# Patient Record
Sex: Female | Born: 1935 | Race: White | Hispanic: No | Marital: Married | Smoking: Former smoker
Health system: Southern US, Community
[De-identification: ages and names within clinical notes are randomized; demographics above are authoritative.]

## PROBLEM LIST (undated history)

## (undated) DIAGNOSIS — I422 Other hypertrophic cardiomyopathy: Secondary | ICD-10-CM

## (undated) DIAGNOSIS — Z8489 Family history of other specified conditions: Secondary | ICD-10-CM

## (undated) DIAGNOSIS — Z8739 Personal history of other diseases of the musculoskeletal system and connective tissue: Secondary | ICD-10-CM

## (undated) DIAGNOSIS — J42 Unspecified chronic bronchitis: Secondary | ICD-10-CM

## (undated) DIAGNOSIS — K602 Anal fissure, unspecified: Secondary | ICD-10-CM

## (undated) DIAGNOSIS — I509 Heart failure, unspecified: Secondary | ICD-10-CM

## (undated) DIAGNOSIS — I452 Bifascicular block: Secondary | ICD-10-CM

## (undated) DIAGNOSIS — N183 Chronic kidney disease, stage 3 unspecified: Secondary | ICD-10-CM

## (undated) DIAGNOSIS — F418 Other specified anxiety disorders: Secondary | ICD-10-CM

## (undated) DIAGNOSIS — I4891 Unspecified atrial fibrillation: Secondary | ICD-10-CM

## (undated) DIAGNOSIS — E785 Hyperlipidemia, unspecified: Secondary | ICD-10-CM

## (undated) DIAGNOSIS — E039 Hypothyroidism, unspecified: Secondary | ICD-10-CM

## (undated) DIAGNOSIS — I8390 Asymptomatic varicose veins of unspecified lower extremity: Secondary | ICD-10-CM

## (undated) DIAGNOSIS — Z95810 Presence of automatic (implantable) cardiac defibrillator: Secondary | ICD-10-CM

## (undated) DIAGNOSIS — C4491 Basal cell carcinoma of skin, unspecified: Secondary | ICD-10-CM

## (undated) DIAGNOSIS — I10 Essential (primary) hypertension: Secondary | ICD-10-CM

## (undated) DIAGNOSIS — D126 Benign neoplasm of colon, unspecified: Secondary | ICD-10-CM

## (undated) DIAGNOSIS — R001 Bradycardia, unspecified: Secondary | ICD-10-CM

## (undated) DIAGNOSIS — J690 Pneumonitis due to inhalation of food and vomit: Secondary | ICD-10-CM

## (undated) DIAGNOSIS — K5790 Diverticulosis of intestine, part unspecified, without perforation or abscess without bleeding: Secondary | ICD-10-CM

## (undated) DIAGNOSIS — G43909 Migraine, unspecified, not intractable, without status migrainosus: Secondary | ICD-10-CM

## (undated) HISTORY — DX: Benign neoplasm of colon, unspecified: D12.6

## (undated) HISTORY — DX: Unspecified atrial fibrillation: I48.91

## (undated) HISTORY — DX: Hyperlipidemia, unspecified: E78.5

## (undated) HISTORY — PX: COLONOSCOPY: SHX174

## (undated) HISTORY — DX: Presence of automatic (implantable) cardiac defibrillator: Z95.810

## (undated) HISTORY — PX: CATARACT EXTRACTION W/ INTRAOCULAR LENS  IMPLANT, BILATERAL: SHX1307

## (undated) HISTORY — DX: Hypothyroidism, unspecified: E03.9

## (undated) HISTORY — DX: Bradycardia, unspecified: R00.1

## (undated) HISTORY — DX: Essential (primary) hypertension: I10

## (undated) HISTORY — DX: Migraine, unspecified, not intractable, without status migrainosus: G43.909

## (undated) HISTORY — DX: Diverticulosis of intestine, part unspecified, without perforation or abscess without bleeding: K57.90

## (undated) HISTORY — PX: VAGINAL HYSTERECTOMY: SUR661

## (undated) HISTORY — DX: Anal fissure, unspecified: K60.2

## (undated) HISTORY — PX: CARDIOVERSION: SHX1299

## (undated) HISTORY — DX: Other hypertrophic cardiomyopathy: I42.2

---

## 1987-01-13 HISTORY — PX: OVARY SURGERY: SHX727

## 1997-11-21 ENCOUNTER — Encounter: Payer: Self-pay | Admitting: Internal Medicine

## 1997-11-22 ENCOUNTER — Other Ambulatory Visit: Admission: RE | Admit: 1997-11-22 | Discharge: 1997-11-22 | Payer: Self-pay | Admitting: Obstetrics and Gynecology

## 1998-04-05 ENCOUNTER — Ambulatory Visit (HOSPITAL_COMMUNITY): Admission: RE | Admit: 1998-04-05 | Discharge: 1998-04-05 | Payer: Self-pay | Admitting: Cardiology

## 1998-05-30 ENCOUNTER — Ambulatory Visit (HOSPITAL_COMMUNITY): Admission: RE | Admit: 1998-05-30 | Discharge: 1998-05-30 | Payer: Self-pay | Admitting: Cardiology

## 1998-07-23 ENCOUNTER — Ambulatory Visit (HOSPITAL_COMMUNITY): Admission: RE | Admit: 1998-07-23 | Discharge: 1998-07-23 | Payer: Self-pay | Admitting: Cardiology

## 1998-09-02 ENCOUNTER — Ambulatory Visit (HOSPITAL_COMMUNITY): Admission: RE | Admit: 1998-09-02 | Discharge: 1998-09-02 | Payer: Self-pay | Admitting: Cardiology

## 2000-10-26 ENCOUNTER — Encounter: Payer: Self-pay | Admitting: Cardiology

## 2000-10-26 ENCOUNTER — Ambulatory Visit (HOSPITAL_COMMUNITY): Admission: RE | Admit: 2000-10-26 | Discharge: 2000-10-26 | Payer: Self-pay | Admitting: Cardiology

## 2000-11-22 ENCOUNTER — Encounter: Payer: Self-pay | Admitting: Internal Medicine

## 2000-12-07 ENCOUNTER — Encounter (INDEPENDENT_AMBULATORY_CARE_PROVIDER_SITE_OTHER): Payer: Self-pay

## 2000-12-07 ENCOUNTER — Encounter: Payer: Self-pay | Admitting: Internal Medicine

## 2000-12-07 ENCOUNTER — Ambulatory Visit (HOSPITAL_COMMUNITY): Admission: RE | Admit: 2000-12-07 | Discharge: 2000-12-07 | Payer: Self-pay | Admitting: Gastroenterology

## 2001-03-15 ENCOUNTER — Ambulatory Visit (HOSPITAL_COMMUNITY): Admission: RE | Admit: 2001-03-15 | Discharge: 2001-03-15 | Payer: Self-pay | Admitting: Internal Medicine

## 2001-05-29 ENCOUNTER — Encounter: Payer: Self-pay | Admitting: Emergency Medicine

## 2001-05-29 ENCOUNTER — Inpatient Hospital Stay (HOSPITAL_COMMUNITY): Admission: EM | Admit: 2001-05-29 | Discharge: 2001-05-30 | Payer: Self-pay | Admitting: Emergency Medicine

## 2002-02-22 ENCOUNTER — Inpatient Hospital Stay (HOSPITAL_COMMUNITY): Admission: EM | Admit: 2002-02-22 | Discharge: 2002-02-26 | Payer: Self-pay | Admitting: Emergency Medicine

## 2002-02-22 ENCOUNTER — Encounter: Payer: Self-pay | Admitting: Emergency Medicine

## 2002-02-23 ENCOUNTER — Encounter (INDEPENDENT_AMBULATORY_CARE_PROVIDER_SITE_OTHER): Payer: Self-pay | Admitting: *Deleted

## 2002-02-26 ENCOUNTER — Encounter: Payer: Self-pay | Admitting: Cardiology

## 2002-02-28 ENCOUNTER — Inpatient Hospital Stay (HOSPITAL_COMMUNITY): Admission: EM | Admit: 2002-02-28 | Discharge: 2002-03-02 | Payer: Self-pay | Admitting: Emergency Medicine

## 2002-02-28 ENCOUNTER — Encounter: Payer: Self-pay | Admitting: Emergency Medicine

## 2003-01-03 ENCOUNTER — Encounter: Admission: RE | Admit: 2003-01-03 | Discharge: 2003-01-03 | Payer: Self-pay | Admitting: Internal Medicine

## 2003-09-14 ENCOUNTER — Emergency Department (HOSPITAL_COMMUNITY): Admission: EM | Admit: 2003-09-14 | Discharge: 2003-09-14 | Payer: Self-pay | Admitting: Emergency Medicine

## 2003-11-19 ENCOUNTER — Ambulatory Visit: Payer: Self-pay | Admitting: Internal Medicine

## 2003-12-10 ENCOUNTER — Encounter: Payer: Self-pay | Admitting: Internal Medicine

## 2004-01-21 ENCOUNTER — Ambulatory Visit: Payer: Self-pay | Admitting: Internal Medicine

## 2004-04-22 ENCOUNTER — Ambulatory Visit: Payer: Self-pay | Admitting: Internal Medicine

## 2004-05-23 ENCOUNTER — Ambulatory Visit: Payer: Self-pay | Admitting: Internal Medicine

## 2004-08-25 ENCOUNTER — Ambulatory Visit: Payer: Self-pay | Admitting: Internal Medicine

## 2004-10-27 ENCOUNTER — Ambulatory Visit: Payer: Self-pay | Admitting: Internal Medicine

## 2005-01-12 HISTORY — PX: CARDIAC DEFIBRILLATOR PLACEMENT: SHX171

## 2005-01-27 ENCOUNTER — Ambulatory Visit: Payer: Self-pay | Admitting: Internal Medicine

## 2005-02-03 ENCOUNTER — Ambulatory Visit (HOSPITAL_COMMUNITY): Admission: RE | Admit: 2005-02-03 | Discharge: 2005-02-03 | Payer: Self-pay | Admitting: Internal Medicine

## 2005-04-28 ENCOUNTER — Ambulatory Visit: Payer: Self-pay | Admitting: Internal Medicine

## 2005-06-23 ENCOUNTER — Ambulatory Visit: Payer: Self-pay | Admitting: Internal Medicine

## 2005-09-22 ENCOUNTER — Ambulatory Visit: Payer: Self-pay | Admitting: Internal Medicine

## 2005-10-14 ENCOUNTER — Ambulatory Visit: Payer: Self-pay | Admitting: Internal Medicine

## 2005-10-26 ENCOUNTER — Ambulatory Visit: Payer: Self-pay | Admitting: Internal Medicine

## 2005-11-04 ENCOUNTER — Ambulatory Visit: Payer: Self-pay | Admitting: Internal Medicine

## 2005-11-04 LAB — CONVERTED CEMR LAB
BUN: 20 mg/dL (ref 6–23)
Calcium: 9.6 mg/dL (ref 8.4–10.5)
Chloride: 104 meq/L (ref 96–112)
Creatinine, Ser: 1.3 mg/dL — ABNORMAL HIGH (ref 0.4–1.2)
GFR calc non Af Amer: 43 mL/min
Glomerular Filtration Rate, Af Am: 52 mL/min/{1.73_m2}
Glucose, Bld: 72 mg/dL (ref 70–99)
HCT: 41 % (ref 36.0–46.0)
Hemoglobin: 13.5 g/dL (ref 12.0–15.0)
INR: 2.3 — ABNORMAL HIGH (ref 0.9–2.0)
Prothrombin Time: 19.2 s — ABNORMAL HIGH (ref 10.0–14.0)
Sodium: 139 meq/L (ref 135–145)
WBC: 6 10*3/uL (ref 4.5–10.5)

## 2005-11-12 ENCOUNTER — Inpatient Hospital Stay (HOSPITAL_COMMUNITY): Admission: AD | Admit: 2005-11-12 | Discharge: 2005-11-14 | Payer: Self-pay | Admitting: Internal Medicine

## 2005-11-12 ENCOUNTER — Ambulatory Visit: Payer: Self-pay | Admitting: Internal Medicine

## 2005-11-16 ENCOUNTER — Ambulatory Visit: Payer: Self-pay

## 2005-11-23 ENCOUNTER — Ambulatory Visit: Payer: Self-pay | Admitting: Internal Medicine

## 2005-11-23 ENCOUNTER — Ambulatory Visit: Payer: Self-pay

## 2005-11-23 ENCOUNTER — Encounter: Payer: Self-pay | Admitting: Cardiology

## 2005-11-26 ENCOUNTER — Ambulatory Visit: Payer: Self-pay | Admitting: Internal Medicine

## 2005-12-17 ENCOUNTER — Ambulatory Visit: Payer: Self-pay | Admitting: Internal Medicine

## 2005-12-22 ENCOUNTER — Ambulatory Visit: Payer: Self-pay | Admitting: Internal Medicine

## 2005-12-31 ENCOUNTER — Ambulatory Visit: Payer: Self-pay

## 2006-01-04 ENCOUNTER — Ambulatory Visit: Payer: Self-pay | Admitting: Internal Medicine

## 2006-01-04 ENCOUNTER — Ambulatory Visit (HOSPITAL_COMMUNITY): Admission: RE | Admit: 2006-01-04 | Discharge: 2006-01-04 | Payer: Self-pay | Admitting: Internal Medicine

## 2006-01-15 ENCOUNTER — Ambulatory Visit: Payer: Self-pay | Admitting: Internal Medicine

## 2006-01-25 ENCOUNTER — Ambulatory Visit: Payer: Self-pay | Admitting: Internal Medicine

## 2006-02-11 ENCOUNTER — Ambulatory Visit: Payer: Self-pay | Admitting: Internal Medicine

## 2006-02-11 LAB — CONVERTED CEMR LAB
BUN: 21 mg/dL (ref 6–23)
Basophils Relative: 0.5 % (ref 0.0–1.0)
Calcium: 9.5 mg/dL (ref 8.4–10.5)
Chloride: 104 meq/L (ref 96–112)
Eosinophils Absolute: 0.1 10*3/uL (ref 0.0–0.6)
Eosinophils Relative: 2 % (ref 0.0–5.0)
Hemoglobin: 13 g/dL (ref 12.0–15.0)
Lymphocytes Relative: 29.1 % (ref 12.0–46.0)
MCV: 86.4 fL (ref 78.0–100.0)
Monocytes Absolute: 0.6 10*3/uL (ref 0.2–0.7)
Monocytes Relative: 8.7 % (ref 3.0–11.0)
Neutro Abs: 4.2 10*3/uL (ref 1.4–7.7)
Platelets: 191 10*3/uL (ref 150–400)
Potassium: 4.3 meq/L (ref 3.5–5.1)
Sodium: 138 meq/L (ref 135–145)
aPTT: 30.1 s (ref 26.5–36.5)

## 2006-02-16 ENCOUNTER — Ambulatory Visit: Payer: Self-pay | Admitting: Internal Medicine

## 2006-02-22 ENCOUNTER — Ambulatory Visit (HOSPITAL_COMMUNITY): Admission: RE | Admit: 2006-02-22 | Discharge: 2006-02-22 | Payer: Self-pay | Admitting: Internal Medicine

## 2006-02-22 ENCOUNTER — Ambulatory Visit: Payer: Self-pay | Admitting: Internal Medicine

## 2006-03-02 ENCOUNTER — Ambulatory Visit: Payer: Self-pay | Admitting: Internal Medicine

## 2006-03-02 LAB — CONVERTED CEMR LAB: TSH: 0.32 microintl units/mL — ABNORMAL LOW (ref 0.35–5.50)

## 2006-03-08 ENCOUNTER — Ambulatory Visit: Payer: Self-pay

## 2006-03-16 ENCOUNTER — Ambulatory Visit: Payer: Self-pay | Admitting: Internal Medicine

## 2006-03-17 ENCOUNTER — Ambulatory Visit: Payer: Self-pay

## 2006-03-31 ENCOUNTER — Ambulatory Visit: Payer: Self-pay | Admitting: Internal Medicine

## 2006-04-12 ENCOUNTER — Ambulatory Visit: Payer: Self-pay | Admitting: Internal Medicine

## 2006-04-14 ENCOUNTER — Ambulatory Visit: Payer: Self-pay | Admitting: Internal Medicine

## 2006-04-28 ENCOUNTER — Ambulatory Visit: Payer: Self-pay | Admitting: Internal Medicine

## 2006-05-10 ENCOUNTER — Ambulatory Visit: Payer: Self-pay

## 2006-05-10 ENCOUNTER — Ambulatory Visit: Payer: Self-pay | Admitting: Internal Medicine

## 2006-05-13 ENCOUNTER — Ambulatory Visit: Payer: Self-pay | Admitting: Internal Medicine

## 2006-05-26 ENCOUNTER — Ambulatory Visit: Payer: Self-pay | Admitting: Internal Medicine

## 2006-06-02 ENCOUNTER — Ambulatory Visit: Payer: Self-pay | Admitting: Internal Medicine

## 2006-06-16 ENCOUNTER — Ambulatory Visit: Payer: Self-pay | Admitting: Internal Medicine

## 2006-06-25 ENCOUNTER — Ambulatory Visit: Payer: Self-pay | Admitting: Internal Medicine

## 2006-07-08 DIAGNOSIS — E785 Hyperlipidemia, unspecified: Secondary | ICD-10-CM | POA: Insufficient documentation

## 2006-07-08 DIAGNOSIS — I4891 Unspecified atrial fibrillation: Secondary | ICD-10-CM

## 2006-07-08 DIAGNOSIS — E039 Hypothyroidism, unspecified: Secondary | ICD-10-CM | POA: Insufficient documentation

## 2006-07-12 ENCOUNTER — Ambulatory Visit: Payer: Self-pay | Admitting: Internal Medicine

## 2006-07-22 ENCOUNTER — Ambulatory Visit: Payer: Self-pay | Admitting: Internal Medicine

## 2006-08-18 ENCOUNTER — Telehealth: Payer: Self-pay | Admitting: *Deleted

## 2006-08-24 ENCOUNTER — Ambulatory Visit: Payer: Self-pay | Admitting: Internal Medicine

## 2006-08-24 LAB — CONVERTED CEMR LAB
INR: 2
Prothrombin Time: 17.5 s

## 2006-09-23 ENCOUNTER — Ambulatory Visit: Payer: Self-pay | Admitting: Internal Medicine

## 2006-09-23 LAB — CONVERTED CEMR LAB: Prothrombin Time: 14.8 s

## 2006-10-20 ENCOUNTER — Ambulatory Visit: Payer: Self-pay | Admitting: Internal Medicine

## 2006-11-09 ENCOUNTER — Ambulatory Visit: Payer: Self-pay | Admitting: Internal Medicine

## 2006-11-19 ENCOUNTER — Ambulatory Visit: Payer: Self-pay | Admitting: Internal Medicine

## 2006-11-19 LAB — CONVERTED CEMR LAB: INR: 2.8

## 2006-11-22 ENCOUNTER — Ambulatory Visit: Payer: Self-pay

## 2006-12-07 ENCOUNTER — Encounter: Payer: Self-pay | Admitting: Internal Medicine

## 2006-12-15 ENCOUNTER — Ambulatory Visit: Payer: Self-pay

## 2006-12-17 ENCOUNTER — Ambulatory Visit: Payer: Self-pay | Admitting: Internal Medicine

## 2006-12-17 LAB — CONVERTED CEMR LAB
INR: 4
Prothrombin Time: 24.2 s

## 2006-12-28 ENCOUNTER — Ambulatory Visit: Payer: Self-pay | Admitting: Internal Medicine

## 2006-12-28 LAB — CONVERTED CEMR LAB
BUN: 17 mg/dL (ref 6–23)
CO2: 27 meq/L (ref 19–32)
Calcium: 9.4 mg/dL (ref 8.4–10.5)
Creatinine, Ser: 1.3 mg/dL — ABNORMAL HIGH (ref 0.4–1.2)
Glucose, Bld: 95 mg/dL (ref 70–99)
Sodium: 140 meq/L (ref 135–145)

## 2006-12-30 ENCOUNTER — Ambulatory Visit: Payer: Self-pay | Admitting: Internal Medicine

## 2006-12-30 LAB — CONVERTED CEMR LAB: Prothrombin Time: 19.7 s

## 2007-01-20 ENCOUNTER — Ambulatory Visit: Payer: Self-pay | Admitting: Internal Medicine

## 2007-01-20 LAB — CONVERTED CEMR LAB
INR: 1.9
Prothrombin Time: 17.1 s

## 2007-02-08 ENCOUNTER — Ambulatory Visit: Payer: Self-pay | Admitting: Internal Medicine

## 2007-02-08 DIAGNOSIS — I421 Obstructive hypertrophic cardiomyopathy: Secondary | ICD-10-CM | POA: Insufficient documentation

## 2007-02-14 ENCOUNTER — Ambulatory Visit: Payer: Self-pay

## 2007-02-21 ENCOUNTER — Ambulatory Visit: Payer: Self-pay | Admitting: Internal Medicine

## 2007-03-07 ENCOUNTER — Ambulatory Visit: Payer: Self-pay | Admitting: Internal Medicine

## 2007-03-21 ENCOUNTER — Ambulatory Visit: Payer: Self-pay | Admitting: Internal Medicine

## 2007-04-18 ENCOUNTER — Ambulatory Visit: Payer: Self-pay | Admitting: Internal Medicine

## 2007-04-18 LAB — CONVERTED CEMR LAB
INR: 2.9
Prothrombin Time: 20.6 s

## 2007-04-26 ENCOUNTER — Ambulatory Visit: Payer: Self-pay | Admitting: Internal Medicine

## 2007-04-26 DIAGNOSIS — T887XXA Unspecified adverse effect of drug or medicament, initial encounter: Secondary | ICD-10-CM

## 2007-04-26 LAB — CONVERTED CEMR LAB
ALT: 21 units/L (ref 0–35)
AST: 28 units/L (ref 0–37)
Albumin: 3.7 g/dL (ref 3.5–5.2)
Alkaline Phosphatase: 45 units/L (ref 39–117)
Cholesterol: 257 mg/dL (ref 0–200)
Direct LDL: 181.2 mg/dL
TSH: 0.19 microintl units/mL — ABNORMAL LOW (ref 0.35–5.50)
Total Protein: 6 g/dL (ref 6.0–8.3)
VLDL: 24 mg/dL (ref 0–40)

## 2007-05-10 ENCOUNTER — Ambulatory Visit: Payer: Self-pay | Admitting: Internal Medicine

## 2007-05-10 LAB — CONVERTED CEMR LAB: HDL goal, serum: 40 mg/dL

## 2007-05-16 ENCOUNTER — Ambulatory Visit: Payer: Self-pay | Admitting: Internal Medicine

## 2007-05-18 ENCOUNTER — Ambulatory Visit: Payer: Self-pay

## 2007-06-07 ENCOUNTER — Telehealth: Payer: Self-pay | Admitting: Internal Medicine

## 2007-06-09 ENCOUNTER — Telehealth: Payer: Self-pay | Admitting: Internal Medicine

## 2007-06-10 ENCOUNTER — Ambulatory Visit: Payer: Self-pay | Admitting: Internal Medicine

## 2007-06-10 LAB — CONVERTED CEMR LAB: Prothrombin Time: 22.2 s

## 2007-06-14 ENCOUNTER — Telehealth: Payer: Self-pay | Admitting: Internal Medicine

## 2007-06-23 ENCOUNTER — Telehealth: Payer: Self-pay | Admitting: Internal Medicine

## 2007-06-24 ENCOUNTER — Ambulatory Visit: Payer: Self-pay | Admitting: Internal Medicine

## 2007-06-24 DIAGNOSIS — S7000XA Contusion of unspecified hip, initial encounter: Secondary | ICD-10-CM

## 2007-06-24 DIAGNOSIS — M1710 Unilateral primary osteoarthritis, unspecified knee: Secondary | ICD-10-CM | POA: Insufficient documentation

## 2007-07-04 ENCOUNTER — Ambulatory Visit: Payer: Self-pay | Admitting: Internal Medicine

## 2007-07-04 LAB — CONVERTED CEMR LAB
Albumin: 3.9 g/dL (ref 3.5–5.2)
Bilirubin, Direct: 0.1 mg/dL (ref 0.0–0.3)
Cholesterol: 253 mg/dL (ref 0–200)
HDL: 46.8 mg/dL (ref >39.0)
Total Bilirubin: 0.8 mg/dL (ref 0.3–1.2)
Triglycerides: 130 mg/dL (ref 0–149)
VLDL: 26 mg/dL (ref 0–40)

## 2007-07-08 ENCOUNTER — Ambulatory Visit: Payer: Self-pay | Admitting: Internal Medicine

## 2007-07-14 ENCOUNTER — Ambulatory Visit: Payer: Self-pay

## 2007-07-18 ENCOUNTER — Ambulatory Visit (HOSPITAL_COMMUNITY): Admission: RE | Admit: 2007-07-18 | Discharge: 2007-07-18 | Payer: Self-pay | Admitting: Internal Medicine

## 2007-07-18 ENCOUNTER — Ambulatory Visit: Payer: Self-pay | Admitting: Internal Medicine

## 2007-07-21 ENCOUNTER — Ambulatory Visit: Payer: Self-pay | Admitting: Internal Medicine

## 2007-08-04 ENCOUNTER — Ambulatory Visit: Payer: Self-pay

## 2007-08-12 ENCOUNTER — Ambulatory Visit: Payer: Self-pay | Admitting: Internal Medicine

## 2007-08-12 LAB — CONVERTED CEMR LAB: Prothrombin Time: 18.4 s

## 2007-08-20 ENCOUNTER — Ambulatory Visit: Payer: Self-pay | Admitting: Internal Medicine

## 2007-08-20 DIAGNOSIS — S40029A Contusion of unspecified upper arm, initial encounter: Secondary | ICD-10-CM

## 2007-09-01 ENCOUNTER — Ambulatory Visit: Payer: Self-pay | Admitting: Internal Medicine

## 2007-09-01 LAB — CONVERTED CEMR LAB
INR: 2.6
Prothrombin Time: 19.5 s

## 2007-09-22 ENCOUNTER — Telehealth: Payer: Self-pay | Admitting: Internal Medicine

## 2007-09-22 ENCOUNTER — Ambulatory Visit: Payer: Self-pay | Admitting: Family Medicine

## 2007-09-22 DIAGNOSIS — K5732 Diverticulitis of large intestine without perforation or abscess without bleeding: Secondary | ICD-10-CM | POA: Insufficient documentation

## 2007-09-22 DIAGNOSIS — S335XXA Sprain of ligaments of lumbar spine, initial encounter: Secondary | ICD-10-CM

## 2007-09-22 DIAGNOSIS — S339XXA Sprain of unspecified parts of lumbar spine and pelvis, initial encounter: Secondary | ICD-10-CM | POA: Insufficient documentation

## 2007-09-29 ENCOUNTER — Ambulatory Visit: Payer: Self-pay | Admitting: Internal Medicine

## 2007-09-29 LAB — CONVERTED CEMR LAB: INR: 2

## 2007-09-30 ENCOUNTER — Ambulatory Visit: Payer: Self-pay | Admitting: Internal Medicine

## 2007-10-03 ENCOUNTER — Ambulatory Visit: Payer: Self-pay | Admitting: Internal Medicine

## 2007-10-27 ENCOUNTER — Ambulatory Visit: Payer: Self-pay | Admitting: Internal Medicine

## 2007-11-03 ENCOUNTER — Ambulatory Visit: Payer: Self-pay | Admitting: Internal Medicine

## 2007-11-28 ENCOUNTER — Ambulatory Visit: Payer: Self-pay | Admitting: Internal Medicine

## 2007-12-27 ENCOUNTER — Ambulatory Visit: Payer: Self-pay | Admitting: Internal Medicine

## 2007-12-27 LAB — CONVERTED CEMR LAB
INR: 3.1
Prothrombin Time: 21.1 s

## 2008-01-27 ENCOUNTER — Ambulatory Visit: Payer: Self-pay | Admitting: Internal Medicine

## 2008-01-27 LAB — CONVERTED CEMR LAB: INR: 2.6

## 2008-01-30 ENCOUNTER — Encounter: Payer: Self-pay | Admitting: Internal Medicine

## 2008-01-30 ENCOUNTER — Ambulatory Visit: Payer: Self-pay

## 2008-02-24 ENCOUNTER — Ambulatory Visit: Payer: Self-pay | Admitting: Internal Medicine

## 2008-02-24 LAB — CONVERTED CEMR LAB
INR: 3.1
Prothrombin Time: 21.1 s

## 2008-03-01 ENCOUNTER — Ambulatory Visit: Payer: Self-pay | Admitting: Internal Medicine

## 2008-03-01 LAB — CONVERTED CEMR LAB: TSH: 0.19 microintl units/mL — ABNORMAL LOW (ref 0.35–5.50)

## 2008-03-23 ENCOUNTER — Ambulatory Visit: Payer: Self-pay | Admitting: Internal Medicine

## 2008-03-26 ENCOUNTER — Emergency Department (HOSPITAL_COMMUNITY): Admission: EM | Admit: 2008-03-26 | Discharge: 2008-03-26 | Payer: Self-pay | Admitting: Emergency Medicine

## 2008-03-26 ENCOUNTER — Ambulatory Visit: Payer: Self-pay | Admitting: Cardiology

## 2008-04-12 ENCOUNTER — Ambulatory Visit: Payer: Self-pay

## 2008-04-23 ENCOUNTER — Ambulatory Visit: Payer: Self-pay | Admitting: Internal Medicine

## 2008-04-23 LAB — CONVERTED CEMR LAB: INR: 3.8

## 2008-04-26 ENCOUNTER — Telehealth: Payer: Self-pay | Admitting: Internal Medicine

## 2008-04-27 ENCOUNTER — Ambulatory Visit: Payer: Self-pay | Admitting: Internal Medicine

## 2008-05-21 ENCOUNTER — Ambulatory Visit: Payer: Self-pay | Admitting: Internal Medicine

## 2008-05-21 LAB — CONVERTED CEMR LAB: Prothrombin Time: 23.5 s

## 2008-05-31 ENCOUNTER — Ambulatory Visit: Payer: Self-pay | Admitting: Internal Medicine

## 2008-05-31 LAB — CONVERTED CEMR LAB
Direct LDL: 192.5 mg/dL
HDL: 42.3 mg/dL (ref >39.00)

## 2008-06-06 ENCOUNTER — Encounter: Payer: Self-pay | Admitting: Internal Medicine

## 2008-06-12 ENCOUNTER — Ambulatory Visit: Payer: Self-pay | Admitting: Internal Medicine

## 2008-07-06 ENCOUNTER — Encounter: Payer: Self-pay | Admitting: Internal Medicine

## 2008-07-10 ENCOUNTER — Ambulatory Visit: Payer: Self-pay | Admitting: Internal Medicine

## 2008-07-12 ENCOUNTER — Encounter: Payer: Self-pay | Admitting: Internal Medicine

## 2008-07-12 ENCOUNTER — Ambulatory Visit: Payer: Self-pay | Admitting: Internal Medicine

## 2008-08-10 ENCOUNTER — Ambulatory Visit: Payer: Self-pay | Admitting: Internal Medicine

## 2008-08-10 LAB — CONVERTED CEMR LAB: INR: 2.1

## 2008-09-04 ENCOUNTER — Ambulatory Visit: Payer: Self-pay | Admitting: Internal Medicine

## 2008-09-04 LAB — CONVERTED CEMR LAB
Basophils Relative: 0.9 % (ref 0.0–3.0)
Eosinophils Absolute: 0.1 10*3/uL (ref 0.0–0.7)
Eosinophils Relative: 1.7 % (ref 0.0–5.0)
Free T4: 1.3 ng/dL (ref 0.6–1.6)
Lymphs Abs: 1.6 10*3/uL (ref 0.7–4.0)
Monocytes Absolute: 0.7 10*3/uL (ref 0.1–1.0)
Monocytes Relative: 8.9 % (ref 3.0–12.0)
Neutro Abs: 5.9 10*3/uL (ref 1.4–7.7)
Neutrophils Relative %: 69 % (ref 43.0–77.0)
Prothrombin Time: 20.7 s
RBC: 4.36 M/uL (ref 3.87–5.11)
WBC: 8.4 10*3/uL (ref 4.5–10.5)

## 2008-09-10 ENCOUNTER — Ambulatory Visit: Payer: Self-pay | Admitting: Internal Medicine

## 2008-09-10 DIAGNOSIS — Z86010 Personal history of colonic polyps: Secondary | ICD-10-CM

## 2008-10-03 ENCOUNTER — Ambulatory Visit: Payer: Self-pay | Admitting: Internal Medicine

## 2008-10-03 LAB — CONVERTED CEMR LAB
INR: 2.6
Prothrombin Time: 19.5 s

## 2008-10-08 ENCOUNTER — Telehealth: Payer: Self-pay | Admitting: Internal Medicine

## 2008-10-15 ENCOUNTER — Telehealth: Payer: Self-pay | Admitting: Internal Medicine

## 2008-10-15 ENCOUNTER — Ambulatory Visit: Payer: Self-pay

## 2008-10-15 ENCOUNTER — Telehealth (INDEPENDENT_AMBULATORY_CARE_PROVIDER_SITE_OTHER): Payer: Self-pay | Admitting: Nurse Practitioner

## 2008-10-15 ENCOUNTER — Encounter: Payer: Self-pay | Admitting: Internal Medicine

## 2008-10-17 ENCOUNTER — Encounter: Payer: Self-pay | Admitting: Internal Medicine

## 2008-10-17 ENCOUNTER — Telehealth: Payer: Self-pay | Admitting: Internal Medicine

## 2008-10-18 ENCOUNTER — Ambulatory Visit (HOSPITAL_COMMUNITY): Admission: RE | Admit: 2008-10-18 | Discharge: 2008-10-18 | Payer: Self-pay | Admitting: Internal Medicine

## 2008-10-18 ENCOUNTER — Ambulatory Visit: Payer: Self-pay | Admitting: Internal Medicine

## 2008-10-29 ENCOUNTER — Ambulatory Visit: Payer: Self-pay | Admitting: Internal Medicine

## 2008-10-29 ENCOUNTER — Telehealth: Payer: Self-pay | Admitting: Internal Medicine

## 2008-10-30 ENCOUNTER — Ambulatory Visit: Payer: Self-pay | Admitting: Internal Medicine

## 2008-10-30 DIAGNOSIS — J45991 Cough variant asthma: Secondary | ICD-10-CM

## 2008-11-20 ENCOUNTER — Ambulatory Visit: Payer: Self-pay | Admitting: Internal Medicine

## 2008-11-20 DIAGNOSIS — I5022 Chronic systolic (congestive) heart failure: Secondary | ICD-10-CM

## 2008-12-10 ENCOUNTER — Ambulatory Visit: Payer: Self-pay | Admitting: Internal Medicine

## 2008-12-10 LAB — CONVERTED CEMR LAB
INR: 2.9
Prothrombin Time: 20.7 s

## 2008-12-11 ENCOUNTER — Telehealth: Payer: Self-pay | Admitting: Internal Medicine

## 2008-12-12 ENCOUNTER — Encounter (INDEPENDENT_AMBULATORY_CARE_PROVIDER_SITE_OTHER): Payer: Self-pay

## 2008-12-14 ENCOUNTER — Encounter (INDEPENDENT_AMBULATORY_CARE_PROVIDER_SITE_OTHER): Payer: Self-pay | Admitting: *Deleted

## 2008-12-20 ENCOUNTER — Telehealth: Payer: Self-pay | Admitting: Internal Medicine

## 2008-12-24 ENCOUNTER — Ambulatory Visit: Payer: Self-pay | Admitting: Cardiovascular Disease

## 2008-12-24 ENCOUNTER — Encounter (INDEPENDENT_AMBULATORY_CARE_PROVIDER_SITE_OTHER): Payer: Self-pay | Admitting: Cardiology

## 2008-12-24 ENCOUNTER — Ambulatory Visit: Payer: Self-pay

## 2008-12-24 ENCOUNTER — Telehealth: Payer: Self-pay | Admitting: Internal Medicine

## 2008-12-24 ENCOUNTER — Encounter: Payer: Self-pay | Admitting: Internal Medicine

## 2008-12-24 LAB — CONVERTED CEMR LAB
Basophils Absolute: 0 10*3/uL (ref 0.0–0.1)
Basophils Relative: 0.6 % (ref 0.0–3.0)
Creatinine, Ser: 1.6 mg/dL — ABNORMAL HIGH (ref 0.4–1.2)
Eosinophils Absolute: 0.2 10*3/uL (ref 0.0–0.7)
Hemoglobin: 12.6 g/dL (ref 12.0–15.0)
Lymphs Abs: 1.5 10*3/uL (ref 0.7–4.0)
MCV: 88.9 fL (ref 78.0–100.0)
POC INR: 2.8
Prothrombin Time: 28.2 s — ABNORMAL HIGH (ref 9.1–11.7)
RDW: 13.6 % (ref 11.5–14.6)
Sodium: 143 meq/L (ref 135–145)
WBC: 6.2 10*3/uL (ref 4.5–10.5)

## 2008-12-27 ENCOUNTER — Ambulatory Visit (HOSPITAL_COMMUNITY): Admission: RE | Admit: 2008-12-27 | Discharge: 2008-12-27 | Payer: Self-pay | Admitting: Internal Medicine

## 2008-12-27 ENCOUNTER — Ambulatory Visit: Payer: Self-pay | Admitting: Internal Medicine

## 2009-01-09 ENCOUNTER — Ambulatory Visit: Payer: Self-pay | Admitting: Internal Medicine

## 2009-01-17 ENCOUNTER — Encounter (INDEPENDENT_AMBULATORY_CARE_PROVIDER_SITE_OTHER): Payer: Self-pay | Admitting: *Deleted

## 2009-01-21 ENCOUNTER — Encounter (INDEPENDENT_AMBULATORY_CARE_PROVIDER_SITE_OTHER): Payer: Self-pay

## 2009-01-21 ENCOUNTER — Ambulatory Visit: Payer: Self-pay | Admitting: Internal Medicine

## 2009-01-28 ENCOUNTER — Telehealth: Payer: Self-pay | Admitting: Internal Medicine

## 2009-02-02 ENCOUNTER — Encounter: Payer: Self-pay | Admitting: Internal Medicine

## 2009-02-02 ENCOUNTER — Ambulatory Visit: Payer: Self-pay | Admitting: Family Medicine

## 2009-02-12 ENCOUNTER — Ambulatory Visit: Payer: Self-pay | Admitting: Internal Medicine

## 2009-02-18 ENCOUNTER — Encounter: Payer: Self-pay | Admitting: Internal Medicine

## 2009-02-18 ENCOUNTER — Ambulatory Visit: Payer: Self-pay

## 2009-03-04 ENCOUNTER — Telehealth: Payer: Self-pay | Admitting: Internal Medicine

## 2009-03-04 ENCOUNTER — Ambulatory Visit: Payer: Self-pay | Admitting: Internal Medicine

## 2009-03-05 ENCOUNTER — Ambulatory Visit: Payer: Self-pay | Admitting: Internal Medicine

## 2009-03-05 ENCOUNTER — Inpatient Hospital Stay (HOSPITAL_COMMUNITY): Admission: AD | Admit: 2009-03-05 | Discharge: 2009-03-08 | Payer: Self-pay | Admitting: Internal Medicine

## 2009-03-08 ENCOUNTER — Telehealth: Payer: Self-pay | Admitting: Internal Medicine

## 2009-03-12 ENCOUNTER — Ambulatory Visit: Payer: Self-pay | Admitting: Internal Medicine

## 2009-03-12 ENCOUNTER — Telehealth (INDEPENDENT_AMBULATORY_CARE_PROVIDER_SITE_OTHER): Payer: Self-pay | Admitting: *Deleted

## 2009-03-12 LAB — CONVERTED CEMR LAB
INR: 2.1
Prothrombin Time: 17.8 s

## 2009-04-09 ENCOUNTER — Ambulatory Visit: Payer: Self-pay | Admitting: Internal Medicine

## 2009-04-30 ENCOUNTER — Ambulatory Visit: Payer: Self-pay | Admitting: Internal Medicine

## 2009-04-30 DIAGNOSIS — I4949 Other premature depolarization: Secondary | ICD-10-CM | POA: Insufficient documentation

## 2009-05-01 LAB — CONVERTED CEMR LAB
BUN: 21 mg/dL (ref 6–23)
CO2: 27 meq/L (ref 19–32)
Creatinine, Ser: 1.1 mg/dL (ref 0.4–1.2)
GFR calc non Af Amer: 51.43 mL/min (ref >60)

## 2009-05-07 ENCOUNTER — Ambulatory Visit: Payer: Self-pay | Admitting: Internal Medicine

## 2009-05-21 ENCOUNTER — Ambulatory Visit: Payer: Self-pay | Admitting: Internal Medicine

## 2009-05-21 LAB — CONVERTED CEMR LAB: Prothrombin Time: 20.7 s

## 2009-06-12 ENCOUNTER — Encounter: Payer: Self-pay | Admitting: Internal Medicine

## 2009-06-18 ENCOUNTER — Telehealth: Payer: Self-pay | Admitting: Internal Medicine

## 2009-06-24 ENCOUNTER — Ambulatory Visit: Payer: Self-pay | Admitting: Internal Medicine

## 2009-06-24 ENCOUNTER — Telehealth: Payer: Self-pay | Admitting: Internal Medicine

## 2009-06-24 ENCOUNTER — Ambulatory Visit: Payer: Self-pay | Admitting: Cardiology

## 2009-06-24 DIAGNOSIS — I959 Hypotension, unspecified: Secondary | ICD-10-CM

## 2009-06-24 LAB — CONVERTED CEMR LAB
Alkaline Phosphatase: 50 units/L (ref 39–117)
Basophils Absolute: 0 10*3/uL (ref 0.0–0.1)
Bilirubin, Direct: 0.1 mg/dL (ref 0.0–0.3)
CO2: 26 meq/L (ref 19–32)
Eosinophils Relative: 1.7 % (ref 0.0–5.0)
GFR calc non Af Amer: 39.25 mL/min (ref >60)
Glucose, Bld: 96 mg/dL (ref 70–99)
HCT: 31.7 % — ABNORMAL LOW (ref 36.0–46.0)
Hemoglobin: 10.7 g/dL — ABNORMAL LOW (ref 12.0–15.0)
LDL Cholesterol: 124 mg/dL — ABNORMAL HIGH (ref 0–99)
MCHC: 33.6 g/dL (ref 30.0–36.0)
MCV: 86.5 fL (ref 78.0–100.0)
Monocytes Relative: 8.1 % (ref 3.0–12.0)
Neutrophils Relative %: 74.3 % (ref 43.0–77.0)
Potassium: 4.2 meq/L (ref 3.5–5.1)
RBC: 3.67 M/uL — ABNORMAL LOW (ref 3.87–5.11)
Sodium: 143 meq/L (ref 135–145)
Total Bilirubin: 0.6 mg/dL (ref 0.3–1.2)
Total CHOL/HDL Ratio: 5
VLDL: 26 mg/dL (ref 0.0–40.0)

## 2009-06-25 ENCOUNTER — Ambulatory Visit: Payer: Self-pay | Admitting: Cardiology

## 2009-06-25 ENCOUNTER — Encounter: Payer: Self-pay | Admitting: Internal Medicine

## 2009-06-25 ENCOUNTER — Ambulatory Visit (HOSPITAL_COMMUNITY): Admission: RE | Admit: 2009-06-25 | Discharge: 2009-06-25 | Payer: Self-pay | Admitting: Cardiology

## 2009-06-26 ENCOUNTER — Ambulatory Visit: Payer: Self-pay | Admitting: Internal Medicine

## 2009-06-28 ENCOUNTER — Telehealth: Payer: Self-pay | Admitting: Internal Medicine

## 2009-07-01 ENCOUNTER — Ambulatory Visit: Payer: Self-pay | Admitting: Internal Medicine

## 2009-07-11 ENCOUNTER — Ambulatory Visit: Payer: Self-pay | Admitting: Internal Medicine

## 2009-07-22 ENCOUNTER — Ambulatory Visit: Payer: Self-pay | Admitting: Internal Medicine

## 2009-07-22 LAB — CONVERTED CEMR LAB: INR: 1.9

## 2009-07-29 ENCOUNTER — Telehealth: Payer: Self-pay | Admitting: Internal Medicine

## 2009-08-01 ENCOUNTER — Ambulatory Visit: Payer: Self-pay

## 2009-08-01 ENCOUNTER — Encounter: Payer: Self-pay | Admitting: Internal Medicine

## 2009-08-20 ENCOUNTER — Ambulatory Visit: Payer: Self-pay | Admitting: Internal Medicine

## 2009-08-20 LAB — CONVERTED CEMR LAB: INR: 1.8

## 2009-09-06 ENCOUNTER — Ambulatory Visit: Payer: Self-pay | Admitting: Family Medicine

## 2009-09-18 ENCOUNTER — Ambulatory Visit: Payer: Self-pay | Admitting: Internal Medicine

## 2009-09-18 LAB — CONVERTED CEMR LAB: INR: 2.4

## 2009-09-20 ENCOUNTER — Encounter: Payer: Self-pay | Admitting: Family Medicine

## 2009-09-24 ENCOUNTER — Encounter: Payer: Self-pay | Admitting: Internal Medicine

## 2009-09-30 ENCOUNTER — Ambulatory Visit: Payer: Self-pay | Admitting: Internal Medicine

## 2009-10-17 ENCOUNTER — Ambulatory Visit: Payer: Self-pay | Admitting: Internal Medicine

## 2009-10-17 ENCOUNTER — Ambulatory Visit: Payer: Self-pay

## 2009-10-17 ENCOUNTER — Encounter: Payer: Self-pay | Admitting: Internal Medicine

## 2009-11-12 ENCOUNTER — Ambulatory Visit: Payer: Self-pay | Admitting: Internal Medicine

## 2009-11-12 LAB — CONVERTED CEMR LAB: INR: 2.8

## 2009-11-19 ENCOUNTER — Telehealth (INDEPENDENT_AMBULATORY_CARE_PROVIDER_SITE_OTHER): Payer: Self-pay | Admitting: *Deleted

## 2009-11-22 ENCOUNTER — Encounter: Payer: Self-pay | Admitting: Internal Medicine

## 2009-11-22 ENCOUNTER — Ambulatory Visit (HOSPITAL_COMMUNITY): Admission: RE | Admit: 2009-11-22 | Discharge: 2009-11-22 | Payer: Self-pay | Admitting: Internal Medicine

## 2009-11-22 ENCOUNTER — Ambulatory Visit: Payer: Self-pay | Admitting: Cardiology

## 2009-11-22 ENCOUNTER — Ambulatory Visit: Payer: Self-pay | Admitting: Internal Medicine

## 2009-11-22 ENCOUNTER — Ambulatory Visit: Payer: Self-pay

## 2009-12-02 ENCOUNTER — Telehealth: Payer: Self-pay | Admitting: Internal Medicine

## 2009-12-02 LAB — CONVERTED CEMR LAB
CO2: 29 meq/L (ref 19–32)
Calcium: 9.3 mg/dL (ref 8.4–10.5)
Creatinine, Ser: 1.4 mg/dL — ABNORMAL HIGH (ref 0.4–1.2)
GFR calc non Af Amer: 39.86 mL/min (ref >60)
Glucose, Bld: 74 mg/dL (ref 70–99)
Sodium: 142 meq/L (ref 135–145)

## 2009-12-09 ENCOUNTER — Ambulatory Visit: Payer: Self-pay | Admitting: Internal Medicine

## 2009-12-09 LAB — CONVERTED CEMR LAB
BUN: 33 mg/dL — ABNORMAL HIGH (ref 6–23)
Calcium: 9.7 mg/dL (ref 8.4–10.5)
Creatinine, Ser: 1.6 mg/dL — ABNORMAL HIGH (ref 0.4–1.2)
GFR calc non Af Amer: 34.06 mL/min (ref >60)
Glucose, Bld: 76 mg/dL (ref 70–99)
Potassium: 4.3 meq/L (ref 3.5–5.1)
Pro B Natriuretic peptide (BNP): 621.3 pg/mL — ABNORMAL HIGH (ref 0.0–100.0)

## 2009-12-17 ENCOUNTER — Telehealth: Payer: Self-pay | Admitting: Internal Medicine

## 2009-12-17 ENCOUNTER — Ambulatory Visit: Payer: Self-pay | Admitting: Internal Medicine

## 2009-12-18 ENCOUNTER — Ambulatory Visit: Payer: Self-pay | Admitting: Internal Medicine

## 2009-12-30 ENCOUNTER — Ambulatory Visit: Payer: Self-pay | Admitting: Internal Medicine

## 2009-12-30 DIAGNOSIS — N184 Chronic kidney disease, stage 4 (severe): Secondary | ICD-10-CM

## 2009-12-30 LAB — CONVERTED CEMR LAB
BUN: 26 mg/dL — ABNORMAL HIGH (ref 6–23)
CO2: 27 meq/L (ref 19–32)
Calcium: 9.4 mg/dL (ref 8.4–10.5)
Creatinine, Ser: 1.4 mg/dL — ABNORMAL HIGH (ref 0.4–1.2)
Glucose, Bld: 78 mg/dL (ref 70–99)

## 2010-01-14 ENCOUNTER — Telehealth: Payer: Self-pay | Admitting: Internal Medicine

## 2010-01-15 ENCOUNTER — Ambulatory Visit
Admission: RE | Admit: 2010-01-15 | Discharge: 2010-01-15 | Payer: Self-pay | Source: Home / Self Care | Attending: Internal Medicine | Admitting: Internal Medicine

## 2010-01-16 ENCOUNTER — Telehealth: Payer: Self-pay | Admitting: Internal Medicine

## 2010-01-16 ENCOUNTER — Encounter: Payer: Self-pay | Admitting: Internal Medicine

## 2010-01-16 ENCOUNTER — Ambulatory Visit
Admission: RE | Admit: 2010-01-16 | Discharge: 2010-01-16 | Payer: Self-pay | Source: Home / Self Care | Attending: Internal Medicine | Admitting: Internal Medicine

## 2010-01-16 ENCOUNTER — Ambulatory Visit: Admission: RE | Admit: 2010-01-16 | Discharge: 2010-01-16 | Payer: Self-pay | Source: Home / Self Care

## 2010-02-09 LAB — CONVERTED CEMR LAB
BUN: 21 mg/dL (ref 6–23)
BUN: 28 mg/dL — ABNORMAL HIGH (ref 6–23)
CO2: 30 meq/L (ref 19–32)
Calcium: 9.4 mg/dL (ref 8.4–10.5)
Chloride: 108 meq/L (ref 96–112)
Creatinine, Ser: 1.2 mg/dL (ref 0.4–1.2)
GFR calc non Af Amer: 42.43 mL/min (ref >60)
Glucose, Bld: 73 mg/dL (ref 70–99)
Magnesium: 2.4 mg/dL (ref 1.5–2.5)
Potassium: 4.7 meq/L (ref 3.5–5.1)
Sodium: 144 meq/L (ref 135–145)

## 2010-02-11 ENCOUNTER — Telehealth: Payer: Self-pay | Admitting: Internal Medicine

## 2010-02-11 NOTE — Procedures (Signed)
Summary: Colonoscopy  Patient: Sandra [name] Note: All result statuses are Final unless otherwise noted.  Tests: (1) Colonoscopy (COL)   COL Colonoscopy           DONE     [facility]     [address]     [place], [place]  [address]           COLONOSCOPY PROCEDURE REPORT           PATIENT:  Sandra, [name]  MR#:  [id number]     BIRTHDATE:  [birth year], 77 yrs. old  GENDER:  female           ENDOSCOPIST:  Iva Boop, MD, [facility]           PROCEDURE DATE:  02/12/2009     PROCEDURE:  Higher-risk screening colonoscopy G0105 - surveillance     of adenomatous polyps           ASA CLASS:  Class III     INDICATIONS:  history of pre-cancerous (adenomatous) colon polyps     prior adenomas 2002, none in 2005           MEDICATIONS:   Fentanyl 50 mcg IV, Versed 7 mg IV           DESCRIPTION OF PROCEDURE:   After the risks benefits and     alternatives of the procedure were thoroughly explained, informed     consent was obtained.  Digital rectal exam was performed and     revealed no abnormalities.   The LB [id number] endoscope     was introduced through the anus and advanced to the cecum, which     was identified by both the appendix and ileocecal valve, without     limitations.  The quality of the prep was good, using MiraLax.     The instrument was then slowly withdrawn as the colon was fully     examined.     Insertion: 2:26 minutes Withdrawal: 10:08 minutes     <<PROCEDUREIMAGES>>           FINDINGS:  Severe diverticulosis was found in the sigmoid colon.     Also seen throughout the colon with lesser involvement.  This was     otherwise a normal examination of the colon.   Retroflexed views     in the rectum revealed no abnormalities.    The scope was then     withdrawn from the patient and the procedure completed.           COMPLICATIONS:  None           ENDOSCOPIC IMPRESSION:     1) Severe diverticulosis in the sigmoid colon     2) Otherwise normal  examination, good prep     3) Prior adenomas 2002 (8 and 5 mm)           REPEAT EXAM:  In 5 year(s) for routine colonoscopy for polyp     surveillance.           Iva Boop, MD, [facility]           CC:  [name], MD     The Patient           n.     Rosalie Doctor:   Iva Boop at 02/12/2009 11:03 AM           Lona Millard, [phone]  Note: An exclamation mark (!) indicates a result that was  not dispersed into the flowsheet. Document Creation Date: 02/12/2009 11:03 AM _______________________________________________________________________  (1) Order result status: Final Collection or observation date-time: 02/12/2009 10:53 Requested date-time:  Receipt date-time:  Reported date-time:  Referring Physician:   Ordering Physician: Stan Head [phone]) Specimen Source:  Source: Launa Grill Order Number: [id number] Lab site:   Appended Document: Colonoscopy    Clinical Lists Changes  Observations: Added new observation of COLONNXTDUE: 01/2014 (02/12/2009 11:08)      Appended Document: Colonoscopy    Clinical Lists Changes  Observations: Added new observation of COLONNXTDUE: 02/2014 (02/12/2009 12:53) Removed observation of COLONNXTDUE: 01/2014 (02/12/2009 11:08)

## 2010-02-11 NOTE — Cardiovascular Report (Signed)
Summary: Office Visit   Office Visit   Imported By: Roderic Ovens 07/17/2009 15:22:11  _____________________________________________________________________  External Attachment:    Type:   Image     Comment:   External Document

## 2010-02-11 NOTE — Progress Notes (Signed)
Summary: Pt calliong with questions about Tikosyn   Phone Note Call from Patient Call back at Home Phone [phone]   [name]: Patient Summary of Call: Pt calling regarding Tikosyn Initial call taken by: Judie Grieve,  July 29, 2009 2:21 PM  Follow-up for Phone Call        SPOKE WITH PT TIKOSYN IS GOING TO BE TO EXPENSIVE WILL FALL IN DOUGHNUT WHOLE AND FOR 1 MONTH  TIKOSYN IS [phone]  AND IN SEPT IS STILL GOING TO BE 306. IS THERE ANYTHING LESS EXPENSIVE FOR PT TO TAKE? Follow-up by: Scherrie Bateman, LPN,  July 29, 2009 2:45 PM  Additional Follow-up for Phone Call Additional follow up Details #1::        Pt in donut hole for Medicare.  Pharmacy is going to fill and capsules to make cheaper for patient.  Samples left at front desk for capsules.  Pt does not qualify for patient assistance program.  She will let us know if she is unable to afford medication. Gypsy Balsam RN BSN  July 30, 2009 8:58 AM

## 2010-02-11 NOTE — Miscellaneous (Signed)
Summary: med update  Clinical Lists Changes  Medications: Added new medication of TIKOSYN 125 MCG CAPS (DOFETILIDE) Take 1 capsule by mouth two times a day Observations: Added new observation of PI CARDIO: Increase Tikosyn per Dr Antoine Poche to twice daily.  Take Tikosyn and Tikosyn 1 capsule twice daily to equal this dose.  Samples given of Tikosyn #60, Lot #[id number], Exp 10/12.  Pt has follow up appt on June 30th with Dr Graciela Husbands.  (06/25/2009 13:58)      Patient Instructions: 1)  Increase Tikosyn per Dr Antoine Poche to twice daily.  Take Tikosyn and Tikosyn 1 capsule twice daily to equal this dose.  Samples given of Tikosyn #60, Lot #[id number], Exp 10/12.  Pt has follow up appt on June 30th with Dr Graciela Husbands.

## 2010-02-11 NOTE — Assessment & Plan Note (Signed)
Summary: PT/RCD - PT MAY BE DELAYED DUE TO CARDIOLOGY APPT RUNNING LAT...   Nurse Visit   Allergies: 1)  ! Novocain 2)  ! Niacin 3)  ! * Statins 4)  ! Altace 5)  ! Codeine Laboratory Results   Blood Tests   Date/Time Received: October 17, 2009 12:34 PM  Date/Time Reported: October 17, 2009 12:34 PM    INR: 2.8   (Normal Range: 0.88-1.12   Therap INR: 2.0-3.5) Comments: Wynona Canes, CMA  October 17, 2009 12:34 PM     Orders Added: 1)  Est. Patient Level I [99211] 2)  Protime [04540JW]  Laboratory Results   Blood Tests      INR: 2.8   (Normal Range: 0.88-1.12   Therap INR: 2.0-3.5) Comments: Wynona Canes, CMA  October 17, 2009 12:34 PM       ANTICOAGULATION RECORD PREVIOUS REGIMEN & LAB RESULTS Anticoagulation Diagnosis:  Atrial Fibrillation on  12/24/2008 Previous INR Goal Range:  2.0-3.0 on  08/24/2006 Previous INR:  2.4 on  09/18/2009 Previous Coumadin Dose(mg):  2.5mg ,5mg  alt. on  03/12/2009 Previous Regimen:  same on  08/20/2009 Previous Coagulation Comments:  OV on  09/01/2007  NEW REGIMEN & LAB RESULTS Current INR: 2.8 Regimen: same  (no change)       Repeat testing in: 4 weeks MEDICATIONS LOTENSIN 10 MG  TABS (BENAZEPRIL HCL) Take 1 tablet by mouth two times a day LASIX 20 MG  TABS (FUROSEMIDE) Take 1 tablet by mouth once a day ZETIA 10 MG  TABS (EZETIMIBE) Take 1 tablet by mouth once a day DOVONEX   SOLN (CALCIPOTRIENE SOLN) as directed COUMADIN 5 MG  TABS (WARFARIN SODIUM) alternate 5 mg and 2.5 every other day COREG 12.5 MG  TABS (CARVEDILOL) take one tablet by mouth two times a day PROCTOSOL HC 2.5 %  CREA (HYDROCORTISONE) as needed TRANXENE-T 7.5 MG  TABS (CLORAZEPATE DIPOTASSIUM) once daily as needed SYNTHROID 100 MCG  TABS (LEVOTHYROXINE SODIUM) once daily [BMN] ASPIRIN 81 MG TBEC (ASPIRIN) Take one tablet by mouth every other day VITAMIN E 400 UNIT CAPS (VITAMIN E) take one capsule by mouth daily FISH OIL 1000 MG CAPS  (OMEGA-3 FATTY ACIDS) take one capsule by mouth daily TIKOSYN 250 MCG CAPS (DOFETILIDE) 1 two times a day TIKOSYN 125 MCG CAPS (DOFETILIDE) Take 1 capsule by mouth two times a day   Anticoagulation Visit Questionnaire      Coumadin dose missed/changed:  No      Abnormal Bleeding Symptoms:  No   Any diet changes including alcohol intake, vegetables or greens since the last visit:  No Any illnesses or hospitalizations since the last visit:  No Any signs of clotting since the last visit (including chest discomfort, dizziness, shortness of breath, arm tingling, slurred speech, swelling or redness in leg):  No

## 2010-02-11 NOTE — Procedures (Signed)
Summary: pacer check/sl    Allergies: 1)  ! Novocain 2)  ! Niacin 3)  ! * Statins 4)  ! Altace   ICD Specifications Following MD:  Sherryl Manges, MD     ICD Vendor:  [facility]     ICD Model Number:  [phone]     ICD Serial Number:  [id number] ICD DOI:  11/12/2005     ICD Implanting MD:  Sherryl Manges, MD  Lead 1:    Location: RA     DOI: 11/12/2005     Model #: [id number]     Serial #: [id number]     Status: active Lead 2:    Location: RV     DOI: 11/12/2005     Model #: [id number]     Serial #: [id number]     Status: active Lead 3:    Location: LV     DOI: 11/12/2005     Model #: [id number]     Serial #: [id number]     Status: active  Indications::  HCM,CHF   ICD Follow Up Remote Check?  No Battery Voltage:  2.61 V     Charge Time:  11.6 seconds     Underlying rhythm:  SB ICD Dependent:  No       ICD Device Measurements Atrium:  Amplitude: 2.0 mV, Impedance: 455 ohms, Threshold: 0.5 V at 0.5 msec Right Ventricle:  Amplitude: 11.2 mV, Impedance: 375 ohms, Threshold: 1.0 V at 0.5 msec Left Ventricle:  Impedance: 690 ohms, Threshold: 0.75 V at 0.5 msec Shock Impedance: 34 ohms   Episodes MS Episodes:  20     Percent Mode Switch:  <1%     Coumadin:  Yes Shock:  0     ATP:  0     Nonsustained:  0     Atrial Pacing:  93%     Ventricular Pacing:  >99%  Brady Parameters Mode DDDR     Lower Rate Limit:  70     Upper Rate Limit 115 PAV 200     Sensed AV Delay:  150  Tachy Zones VF:  231     VT:  200     VT1:  171     Next Cardiology Appt Due:  05/13/2009 Tech Comments:  Normal device function. All mode switch episodes less than 1 minute.  No changes made today.  ROV 3 months SK. Gypsy Balsam RN BSN  February 18, 2009 10:03 AM

## 2010-02-11 NOTE — Progress Notes (Signed)
   Phone Note Outgoing Call   Call placed by: Michaeljames Milnes Call placed to: Patient Summary of Call: f/u w/pt to see how doing since increasing lasix. pt states feels back to normal but she states that she is not urinating much after taking the increased dosage now. Tried to have her take 80mg  but she did not want to.  She will continue 40mg  through this week and have labs retested on 11/11.

## 2010-02-11 NOTE — Miscellaneous (Signed)
Summary: Lasix order  Clinical Lists Changes  Medications: Rx of LASIX 20 MG  TABS (FUROSEMIDE) Take 2 tablet by mouth once a day;  #60 x 11;  Signed;  Entered by: Claris Gladden RN;  Authorized by: Nathen May, MD, [facility];  Method used: Electronically to [facility]  [phone]*, [address] [place], [place], [place]  [address], Ph: [phone] or [phone], Fax: [phone]    Prescriptions: LASIX 20 MG  TABS (FUROSEMIDE) Take 2 tablet by mouth once a day  #60 x 11   Entered by:   Claris Gladden RN   Authorized by:   Nathen May, MD, [facility]   Signed by:   Claris Gladden RN on 12/09/2009   Method used:   Electronically to        [facility]  [phone]* (retail)       [address]       [place], [place]  [address]       Ph: [phone] or [phone]       Fax: [phone]   RxID:   [id number]

## 2010-02-11 NOTE — Assessment & Plan Note (Signed)
Summary: swelling of throat/dm   Vital Signs:  Patient profile:   77 year old female Height:      65 inches (165.10 cm) Weight:      150.31 pounds (68.32 kg) O2 Sat:      94 % on Room air Temp:     98.2 degrees F (36.78 degrees C) oral Pulse rate:   66 / minute BP sitting:   118 / 80  (left arm) Cuff size:   regular  Vitals Entered By: Josph Macho RMA (September 06, 2009 11:09 AM)  O2 Flow:  Room air CC: swelling in throat/ coughing in morning w/mucus (clear)/CF Is Patient Diabetic? No   History of Present Illness: Patient in today c/o some swelling in her throat and cough productive of clear sputum in the morning especially. No recent illness, fevers, chills, malaise but she did have some recent dental work done and have some irritation after that has largely resolved. She noted some irritation in her throat on the left side about a month ago but did not have any associated symptoms and this resolved spontaneously then in mid August she had the dental work done and develop some gingival irritation and ulcers. The ulcer has cleared but yesterday she looked in her throat and she felt the left tonsillar pillar had a small swollen spot on it and the throat looked erythematous. No fevers/chills/CP/palp/SOB/GI or GU c/o. She denies any difficulty with reflux and she feels she can eat anything she wants without any difficulty. She does believe she may have some low grade allergies with occasional nasal congestion and itching at times. No significant symptoms in that regard recently She also notes a distant history of cigarette smoking which she quit 30 years ago. She has a history of cough variant asthma and can start to cough so badly that it strangles and chokes her at times. Only Albuterol will break it and she has even called rescue on occasion. This frequency is unchanged. She denies any change in bowels, dyspepsia, malaise.  Current Medications (verified): 1)  Lotensin 10 Mg  Tabs  (Benazepril Hcl) .... Take 1 Tablet By Mouth Two Times A Day 2)  Lasix 20 Mg  Tabs (Furosemide) .... Take 1 Tablet By Mouth Once A Day 3)  Zetia 10 Mg  Tabs (Ezetimibe) .... Take 1 Tablet By Mouth Once A Day 4)  Dovonex   Soln (Calcipotriene Soln) .... As Directed 5)  Coumadin 5 Mg  Tabs (Warfarin Sodium) .... Alternate 5 Mg and 2.5 Every Other Day 6)  Coreg 12.5 Mg  Tabs (Carvedilol) .... Take One Tablet By Mouth Two Times A Day 7)  Proctosol Hc 2.5 %  Crea (Hydrocortisone) .... As Needed 8)  Tranxene-T 7.5 Mg  Tabs (Clorazepate Dipotassium) .... Once Daily As Needed 9)  Synthroid 100 Mcg  Tabs (Levothyroxine Sodium) .... Once Daily 10)  Aspirin 81 Mg Tbec (Aspirin) .... Take One Tablet By Mouth Every Other Day 11)  Vitamin E 400 Unit Caps (Vitamin E) .... Take One Capsule By Mouth Daily 12)  Fish Oil 1000 Mg Caps (Omega-3 Fatty Acids) .... Take One Capsule By Mouth Daily 13)  Tikosyn 250 Mcg Caps (Dofetilide) .Marland Kitchen.. 1 Two Times A Day 14)  Tikosyn 125 Mcg Caps (Dofetilide) .... Take 1 Capsule By Mouth Two Times A Day  Allergies (verified): 1)  ! Novocain 2)  ! Niacin 3)  ! * Statins 4)  ! Altace 5)  ! Codeine  Past History:  Past medical history  reviewed for relevance to current acute and chronic problems. Social history (including risk factors) reviewed for relevance to current acute and chronic problems.  Past Medical History: Reviewed history from 07/10/2009 and no changes required. Hyperlipidemia Atrial fibrillation Hypothyroidism Bradycardia Hypertrophic nonobstructive cardiomyopathy Anal Fissure Adenomatous Colon Polyps Diverticulosis Thyroid disease AICD- [employer] v343 Systolic heart failure (EF 40-45%) Direct current cardioversion-2011  Social History: Reviewed history from 09/10/2008 and no changes required. Retired Married Alcohol use-no Drug use-no Patient is a former smoker.  Patient does not get regular exercise.   Review of Systems      See  HPI  Physical Exam  General:  Well-developed,well-nourished,in no acute distress; alert,appropriate and cooperative throughout examination Head:  Normocephalic and atraumatic without obvious abnormalities. No apparent alopecia or balding. Eyes:  No corneal or conjunctival inflammation noted. EOMI.  Ears:  External ear exam shows no significant lesions or deformities.  Otoscopic examination reveals clear canals, tympanic membranes are intact bilaterally without bulging, retraction, inflammation or discharge. Hearing is grossly normal bilaterally. Nose:  External nasal examination shows no deformity or inflammation. Nasal mucosa are pink and moist without lesions or exudates. Mouth:  Oral mucosa and oropharynx without lesions or exudates.  Teeth in good repair. Mild erythema noted in posterior oropharynx but no notable lesion is identified Neck:  No deformities, masses, or tenderness noted. Lungs:  Normal respiratory effort, chest expands symmetrically. Lungs are clear to auscultation, no crackles or wheezes. Heart:  Normal rate and regular rhythm. S1 and S2 normal without gallop, murmur, click, rub or other extra sounds. Abdomen:  Bowel sounds positive,abdomen soft and non-tender without masses, organomegaly or hernias noted. Extremities:    Cervical Nodes:  No lymphadenopathy noted Psych:  Cognition and judgment appear intact. Alert and cooperative with normal attention span and concentration. No apparent delusions, illusions, hallucinations   Impression & Recommendations:  Problem # 1:  COUGH (ICD-786.2)  Orders: ENT Referral (ENT) Unclear etiology consider combination of asthma, allergies and reflux sent for visualization to ENT. Asked to avoid spicy, fatty and acidic foods and eat small meals not too close to bedtime.  Problem # 2:  HYPERTROPHIC OBSTRUCTIVE CARDIOMYOPATHY (ICD-425.1) Is asymptomatic and following with cardiology  Complete Medication List: 1)  Lotensin 10 Mg Tabs  (Benazepril hcl) .... Take 1 tablet by mouth two times a day 2)  Lasix 20 Mg Tabs (Furosemide) .... Take 1 tablet by mouth once a day 3)  Zetia 10 Mg Tabs (Ezetimibe) .... Take 1 tablet by mouth once a day 4)  Dovonex Soln (Calcipotriene soln) .... As directed 5)  Coumadin 5 Mg Tabs (Warfarin sodium) .... Alternate 5 mg and 2.5 every other day 6)  Coreg 12.5 Mg Tabs (Carvedilol) .... Take one tablet by mouth two times a day 7)  Proctosol Hc 2.5 % Crea (Hydrocortisone) .... As needed 8)  Tranxene-t 7.5 Mg Tabs (Clorazepate dipotassium) .... Once daily as needed 9)  Synthroid 100 Mcg Tabs (Levothyroxine sodium) .... Once daily 10)  Aspirin 81 Mg Tbec (Aspirin) .... Take one tablet by mouth every other day 11)  Vitamin E 400 Unit Caps (Vitamin e) .... Take one capsule by mouth daily 12)  Fish Oil 1000 Mg Caps (Omega-3 fatty acids) .... Take one capsule by mouth daily 13)  Tikosyn 250 Mcg Caps (Dofetilide) .Marland Kitchen.. 1 two times a day 14)  Tikosyn 125 Mcg Caps (Dofetilide) .... Take 1 capsule by mouth two times a day  Patient Instructions: 1)  Please schedule an appointment with your  primary doctor in : as scheduled  .  2)  Avoid foods high in acid(tomatoes, citrus juices,spicy foods).Avoid eating within two hours of lying down or before exercising. Do not over eat: try smaller more frequent meals. Elevate head of bed twelve inches when sleeping.  3)  Avoid any large meals close to bedtime

## 2010-02-11 NOTE — Assessment & Plan Note (Signed)
Summary: INFECTION FROM DOG BITE/DLO   Vital Signs:  Patient profile:   77 year old female Weight:      151 pounds Temp:     98.1 degrees F oral Pulse rate:   80 / minute Pulse rhythm:   regular BP sitting:   140 / 70  (left arm) Cuff size:   regular  Vitals Entered By: Mervin Hack CMA Duncan Dull) (February 02, 2009 11:22 AM) CC: dog bite x1week   History of Present Illness: Pt here c/o swollen R hand after dog bite last week.  It is her dog and he has had all his shots.  It was an accident the pt states  Current Medications (verified): 1)  Lotensin 10 Mg  Tabs (Benazepril Hcl) .... Take 1 Tablet By Mouth Two Times A Day 2)  Lasix 20 Mg  Tabs (Furosemide) .... Take 1 Tablet By Mouth Once A Day 3)  Zetia 10 Mg  Tabs (Ezetimibe) .... Take 1 Tablet By Mouth Once A Day 4)  Dovonex   Soln (Calcipotriene Soln) .... As Directed 5)  Coumadin 5 Mg  Tabs (Warfarin Sodium) .... As Instructed 6)  Coreg 12.5 Mg  Tabs (Carvedilol) .... Two Times A Day 7)  Proctosol Hc 2.5 %  Crea (Hydrocortisone) .... As Needed 8)  Tranxene-T 7.5 Mg  Tabs (Clorazepate Dipotassium) .... Once Daily As Needed 9)  Synthroid 100 Mcg  Tabs (Levothyroxine Sodium) .... Once Daily 10)  Miralax   Powd (Polyethylene Glycol 3350) .... As Per Prep  Instructions. 11)  Dulcolax 5 Mg  Tbec (Bisacodyl) .... Day Before Procedure Take 2 At 3pm and 2 At 8pm. 12)  Reglan 10 Mg  Tabs (Metoclopramide Hcl) .... As Per Prep Instructions. 13)  Keflex 500 Mg Caps (Cephalexin) .Marland Kitchen.. 1 By Mouth Qid  Allergies: 1)  ! Novocain 2)  ! Niacin 3)  ! * Statins 4)  ! Altace  Past History:  Past medical, surgical, family and social histories (including risk factors) reviewed for relevance to current acute and chronic problems.  Past Medical History: Reviewed history from 11/19/2008 and no changes required. Hyperlipidemia Atrial fibrillation Hypothyroidism Bradycardia HYpertrophic nonobstructive cardiomyopathy Anal Fissure Adenomatous  Colon Polyps Diverticulosis Thyroid disease AICD- [employer] [phone]  Past Surgical History: Reviewed history from 11/19/2008 and no changes required. pacer/defibrilator 11/07 Permanent pacemaker-[employer] Jude  Family History: Reviewed history from 09/10/2008 and no changes required. No FH of Colon Cancer: Family History of Colon Polyps:Father  Family History of Heart Disease: Mother and multiple family members on mother side.   Social History: Reviewed history from 09/10/2008 and no changes required. Retired Married Alcohol use-no Drug use-no Patient is a former smoker.  Patient does not get regular exercise.   Review of Systems      See HPI  Physical Exam  General:  Well-developed,well-nourished,in no acute distress; alert,appropriate and cooperative throughout examination Skin:  R hand --+ bite mark on index finger and reddness and swelling in fingers and back of hand no streaking warm to touch Psych:  Oriented X3 and normally interactive.     Impression & Recommendations:  Problem # 1:  DOG BITE (ICD-E906.0) TD given keflex f/u pcp next week if no better.  If worsens over weekend go to ER.  Problem # 2:  CELLULITIS AND ABSCESS OF UNSPECIFIED SITE (ICD-682.9)  Her updated medication list for this problem includes:    Keflex 500 Mg Caps (Cephalexin) .Marland Kitchen... 1 by mouth qid  Orders: Rocephin  250mg  (G2542) Admin of  Therapeutic Inj  intramuscular or subcutaneous (91478)  Elevate affected area. Warm moist compresses for 20 minutes every 2 hours while awake. Take antibiotics as directed and take acetaminophen as needed. To be seen in 48-72 hours if no improvement, sooner if worse.  Complete Medication List: 1)  Lotensin 10 Mg Tabs (Benazepril hcl) .... Take 1 tablet by mouth two times a day 2)  Lasix 20 Mg Tabs (Furosemide) .... Take 1 tablet by mouth once a day 3)  Zetia 10 Mg Tabs (Ezetimibe) .... Take 1 tablet by mouth once a day 4)  Dovonex Soln (Calcipotriene  soln) .... As directed 5)  Coumadin 5 Mg Tabs (Warfarin sodium) .... As instructed 6)  Coreg 12.5 Mg Tabs (Carvedilol) .... Two times a day 7)  Proctosol Hc 2.5 % Crea (Hydrocortisone) .... As needed 8)  Tranxene-t 7.5 Mg Tabs (Clorazepate dipotassium) .... Once daily as needed 9)  Synthroid 100 Mcg Tabs (Levothyroxine sodium) .... Once daily 10)  Miralax Powd (Polyethylene glycol 3350) .... As per prep  instructions. 11)  Dulcolax 5 Mg Tbec (Bisacodyl) .... Day before procedure take 2 at 3pm and 2 at 8pm. 12)  Reglan 10 Mg Tabs (Metoclopramide hcl) .... As per prep instructions. 13)  Keflex 500 Mg Caps (Cephalexin) .Marland Kitchen.. 1 by mouth qid  Other Orders: TD Toxoids IM 7 YR + (29562) Admin 1st Vaccine (13086) Admin 1st Vaccine [facility]) [phone]) Prescriptions: KEFLEX 500 MG CAPS (CEPHALEXIN) 1 by mouth qid  #40 x 0   Entered and Authorized by:   Loreen Freud DO   Signed by:   Loreen Freud DO on 02/02/2009   Method used:   Electronically to        [facility]  [phone]* (retail)       [address]       [place], [place]  [address]       Ph: [phone] or [phone]       Fax: [phone]   RxID:   [id number]   Current Allergies (reviewed today): ! NOVOCAIN ! NIACIN ! * STATINS ! ALTACE   Tetanus/Td Vaccine    Vaccine Type: Td    Site: right deltoid    Mfr: Sanofi Pasteur    Dose: 0.5 ml    Route: IM    Given by: Mervin Hack CMA (AAMA)    Exp. Date: 11/27/2010    Lot #: [id number]    VIS given: 11/30/06 version given February 02, 2009.    Medication Administration  Injection # 1:    Medication: Rocephin  250mg     Diagnosis: CELLULITIS AND ABSCESS OF UNSPECIFIED SITE (ICD-682.9)    Route: IM    Site: RUOQ gluteus    Exp Date: 12/13/2010    Lot #: [id number]    Mfr: Novartis    Comments: patient was given 1gm    Patient tolerated injection without complications    Given by: Mervin Hack CMA Duncan Dull) (February 02, 2009 12:17  PM)  Orders Added: 1)  TD Toxoids IM 7 YR + [90714] 2)  Admin 1st Vaccine [90471] 3)  Admin 1st Vaccine [facility]) [32202R] 4)  Rocephin  250mg  [J0696] 5)  Admin of Therapeutic Inj  intramuscular or subcutaneous [96372] 6)  Est. Patient Level III [42706]

## 2010-02-11 NOTE — Miscellaneous (Signed)
Summary: [facility]  Clinical Lists Changes  Medications: Added new medication of MIRALAX   POWD (POLYETHYLENE GLYCOL 3350) As per prep  instructions. - Signed Added new medication of DULCOLAX 5 MG  TBEC (BISACODYL) Day before procedure take 2 at 3pm and 2 at 8pm. - Signed Added new medication of REGLAN 10 MG  TABS (METOCLOPRAMIDE HCL) As per prep instructions. - Signed Rx of MIRALAX   POWD (POLYETHYLENE GLYCOL 3350) As per prep  instructions.;  #255gm x 0;  Signed;  Entered by: Ezra Sites RN;  Authorized by: Iva Boop MD, [facility];  Method used: Electronically to [facility]  [phone]*, [address], [place], [place], [place]  [address], Ph: [phone] or [phone], Fax: [phone] Rx of DULCOLAX 5 MG  TBEC (BISACODYL) Day before procedure take 2 at 3pm and 2 at 8pm.;  #4 x 0;  Signed;  Entered by: Ezra Sites RN;  Authorized by: Iva Boop MD, [facility];  Method used: Electronically to [facility]  [phone]*, [address] [place], [place], [place]  [address], Ph: [phone] or [phone], Fax: [phone] Rx of REGLAN 10 MG  TABS (METOCLOPRAMIDE HCL) As per prep instructions.;  #2 x 0;  Signed;  Entered by: Ezra Sites RN;  Authorized by: Iva Boop MD, [facility];  Method used: Electronically to [facility]  [phone]*, [address], [place], [place], [place]  [address], Ph: [phone] or [phone], Fax: [phone] Allergies: Changed allergy or adverse reaction from NIACIN to NIACIN Changed allergy or adverse reaction from * STATINS to * STATINS Changed allergy or adverse reaction from ALTACE to ALTACE    Prescriptions: REGLAN 10 MG  TABS (METOCLOPRAMIDE HCL) As per prep instructions.  #2 x 0   Entered by:   Ezra Sites RN   Authorized by:   Iva Boop MD, [facility]   Signed by:   Ezra Sites RN on 01/21/2009   Method used:   Electronically to        [facility]  [phone]* (retail)       [address]       [place], [place]  [address]       Ph: [phone] or [phone]       Fax: [phone]   RxID:   [id number] DULCOLAX 5 MG  TBEC (BISACODYL) Day before procedure take 2 at 3pm and 2 at 8pm.  #4 x 0   Entered by:   Ezra Sites RN   Authorized by:   Iva Boop MD, [facility]   Signed by:   Ezra Sites RN on 01/21/2009   Method used:   Electronically to        [facility]  [phone]* (retail)       [address]       [place], [place]  [address]       Ph: [phone] or [phone]       Fax: [phone]   RxID:   [id number] MIRALAX   POWD (POLYETHYLENE GLYCOL 3350) As per prep  instructions.  #255gm x 0   Entered by:   Ezra Sites RN   Authorized by:   Iva Boop MD, [facility]   Signed by:   Ezra Sites RN on 01/21/2009   Method used:   Electronically to        [facility]  [phone]* (retail)  [address]       [place], [place]  [address]       Ph: [phone] or [phone]       Fax: [phone]   RxID:   [id number]

## 2010-02-11 NOTE — Progress Notes (Signed)
Summary: pt rtn your call/lg   Phone Note Call from Patient   [name]: Patient Reason for Call: Talk to Nurse, Talk to Doctor Summary of Call: pt rtn your call she thinks it is regarding an appt/lg Initial call taken by: Omer Jack,  December 02, 2009 3:11 PM  Follow-up for Phone Call        pt to come back 11/28 at 1130. Claris Gladden, RN, BSN Follow-up by: Claris Gladden RN,  December 02, 2009 3:53 PM

## 2010-02-11 NOTE — Cardiovascular Report (Signed)
Summary: Office Visit   Office Visit   Imported By: Roderic Ovens 08/09/2009 10:23:02  _____________________________________________________________________  External Attachment:    Type:   Image     Comment:   External Document

## 2010-02-11 NOTE — Progress Notes (Signed)
Summary: pvc since last cardivoersion   Phone Note Call from Patient Call back at Home Phone [phone]   [name]: Patient Reason for Call: Talk to Nurse Details for Reason: Per pt calling, think she should be seen today, c/p pvc since last cardioversion.  hurting in thoat. advice pt that i would send an important message around to dr. Graciela Husbands nurse.  Initial call taken by: Lorne Skeens,  March 04, 2009 8:10 AM  Follow-up for Phone Call        Pt thinks she is in AF again, her PVC's have been increasing since her cardioversion in Nov. She is SOB and does not feel well. We are completely full for the next two days, no PA here. I told her to go to the ER for possible cardioversion if she continues to have symptoms.  Follow-up by: Duncan Dull, RN, BSN,  March 04, 2009 8:46 AM

## 2010-02-11 NOTE — Progress Notes (Signed)
Summary: cough and sore throat  Phone Note Call from Patient   [name]: Patient Call For: [name] MD Summary of Call: Pt is coughing since Saturday, and now has a severe sore throat.  No fever.  Is taking Mucinex DM one by mouth two times a day. Needs RX.  Nicolette Bang ([place]).  Has cough meds. [phone] Laryngitis and feels like throat is closing.  No fever. Initial call taken by: Lynann Beaver CMA,  June 18, 2009 9:58 AM  Follow-up for Phone Call        per dr Lovell Sheehan-  may have z pack-tussinex 1 tsp every 12 hours 4oz( if she doesnt have any other cough medicine) Follow-up by: Willy Eddy, LPN,  June 19, 863 1:44 PM    New/Updated Medications: ZITHROMAX Z-PAK 250 MG TABS (AZITHROMYCIN) as directed Prescriptions: ZITHROMAX Z-PAK 250 MG TABS (AZITHROMYCIN) as directed  #6 x 0   Entered by:   Lynann Beaver CMA   Authorized by:   [name] MD   Signed by:   Lynann Beaver CMA on 06/18/2009   Method used:   Electronically to        [facility]  [phone]* (retail)       [address]       [place], [place]  [address]       Ph: [phone] or [phone]       Fax: [phone]   RxID:   [id number]  Pt. notified.

## 2010-02-11 NOTE — Assessment & Plan Note (Signed)
Summary: 3 month fup//ccm   Vital Signs:  Patient profile:   77 year old female Height:      65 inches Weight:      149 pounds BMI:     24.88 Temp:     98.2 degrees F oral Pulse rate:   72 / minute Resp:     14 per minute BP sitting:   136 / 82  (left arm)  Vitals Entered By: Willy Eddy, LPN (September 30, 2009 10:24 AM) CC: roa, Lipid Management Is Patient Diabetic? No   Primary Care Provider:  Darryll Capers, MD  CC:  roa and Lipid Management.  History of Present Illness: The pt  daughter is in the hospital She is very aggitated over this event  Lipid Management History:      Positive NCEP/ATP III risk factors include female age 45 years old or older, HDL cholesterol less than 40, and family history for ischemic heart disease (females less than 75 years old & males less than 65 years old).  Negative NCEP/ATP III risk factors include non-tobacco-user status.     Preventive Screening-Counseling & Management  Alcohol-Tobacco     Smoking Status: quit     Smoking Cessation Counseling: no     Passive Smoke Exposure: no     Tobacco Counseling: not indicated; no tobacco use  Problems Prior to Update: 1)  Cough  (ICD-786.2) 2)  Hypotension  (ICD-458.9) 3)  Premature Ventricular Contractions  (ICD-427.69) 4)  Cellulitis and Abscess of Unspecified Site  (ICD-682.9) 5)  Dog Bite  (ICD-E906.0) 6)  Systolic Heart Failure, Chronic  (ICD-428.22) 7)  Implantation of Defibrillator, St. 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.50) 16)  Lumbosacral Strain  (ICD-846.0) 17)  Diverticulitis, Acute  (ICD-562.11) 18)  Contusion of  Upper Arm  (ICD-923.03) 19)  Loc Osteoarthros Not Spec Prim/sec Lower Leg  (ICD-715.36) 20)  Contusion of Hip  (ICD-924.01) 21)  Uns Advrs Eff Uns Rx Medicinal&biological Sbstnc  (ICD-995.20) 22)  Aftercare, Long-term Use, Anticoagulants  (ICD-V58.61) 23)  Encounter For Therapeutic Drug Monitoring  (ICD-V58.83) 24)  Hyperlipidemia  (ICD-272.4) 25)  Hypothyroidism  (ICD-244.9)  Current Problems (verified): 1)  Cough  (ICD-786.2) 2)  Hypotension  (ICD-458.9) 3)  Premature Ventricular Contractions  (ICD-427.69) 4)  Cellulitis and Abscess of Unspecified Site  (ICD-682.9) 5)  Dog Bite  (ICD-E906.0) 6)  Systolic Heart Failure, Chronic  (ICD-428.22) 7)  Implantation of Defibrillator, St. 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.50) 16)  Lumbosacral Strain  (ICD-846.0) 17)  Diverticulitis, Acute  (ICD-562.11) 18)  Contusion of Upper Arm  (ICD-923.03) 19)  Loc Osteoarthros Not Spec Prim/sec Lower Leg  (ICD-715.36) 20)  Contusion of Hip  (ICD-924.01) 21)  Uns Advrs Eff Uns Rx Medicinal&biological Sbstnc  (ICD-995.20) 22)  Aftercare, Long-term Use, Anticoagulants  (ICD-V58.61) 23)  Encounter For Therapeutic Drug Monitoring  (ICD-V58.83) 24)  Hyperlipidemia  (ICD-272.4) 25)  Hypothyroidism  (ICD-244.9)  Medications Prior to Update: 1)  Lotensin 10 Mg  Tabs (Benazepril Hcl) .... Take 1 Tablet By Mouth Two Times A Day 2)  Lasix 20 Mg  Tabs (Furosemide) .... Take 1 Tablet By Mouth Once A Day 3)  Zetia 10 Mg  Tabs (Ezetimibe) .... Take 1 Tablet By Mouth Once A Day 4)  Dovonex   Soln (Calcipotriene Soln) .... As Directed 5)  Coumadin 5 Mg  Tabs  (Warfarin Sodium) .... Alternate 5 Mg and 2.5 Every Other Day 6)  Coreg 12.5 Mg  Tabs (Carvedilol) .... Take One Tablet By Mouth Two Times A Day 7)  Proctosol Hc 2.5 %  Crea (Hydrocortisone) .... As Needed 8)  Tranxene-T 7.5 Mg  Tabs (Clorazepate Dipotassium) .... Once Daily As Needed 9)  Synthroid 100 Mcg  Tabs (Levothyroxine Sodium) .... Once Daily 10)  Aspirin 81 Mg Tbec (Aspirin) .... Take One Tablet By Mouth Every Other Day 11)  Vitamin E 400 Unit Caps (Vitamin E) .... Take One Capsule By Mouth Daily 12)  Fish Oil 1000 Mg Caps (Omega-3 Fatty Acids) .... Take One Capsule By Mouth Daily 13)  Tikosyn 250 Mcg Caps (Dofetilide) .Marland Kitchen.. 1 Two Times A Day 14)  Tikosyn 125 Mcg Caps (Dofetilide) .... Take 1 Capsule By Mouth Two Times A Day  Current Medications (verified): 1)  Lotensin 10 Mg  Tabs (Benazepril Hcl) .... Take 1 Tablet By Mouth Two Times A Day 2)  Lasix 20 Mg  Tabs (Furosemide) .... Take 1 Tablet By Mouth Once A Day 3)  Zetia 10 Mg  Tabs (Ezetimibe) .... Take 1 Tablet By Mouth Once A Day 4)  Dovonex   Soln (Calcipotriene Soln) .... As Directed 5)  Coumadin 5 Mg  Tabs (Warfarin Sodium) .... Alternate 5 Mg and 2.5 Every Other Day 6)  Coreg 12.5 Mg  Tabs (Carvedilol) .... Take One Tablet By Mouth Two Times A Day 7)  Proctosol Hc 2.5 %  Crea (Hydrocortisone) .... As Needed 8)  Tranxene-T 7.5 Mg  Tabs (Clorazepate Dipotassium) .... Once Daily As Needed 9)  Synthroid 100 Mcg  Tabs (Levothyroxine Sodium) .... Once Daily 10)  Aspirin 81 Mg Tbec (Aspirin) .... Take One Tablet By Mouth Every Other Day 11)  Vitamin E 400 Unit Caps (Vitamin E) .... Take One Capsule By Mouth Daily 12)  Fish Oil 1000 Mg Caps (Omega-3 Fatty Acids) .... Take One Capsule By Mouth Daily 13)  Tikosyn 250 Mcg Caps (Dofetilide) .Marland Kitchen.. 1 Two Times A Day 14)  Tikosyn 125 Mcg Caps (Dofetilide) .... Take 1 Capsule By Mouth Two Times A Day  Allergies (verified): 1)  ! Novocain 2)  ! Niacin 3)  ! * Statins 4)  ! Altace 5)   ! Codeine  Past History:  Family History: Last updated: 06/24/2009 Family History of Heart Disease: Mother and multiple family members on mother side.  Family History of Colon Cancer: Father had malignant polyp removed via colonoscopy in his 83's ("cancer cells in the polyp" per patient) Daughter with cardiomyopathy  Social History: Last updated: 09/10/2008 Retired Married Alcohol use-no Drug use-no Patient is a former smoker.  Patient does not get regular exercise.   Risk Factors: Exercise: no (09/10/2008)  Risk Factors: Smoking Status: quit (09/30/2009) Passive Smoke Exposure: no (09/30/2009)  Past medical, surgical, family and social histories (including risk factors) reviewed, and no changes noted (except as noted below).  Past Medical History: Reviewed history from 07/10/2009 and no changes required.  Hyperlipidemia Atrial fibrillation Hypothyroidism Bradycardia Hypertrophic nonobstructive cardiomyopathy Anal Fissure Adenomatous Colon Polyps Diverticulosis Thyroid disease AICD- St. Jude Atlas v343 Systolic heart failure (EF 40-45%) Direct current cardioversion-2011  Past Surgical History: Reviewed history from 07/10/2009 and no changes required. Pacer/defibrilator 11/07 St Jude  Direct current cardioversion-2011  Family History: Reviewed history from 06/24/2009 and no changes required. Family History of Heart Disease: Mother and multiple family members on mother side.  Family History of Colon Cancer: Father had malignant polyp removed via colonoscopy in his 73's ("cancer cells in the polyp" per patient) Daughter with cardiomyopathy  Social History: Reviewed history from 09/10/2008 and no changes required. Retired Married Alcohol use-no Drug use-no Patient is a former smoker.  Patient does not get regular exercise.   Review of Systems  The patient denies anorexia, fever, weight loss, weight gain, vision loss, decreased hearing, hoarseness, chest  pain, syncope, dyspnea on exertion, peripheral edema, prolonged cough, headaches, hemoptysis, abdominal pain, melena, hematochezia, severe indigestion/heartburn, hematuria, incontinence, genital sores, muscle weakness, suspicious skin lesions, transient blindness, difficulty walking, depression, unusual weight change, abnormal bleeding, enlarged lymph nodes, angioedema, breast masses, and testicular masses.         Flu Vaccine Consent Questions     Do you have a history of severe allergic reactions to this vaccine? no    Any prior history of allergic reactions to egg and/or gelatin? no    Do you have a sensitivity to the preservative Thimersol? no    Do you have a past history of Guillan-Barre Syndrome? no    Do you currently have an acute febrile illness? no    Have you ever had a severe reaction to latex? no    Vaccine information given and explained to patient? yes    Are you currently pregnant? no    Lot Number:[id number]   Exp Date:07/12/2010   Site Given  Left Deltoid IM   Physical Exam  General:  Well-developed,well-nourished,in no acute distress; alert,appropriate and cooperative throughout examination Head:  Normocephalic and atraumatic without obvious abnormalities. No apparent alopecia or balding. Eyes:  No corneal or conjunctival inflammation noted. EOMI.  Ears:  External ear exam shows no significant lesions or deformities.  Otoscopic examination reveals clear canals, tympanic membranes are intact bilaterally without bulging, retraction, inflammation or discharge. Hearing is grossly normal bilaterally. Nose:  External nasal examination shows no deformity or inflammation. Nasal mucosa are pink and moist without lesions or exudates. Mouth:  Oral mucosa and oropharynx without lesions or exudates.  Teeth in good repair. Mild erythema noted in posterior oropharynx but no notable lesion is identified Neck:  No deformities, masses, or tenderness noted. Lungs:  Normal respiratory effort,  chest expands symmetrically. Lungs are clear to auscultation, no crackles or wheezes. Heart:  Normal rate and regular rhythm. S1 and S2 normal without gallop, murmur, click, rub or other extra sounds. Abdomen:  Bowel sounds positive,abdomen soft and non-tender without masses, organomegaly or hernias noted. Msk:  No deformity or scoliosis noted of thoracic or lumbar spine.   Pulses:  R and L carotid,radial,femoral,dorsalis pedis and posterior tibial pulses are full and equal bilaterally Extremities:  No clubbing, cyanosis, edema, or deformity noted with normal full range of motion of all joints.   Neurologic:  No cranial nerve deficits noted. Station and gait are normal. Plantar reflexes are down-going bilaterally. DTRs are symmetrical throughout. Sensory, motor and coordinative functions appear intact.   Impression & Recommendations:  Problem # 1:  SYSTOLIC HEART FAILURE, CHRONIC (ICD-428.22) stable, rate related  Her updated medication list for this problem includes:    Lotensin 10 Mg Tabs (Benazepril hcl) .Marland Kitchen... Take 1 tablet by mouth two times a day    Lasix 20 Mg Tabs (Furosemide) .Marland Kitchen... Take 1 tablet by mouth once a day    Coumadin 5 Mg Tabs (Warfarin sodium) .Marland Kitchen... Alternate 5 mg and 2.5 every other day    Coreg 12.5 Mg Tabs (Carvedilol) .Marland Kitchen... Take one tablet by mouth two times a day    Aspirin 81 Mg Tbec (Aspirin) .Marland Kitchen... Take one tablet by mouth every other day  Echocardiogram:   SUMMARY   -  Overall left ventricular systolic function was mildly to         moderately decreased. Left ventricular ejection fraction was         estimated , range being 40 % to 45 %. severe hypokinesis of         the anterolateral and anteroseptal segments.   -  There was mild mitral annular calcification. There was mild         mitral valvular regurgitation.   -  The left atrium was moderately dilated.   -  The right ventricle was mildly dilated. There was mild right         ventricular hypertrophy. The  estimated peak right ventricular         systolic pressure not ideally defined; probably mildly         increased. pacing wire in the right ventricle.   -  There was mild tricuspid valve prolapse.   -  The right atrium was mildly dilated.   COMPARISONS   -  Compared to the previous study of 23-Feb-2002 :LV thrombus no         longer seen; LV systolic function has improved. Pacing wire         now present.    ---------------------------------------------------------------   Prepared and Electronically Authenticated by   [place] Bing M.D. (11/23/2005)  Problem # 2:  HYPERTROPHIC OBSTRUCTIVE CARDIOMYOPATHY (ICD-425.1) see above has AF and  hocm  Problem # 3:  HYPOTHYROIDISM (ICD-244.9)  Her updated medication list for this problem includes:    Synthroid 100 Mcg Tabs (Levothyroxine sodium) ..... Once daily  Labs Reviewed: TSH: 0.17 (09/04/2008)    Chol: 185 (06/24/2009)   HDL: 34.90 (06/24/2009)   LDL: 124 (06/24/2009)   TG: 130.0 (06/24/2009)  Problem # 4:  ATRIAL FIBRILLATION (ICD-427.31)  Her updated medication list for this problem includes:    Coumadin 5 Mg Tabs (Warfarin sodium) .Marland Kitchen... Alternate 5 mg and 2.5 every other day    Coreg 12.5 Mg Tabs (Carvedilol) .Marland Kitchen... Take one tablet by mouth two times a day    Aspirin 81 Mg Tbec (Aspirin) .Marland Kitchen... Take one tablet by mouth every other day    Tikosyn 250 Mcg Caps (Dofetilide) .Marland Kitchen... 1 two times a day    Tikosyn 125 Mcg Caps (Dofetilide) .Marland Kitchen... Take 1 capsule by mouth two times a day  Reviewed the following: PT: 20.7 (05/21/2009)   INR: 2.4 (09/18/2009) Coumadin Dose (weekly): 25 mg (12/24/2008) Prior Coumadin Dose (weekly): 25 mg (12/24/2008) Next Protime: 4 weeks (dated on 09/18/2009)  Problem # 5:  COUGH VARIANT ASTHMA (AVW-098.11)  Pulmonary Functions Reviewed: O2 sat: 94 (09/06/2009)  Complete Medication List: 1)  Lotensin 10 Mg Tabs (Benazepril hcl) .... Take 1 tablet by mouth two times a day 2)  Lasix 20 Mg Tabs  (Furosemide) .... Take 1 tablet by mouth once a day 3)  Zetia 10 Mg Tabs (Ezetimibe) .... Take 1 tablet by mouth once a day 4)  Dovonex Soln (Calcipotriene soln) .... As directed 5)  Coumadin 5 Mg Tabs (Warfarin sodium) .... Alternate 5 mg and 2.5 every other day 6)  Coreg 12.5 Mg Tabs (Carvedilol) .... Take one tablet by mouth two times a day 7)  Proctosol Hc 2.5 % Crea (Hydrocortisone) .... As needed 8)  Tranxene-t 7.5 Mg Tabs (Clorazepate dipotassium) .... Once daily as needed 9)  Synthroid 100 Mcg Tabs (Levothyroxine sodium) .... Once daily 10)  Aspirin 81 Mg Tbec (Aspirin) .... Take one tablet by mouth every other day 11)  Vitamin E 400 Unit Caps (Vitamin e) .... Take one capsule by mouth daily 12)  Fish Oil 1000 Mg Caps (Omega-3 fatty acids) .... Take one capsule by mouth daily 13)  Tikosyn 250 Mcg Caps (Dofetilide) .Marland Kitchen.. 1 two times a day 14)  Tikosyn 125 Mcg Caps (Dofetilide) .... Take 1 capsule by mouth two times a day  Other Orders: Flu Vaccine 48yrs + MEDICARE PATIENTS (Z6109) Administration Flu vaccine - MCR (G0008)  Lipid Assessment/Plan:      Based on NCEP/ATP III, the patient's risk factor category is "2 or more risk factors and a calculated 10 year CAD risk of < 20%".  The patient's lipid goals are as follows: Total cholesterol goal is 200; LDL cholesterol goal is 130; HDL cholesterol goal is 40; Triglyceride goal is 150.    Patient Instructions: 1)  Please schedule a follow-up appointment in 3 months.

## 2010-02-11 NOTE — Assessment & Plan Note (Signed)
Summary: eph/jml      Allergies Added:   Referring Provider:  Darryll Capers, MD Primary Provider:  Darryll Capers, MD   History of Present Illness: Sandra [name] is seen in followup for congestive heart failure in the setting of hypertrophic cardiomyopathy with depressed left ventricular function. She is status post CRT-D  implantation. She is on Coumadin. Because of recurrences of atrial fibrillation for the fall and winter, she is also a put on dofetilide in February. She recently underwent repeat cardioversion because of recurrence of atrial fibrillation; her Tikosyn dose at that time was increased from 250-375 twice daily.  She is feeling somewhat better, but got some infection ie diarrhea while in hospital This is resolving  Her SOB is better and no edema       Current Medications (verified): 1)  Lotensin 10 Mg  Tabs (Benazepril Hcl) .... Take 1 Tablet By Mouth Two Times A Day 2)  Lasix 20 Mg  Tabs (Furosemide) .... Take 1 Tablet By Mouth Once A Day 3)  Zetia 10 Mg  Tabs (Ezetimibe) .... Take 1 Tablet By Mouth Once A Day 4)  Dovonex   Soln (Calcipotriene Soln) .... As Directed 5)  Coumadin 5 Mg  Tabs (Warfarin Sodium) .... Alternate 5 Mg and 2.5 Every Other Day 6)  Coreg 12.5 Mg  Tabs (Carvedilol) .... Take One Tablet By Mouth Two Times A Day 7)  Proctosol Hc 2.5 %  Crea (Hydrocortisone) .... As Needed 8)  Tranxene-T 7.5 Mg  Tabs (Clorazepate Dipotassium) .... Once Daily As Needed 9)  Synthroid 100 Mcg  Tabs (Levothyroxine Sodium) .... Once Daily 10)  Aspirin 81 Mg Tbec (Aspirin) .... Take One Tablet By Mouth Every Other Day 11)  Vitamin E 400 Unit Caps (Vitamin E) .... Take One Capsule By Mouth Daily 12)  Fish Oil 1000 Mg Caps (Omega-3 Fatty Acids) .... Take One Capsule By Mouth Daily 13)  Tikosyn 250 Mcg Caps (Dofetilide) .Marland Kitchen.. 1 Two Times A Day 14)  Tikosyn 125 Mcg Caps (Dofetilide) .... Take 1 Capsule By Mouth Two Times A Day  Allergies (verified): 1)  ! Novocain 2)  !  Niacin 3)  ! * Statins 4)  ! Altace 5)  ! Codeine  Past History:  Past Medical History: Last updated: 07/10/2009 Hyperlipidemia Atrial fibrillation Hypothyroidism Bradycardia Hypertrophic nonobstructive cardiomyopathy Anal Fissure Adenomatous Colon Polyps Diverticulosis Thyroid disease AICD- St. Jude Atlas v343 Systolic heart failure (EF 40-45%) Direct current cardioversion-2011  Vital Signs:  Patient profile:   77 year old female Height:      65 inches Weight:      148 pounds BMI:     24.72 Pulse rate:   70 / minute Resp:     18 per minute BP sitting:   108 / 64  (left arm)  Vitals Entered By: Marrion Coy, CNA (July 11, 2009 10:39 AM)  Physical Exam  General:  The patient was alert and oriented in no acute distress. HEENT Normal.  Neck veins were flat, carotids were brisk.  Lungs were clear.  Heart sounds were regular without murmurs or gallops.  Abdomen was soft with active bowel sounds. There is no clubbing cyanosis or edema. Skin Warm and dry     ICD Specifications Following MD:  Sherryl Manges, MD     ICD Vendor:  [facility] Jude     ICD Model Number:  [phone]     ICD Serial Number:  [id number] ICD DOI:  11/12/2005     ICD Implanting MD:  Sherryl Manges, MD  Lead 1:    Location: RA     DOI: 11/12/2005     Model #: [id number]     Serial #: [id number]     Status: active Lead 2:    Location: RV     DOI: 11/12/2005     Model #: 7000     Serial #: [id number]     Status: active Lead 3:    Location: LV     DOI: 11/12/2005     Model #: [id number]     Serial #: [id number]     Status: active  Indications::  HCM,CHF   ICD Follow Up Battery Voltage:  2.59 V     Charge Time:  12.1 seconds     Underlying rhythm:  SINUS BRADY ICD Dependent:  No       ICD Device Measurements Atrium:  Amplitude: 2.8 mV, Impedance: 440 ohms, Threshold: 0.75 V at 0.5 msec Right Ventricle:  Amplitude: 9.8 mV, Impedance: 375 ohms, Threshold: 0.75 V at 0.5 msec Left Ventricle:  Impedance: 680 ohms, Threshold: 0.75 V at  0.5 msec Shock Impedance: 34 ohms   Episodes MS Episodes:  0     Percent Mode Switch:  0     Coumadin:  Yes Shock:  0     ATP:  0     Nonsustained:  0     Atrial Therapies:  0 Atrial Pacing:  99%     Ventricular Pacing:  99%  Brady Parameters Mode DDDR     Lower Rate Limit:  70     Upper Rate Limit 115 PAV 200     Sensed AV Delay:  150  Tachy Zones VF:  231     VT:  200     VT1:  171     Next Cardiology Appt Due:  09/12/2009 Tech Comments:  NORMAL DEVICE FUNCTION.  NO EPISODES SINCE LAST CHECK.  NO CHANGES MADE.  ROV IN 3 MTHS W/CLINIC. Vella Kohler  July 11, 2009 11:07 AM  Impression & Recommendations:  Problem # 1:  ATRIAL FIBRILLATION (ICD-427.31) hg sinus rhythm on her up titrated dose of Tikosyn; this medication has been represcribed. We will continue her other medications as they are.  most recent potassium was 4.2; we'll check this at her next visit  Problem # 2:  SYSTOLIC HEART FAILURE, CHRONIC (ICD-428.22) stable on her current meds Her updated medication list for this problem includes:    Lotensin 10 Mg Tabs (Benazepril hcl) .Marland Kitchen... Take 1 tablet by mouth two times a day    Lasix 20 Mg Tabs (Furosemide) .Marland Kitchen... Take 1 tablet by mouth once a day    Coumadin 5 Mg Tabs (Warfarin sodium) .Marland Kitchen... Alternate 5 mg and 2.5 every other day    Coreg 12.5 Mg Tabs (Carvedilol) .Marland Kitchen... Take one tablet by mouth two times a day    Aspirin 81 Mg Tbec (Aspirin) .Marland Kitchen... Take one tablet by mouth every other day    Tikosyn 250 Mcg Caps (Dofetilide) .Marland Kitchen... 1 two times a day    Tikosyn 125 Mcg Caps (Dofetilide) .Marland Kitchen... Take 1 capsule by mouth two times a day  Problem # 3:  IMPLANTATION OF DEFIBRILLATOR, ST. JUDE ATLAS V343 (ICD-V45.02) Device parameters and data were reviewed and no changes were made  no intercurrent a  Problem # 4:  HYPERTROPHIC OBSTRUCTIVE CARDIOMYOPATHY (ICD-425.1) continue her current medications   Patient Instructions: 1)  Your physician wants you to follow-up in:  4  months with Dr  Graciela Husbands. You will receive a reminder letter in the mail two months in advance. If you don't receive a letter, please call our office to schedule the follow-up appointment.

## 2010-02-11 NOTE — Letter (Signed)
Summary: Sandra [name]** Anticoagulation Modification Letter  [facility]  [address] [place], [place] [address]   Phone: [phone]  Fax: [phone]    January 21, 2009  Re:    [name] DOB:    [birth year] MRN:    [id number]    Dear Graciela Husbands:  We have scheduled the above patient for an endoscopic procedure. Our records show that  he/she is on anticoagulation therapy. Please advise as to how long the patient may come off their therapy of  Coumadin prior to her colonoscopy.   Please route the completed form to Darcey Nora, Charity fundraiser at [facility].  Thank you for your help with this matter.  Sincerely,  Darcey Nora RN, [facility]   Physician Recommendation:  Hold Plavix 7 days prior ________________  Hold Coumadin 5 days prior ____________  Other ______________________________     Appended Document: **SK** Anticoagulation Modification Letter OK per Dr. Graciela Husbands to hold Coumadin for 5 days. Form faxed to ATTN Darcey Nora.

## 2010-02-11 NOTE — Assessment & Plan Note (Signed)
Summary: pt/njr   Nurse Visit   Allergies: 1)  ! Novocain 2)  ! Niacin 3)  ! * Statins 4)  ! Altace 5)  ! Codeine Laboratory Results   Blood Tests      INR: 1.8   (Normal Range: 0.88-1.12   Therap INR: 2.0-3.5) Comments: Rita Ohara  August 20, 2009 9:58 AM     Orders Added: 1)  Est. Patient Level I [99211] 2)  Protime [81191YN]   ANTICOAGULATION RECORD PREVIOUS REGIMEN & LAB RESULTS Anticoagulation Diagnosis:  Atrial Fibrillation on  12/24/2008 Previous INR Goal Range:  2.0-3.0 on  08/24/2006 Previous INR:  1.9 on  07/22/2009 Previous Coumadin Dose(mg):  2.5mg ,5mg  alt. on  03/12/2009 Previous Regimen:  same on  06/24/2009 Previous Coagulation Comments:  OV on  09/01/2007  NEW REGIMEN & LAB RESULTS Current INR: 1.8 Regimen: same  Repeat testing in: 4 weeks  Anticoagulation Visit Questionnaire Coumadin dose missed/changed:  No Abnormal Bleeding Symptoms:  No  Any diet changes including alcohol intake, vegetables or greens since the last visit:  No Any illnesses or hospitalizations since the last visit:  No Any signs of clotting since the last visit (including chest discomfort, dizziness, shortness of breath, arm tingling, slurred speech, swelling or redness in leg):  No  MEDICATIONS LOTENSIN 10 MG  TABS (BENAZEPRIL HCL) Take 1 tablet by mouth two times a day LASIX 20 MG  TABS (FUROSEMIDE) Take 1 tablet by mouth once a day ZETIA 10 MG  TABS (EZETIMIBE) Take 1 tablet by mouth once a day DOVONEX   SOLN (CALCIPOTRIENE SOLN) as directed COUMADIN 5 MG  TABS (WARFARIN SODIUM) alternate 5 mg and 2.5 every other day COREG 12.5 MG  TABS (CARVEDILOL) take one tablet by mouth two times a day PROCTOSOL HC 2.5 %  CREA (HYDROCORTISONE) as needed TRANXENE-T 7.5 MG  TABS (CLORAZEPATE DIPOTASSIUM) once daily as needed SYNTHROID 100 MCG  TABS (LEVOTHYROXINE SODIUM) once daily [BMN] ASPIRIN 81 MG TBEC (ASPIRIN) Take one tablet by mouth every other day VITAMIN E 400 UNIT  CAPS (VITAMIN E) take one capsule by mouth daily FISH OIL 1000 MG CAPS (OMEGA-3 FATTY ACIDS) take one capsule by mouth daily TIKOSYN 250 MCG CAPS (DOFETILIDE) 1 two times a day TIKOSYN 125 MCG CAPS (DOFETILIDE) Take 1 capsule by mouth two times a day

## 2010-02-11 NOTE — Assessment & Plan Note (Signed)
Summary: rov/jss  Medications Added COUMADIN 5 MG  TABS (WARFARIN SODIUM) alternate 5 mg and 2.5 every other day      Allergies Added:   Referring Provider:  Darryll Capers, MD Primary Provider:  Darryll Capers, MD  CC:  rov/.  History of Present Illness: Mrs. Viles is seen in followup for congestive heart failure in the setting of hypertrophic cardiomyopathy with depressed left ventricular function. She is status post a COPD implantation. She is on Coumadin. Because of recurrences of atrial fibrillation for the fall and winter, she is also a put on dofetilide in February. She has been maintaining sinus rhythm since.  Unfortunately however, it has not been associated with the overall sense of well-being with which she thinks of sinus rhythm. This may be partly related to get frequent extra beats which by ECG turn out to be PVCs.  There has been no bursal edema. Her shortness of breath is stable.  Current Medications (verified): 1)  Lotensin 10 Mg  Tabs (Benazepril Hcl) .... Take 1 Tablet By Mouth Two Times A Day 2)  Lasix 20 Mg  Tabs (Furosemide) .... Take 1 Tablet By Mouth Once A Day 3)  Zetia 10 Mg  Tabs (Ezetimibe) .... Take 1 Tablet By Mouth Once A Day 4)  Dovonex   Soln (Calcipotriene Soln) .... As Directed 5)  Coumadin 5 Mg  Tabs (Warfarin Sodium) .... Alternate 5 Mg and 2.5 Every Other Day 6)  Coreg 12.5 Mg  Tabs (Carvedilol) .... Take One Tablet By Mouth Two Times A Day 7)  Proctosol Hc 2.5 %  Crea (Hydrocortisone) .... As Needed 8)  Tranxene-T 7.5 Mg  Tabs (Clorazepate Dipotassium) .... Once Daily As Needed 9)  Synthroid 100 Mcg  Tabs (Levothyroxine Sodium) .... Once Daily 10)  Aspirin 81 Mg Tbec (Aspirin) .... Take One Tablet By Mouth Every Other Day 11)  Vitamin E 400 Unit Caps (Vitamin E) .... Take One Capsule By Mouth Daily 12)  Fish Oil 1000 Mg Caps (Omega-3 Fatty Acids) .... Take One Capsule By Mouth Daily 13)  Tikosyn 250 Mcg Caps (Dofetilide) .Marland Kitchen.. 1 Two Times A  Day  Allergies (verified): 1)  ! Novocain 2)  ! Niacin 3)  ! * Statins 4)  ! Altace 5)  ! Codeine  Past History:  Past Medical History: Last updated: 11/19/2008 Hyperlipidemia Atrial fibrillation Hypothyroidism Bradycardia HYpertrophic nonobstructive cardiomyopathy Anal Fissure Adenomatous Colon Polyps Diverticulosis Thyroid disease AICD- [employer] [phone]  Past Surgical History: Last updated: 11/19/2008 pacer/defibrilator 11/07 Permanent pacemaker-[employer]  Family History: Last updated: 02/12/2009 Family History of Heart Disease: Mother and multiple family members on mother side.  Family History of Colon Cancer: Father had malignant polyp removed via colonoscopy in his 70's ("cancer cells in the polyp" per patient)  Social History: Last updated: 09/10/2008 Retired Married Alcohol use-no Drug use-no Patient is a former smoker.  Patient does not get regular exercise.   Vital Signs:  Patient profile:   77 year old female Height:      65 inches Weight:      149 pounds BMI:     24.88 Pulse rate:   70 / minute Pulse rhythm:   regular BP sitting:   124 / 82  (left arm) Cuff size:   regular  Vitals Entered By: Judithe Modest CMA (April 30, 2009 3:18 PM)  Physical Exam  General:  The patient was alert and oriented in no acute distress. HEENT Normal.  Neck veins were flat, carotids were brisk.  Lungs were clear.  Heart sounds were regular without murmurs or gallops.  Abdomen was soft with active bowel sounds. There is no clubbing cyanosis or edema. Skin Warm and dry    EKG  Procedure date:  04/30/2009  Findings:      AV pacing with isolated PVCs with a right bundle branch block appearance and left axis deviation. QT Interval was 468 with a QTC of 505 with a QRS rate of 158   ICD Specifications Following MD:  Sherryl Manges, MD     ICD Vendor:  [employer]     ICD Model Number:  [phone]     ICD Serial Number:  [id number] ICD DOI:  11/12/2005     ICD Implanting  MD:  Sherryl Manges, MD  Lead 1:    Location: [facility]     DOI: 11/12/2005     Model #: [id number]     Serial #: [id number]     Status: active Lead 2:    Location: [facility]     DOI: 11/12/2005     Model #: [id number]     Serial #: [id number]     Status: active Lead 3:    Location: [facility]     DOI: 11/12/2005     Model #: [id number]     Serial #: [id number]     Status: active  Indications::  HCM,CHF   ICD Follow Up Remote Check?  No Battery Voltage:  2.59 V     Charge Time:  12 seconds     ICD Dependent:  No       ICD Device Measurements Atrium:  Amplitude: 3.0 mV, Impedance: 440 ohms, Threshold: 1.0 V at 0.5 msec Right Ventricle:  Amplitude: 11.3 mV, Impedance: 375 ohms, Threshold: 0.75 V at 0.5 msec Left Ventricle:  Impedance: 650 ohms, Threshold: 0.5 V at 0.5 msec  Episodes MS Episodes:  2     Percent Mode Switch:  8%     Coumadin:  Yes Shock:  0     ATP:  0     Nonsustained:  0     Atrial Pacing:  97%     Ventricular Pacing:  100%  Brady Parameters Mode DDDR     Lower Rate Limit:  70     Upper Rate Limit 115 PAV 200     Sensed AV Delay:  150  Tachy Zones VF:  231     VT:  200     VT1:  171     Next Cardiology Appt Due:  07/12/2009 Tech Comments:  No parameter changes.  2 mode switch episodes one lasting > 5 days, both on 2/17.  ROV 3 months, clinic. Altha Harm, LPN  April 30, 2009 3:31 PM   Impression & Recommendations:  Problem # 1:  ATRIAL FIBRILLATION (ICD-427.31) She is holding sinus rhythm on Tikosyn. We will check her potassium and magnesium levels today. Her updated medication list for this problem includes:    Coumadin 5 Mg Tabs (Warfarin sodium) .Marland Kitchen... Alternate 5 mg and 2.5 every other day    Coreg 12.5 Mg Tabs (Carvedilol) .Marland Kitchen... Take one tablet by mouth two times a day    Aspirin 81 Mg Tbec (Aspirin) .Marland Kitchen... Take one tablet by mouth every other day    Tikosyn 250 Mcg Caps (Dofetilide) .Marland Kitchen... 1 two times a day  Orders: EKG w/ Interpretation (93000) TLB-BMP (Basic Metabolic Panel-BMET)  (80048-METABOL) TLB-Magnesium (Mg) (83735-MG)  Problem # 2:  PREMATURE VENTRICULAR CONTRACTIONS (ICD-427.69) she is having symptomatic PVCs.  Hopefully the potassium and magnesium levels will give [place] a treatment target otherwise I've had tried to convince her that they're benign. Lying on her right side when she sleeps may help Her updated medication list for this problem includes:    Lotensin 10 Mg Tabs (Benazepril hcl) .Marland Kitchen... Take 1 tablet by mouth two times a day    Coumadin 5 Mg Tabs (Warfarin sodium) .Marland Kitchen... Alternate 5 mg and 2.5 every other day    Coreg 12.5 Mg Tabs (Carvedilol) .Marland Kitchen... Take one tablet by mouth two times a day    Aspirin 81 Mg Tbec (Aspirin) .Marland Kitchen... Take one tablet by mouth every other day    Tikosyn 250 Mcg Caps (Dofetilide) .Marland Kitchen... 1 two times a day  Problem # 3:  IMPLANTATION OF DEFIBRILLATOR, [employer] V343 (ICD-V45.02) Device parameters and data were reviewed and no changes were made  Problem # 4:  SYSTOLIC HEART FAILURE, CHRONIC (ICD-428.22) stable Her updated medication list for this problem includes:    Lotensin 10 Mg Tabs (Benazepril hcl) .Marland Kitchen... Take 1 tablet by mouth two times a day    Lasix 20 Mg Tabs (Furosemide) .Marland Kitchen... Take 1 tablet by mouth once a day    Coumadin 5 Mg Tabs (Warfarin sodium) .Marland Kitchen... Alternate 5 mg and 2.5 every other day    Coreg 12.5 Mg Tabs (Carvedilol) .Marland Kitchen... Take one tablet by mouth two times a day    Aspirin 81 Mg Tbec (Aspirin) .Marland Kitchen... Take one tablet by mouth every other day    Tikosyn 250 Mcg Caps (Dofetilide) .Marland Kitchen... 1 two times a day  Patient Instructions: 1)  Your physician recommends that you return for lab work today. 2)  Your physician recommends that you schedule a follow-up appointment in: 3 months with device clinic.

## 2010-02-11 NOTE — Assessment & Plan Note (Signed)
Summary: pt/njr/pt rescd/ccm   Nurse Visit   Allergies: 1)  ! Novocain 2)  ! Niacin 3)  ! * Statins 4)  ! Altace 5)  ! Codeine Laboratory Results   Blood Tests   Date/Time Received: July 22, 2009 11:58 AM  Date/Time Reported: July 22, 2009 11:58 AM    INR: 1.9   (Normal Range: 0.88-1.12   Therap INR: 2.0-3.5) Comments: Wynona Canes, CMA  July 22, 2009 11:58 AM     Orders Added: 1)  Est. Patient Level I [99211] 2)  Protime [16109UE]  Laboratory Results   Blood Tests      INR: 1.9   (Normal Range: 0.88-1.12   Therap INR: 2.0-3.5) Comments: Wynona Canes, CMA  July 22, 2009 11:58 AM       ANTICOAGULATION RECORD PREVIOUS REGIMEN & LAB RESULTS Anticoagulation Diagnosis:  Atrial Fibrillation on  12/24/2008 Previous INR Goal Range:  2.0-3.0 on  08/24/2006 Previous INR:  2.6 on  06/24/2009 Previous Coumadin Dose(mg):  2.5mg ,5mg  alt. on  03/12/2009 Previous Regimen:  same on  06/24/2009 Previous Coagulation Comments:  OV on  09/01/2007  NEW REGIMEN & LAB RESULTS Current INR: 1.9 Regimen: same  (no change)       Repeat testing in: 4 weeks MEDICATIONS LOTENSIN 10 MG  TABS (BENAZEPRIL HCL) Take 1 tablet by mouth two times a day LASIX 20 MG  TABS (FUROSEMIDE) Take 1 tablet by mouth once a day ZETIA 10 MG  TABS (EZETIMIBE) Take 1 tablet by mouth once a day DOVONEX   SOLN (CALCIPOTRIENE SOLN) as directed COUMADIN 5 MG  TABS (WARFARIN SODIUM) alternate 5 mg and 2.5 every other day COREG 12.5 MG  TABS (CARVEDILOL) take one tablet by mouth two times a day PROCTOSOL HC 2.5 %  CREA (HYDROCORTISONE) as needed TRANXENE-T 7.5 MG  TABS (CLORAZEPATE DIPOTASSIUM) once daily as needed SYNTHROID 100 MCG  TABS (LEVOTHYROXINE SODIUM) once daily [BMN] ASPIRIN 81 MG TBEC (ASPIRIN) Take one tablet by mouth every other day VITAMIN E 400 UNIT CAPS (VITAMIN E) take one capsule by mouth daily FISH OIL 1000 MG CAPS (OMEGA-3 FATTY ACIDS) take one capsule by mouth daily TIKOSYN  250 MCG CAPS (DOFETILIDE) 1 two times a day TIKOSYN 125 MCG CAPS (DOFETILIDE) Take 1 capsule by mouth two times a day   Anticoagulation Visit Questionnaire      Coumadin dose missed/changed:  No      Abnormal Bleeding Symptoms:  No   Any diet changes including alcohol intake, vegetables or greens since the last visit:  No Any illnesses or hospitalizations since the last visit:  No Any signs of clotting since the last visit (including chest discomfort, dizziness, shortness of breath, arm tingling, slurred speech, swelling or redness in leg):  No

## 2010-02-11 NOTE — Assessment & Plan Note (Signed)
Summary: 3 month rov/njr   Vital Signs:  Patient profile:   77 year old female Height:      65 inches Weight:      150 pounds Temp:     98.2 degrees F oral Pulse rate:   76 / minute Pulse rhythm:   regular Resp:     14 per minute BP sitting:   126 / 72  (left arm) Cuff size:   regular  Vitals Entered By: Willy Eddy, LPN (July 01, 2009 9:34 AM) CC: roa-had cardioversion last week and then not feeling weel friday    Primary Care Provider:  Darryll Capers, MD  CC:  roa-had cardioversion last week and then not feeling weel friday .  History of Present Illness: The pt is in 100% paced rythm the pt has been on antiarthmic the rate has been slow an the pacer has controlled the rate the pt has been on branded synthroid is getting the ticosyn mail order and is concerned about how it will push her into the donut hole Breathing has been better had a virus and bronchitis ( she had increased rapid rate last week and the ticosyn was increased per cardiology we will scan in the EKG and flag cardiology  Preventive Screening-Counseling & Management  Alcohol-Tobacco     Smoking Status: quit     Passive Smoke Exposure: no  Problems Prior to Update: 1)  Hypotension  (ICD-458.9) 2)  Premature Ventricular Contractions  (ICD-427.69) 3)  Cellulitis and Abscess of Unspecified Site  (ICD-682.9) 4)  Dog Bite  (ICD-E906.0) 5)  Systolic Heart Failure, Chronic  (ICD-428.22) 6)  Implantation of Defibrillator, St. Jude Atlas V343  (ICD-V45.02) 7)  Atrial Fibrillation  (ICD-427.31) 8)  Hypertrophic Obstructive Cardiomyopathy  (ICD-425.1) 9)  Cough Variant Asthma  (ICD-493.82) 10)  Obst Chronic Bronchitis W/acute Bronchitis  (ICD-491.22) 11)  Cough  (ICD-786.2) 12)  Coumadin Therapy  (ICD-V58.61) 13)  Personal History of Colonic Polyps  (ICD-V12.72) 14)  Unspecified Labyrinthine Dysfunction  (ICD-386.50) 15)  Lumbosacral Strain  (ICD-846.0) 16)  Diverticulitis, Acute  (ICD-562.11) 17)   Contusion of Upper Arm  (ICD-923.03) 18)  Loc Osteoarthros Not Spec Prim/sec Lower Leg  (ICD-715.36) 19)  Contusion of Hip  (ICD-924.01) 20)  Uns Advrs Eff Uns Rx Medicinal&biological Sbstnc  (ICD-995.20) 21)  Aftercare, Long-term Use, Anticoagulants  (ICD-V58.61) 22)  Encounter For Therapeutic Drug Monitoring  (ICD-V58.83) 23)  Hyperlipidemia  (ICD-272.4) 24)  Hypothyroidism  (ICD-244.9)  Current Problems (verified): 1)  Hypotension  (ICD-458.9) 2)  Premature Ventricular Contractions  (ICD-427.69) 3)  Cellulitis and Abscess of Unspecified Site  (ICD-682.9) 4)  Dog Bite  (ICD-E906.0) 5)  Systolic Heart Failure, Chronic  (ICD-428.22) 6)  Implantation of Defibrillator, St. Jude Atlas V343  (ICD-V45.02) 7)  Atrial Fibrillation  (ICD-427.31) 8)  Hypertrophic Obstructive Cardiomyopathy  (ICD-425.1) 9)  Cough Variant Asthma  (ICD-493.82) 10)  Obst Chronic Bronchitis W/acute Bronchitis  (ICD-491.22) 11)  Cough  (ICD-786.2) 12)  Coumadin Therapy  (ICD-V58.61) 13)  Personal History of Colonic Polyps  (ICD-V12.72) 14)  Unspecified Labyrinthine Dysfunction  (ICD-386.50) 15)  Lumbosacral Strain  (ICD-846.0) 16)  Diverticulitis, Acute  (ICD-562.11) 17)  Contusion of Upper Arm  (ICD-923.03) 18)  Loc Osteoarthros Not Spec Prim/sec Lower Leg  (ICD-715.36) 19)  Contusion of Hip  (ICD-924.01) 20)  Uns Advrs Eff Uns Rx Medicinal&biological Sbstnc  (ICD-995.20) 21)  Aftercare, Long-term Use, Anticoagulants  (ICD-V58.61) 22)  Encounter For Therapeutic Drug Monitoring  (ICD-V58.83) 23)  Hyperlipidemia  (ICD-272.4) 24)  Hypothyroidism  (ICD-244.9)  Medications Prior to Update: 1)  Lotensin 10 Mg  Tabs (Benazepril Hcl) .... Take 1 Tablet By Mouth Two Times A Day 2)  Lasix 20 Mg  Tabs (Furosemide) .... Take 1 Tablet By Mouth Once A Day 3)  Zetia 10 Mg  Tabs (Ezetimibe) .... Take 1 Tablet By Mouth Once A Day 4)  Dovonex   Soln (Calcipotriene Soln) .... As Directed 5)  Coumadin 5 Mg  Tabs (Warfarin  Sodium) .... Alternate 5 Mg and 2.5 Every Other Day 6)  Coreg 12.5 Mg  Tabs (Carvedilol) .... Take One Tablet By Mouth Two Times A Day 7)  Proctosol Hc 2.5 %  Crea (Hydrocortisone) .... As Needed 8)  Tranxene-T 7.5 Mg  Tabs (Clorazepate Dipotassium) .... Once Daily As Needed 9)  Synthroid 100 Mcg  Tabs (Levothyroxine Sodium) .... Once Daily 10)  Aspirin 81 Mg Tbec (Aspirin) .... Take One Tablet By Mouth Every Other Day 11)  Vitamin E 400 Unit Caps (Vitamin E) .... Take One Capsule By Mouth Daily 12)  Fish Oil 1000 Mg Caps (Omega-3 Fatty Acids) .... Take One Capsule By Mouth Daily 13)  Tikosyn 250 Mcg Caps (Dofetilide) .Marland Kitchen.. 1 Two Times A Day 14)  Tikosyn 125 Mcg Caps (Dofetilide) .... Take 1 Capsule By Mouth Two Times A Day  Current Medications (verified): 1)  Lotensin 10 Mg  Tabs (Benazepril Hcl) .... Take 1 Tablet By Mouth Two Times A Day 2)  Lasix 20 Mg  Tabs (Furosemide) .... Take 1 Tablet By Mouth Once A Day 3)  Zetia 10 Mg  Tabs (Ezetimibe) .... Take 1 Tablet By Mouth Once A Day 4)  Dovonex   Soln (Calcipotriene Soln) .... As Directed 5)  Coumadin 5 Mg  Tabs (Warfarin Sodium) .... Alternate 5 Mg and 2.5 Every Other Day 6)  Coreg 12.5 Mg  Tabs (Carvedilol) .... Take One Tablet By Mouth Two Times A Day 7)  Proctosol Hc 2.5 %  Crea (Hydrocortisone) .... As Needed 8)  Tranxene-T 7.5 Mg  Tabs (Clorazepate Dipotassium) .... Once Daily As Needed 9)  Synthroid 100 Mcg  Tabs (Levothyroxine Sodium) .... Once Daily 10)  Aspirin 81 Mg Tbec (Aspirin) .... Take One Tablet By Mouth Every Other Day 11)  Vitamin E 400 Unit Caps (Vitamin E) .... Take One Capsule By Mouth Daily 12)  Fish Oil 1000 Mg Caps (Omega-3 Fatty Acids) .... Take One Capsule By Mouth Daily 13)  Tikosyn 250 Mcg Caps (Dofetilide) .Marland Kitchen.. 1 Two Times A Day 14)  Tikosyn 125 Mcg Caps (Dofetilide) .... Take 1 Capsule By Mouth Two Times A Day  Allergies (verified): 1)  ! Novocain 2)  ! Niacin 3)  ! * Statins 4)  ! Altace 5)  !  Codeine  Past History:  Family History: Last updated: 06/24/2009 Family History of Heart Disease: Mother and multiple family members on mother side.  Family History of Colon Cancer: Father had malignant polyp removed via colonoscopy in his 3's ("cancer cells in the polyp" per patient) Daughter with cardiomyopathy  Social History: Last updated: 09/10/2008 Retired Married Alcohol use-no Drug use-no Patient is a former smoker.  Patient does not get regular exercise.   Risk Factors: Exercise: no (09/10/2008)  Risk Factors: Smoking Status: quit (07/01/2009) Passive Smoke Exposure: no (07/01/2009)  Past medical, surgical, family and social histories (including risk factors) reviewed, and no changes noted (except as noted below).  Past Medical History: Reviewed history from 06/24/2009 and no changes required. Hyperlipidemia Atrial fibrillation Hypothyroidism Bradycardia  Hypertrophic nonobstructive cardiomyopathy Anal Fissure Adenomatous Colon Polyps Diverticulosis Thyroid disease AICD- St. Jude Atlas v343 Systolic heart failure (EF 40-45%)  Past Surgical History: Reviewed history from 06/24/2009 and no changes required. Pacer/defibrilator 11/07 St Jude  Family History: Reviewed history from 06/24/2009 and no changes required. Family History of Heart Disease: Mother and multiple family members on mother side.  Family History of Colon Cancer: Father had malignant polyp removed via colonoscopy in his 62's ("cancer cells in the polyp" per patient) Daughter with cardiomyopathy  Social History: Reviewed history from 09/10/2008 and no changes required. Retired Married Alcohol use-no Drug use-no Patient is a former smoker.  Patient does not get regular exercise.   Review of Systems  The patient denies anorexia, fever, weight loss, weight gain, vision loss, decreased hearing, hoarseness, chest pain, syncope, dyspnea on exertion, peripheral edema, prolonged cough,  headaches, hemoptysis, abdominal pain, melena, hematochezia, severe indigestion/heartburn, hematuria, incontinence, genital sores, muscle weakness, suspicious skin lesions, transient blindness, difficulty walking, depression, unusual weight change, abnormal bleeding, enlarged lymph nodes, angioedema, and breast masses.    Physical Exam  General:  Well-developed,well-nourished,in no acute distress; alert,appropriate and cooperative throughout examination Eyes:  pupils equal and pupils reactive to light.   Ears:  R ear normal and L ear normal.   Mouth:  Oral mucosa and oropharynx without lesions or exudates.  Teeth in good repair. Neck:  supple and full ROM.   Lungs:  normal respiratory effort, no intercostal retractions, and no wheezes.   Heart:  normal rate and regular rhythm.   Abdomen:  Bowel sounds positive,abdomen soft and non-tender without masses, organomegaly or hernias noted. Msk:  No deformity or scoliosis noted of thoracic or lumbar spine.   Pulses:  R and L carotid,radial,femoral,dorsalis pedis and posterior tibial pulses are full and equal bilaterally Extremities:  No clubbing, cyanosis, edema, or deformity noted with normal full range of motion of all joints.   Neurologic:  No cranial nerve deficits noted. Station and gait are normal. Plantar reflexes are down-going bilaterally. DTRs are symmetrical throughout. Sensory, motor and coordinative functions appear intact.   Impression & Recommendations:  Problem # 1:  PREMATURE VENTRICULAR CONTRACTIONS (ICD-427.69) note the cardilogy phone note and scan the EKG  Her updated medication list for this problem includes:    Coumadin 5 Mg Tabs (Warfarin sodium) .Marland Kitchen... Alternate 5 mg and 2.5 every other day    Coreg 12.5 Mg Tabs (Carvedilol) .Marland Kitchen... Take one tablet by mouth two times a day    Aspirin 81 Mg Tbec (Aspirin) .Marland Kitchen... Take one tablet by mouth every other day    Tikosyn 250 Mcg Caps (Dofetilide) .Marland Kitchen... 1 two times a day    Tikosyn  125 Mcg Caps (Dofetilide) .Marland Kitchen... Take 1 capsule by mouth two times a day  Echocardiogram:   SUMMARY   -  Overall left ventricular systolic function was mildly to         moderately decreased. Left ventricular ejection fraction was         estimated , range being 40 % to 45 %. severe hypokinesis of         the anterolateral and anteroseptal segments.   -  There was mild mitral annular calcification. There was mild         mitral valvular regurgitation.   -  The left atrium was moderately dilated.   -  The right ventricle was mildly dilated. There was mild right         ventricular hypertrophy. The estimated  peak right ventricular         systolic pressure not ideally defined; probably mildly         increased. pacing wire in the right ventricle.   -  There was mild tricuspid valve prolapse.   -  The right atrium was mildly dilated.   COMPARISONS   -  Compared to the previous study of 23-Feb-2002 :LV thrombus no         longer seen; LV systolic function has improved. Pacing wire         now present.    ---------------------------------------------------------------   Prepared and Electronically Authenticated by   [place] Bing M.D. (11/23/2005)  Labs Reviewed: Na: 143 (06/24/2009)   K+: 4.2 (06/24/2009)   CL: 109 (06/24/2009)   HCO3: 26 (06/24/2009) Mg: 2.2 (06/24/2009)   Ca: 9.1 (06/24/2009)   TSH: 0.17 (09/04/2008)   HCO3: 26 (06/24/2009)  Problem # 2:  ATRIAL FIBRILLATION (ICD-427.31) paced rythmn Her updated medication list for this problem includes:    Coumadin 5 Mg Tabs (Warfarin sodium) .Marland Kitchen... Alternate 5 mg and 2.5 every other day    Coreg 12.5 Mg Tabs (Carvedilol) .Marland Kitchen... Take one tablet by mouth two times a day    Aspirin 81 Mg Tbec (Aspirin) .Marland Kitchen... Take one tablet by mouth every other day    Tikosyn 250 Mcg Caps (Dofetilide) .Marland Kitchen... 1 two times a day    Tikosyn 125 Mcg Caps (Dofetilide) .Marland Kitchen... Take 1 capsule by mouth two times a day  Orders: EKG w/ Interpretation  (93000)  Problem # 3:  SYSTOLIC HEART FAILURE, CHRONIC (ICD-428.22)  stable without any signes of edema Her updated medication list for this problem includes:    Lotensin 10 Mg Tabs (Benazepril hcl) .Marland Kitchen... Take 1 tablet by mouth two times a day    Lasix 20 Mg Tabs (Furosemide) .Marland Kitchen... Take 1 tablet by mouth once a day    Coumadin 5 Mg Tabs (Warfarin sodium) .Marland Kitchen... Alternate 5 mg and 2.5 every other day    Coreg 12.5 Mg Tabs (Carvedilol) .Marland Kitchen... Take one tablet by mouth two times a day    Aspirin 81 Mg Tbec (Aspirin) .Marland Kitchen... Take one tablet by mouth every other day  Echocardiogram:   SUMMARY   -  Overall left ventricular systolic function was mildly to         moderately decreased. Left ventricular ejection fraction was         estimated , range being 40 % to 45 %. severe hypokinesis of         the anterolateral and anteroseptal segments.   -  There was mild mitral annular calcification. There was mild         mitral valvular regurgitation.   -  The left atrium was moderately dilated.   -  The right ventricle was mildly dilated. There was mild right         ventricular hypertrophy. The estimated peak right ventricular         systolic pressure not ideally defined; probably mildly         increased. pacing wire in the right ventricle.   -  There was mild tricuspid valve prolapse.   -  The right atrium was mildly dilated.   COMPARISONS   -  Compared to the previous study of 23-Feb-2002 :LV thrombus no         longer seen; LV systolic function has improved. Pacing wire         now present.    ---------------------------------------------------------------  Prepared and Electronically Authenticated by   [place] Bing M.D. (11/23/2005)  Problem # 4:  HYPERLIPIDEMIA (ICD-272.4) samples Her updated medication list for this problem includes:    Zetia 10 Mg Tabs (Ezetimibe) .Marland Kitchen... Take 1 tablet by mouth once a day  Labs Reviewed: SGOT: 35 (06/24/2009)   SGPT: 30 (06/24/2009)  Lipid  Goals: Chol Goal: 200 (05/10/2007)   HDL Goal: 40 (05/10/2007)   LDL Goal: 130 (05/10/2007)   TG Goal: 150 (05/10/2007)  Prior 10 Yr Risk Heart Disease: Not enough information (05/10/2007)   HDL:34.90 (06/24/2009), 43.90 (09/04/2008)  LDL:124 (06/24/2009), DEL (07/04/2007)  Chol:185 (06/24/2009), 240 (09/04/2008)  Trig:130.0 (06/24/2009), 130 (07/04/2007)  Complete Medication List: 1)  Lotensin 10 Mg Tabs (Benazepril hcl) .... Take 1 tablet by mouth two times a day 2)  Lasix 20 Mg Tabs (Furosemide) .... Take 1 tablet by mouth once a day 3)  Zetia 10 Mg Tabs (Ezetimibe) .... Take 1 tablet by mouth once a day 4)  Dovonex Soln (Calcipotriene soln) .... As directed 5)  Coumadin 5 Mg Tabs (Warfarin sodium) .... Alternate 5 mg and 2.5 every other day 6)  Coreg 12.5 Mg Tabs (Carvedilol) .... Take one tablet by mouth two times a day 7)  Proctosol Hc 2.5 % Crea (Hydrocortisone) .... As needed 8)  Tranxene-t 7.5 Mg Tabs (Clorazepate dipotassium) .... Once daily as needed 9)  Synthroid 100 Mcg Tabs (Levothyroxine sodium) .... Once daily 10)  Aspirin 81 Mg Tbec (Aspirin) .... Take one tablet by mouth every other day 11)  Vitamin E 400 Unit Caps (Vitamin e) .... Take one capsule by mouth daily 12)  Fish Oil 1000 Mg Caps (Omega-3 fatty acids) .... Take one capsule by mouth daily 13)  Tikosyn 250 Mcg Caps (Dofetilide) .Marland Kitchen.. 1 two times a day 14)  Tikosyn 125 Mcg Caps (Dofetilide) .... Take 1 capsule by mouth two times a day  Patient Instructions: 1)  Please schedule a follow-up appointment in 3 months.

## 2010-02-11 NOTE — Consult Note (Signed)
Summary: Hermelinda Medicus MD  Hermelinda Medicus MD   Imported By: Lanelle Bal 11/22/2009 08:54:50  _____________________________________________________________________  External Attachment:    Type:   Image     Comment:   External Document

## 2010-02-11 NOTE — Progress Notes (Signed)
Summary: pt wnat to be seen today   Phone Note Call from Patient Call back at Home Phone [phone] Call back at cell# [phone]   [name]: Patient Reason for Call: Talk to Nurse, Talk to Doctor Summary of Call: pt not feeling well and thinks she is in a-fib again and wants to be seen today Initial call taken by: Omer Jack,  June 24, 2009 8:24 AM  Follow-up for Phone Call        Has been feeling bad since Friday.  Made appointment for her to see DOD today at 9:30 AM.  Pt aware and agrees to be here then. Follow-up by: Minerva Areola, RN, BSN,  June 24, 2009 8:45 AM

## 2010-02-11 NOTE — Letter (Signed)
Summary: Call-A-Nurse  Call-A-Nurse   Imported By: Maryln Gottron 02/06/2009 11:27:34  _____________________________________________________________________  External Attachment:    Type:   Image     Comment:   External Document

## 2010-02-11 NOTE — Progress Notes (Signed)
Summary: ? re her bt   Phone Note Outgoing Call Call back at [employer] [phone]   [name]: Patient Call For: Leone Payor Reason for Call: Talk to Nurse Summary of Call: Patient has questions regarding coumadin Initial call taken by: Tawni Levy,  January 28, 2009 8:56 AM Summary of Call: I spoke with patient and advised her we are waiting to hear back from Dr Graciela Husbands.  I have spoke with Duncan Dull at Dr Odessa Fleming office she will have Dr Graciela Husbands review and route the anticoag letter back to me Initial call taken by: Darcey Nora RN, CGRN,  January 28, 2009 9:08 AM  Follow-up for Phone Call        Patient  has been notified of Dr Odessa Fleming recommendations she will hold her coumadin starting 02-07-09 Follow-up by: Darcey Nora RN, CGRN,  January 31, 2009 9:13 AM

## 2010-02-11 NOTE — Cardiovascular Report (Signed)
Summary: Office Visit   Office Visit   Imported By: Roderic Ovens 03/05/2009 11:50:01  _____________________________________________________________________  External Attachment:    Type:   Image     Comment:   External Document

## 2010-02-11 NOTE — Assessment & Plan Note (Signed)
Summary: pt/njr   Nurse Visit   Allergies: 1)  ! Novocain 2)  ! Niacin 3)  ! * Statins 4)  ! Altace 5)  ! Codeine Laboratory Results   Blood Tests   Date/Time Received: May 07, 2009 12:20 PM  Date/Time Reported: May 07, 2009 12:20 PM   PT: 22.3 s   (Normal Range: 10.6-13.4)  INR: 3.4   (Normal Range: 0.88-1.12   Therap INR: 2.0-3.5) Comments: Wynona Canes, CMA  May 07, 2009 12:20 PM     Orders Added: 1)  Est. Patient Level I [99211] 2)  Protime [60454UJ]  Laboratory Results   Blood Tests     PT: 22.3 s   (Normal Range: 10.6-13.4)  INR: 3.4   (Normal Range: 0.88-1.12   Therap INR: 2.0-3.5) Comments: Wynona Canes, CMA  May 07, 2009 12:20 PM       ANTICOAGULATION RECORD PREVIOUS REGIMEN & LAB RESULTS Anticoagulation Diagnosis:  Atrial Fibrillation on  12/24/2008 Previous INR Goal Range:  2.0-3.0 on  08/24/2006 Previous INR:  2.5 on  04/09/2009 Previous Coumadin Dose(mg):  2.5mg ,5mg  alt. on  03/12/2009 Previous Regimen:  same dose on  03/12/2009 Previous Coagulation Comments:  OV on  09/01/2007  NEW REGIMEN & LAB RESULTS Current INR: 3.4 Regimen: same dose  (no change)       Repeat testing in: 2 weeks MEDICATIONS LOTENSIN 10 MG  TABS (BENAZEPRIL HCL) Take 1 tablet by mouth two times a day LASIX 20 MG  TABS (FUROSEMIDE) Take 1 tablet by mouth once a day ZETIA 10 MG  TABS (EZETIMIBE) Take 1 tablet by mouth once a day DOVONEX   SOLN (CALCIPOTRIENE SOLN) as directed COUMADIN 5 MG  TABS (WARFARIN SODIUM) alternate 5 mg and 2.5 every other day COREG 12.5 MG  TABS (CARVEDILOL) take one tablet by mouth two times a day PROCTOSOL HC 2.5 %  CREA (HYDROCORTISONE) as needed TRANXENE-T 7.5 MG  TABS (CLORAZEPATE DIPOTASSIUM) once daily as needed SYNTHROID 100 MCG  TABS (LEVOTHYROXINE SODIUM) once daily [BMN] ASPIRIN 81 MG TBEC (ASPIRIN) Take one tablet by mouth every other day VITAMIN E 400 UNIT CAPS (VITAMIN E) take one capsule by mouth daily FISH  OIL 1000 MG CAPS (OMEGA-3 FATTY ACIDS) take one capsule by mouth daily TIKOSYN 250 MCG CAPS (DOFETILIDE) 1 two times a day   Anticoagulation Visit Questionnaire      Coumadin dose missed/changed:  No      Abnormal Bleeding Symptoms:  No   Any diet changes including alcohol intake, vegetables or greens since the last visit:  No Any illnesses or hospitalizations since the last visit:  No Any signs of clotting since the last visit (including chest discomfort, dizziness, shortness of breath, arm tingling, slurred speech, swelling or redness in leg):  No

## 2010-02-11 NOTE — Letter (Signed)
Summary: [facility] Instructions  [facility]  [address] [place], [place] [address]   Phone: [phone]  Fax: [phone]       Sandra [name]    Nov 15, 1932    MRN: [id number]       Procedure Day /Date:  Tuesday 02/12/2009     Arrival Time: 9:00 am     Procedure Time: 10:00 am     Location of Procedure:                    _x _  [facility] (4th Floor)    PREPARATION FOR COLONOSCOPY WITH MIRALAX  Starting 5 days prior to your procedure Thursday 1/27 do not eat nuts, seeds, popcorn, corn, beans, peas,  salads, or any raw vegetables.  Do not take any fiber supplements (e.g. Metamucil, Citrucel, and Benefiber). ____________________________________________________________________________________________________   THE DAY BEFORE YOUR PROCEDURE         DATE: Monday 1/31  1   Drink clear liquids the entire day-NO SOLID FOOD  2   Do not drink anything colored red or purple.  Avoid juices with pulp.  No orange juice.  3   Drink at least 64 oz. (8 glasses) of fluid/clear liquids during the day to prevent dehydration and help the prep work efficiently.  CLEAR LIQUIDS INCLUDE: Water Jello Ice Popsicles Tea (sugar ok, no milk/cream) Powdered fruit flavored drinks Coffee (sugar ok, no milk/cream) Gatorade Juice: apple, white grape, white cranberry  Lemonade Clear bullion, consomm, broth Carbonated beverages (any kind) Strained chicken noodle soup Hard Candy  4   Mix the entire bottle of Miralax with 64 oz. of Gatorade/Powerade in the morning and put in the refrigerator to chill.  5   At 3:00 pm take 2 Dulcolax/Bisacodyl tablets.  6   At 4:30 pm take one Reglan/Metoclopramide tablet.  7  Starting at 5:00 pm drink one 8 oz glass of the Miralax mixture every 15-20 minutes until you have finished drinking the entire 64 oz.  You should finish drinking prep around 7:30 or 8:00 pm.  8   If you are nauseated, you may take the 2nd Reglan/Metoclopramide  tablet at 6:30 pm.        9    At 8:00 pm take 2 more DULCOLAX/Bisacodyl tablets.     THE DAY OF YOUR PROCEDURE      DATE:  Tuesday 2/1  You may drink clear liquids until 8:00 am  (2 HOURS BEFORE PROCEDURE).   MEDICATION INSTRUCTIONS  Unless otherwise instructed, you should take regular prescription medications with a small sip of water as early as possible the morning of your procedure.  .            OFFICE WILL CONTACT YOU ABOUT TAKING COUMADIN BEFORE PROCEDURE  Additional medication instructions:   Hold Lasix morning of procedure.         OTHER INSTRUCTIONS  You will need a responsible adult at least 75 years of age to accompany you and drive you home.   This person must remain in the waiting room during your procedure.  Wear loose fitting clothing that is easily removed.  Leave jewelry and other valuables at home.  However, you may wish to bring a book to read or an iPod/MP3 player to listen to music as you wait for your procedure to start.  Remove all body piercing jewelry and leave at home.  Total time from sign-in until discharge is approximately 2-3 hours.  You should go  home directly after your procedure and rest.  You can resume normal activities the day after your procedure.  The day of your procedure you should not:   Drive   Make legal decisions   Operate machinery   Drink alcohol   Return to work  You will receive specific instructions about eating, activities and medications before you leave.   The above instructions have been reviewed and explained to me by   Ezra Sites RN  January 21, 2009 11:07 AM     I fully understand and can verbalize these instructions _____________________________ Date _______

## 2010-02-11 NOTE — Cardiovascular Report (Signed)
Summary: Office Visit   Office Visit   Imported By: Roderic Ovens 11/14/2009 16:11:04  _____________________________________________________________________  External Attachment:    Type:   Image     Comment:   External Document

## 2010-02-11 NOTE — Assessment & Plan Note (Signed)
Summary: not feeling well since Friday      Allergies Added:   Visit Type:  Follow-up Primary Provider:  Darryll Capers, MD  CC:  Congestive heart failure.  History of Present Illness: The patient is a very pleasant patient of Dr. Stevie Kern.  She as a long history of a nonischemic cardiomyopathy. She has hypertrophic cardiomyopathy. She also has heart block and is status post ICD/CRT. She reports that she decompensates when she is in atrial fibrillation and she has been treated for this dysrhythmia. Most recently she was hospitalized and started on Tikosyn and seemed to be maintaining sinus rhythm. However, she notes that last Friday she felt palpitations in her usual weakness. She gets short of breath. She started having some PND. She did not have any presyncope or syncope. She knew that she was in atrial fibrillation then and her EKG demonstrates this. Device monitoring reports that she's been in fibrillation persistently for greater than 4 days. She's not having any chest pressure, neck or arm discomfort. She has only minimal lower extremity swelling. She doesn't weigh herself routinely. She did have her Coumadin level checked today and it was 2.6.  Current Medications (verified): 1)  Lotensin 10 Mg  Tabs (Benazepril Hcl) .... Take 1 Tablet By Mouth Two Times A Day 2)  Lasix 20 Mg  Tabs (Furosemide) .... Take 1 Tablet By Mouth Once A Day 3)  Zetia 10 Mg  Tabs (Ezetimibe) .... Take 1 Tablet By Mouth Once A Day 4)  Dovonex   Soln (Calcipotriene Soln) .... As Directed 5)  Coumadin 5 Mg  Tabs (Warfarin Sodium) .... Alternate 5 Mg and 2.5 Every Other Day 6)  Coreg 12.5 Mg  Tabs (Carvedilol) .... Take One Tablet By Mouth Two Times A Day 7)  Proctosol Hc 2.5 %  Crea (Hydrocortisone) .... As Needed 8)  Tranxene-T 7.5 Mg  Tabs (Clorazepate Dipotassium) .... Once Daily As Needed 9)  Synthroid 100 Mcg  Tabs (Levothyroxine Sodium) .... Once Daily 10)  Aspirin 81 Mg Tbec (Aspirin) .... Take One Tablet By  Mouth Every Other Day 11)  Vitamin E 400 Unit Caps (Vitamin E) .... Take One Capsule By Mouth Daily 12)  Fish Oil 1000 Mg Caps (Omega-3 Fatty Acids) .... Take One Capsule By Mouth Daily 13)  Tikosyn 250 Mcg Caps (Dofetilide) .Marland Kitchen.. 1 Two Times A Day  Allergies (verified): 1)  ! Novocain 2)  ! Niacin 3)  ! * Statins 4)  ! Altace 5)  ! Codeine  Past History:  Past Medical History: Hyperlipidemia Atrial fibrillation Hypothyroidism Bradycardia Hypertrophic nonobstructive cardiomyopathy Anal Fissure Adenomatous Colon Polyps Diverticulosis Thyroid disease AICD- [employer] Jude Atlas v343 Systolic heart failure (EF 40-45%)  Past Surgical History: Pacer/defibrilator 11/07 St Jude  Family History: Family History of Heart Disease: Mother and multiple family members on mother side.  Family History of Colon Cancer: Father had malignant polyp removed via colonoscopy in his 39's ("cancer cells in the polyp" per patient) Daughter with cardiomyopathy  Social History: Reviewed history from 09/10/2008 and no changes required. Retired Married Alcohol use-no Drug use-no Patient is a former smoker.  Patient does not get regular exercise.   Review of Systems       As stated in the HPI and negative for all other systems.   Vital Signs:  Patient profile:   77 year old female Height:      65 inches Weight:      152 pounds BMI:     25.39 Pulse rate:  81 / minute Resp:     16 per minute BP sitting:   88 / 66  (left arm)  Vitals Entered By: Marrion Coy, CNA (June 24, 2009 10:17 AM)  Physical Exam  General:  Well developed, well nourished, in no acute distress. Head:  normocephalic and atraumatic Eyes:  PERRLA/EOM intact; conjunctiva and lids normal. Mouth:  Teeth, gums and palate normal. Oral mucosa normal. Neck:  Neck supple, no JVD. No masses, thyromegaly or abnormal cervical nodes. Chest Wall:  Well-healed ICD pocket Lungs:  Clear bilaterally to auscultation and  percussion. Abdomen:  Bowel sounds positive; abdomen soft and non-tender without masses, organomegaly, or hernias noted. No hepatosplenomegaly. Msk:  Back normal, normal gait. Muscle strength and tone normal. Extremities:  No clubbing or cyanosis. Neurologic:  Alert and oriented x 3. Skin:  Intact without lesions or rashes. Cervical Nodes:  no significant adenopathy Inguinal Nodes:  no significant adenopathy Psych:  Normal affect.   Detailed Cardiovascular Exam  Neck    Carotids: Carotids full and equal bilaterally without bruits.      Neck Veins: Normal, no JVD.    Heart    Inspection: no deformities or lifts noted.      Palpation: normal PMI with no thrills palpable.      Auscultation: Irregular, S1 and S2 within normal limits, no S3, no clicks, rubs, 2/6 apical systolic murmur radiating up the aortic outflow tract, no diastolic murmurs  Vascular    Abdominal Aorta: no palpable masses, pulsations, or audible bruits.      Femoral Pulses: normal femoral pulses bilaterally.      Pedal Pulses: diminished right dorsalis pedis pulse, diminished right posterior tibial pulse, diminished left dorsalis pedis pulse, and diminished left posterior tibial pulse.      Radial Pulses: normal radial pulses bilaterally.      Peripheral Circulation: no clubbing, cyanosis, or edema noted with normal capillary refill.     EKG  Procedure date:  06/24/2009  Findings:      Atrial fibrillation with ventricular paced beats   ICD Specifications Following MD:  Sherryl Manges, MD     ICD Vendor:  St Jude     ICD Model Number:  [phone]     ICD Serial Number:  [id number] ICD DOI:  11/12/2005     ICD Implanting MD:  Sherryl Manges, MD  Lead 1:    Location: [facility]     DOI: 11/12/2005     Model #: [id number]     Serial #: [id number]     Status: active Lead 2:    Location: RV     DOI: 11/12/2005     Model #: 7000     Serial #: [id number]     Status: active Lead 3:    Location: LV     DOI: 11/12/2005     Model #: 4538     Serial  #: [id number]     Status: active  Indications::  HCM,CHF   ICD Follow Up ICD Dependent:  No      Episodes Coumadin:  Yes  Brady Parameters Mode DDDR     Lower Rate Limit:  70     Upper Rate Limit 115 PAV 200     Sensed AV Delay:  150  Tachy Zones VF:  231     VT:  200     VT1:  171     Impression & Recommendations:  Problem # 1:  ATRIAL FIBRILLATION (ICD-427.31) The patient is back in atrial fibrillation.  She does have a therapeutic Coumadin level. I will bring her back to the hospital for Dr. Graciela Husbands to do cardioversion tomorrow. I note that her glomerular filtration rate is 50. Thus higher doses of Tikosyn are precluded.  I will defer to Dr. Graciela Husbands for med changes or consideration of ablation. Orders: EKG w/ Interpretation (93000)  Problem # 2:  SYSTOLIC HEART FAILURE, CHRONIC (ICD-428.22) I do not suspect overt volume overload. For now she will continue on her previous meds.  Problem # 3:  HYPOTENSION (ICD-458.9) Today's bloodToday's blood pressure seems to be reflective of blood pressures when she goes into this rhythm. I will have her hold her p.m. Lotensin and a.m. dose until she sees Dr. Graciela Husbands.  Patient Instructions: 1)  Your physician recommends that you schedule a follow-up appointment after cardioversion with Dr Graciela Husbands 2)  Your physician recommends that you continue on your current medications as directed. Please refer to the Current Medication list given to you today. 3)  Your physician has recommended that you have a cardioversion (DCCV).  Electrical cardioversion uses a jolt of electricity to your heart either through paddles or wired patches attached to your chest. This is a controlled, usually prescheduled, procedure. Defibrillation is done under light anesthesia in the hospital, and you usually go home the day of the procedure. This is done to get your heart back into a normal rhythm. You are not awake for the procedure. Please see the instruction sheet given to you today. 4)   You have been diagnosed with atrial fibrillation.  Atrial fibrillation is a condition in which one of the upper chambers of the heart has extra electrical cells causing it to beat very fast.  Please see the handout/brochure given to you today for further information.

## 2010-02-11 NOTE — Cardiovascular Report (Signed)
Summary: Office Visit   Office Visit   Imported By: Roderic Ovens 11/11/2009 12:58:26  _____________________________________________________________________  External Attachment:    Type:   Image     Comment:   External Document

## 2010-02-11 NOTE — Assessment & Plan Note (Signed)
Summary: icd-sj/cp intermittent  Medications Added LASIX 20 MG  TABS (FUROSEMIDE) Take 2 tablet by mouth once a day METOPROLOL SUCCINATE 50 MG XR24H-TAB (METOPROLOL SUCCINATE) as directed        Referring Provider:  Darryll Capers, MD Primary Provider:  Darryll Capers, MD   History of Present Illness: Sandra [name] is seen in followup for congestive heart failure in the setting of hypertrophic cardiomyopathy with depressed left ventricular function. She is status post CRT-D  implantation. She is on Coumadin. Because of recurrences of atrial fibrillation for the fall and winter, she is also a put on dofetilide in February. She recently underwent repeat cardioversion because of recurrence of atrial fibrillation; her Tikosyn dose at that time was increased from 250-375 twice daily. She has been holding sinus rhythm.  Yesterday she had an episode of chest discomfort associated with palpitations. Her home device demonstrates irregular heartbeats and a heart rate of about 100. on arrival to the office today however her symptoms had abated.        Current Medications (verified): 1)  Lotensin 10 Mg  Tabs (Benazepril Hcl) .... Take 1 Tablet By Mouth Two Times A Day 2)  Lasix 20 Mg  Tabs (Furosemide) .... Take 2 Tablet By Mouth Once A Day 3)  Zetia 10 Mg  Tabs (Ezetimibe) .... Take 1 Tablet By Mouth Once A Day 4)  Dovonex   Soln (Calcipotriene Soln) .... As Directed 5)  Coumadin 5 Mg  Tabs (Warfarin Sodium) .... Alternate 5 Mg and 2.5 Every Other Day 6)  Coreg 12.5 Mg  Tabs (Carvedilol) .... Take One Tablet By Mouth Two Times A Day 7)  Proctosol Hc 2.5 %  Crea (Hydrocortisone) .... As Needed 8)  Tranxene-T 7.5 Mg  Tabs (Clorazepate Dipotassium) .... Once Daily As Needed 9)  Synthroid 100 Mcg  Tabs (Levothyroxine Sodium) .... Once Daily 10)  Aspirin 81 Mg Tbec (Aspirin) .... Take One Tablet By Mouth Every Other Day 11)  Vitamin E 400 Unit Caps (Vitamin E) .... Take One Capsule By Mouth Daily 12)   Tikosyn 250 Mcg Caps (Dofetilide) .Marland Kitchen.. 1 Two Times A Day 13)  Tikosyn 125 Mcg Caps (Dofetilide) .... Take 1 Capsule By Mouth Two Times A Day  Allergies: 1)  ! Novocain 2)  ! Niacin 3)  ! * Statins 4)  ! Altace 5)  ! Codeine  Vital Signs:  Patient profile:   77 year old female Height:      65 inches Weight:      150.75 pounds BMI:     25.18 BP sitting:   124 / 80  (left arm) Cuff size:   regular  Vitals Entered By: Scherrie Bateman, LPN (December 17, 2009 4:10 PM)  Physical Exam  General:  The patient was alert and oriented in no acute distress. HEENT Normal.  Neck veins were flat, carotids were brisk.  Lungs were clear.  Heart sounds were regular without murmurs or gallops.  Abdomen was soft with active bowel sounds. There is no clubbing cyanosis or edema. Skin Warm and dry     ICD Specifications Following MD:  Sherryl Manges, MD     ICD Vendor:  St Jude     ICD Model Number:  [id number]     ICD Serial Number:  [id number] ICD DOI:  11/12/2005     ICD Implanting MD:  Sherryl Manges, MD  Lead 1:    Location: RA     DOI: 11/12/2005     Model #: [id number]  Serial #: [id number]     Status: active Lead 2:    Location: RV     DOI: 11/12/2005     Model #: 7000     Serial #: [id number]     Status: active Lead 3:    Location: LV     DOI: 11/12/2005     Model #: 4538     Serial #: [id number]     Status: active  Indications::  HCM,CHF   ICD Follow Up Remote Check?  No Battery Voltage:  2.57 V     Charge Time:  12.4 seconds     Underlying rhythm:  SR ICD Dependent:  No       ICD Device Measurements Atrium:  Impedance: 450 ohms,  Right Ventricle:  Impedance: 375 ohms,  Left Ventricle:  Impedance: 700 ohms,   Episodes MS Episodes:  7     Percent Mode Switch:  3%     Coumadin:  Yes Atrial Pacing:  100%     Ventricular Pacing:  100%  Brady Parameters Mode DDDR     Lower Rate Limit:  70     Upper Rate Limit 115 PAV 200     Sensed AV Delay:  150  Tachy Zones VF:  231     VT:  200     VT1:  171      Tech Comments:  A-fib episodes yesterday, + coumadin.  Follow up as scheduled. Sandra Harm, LPN  December 17, 2009 4:11 PM   Impression & Recommendations:  Problem # 1:  ATRIAL FIBRILLATION (ICD-427.31) we spent more than an hour discussing atrial fibrillation treatment options device interrogations in genetic testing. 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  for right now we will continue her current dose of Tikosyn as well as giving her a dose of metoprolol succinate take as needed which he has a recurrent episode Her updated medication list for this problem includes:    Coumadin 5 Mg Tabs (Warfarin sodium) .Marland Kitchen... Alternate 5 mg and 2.5 every other day    Coreg 12.5 Mg Tabs (Carvedilol) .Marland Kitchen... Take one tablet by mouth two times a day    Aspirin 81 Mg Tbec (Aspirin) .Marland Kitchen... Take one tablet by mouth every other day    Tikosyn 250 Mcg Caps (Dofetilide) .Marland Kitchen... 1 two times a day    Tikosyn 125 Mcg Caps (Dofetilide) .Marland Kitchen... Take 1 capsule by mouth two times a day    Metoprolol Succinate 50 Mg Xr24h-tab (Metoprolol succinate) .Marland Kitchen... As directed  Problem # 2:  IMPLANTATION OF DEFIBRILLATOR, ST. JUDE ATLAS V343 (ICD-V45.02) Device parameters and data were reviewed and no changes were made  Patient Instructions: 1)  Your physician has recommended you make the following change in your medication: Metoprolol 50mg  as needed.  2)  Your physician wants you to follow-up in: May 2012  You will receive a reminder letter in the mail two months in advance. If you don't receive a letter, please call our office to schedule the follow-up appointment. Prescriptions: METOPROLOL SUCCINATE 50 MG XR24H-TAB (METOPROLOL SUCCINATE) as directed  #10 x 0   Entered by:   Claris Gladden RN   Authorized by:   Nathen May, MD, [facility]   Signed by:   Claris Gladden RN on 12/17/2009   Method used:   Electronically to        [facility]  [phone]* (retail)  [address]       [place], [place]  [address]       Ph: [phone] or [phone]       Fax: [phone]   RxID:   [id number]

## 2010-02-11 NOTE — Assessment & Plan Note (Signed)
Summary: pt/njr   Nurse Visit   Allergies: 1)  ! Novocain 2)  ! Niacin 3)  ! * Statins 4)  ! Altace 5)  ! Codeine Laboratory Results   Blood Tests   Date/Time Received: April 09, 2009 9:35 AM  Date/Time Reported: April 09, 2009 9:35 AM   PT: 19.2 s   (Normal Range: 10.6-13.4)  INR: 2.5   (Normal Range: 0.88-1.12   Therap INR: 2.0-3.5) Comments: Wynona Canes, CMA  April 09, 2009 9:35 AM     Orders Added: 1)  Est. Patient Level I [99211] 2)  Protime [16109UE]  Laboratory Results   Blood Tests     PT: 19.2 s   (Normal Range: 10.6-13.4)  INR: 2.5   (Normal Range: 0.88-1.12   Therap INR: 2.0-3.5) Comments: Wynona Canes, CMA  April 09, 2009 9:35 AM       ANTICOAGULATION RECORD PREVIOUS REGIMEN & LAB RESULTS Anticoagulation Diagnosis:  Atrial Fibrillation on  12/24/2008 Previous INR Goal Range:  2.0-3.0 on  08/24/2006 Previous INR:  2.1 on  03/12/2009 Previous Coumadin Dose(mg):  2.5mg ,5mg  alt. on  03/12/2009 Previous Regimen:  same dose on  03/12/2009 Previous Coagulation Comments:  OV on  09/01/2007  NEW REGIMEN & LAB RESULTS Current INR: 2.5 Regimen: same dose  (no change)       Repeat testing in: 4 weeks MEDICATIONS LOTENSIN 10 MG  TABS (BENAZEPRIL HCL) Take 1 tablet by mouth two times a day LASIX 20 MG  TABS (FUROSEMIDE) Take 1 tablet by mouth once a day ZETIA 10 MG  TABS (EZETIMIBE) Take 1 tablet by mouth once a day DOVONEX   SOLN (CALCIPOTRIENE SOLN) as directed COUMADIN 5 MG  TABS (WARFARIN SODIUM) as instructed COREG 12.5 MG  TABS (CARVEDILOL) take one tablet by mouth two times a day PROCTOSOL HC 2.5 %  CREA (HYDROCORTISONE) as needed TRANXENE-T 7.5 MG  TABS (CLORAZEPATE DIPOTASSIUM) once daily as needed SYNTHROID 100 MCG  TABS (LEVOTHYROXINE SODIUM) once daily [BMN] ASPIRIN 81 MG TBEC (ASPIRIN) Take one tablet by mouth every other day VITAMIN E 400 UNIT CAPS (VITAMIN E) take one capsule by mouth daily FISH OIL 1000 MG CAPS (OMEGA-3  FATTY ACIDS) take one capsule by mouth daily MULTIVITAMINS  TABS (MULTIPLE VITAMIN) take one daily TIKOSYN 250 MCG CAPS (DOFETILIDE) 1 two times a day   Anticoagulation Visit Questionnaire      Coumadin dose missed/changed:  No      Abnormal Bleeding Symptoms:  No   Any diet changes including alcohol intake, vegetables or greens since the last visit:  No Any illnesses or hospitalizations since the last visit:  No Any signs of clotting since the last visit (including chest discomfort, dizziness, shortness of breath, arm tingling, slurred speech, swelling or redness in leg):  No

## 2010-02-11 NOTE — Progress Notes (Signed)
Summary: pvc/ discuss with amber only   Phone Note Call from Patient Call back at Home Phone [phone]   [name]: Patient Reason for Call: Talk to Nurse Details for Reason: Per pt calling, upset about being in afib again, would like to talk to amber only.  Summary of Call: Spoke with patient.  She has not been feeling well since Thurs, thinks she may be back in afib.  Pt states PVC's have calmed down since Thursday.  Pt is s/p DCCV on 12-16.  Was in NSR with PVC's at last check.  D/W Dr Graciela Husbands, pt to come in this afternoon for EKG with triage nurse then discuss plan with Dr Graciela Husbands based on findings.  Spoke with Leotis Shames, she will do EKG for patient at [facility] and will talk with Dr Graciela Husbands after that has been done to discuss plan.  Pt has taken all of her medications this morning.  Feels SOB and tired, but no acute complaints now. Pt aware and agrees with plan. Gypsy Balsam RN BSN  March 04, 2009 9:38 AM  Initial call taken by: Lorne Skeens,  March 04, 2009 8:59 AM

## 2010-02-11 NOTE — Cardiovascular Report (Signed)
Summary: Office Visit   Office Visit   Imported By: Roderic Ovens 01/24/2009 12:35:37  _____________________________________________________________________  External Attachment:    Type:   Image     Comment:   External Document

## 2010-02-11 NOTE — Assessment & Plan Note (Signed)
Summary: defib [email or web address]  Medications Added LASIX 20 MG  TABS (FUROSEMIDE) Take 2 tablet by mouth once a day      Allergies Added:   Visit Type:  ICD St Jude Referring Provider:  Darryll Capers, MD Primary Provider:  Darryll Capers, MD  CC:  shortness of breath and .  History of Present Illness: Sandra [name] is seen in followup for congestive heart failure in the setting of hypertrophic cardiomyopathy with depressed left ventricular function. She is status post CRT-D  implantation. She is on Coumadin. Because of recurrences of atrial fibrillation for the fall and winter, she is also a put on dofetilide in February. She recently underwent repeat cardioversion because of recurrence of atrial fibrillation; her Tikosyn dose at that time was increased from 250-375 twice daily. She has been holding sinus rhythm.  About 4 weeks ago she began developing shortness of breath that has been progressive and associated with both exertion as well as recumbency. She is noted no change in her peripheral edema that may have been old and abdominal swelling there is no change in her appetite. There've been no significant change in her weight.  There has been a great deal of stress with her daughter and her husband          Problems Prior to Update: 1)  Cough  (ICD-786.2) 2)  Hypotension  (ICD-458.9) 3)  Premature Ventricular Contractions  (ICD-427.69) 4)  Cellulitis and Abscess of Unspecified Site  (ICD-682.9) 5)  Dog Bite  (ICD-E906.0) 6)  Systolic Heart Failure, Chronic  (ICD-428.22) 7)  Implantation of Defibrillator, St. 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 Take 1 Tablet By Mouth Two Times A Day 2)  Lasix 20 Mg  Tabs (Furosemide) .... Take 1 Tablet By Mouth Once A Day 3)  Zetia 10 Mg  Tabs (Ezetimibe) .... Take 1 Tablet By Mouth Once A Day 4)  Dovonex   Soln (Calcipotriene Soln) .... As Directed 5)  Coumadin 5 Mg  Tabs (Warfarin Sodium) .... Alternate 5 Mg and 2.5 Every Other Day 6)  Coreg 12.5 Mg  Tabs (Carvedilol) .... Take One Tablet By Mouth Two Times A Day 7)  Proctosol Hc 2.5 %  Crea (Hydrocortisone) .... As Needed 8)  Tranxene-T 7.5 Mg  Tabs (Clorazepate Dipotassium) .... Once Daily As Needed 9)  Synthroid 100 Mcg  Tabs (Levothyroxine Sodium) .... Once Daily 10)  Aspirin 81 Mg Tbec (Aspirin) .... Take One Tablet By Mouth Every Other Day 11)  Vitamin E 400 Unit Caps (Vitamin E) .... Take One Capsule By Mouth Daily 12)  Tikosyn 250 Mcg Caps (Dofetilide) .Marland Kitchen.. 1 Two Times A Day 13)  Tikosyn  125 Mcg Caps (Dofetilide) .... Take 1 Capsule By Mouth Two Times A Day  Allergies (verified): 1)  ! Novocain 2)  ! Niacin 3)  ! * Statins 4)  ! Altace 5)  ! Codeine  Past History:  Past Medical History: Last updated: 07/10/2009 Hyperlipidemia Atrial fibrillation Hypothyroidism Bradycardia Hypertrophic nonobstructive cardiomyopathy Anal Fissure Adenomatous Colon Polyps Diverticulosis Thyroid disease AICD- St. Jude Atlas v343 Systolic heart failure (EF  40-45%) Direct current cardioversion-2011  Past Surgical History: Last updated: 07/10/2009 Pacer/defibrilator 11/07 St Jude  Direct current cardioversion-2011  Family History: Last updated: 06/24/2009 Family History of Heart Disease: Mother and multiple family members on mother side.  Family History of Colon Cancer: Father had malignant polyp removed via colonoscopy in his 67's ("cancer cells in the polyp" per patient) Daughter with cardiomyopathy  Social History: Last updated: 09/10/2008 Retired Married Alcohol use-no Drug use-no Patient is a former smoker.  Patient does not get regular exercise.   Risk Factors: Exercise: no (09/10/2008)  Risk Factors: Smoking Status: quit (09/30/2009) Passive Smoke Exposure: no (09/30/2009)  Vital Signs:  Patient profile:   77 year old female Height:      65 inches Weight:      152 pounds BMI:     25.39 Pulse rate:   71 / minute BP sitting:   150 / 100  (right arm) Cuff size:   regular  Vitals Entered By: Caralee Ates CMA (November 12, 2009 12:02 PM)  Physical Exam  General:  The patient was alert and oriented in no acute distress. HEENT Normal.  Neck veins were 7-8 cm with positive HJR Lungs were clear.  Heart sounds were regular without murmurs or gallops.  Abdomen was soft with active bowel sounds. There is no clubbing cyanosis or edema. Skin Warm and dry    EKG  Procedure date:  11/12/2009  Findings:      AV pacing   ICD Specifications Following MD:  Sherryl Manges, MD     ICD Vendor:  [facility] Jude     ICD Model Number:  [phone]     ICD Serial Number:  [id number] ICD DOI:  11/12/2005     ICD Implanting MD:  Sherryl Manges, MD  Lead 1:    Location: RA     DOI: 11/12/2005     Model #: 1688TC     Serial #: [id number]     Status: active Lead 2:    Location: RV     DOI: 11/12/2005     Model #: 7000     Serial #: [id number]     Status: active Lead 3:    Location: LV     DOI: 11/12/2005     Model #: 4538     Serial #: [id number]     Status:  active  Indications::  HCM,CHF   ICD Follow Up Battery Voltage:  2.57 V     Charge Time:  12.4 seconds     Underlying rhythm:  SR ICD Dependent:  No       ICD Device Measurements Atrium:  Amplitude: 1.8 mV, Impedance: 425 ohms, Threshold: 1.0 V at 0.5 msec Right Ventricle:  Amplitude: 9.6 mV, Impedance: 365 ohms, Threshold: 0.75 V at 0.5 msec Shock Impedance: 34 ohms   Episodes MS Episodes:  2     Percent Mode Switch:  <1%     Coumadin:  Yes Shock:  0     ATP:  0     Nonsustained:  0  Atrial Therapies:  0 Atrial Pacing:  98%     Ventricular Pacing:  >99%  Brady Parameters Mode DDDR     Lower Rate Limit:  70     Upper Rate Limit 115 PAV 200     Sensed AV Delay:  150  Tachy Zones VF:  231     VT:  200     VT1:  171     Next Cardiology Appt Due:  03/13/2010 Tech Comments:  2 AMS EPISODES--BOTH LESS THAN 1 MINUTE.  NORMAL DEVICE FUNCTION.  NO CHANGES MADE. PT NOT FEELING WELL--FATIGUE AND SOB.  ROV IN 3 MTHS W/DEVICE CLINIC. Vella Kohler  November 12, 2009 12:15 PM  Impression & Recommendations:  Problem # 1:  SYSTOLIC HEART FAILURE, ACUTE ON CHRONIC (ICD-428.23) the patient appears to have aggravation of her systolic heart failure. He has jugular venous distention and her symptoms are certainly consistent with this.  We'll empirically increase her Lasix. Will check her metabolic profile and her BNP. Will also need to reevaluate her left ventricular function. Pursuit of AV optimizationby  echo may also be appropriate.  She has had multiple negative catheterizations in the past so I'm not eager to pursue this again. Myoview scanning may be necessary however given the association of coronary disease with progression congestive heart failure Her updated medication list for this problem includes:    Lotensin 10 Mg Tabs (Benazepril hcl) .Marland Kitchen... Take 1 tablet by mouth two times a day    Lasix 20 Mg Tabs (Furosemide) .Marland Kitchen... Take 1 tablet by mouth once a day    Coumadin 5 Mg Tabs  (Warfarin sodium) .Marland Kitchen... Alternate 5 mg and 2.5 every other day    Coreg 12.5 Mg Tabs (Carvedilol) .Marland Kitchen... Take one tablet by mouth two times a day    Aspirin 81 Mg Tbec (Aspirin) .Marland Kitchen... Take one tablet by mouth every other day    Tikosyn 250 Mcg Caps (Dofetilide) .Marland Kitchen... 1 two times a day    Tikosyn 125 Mcg Caps (Dofetilide) .Marland Kitchen... Take 1 capsule by mouth two times a day  Orders: Echocardiogram (Echo)  Problem # 2:  IMPLANTATION OF DEFIBRILLATOR, ST. JUDE ATLAS V343 (ICD-V45.02) Device parameters and data were reviewed and no changes were made  Problem # 3:  HYPERTROPHIC OBSTRUCTIVE CARDIOMYOPATHY (ICD-425.1)  aabove; we also had the beginning discussions of trying to pursue gene testing for the "[place]" kindred as there is then another death in a cousin. Her updated medication list for this problem includes:    Lotensin 10 Mg Tabs (Benazepril hcl) .Marland Kitchen... Take 1 tablet by mouth two times a day    Lasix 20 Mg Tabs (Furosemide) .Marland Kitchen... Take 2 tablet by mouth once a day    Coumadin 5 Mg Tabs (Warfarin sodium) .Marland Kitchen... Alternate 5 mg and 2.5 every other day    Coreg 12.5 Mg Tabs (Carvedilol) .Marland Kitchen... Take one tablet by mouth two times a day    Aspirin 81 Mg Tbec (Aspirin) .Marland Kitchen... Take one tablet by mouth every other day    Tikosyn 250 Mcg Caps (Dofetilide) .Marland Kitchen... 1 two times a day    Tikosyn 125 Mcg Caps (Dofetilide) .Marland Kitchen... Take 1 capsule by mouth two times a day  Her updated medication list for this problem includes:    Lotensin 10 Mg Tabs (Benazepril hcl) .Marland Kitchen... Take 1 tablet by mouth two times a day    Lasix 20 Mg Tabs (Furosemide) .Marland Kitchen... Take 2 tablet by mouth once a day    Coumadin 5  Mg Tabs (Warfarin sodium) .Marland Kitchen... Alternate 5 mg and 2.5 every other day    Coreg 12.5 Mg Tabs (Carvedilol) .Marland Kitchen... Take one tablet by mouth two times a day    Aspirin 81 Mg Tbec (Aspirin) .Marland Kitchen... Take one tablet by mouth every other day    Tikosyn 250 Mcg Caps (Dofetilide) .Marland Kitchen... 1 two times a day    Tikosyn 125 Mcg Caps  (Dofetilide) .Marland Kitchen... Take 1 capsule by mouth two times a day  Problem # 4:  ATRIAL FIBRILLATION (ICD-427.31)  nintercurrent atrial fibrillation she continues on Tikosyn we will check her potassium and magnesium today. Her updated medication list for this problem includes:    Coumadin 5 Mg Tabs (Warfarin sodium) .Marland Kitchen... Alternate 5 mg and 2.5 every other day    Coreg 12.5 Mg Tabs (Carvedilol) .Marland Kitchen... Take one tablet by mouth two times a day    Aspirin 81 Mg Tbec (Aspirin) .Marland Kitchen... Take one tablet by mouth every other day    Tikosyn 250 Mcg Caps (Dofetilide) .Marland Kitchen... 1 two times a day    Tikosyn 125 Mcg Caps (Dofetilide) .Marland Kitchen... Take 1 capsule by mouth two times a day  Orders: Echocardiogram (Echo) TLB-Magnesium (Mg) (83735-MG) TLB-BNP (B-Natriuretic Peptide) (83880-BNPR) TLB-BMP (Basic Metabolic Panel-BMET) (80048-METABOL)  Her updated medication list for this problem includes:    Coumadin 5 Mg Tabs (Warfarin sodium) .Marland Kitchen... Alternate 5 mg and 2.5 every other day    Coreg 12.5 Mg Tabs (Carvedilol) .Marland Kitchen... Take one tablet by mouth two times a day    Aspirin 81 Mg Tbec (Aspirin) .Marland Kitchen... Take one tablet by mouth every other day    Tikosyn 250 Mcg Caps (Dofetilide) .Marland Kitchen... 1 two times a day    Tikosyn 125 Mcg Caps (Dofetilide) .Marland Kitchen... Take 1 capsule by mouth two times a day  Patient Instructions: 1)  Your physician has recommended you make the following change in your medication: Increase Lasix to 2 pills a day.  2)  Your physician has requested that you have an echocardiogram.  Echocardiography is a painless test that uses sound waves to create images of your heart. It provides your doctor with information about the size and shape of your heart and how well your heart's chambers and valves are working.  This procedure takes approximately one hour. There are no restrictions for this procedure. 3)  Your physician recommends that you have the following labs: BMET/BNP/Mag. 4)  Your physician recommends that you  schedule a follow-up appointment in: 3 months with Pacer clinic

## 2010-02-11 NOTE — Progress Notes (Signed)
Summary: Tikosyn override   Phone Note Call from Patient Call back at Home Phone [phone]   [name]: Patient Summary of Call: Patient called, stated that Tikosyn was third teir on her insurace.  They required teir override to be initiated by the physician office.  Called her insurance at [phone] to discuss. Gave information requested.  They stated it would take 24-72 hours to review and we would be notified by fax of results.  Pt will call back Tuesday morning to check on status.  Pt was discharged with 1 week's worth of Tikosyn from hospital. Gypsy Balsam RN BSN  March 08, 2009 4:50 PM

## 2010-02-11 NOTE — Procedures (Signed)
Summary: Cardiology Device Clinic    Allergies: 1)  ! Novocain 2)  ! Niacin 3)  ! * Statins 4)  ! Altace 5)  ! Codeine   ICD Specifications Following MD:  Sherryl Manges, MD     ICD Vendor:  [facility] Jude     ICD Model Number:  [phone]     ICD Serial Number:  [id number] ICD DOI:  11/12/2005     ICD Implanting MD:  Sherryl Manges, MD  Lead 1:    Location: RA     DOI: 11/12/2005     Model #: 1688TC     Serial #: [id number]     Status: active Lead 2:    Location: RV     DOI: 11/12/2005     Model #: 7000     Serial #: [id number]     Status: active Lead 3:    Location: LV     DOI: 11/12/2005     Model #: 4538     Serial #: [id number]     Status: active  Indications::  HCM,CHF   ICD Follow Up Battery Voltage:  2.59 V     Charge Time:  12.1 seconds     ICD Dependent:  No       ICD Device Measurements Atrium:  Amplitude: 1.3 mV, Impedance: 415 ohms, Threshold: 0.75 V at 0.5 msec Right Ventricle:  Amplitude: 9.5 mV, Impedance: 370 ohms, Threshold: 1.0 V at 0.5 msec Left Ventricle:  Impedance: 630 ohms, Threshold: 0.75 V at 0.5 msec  Episodes MS Episodes:  1     Percent Mode Switch:  ongoing     Coumadin:  Yes Shock:  0     ATP:  0     Nonsustained:  0     Atrial Therapies:  0 Atrial Pacing:  97%     Ventricular Pacing:  98%  Brady Parameters Mode DDDR     Lower Rate Limit:  70     Upper Rate Limit 115 PAV 200     Sensed AV Delay:  150  Tachy Zones VF:  231     VT:  200     VT1:  171     Tech Comments:  Cardioverson on 06-25-09. Checked by [employer].  Normal device function.  No changes made. Vella Kohler  June 26, 2009 11:16 AM

## 2010-02-11 NOTE — Procedures (Signed)
Summary: device/saf      Allergies Added:   Current Medications (verified): 1)  Lotensin 10 Mg  Tabs (Benazepril Hcl) .... Take 1 Tablet By Mouth Two Times A Day 2)  Lasix 20 Mg  Tabs (Furosemide) .... Take 1 Tablet By Mouth Once A Day 3)  Zetia 10 Mg  Tabs (Ezetimibe) .... Take 1 Tablet By Mouth Once A Day 4)  Dovonex   Soln (Calcipotriene Soln) .... As Directed 5)  Coumadin 5 Mg  Tabs (Warfarin Sodium) .... Alternate 5 Mg and 2.5 Every Other Day 6)  Coreg 12.5 Mg  Tabs (Carvedilol) .... Take One Tablet By Mouth Two Times A Day 7)  Proctosol Hc 2.5 %  Crea (Hydrocortisone) .... As Needed 8)  Tranxene-T 7.5 Mg  Tabs (Clorazepate Dipotassium) .... Once Daily As Needed 9)  Synthroid 100 Mcg  Tabs (Levothyroxine Sodium) .... Once Daily 10)  Aspirin 81 Mg Tbec (Aspirin) .... Take One Tablet By Mouth Every Other Day 11)  Vitamin E 400 Unit Caps (Vitamin E) .... Take One Capsule By Mouth Daily 12)  Fish Oil 1000 Mg Caps (Omega-3 Fatty Acids) .... Take One Capsule By Mouth Daily 13)  Tikosyn 250 Mcg Caps (Dofetilide) .Marland Kitchen.. 1 Two Times A Day 14)  Tikosyn 125 Mcg Caps (Dofetilide) .... Take 1 Capsule By Mouth Two Times A Day  Allergies (verified): 1)  ! Novocain 2)  ! Niacin 3)  ! * Statins 4)  ! Altace 5)  ! Codeine   ICD Specifications Following MD:  Sherryl Manges, MD     ICD Vendor:  [facility] Jude     ICD Model Number:  [phone]     ICD Serial Number:  [id number] ICD DOI:  11/12/2005     ICD Implanting MD:  Sherryl Manges, MD  Lead 1:    Location: RA     DOI: 11/12/2005     Model #: 1688TC     Serial #: [id number]     Status: active Lead 2:    Location: RV     DOI: 11/12/2005     Model #: 7000     Serial #: [id number]     Status: active Lead 3:    Location: LV     DOI: 11/12/2005     Model #: 4538     Serial #: [id number]     Status: active  Indications::  HCM,CHF   ICD Follow Up Battery Voltage:  2.59 V     Charge Time:  12.4 seconds     Underlying rhythm:  sinus brady ICD Dependent:  No       ICD  Device Measurements Atrium:  Amplitude: 2.0 mV, Impedance: 425 ohms, Threshold: 0.5 V at 0.5 msec Right Ventricle:  Amplitude: 9.9 mV, Impedance: 365 ohms, Threshold: 0.75 V at 0.5 msec Left Ventricle:  Impedance: 670 ohms, Threshold: 0.5 V at 0.5 msec  Episodes MS Episodes:  2     Percent Mode Switch:  <1%     Coumadin:  Yes Shock:  0     ATP:  0     Nonsustained:  0     Atrial Therapies:  0 Atrial Pacing:  99%     Ventricular Pacing:  99%  Brady Parameters Mode DDDR     Lower Rate Limit:  70     Upper Rate Limit 115 PAV 200     Sensed AV Delay:  150  Tachy Zones VF:  231     VT:  200  VT1:  171     Next Cardiology Appt Due:  11/12/2009 Tech Comments:  NORMAL DEVICE FUNCTION. NO EPISODES SINCE LAST CHECK.  NO CHANGES MADE. ROV IN [place] W/SK.  Vella Kohler  October 17, 2009 1:52 PM

## 2010-02-11 NOTE — Procedures (Signed)
Summary: HOLD letter/[facility]  HOLD letter/[facility]   Imported By: Lester Richfield 02/04/2009 13:29:51  _____________________________________________________________________  External Attachment:    Type:   Image     Comment:   External Document

## 2010-02-11 NOTE — Progress Notes (Signed)
Summary: blood pressure   Phone Note Call from Patient Call back at [facility] Phone [phone]   [name]: Patient Reason for Call: Talk to Nurse Summary of Call: this morning blood pressure 154/90. no chest pain no sob.  Initial call taken by: Roe Coombs,  December 17, 2009 8:54 AM  Follow-up for Phone Call        spoke w/pt and she stated yesterday around 1230pm she felt something was wrong with her heart, she hurt in the center of her chest but denied shob and palpitations. this pain lasted about 5 minutes and she states in went to her throat. the pt felt that she was going out of rhythm.  she took her bp and says the systolic was high (# unknown)/100 and hr-90. today her bp is 154/90 and pulse 100. denies feeling bad but states doesn't feel good and feels certain that something is going on with her heart. pt c/o sharp pain over l. eye which she relates to her bp. pt will come in to see Dr. Graciela Husbands this afternoon.  Follow-up by: Claris Gladden RN,  December 17, 2009 9:17 AM

## 2010-02-11 NOTE — Assessment & Plan Note (Signed)
Summary: EKG    Vital Signs:  Patient profile:   77 year old female Weight:      150 pounds Pulse rate:   81 / minute BP sitting:   120 / 80  (left arm) Cuff size:   regular  Vitals Entered By: Julieta Gutting, RN, BSN (March 04, 2009 3:34 PM)  Current Medications (verified): 1)  Lotensin 10 Mg  Tabs (Benazepril Hcl) .... Take 1 Tablet By Mouth Two Times A Day 2)  Lasix 20 Mg  Tabs (Furosemide) .... Take 1 Tablet By Mouth Once A Day 3)  Zetia 10 Mg  Tabs (Ezetimibe) .... Take 1 Tablet By Mouth Once A Day 4)  Dovonex   Soln (Calcipotriene Soln) .... As Directed 5)  Coumadin 5 Mg  Tabs (Warfarin Sodium) .... As Instructed 6)  Coreg 12.5 Mg  Tabs (Carvedilol) .... Take One Tablet By Mouth Two Times A Day 7)  Proctosol Hc 2.5 %  Crea (Hydrocortisone) .... As Needed 8)  Tranxene-T 7.5 Mg  Tabs (Clorazepate Dipotassium) .... Once Daily As Needed 9)  Synthroid 100 Mcg  Tabs (Levothyroxine Sodium) .... Once Daily 10)  Aspirin 81 Mg Tbec (Aspirin) .... Take One Tablet By Mouth Every Other Day 11)  Vitamin E 400 Unit Caps (Vitamin E) .... Take One Capsule By Mouth Daily 12)  Fish Oil 1000 Mg Caps (Omega-3 Fatty Acids) .... Take One Capsule By Mouth Daily 13)  Multivitamins  Tabs (Multiple Vitamin) .... Take One Daily  Allergies: 1)  ! Novocain 2)  ! Niacin 3)  ! * Statins 4)  ! Altace 5)  ! Codeine  Patient Instructions: 1)  Your physician recommends that you have lab work today: BMP and Magnesium 2)  You will receive a phone call from [facility] on 03/05/09 about admission to the hospital. 3)  Your physician recommends that you continue on your current medications as directed. Please refer to the Current Medication list given to you today.   Referring Provider:  Darryll Capers, MD Primary Provider:  Darryll Capers, MD  CC:  PVCs or AFIB and SOB and left shoulder pain.  History of Present Illness: Mrs. Behrle is seen today because she is short of breath. She thinks her heart  is going out of rhythm and did so last Thursday.  History history of recurrent paroxysms of atrial fibrillation the setting of hypertrophic retinopathy depressed left ventricular function and previously implanted CRT-D. She takes Coumadin but is not currently on an antiarrhythmic   Physical Exam  General:  The patient was alert and oriented in mildo acute distress look extremely tired .Neck veins were flat, carotids were brisk. Lungs were clear. Heart sounds were irregular without murmurs or gallops. Abdomen was soft with active bowel sounds. There is no clubbing cyanosis. 1+ edema    Impression & Recommendations:  Problem # 1:  ATRIAL FIBRILLATION (ICD-427.31) The patient has recurrent atrial fibrillation. She will be admitted to hospital tomorrow for Tikosyn therapy. We'll undertake cardioversion after 36 hours in the event that she does not convert on her on.we will check a potassium level and magnesium level today in anticipation of Tikosyn initiation.  Electrocardiogram was reviewed today and demonstrated a QT interval of 0.49 with a QRS duration of 0.16 this is sufficiently short in the setting of a defibrillator to allow Tikosyn. Her updated medication list for this problem includes:    Coumadin 5 Mg Tabs (Warfarin sodium) .Marland Kitchen... As instructed    Coreg 12.5 Mg Tabs (Carvedilol) .Marland KitchenMarland KitchenMarland KitchenMarland Kitchen  Take one tablet by mouth two times a day    Aspirin 81 Mg Tbec (Aspirin) .Marland Kitchen... Take one tablet by mouth every other day  Orders: TLB-BMP (Basic Metabolic Panel-BMET) (80048-METABOL) TLB-Magnesium (Mg) (83735-MG)  Other Orders: EKG w/ Interpretation (93000)   Orders Added: 1)  EKG w/ Interpretation [93000] 2)  TLB-BMP (Basic Metabolic Panel-BMET) [80048-METABOL] 3)  TLB-Magnesium (Mg) [83735-MG]

## 2010-02-11 NOTE — Progress Notes (Signed)
Summary: pt SOB and not feeling well   Phone Note Call from Patient Call back at Home Phone [phone]   [name]: Patient Reason for Call: Talk to Nurse, Talk to Doctor Summary of Call: pt had cardioversion done Tuesday and she is SOB and she has diarrhea and she is not feeling well at all and she wants to talk to someone and make sure this is not from the change in her medications Initial call taken by: Omer Jack,  June 28, 2009 9:12 AM  Follow-up for Phone Call        06/28/09--0930am--pt calling stating having severe diarrhea and some SOB--was wondering if caused by increase in dose of tikosyn--increased to 375 micrograms on 6/14--has also just finished zithromycin (6/10--last dose) for c/o cold--advised we would f/u this pm when dr Graciela Husbands in office--pt agrees--nt Follow-up by: Ledon Snare, RN,  June 28, 2009 9:36 AM  Additional Follow-up for Phone Call Additional follow up Details #1::        Per Dr Graciela Husbands, Tikosyn unlikely to cause symptoms.  Pt needs appt Mon for device interrogation to verify SR. Gypsy Balsam RN BSN  June 28, 2009 6:32 PM  Pt reports feeling some better since this weekend.  Will continue to monitor.  Seeing Dr Lovell Sheehan today, pt will request EKG at that appt. Gypsy Balsam RN BSN  July 01, 2009 8:32 AM

## 2010-02-11 NOTE — Assessment & Plan Note (Signed)
Summary: 3 month follow up/cjr   Vital Signs:  Patient profile:   77 year old female Height:      65 inches Weight:      148 pounds BMI:     24.72 Temp:     98.2 degrees F oral Pulse rate:   76 / minute Resp:     14 per minute BP sitting:   124 / 78  (left arm)  Vitals Entered By: Willy Eddy, LPN (March 13, 5282 11:57 AM) CC: roa-just discharged from hosptial- new med tikosyn   Primary Care Provider:  Darryll Capers, MD  CC:  roa-just discharged from hosptial- new med tikosyn.  History of Present Illness: The pt has been placed on tikosyn by cardiology for recurrent AF and has not been able to get an over ride yet... cardiology is working on this she has authorized the pharmacy to order so that she has it... has been in  and out of AF ad this is anoter chemical attempt to control rate. Breathing has ben stable except when in AF no current symptoms of CHF  Preventive Screening-Counseling & Management  Alcohol-Tobacco     Smoking Status: quit     Passive Smoke Exposure: no  Problems Prior to Update: 1)  Cellulitis and Abscess of Unspecified Site  (ICD-682.9) 2)  Dog Bite  (ICD-E906.0) 3)  Systolic Heart Failure, Chronic  (ICD-428.22) 4)  Implantation of Defibrillator, [employer] Jude Atlas V343  (ICD-V45.02) 5)  Atrial Fibrillation  (ICD-427.31) 6)  Hypertrophic Obstructive Cardiomyopathy  (ICD-425.1) 7)  Cough Variant Asthma  (ICD-493.82) 8)  Obst Chronic Bronchitis W/acute Bronchitis  (ICD-491.22) 9)  Cough  (ICD-786.2) 10)  Coumadin Therapy  (ICD-V58.61) 11)  Personal History of Colonic Polyps  (ICD-V12.72) 12)  Unspecified Labyrinthine Dysfunction  (ICD-386.50) 13)  Lumbosacral Strain  (ICD-846.0) 14)  Diverticulitis, Acute  (ICD-562.11) 15)  Contusion of Upper Arm  (ICD-923.03) 16)  Loc Osteoarthros Not Spec Prim/sec Lower Leg  (ICD-715.36) 17)  Contusion of Hip  (ICD-924.01) 18)  Uns Advrs Eff Uns Rx Medicinal&biological Sbstnc  (ICD-995.20) 19)  Aftercare,  Long-term Use, Anticoagulants  (ICD-V58.61) 20)  Encounter For Therapeutic Drug Monitoring  (ICD-V58.83) 21)  Hyperlipidemia  (ICD-272.4) 22)  Hypothyroidism  (ICD-244.9)  Medications Prior to Update: 1)  Lotensin 10 Mg  Tabs (Benazepril Hcl) .... Take 1 Tablet By Mouth Two Times A Day 2)  Lasix 20 Mg  Tabs (Furosemide) .... Take 1 Tablet By Mouth Once A Day 3)  Zetia 10 Mg  Tabs (Ezetimibe) .... Take 1 Tablet By Mouth Once A Day 4)  Dovonex   Soln (Calcipotriene Soln) .... As Directed 5)  Coumadin 5 Mg  Tabs (Warfarin Sodium) .... As Instructed 6)  Coreg 12.5 Mg  Tabs (Carvedilol) .... Take One Tablet By Mouth Two Times A Day 7)  Proctosol Hc 2.5 %  Crea (Hydrocortisone) .... As Needed 8)  Tranxene-T 7.5 Mg  Tabs (Clorazepate Dipotassium) .... Once Daily As Needed 9)  Synthroid 100 Mcg  Tabs (Levothyroxine Sodium) .... Once Daily 10)  Aspirin 81 Mg Tbec (Aspirin) .... Take One Tablet By Mouth Every Other Day 11)  Vitamin E 400 Unit Caps (Vitamin E) .... Take One Capsule By Mouth Daily 12)  Fish Oil 1000 Mg Caps (Omega-3 Fatty Acids) .... Take One Capsule By Mouth Daily 13)  Multivitamins  Tabs (Multiple Vitamin) .... Take One Daily  Current Medications (verified): 1)  Lotensin 10 Mg  Tabs (Benazepril Hcl) .... Take 1 Tablet By  Mouth Two Times A Day 2)  Lasix 20 Mg  Tabs (Furosemide) .... Take 1 Tablet By Mouth Once A Day 3)  Zetia 10 Mg  Tabs (Ezetimibe) .... Take 1 Tablet By Mouth Once A Day 4)  Dovonex   Soln (Calcipotriene Soln) .... As Directed 5)  Coumadin 5 Mg  Tabs (Warfarin Sodium) .... As Instructed 6)  Coreg 12.5 Mg  Tabs (Carvedilol) .... Take One Tablet By Mouth Two Times A Day 7)  Proctosol Hc 2.5 %  Crea (Hydrocortisone) .... As Needed 8)  Tranxene-T 7.5 Mg  Tabs (Clorazepate Dipotassium) .... Once Daily As Needed 9)  Synthroid 100 Mcg  Tabs (Levothyroxine Sodium) .... Once Daily 10)  Aspirin 81 Mg Tbec (Aspirin) .... Take One Tablet By Mouth Every Other Day 11)  Vitamin  E 400 Unit Caps (Vitamin E) .... Take One Capsule By Mouth Daily 12)  Fish Oil 1000 Mg Caps (Omega-3 Fatty Acids) .... Take One Capsule By Mouth Daily 13)  Multivitamins  Tabs (Multiple Vitamin) .... Take One Daily 14)  Tikosyn 250 Mcg Caps (Dofetilide) .Marland Kitchen.. 1 Two Times A Day  Allergies (verified): 1)  ! Novocain 2)  ! Niacin 3)  ! * Statins 4)  ! Altace 5)  ! Codeine  Past History:  Family History: Last updated: 02/12/2009 Family History of Heart Disease: Mother and multiple family members on mother side.  Family History of Colon Cancer: Father had malignant polyp removed via colonoscopy in his 86's ("cancer cells in the polyp" per patient)  Social History: Last updated: 09/10/2008 Retired Married Alcohol use-no Drug use-no Patient is a former smoker.  Patient does not get regular exercise.   Risk Factors: Exercise: no (09/10/2008)  Risk Factors: Smoking Status: quit (03/12/2009) Passive Smoke Exposure: no (03/12/2009)  Past medical, surgical, family and social histories (including risk factors) reviewed, and no changes noted (except as noted below).  Past Medical History: Reviewed history from 11/19/2008 and no changes required. Hyperlipidemia Atrial fibrillation Hypothyroidism Bradycardia HYpertrophic nonobstructive cardiomyopathy Anal Fissure Adenomatous Colon Polyps Diverticulosis Thyroid disease AICD- [employer] Jude Atlas [phone]  Past Surgical History: Reviewed history from 11/19/2008 and no changes required. pacer/defibrilator 11/07 Permanent pacemaker-St Jude  Family History: Reviewed history from 02/12/2009 and no changes required. Family History of Heart Disease: Mother and multiple family members on mother side.  Family History of Colon Cancer: Father had malignant polyp removed via colonoscopy in his 57's ("cancer cells in the polyp" per patient)  Social History: Reviewed history from 09/10/2008 and no changes required. Retired Married Alcohol  use-no Drug use-no Patient is a former smoker.  Patient does not get regular exercise.   Review of Systems  The patient denies anorexia, fever, weight loss, weight gain, vision loss, decreased hearing, hoarseness, chest pain, syncope, dyspnea on exertion, peripheral edema, prolonged cough, headaches, hemoptysis, abdominal pain, melena, hematochezia, severe indigestion/heartburn, hematuria, incontinence, genital sores, muscle weakness, suspicious skin lesions, transient blindness, difficulty walking, depression, unusual weight change, abnormal bleeding, enlarged lymph nodes, angioedema, and breast masses.    Physical Exam  General:  Well-developed,well-nourished,in no acute distress; alert,appropriate and cooperative throughout examination Head:  normocephalic and no abnormalities observed.   Eyes:  pupils equal and pupils reactive to light.   Ears:  R ear normal and L ear normal.   Neck:  supple and full ROM.   Lungs:  normal respiratory effort, no intercostal retractions, and no wheezes.   Heart:  normal rate and regular rhythm.   Abdomen:  Bowel sounds positive,abdomen soft and  non-tender without masses, organomegaly or hernias noted. Msk:  No deformity or scoliosis noted of thoracic or lumbar spine.   Extremities:  No clubbing, cyanosis, edema, or deformity noted with normal full range of motion of all joints.   Neurologic:  No cranial nerve deficits noted. Station and gait are normal. Plantar reflexes are down-going bilaterally. DTRs are symmetrical throughout. Sensory, motor and coordinative functions appear intact.   Impression & Recommendations:  Problem # 1:  ATRIAL FIBRILLATION (ICD-427.31) Assessment Improved withj the tikosyn Her updated medication list for this problem includes:    Coumadin 5 Mg Tabs (Warfarin sodium) .Marland Kitchen... As instructed    Coreg 12.5 Mg Tabs (Carvedilol) .Marland Kitchen... Take one tablet by mouth two times a day    Aspirin 81 Mg Tbec (Aspirin) .Marland Kitchen... Take one tablet  by mouth every other day    Tikosyn 250 Mcg Caps (Dofetilide) .Marland Kitchen... 1 two times a day  Orders: Protime (16109UE)  Problem # 2:  HYPERTROPHIC OBSTRUCTIVE CARDIOMYOPATHY (ICD-425.1)  Problem # 3:  HYPOTHYROIDISM (ICD-244.9)  Her updated medication list for this problem includes:    Synthroid 100 Mcg Tabs (Levothyroxine sodium) ..... Once daily  Labs Reviewed: TSH: 0.17 (09/04/2008)    Chol: 240 (09/04/2008)   HDL: 43.90 (09/04/2008)   LDL: DEL (07/04/2007)   TG: 130 (07/04/2007)  Problem # 4:  HYPERLIPIDEMIA (ICD-272.4) Assessment: Unchanged  Her updated medication list for this problem includes:    Zetia 10 Mg Tabs (Ezetimibe) .Marland Kitchen... Take 1 tablet by mouth once a day  Labs Reviewed: SGOT: 26 (07/04/2007)   SGPT: 22 (07/04/2007)  Lipid Goals: Chol Goal: 200 (05/10/2007)   HDL Goal: 40 (05/10/2007)   LDL Goal: 130 (05/10/2007)   TG Goal: 150 (05/10/2007)  Prior 10 Yr Risk Heart Disease: Not enough information (05/10/2007)   HDL:43.90 (09/04/2008), 42.30 (05/31/2008)  LDL:DEL (07/04/2007), DEL (04/26/2007)  Chol:240 (09/04/2008), 262 (05/31/2008)  Trig:130 (07/04/2007), 122 (04/26/2007)  Complete Medication List: 1)  Lotensin 10 Mg Tabs (Benazepril hcl) .... Take 1 tablet by mouth two times a day 2)  Lasix 20 Mg Tabs (Furosemide) .... Take 1 tablet by mouth once a day 3)  Zetia 10 Mg Tabs (Ezetimibe) .... Take 1 tablet by mouth once a day 4)  Dovonex Soln (Calcipotriene soln) .... As directed 5)  Coumadin 5 Mg Tabs (Warfarin sodium) .... As instructed 6)  Coreg 12.5 Mg Tabs (Carvedilol) .... Take one tablet by mouth two times a day 7)  Proctosol Hc 2.5 % Crea (Hydrocortisone) .... As needed 8)  Tranxene-t 7.5 Mg Tabs (Clorazepate dipotassium) .... Once daily as needed 9)  Synthroid 100 Mcg Tabs (Levothyroxine sodium) .... Once daily 10)  Aspirin 81 Mg Tbec (Aspirin) .... Take one tablet by mouth every other day 11)  Vitamin E 400 Unit Caps (Vitamin e) .... Take one capsule by  mouth daily 12)  Fish Oil 1000 Mg Caps (Omega-3 fatty acids) .... Take one capsule by mouth daily 13)  Multivitamins Tabs (Multiple vitamin) .... Take one daily 14)  Tikosyn 250 Mcg Caps (Dofetilide) .Marland Kitchen.. 1 two times a day  Patient Instructions: 1)  Please schedule a follow-up appointment in 3 months. 2)  protime in 30 days  Laboratory Results   Blood Tests   Date/Time Recieved: March 12, 2009 11:22 AM  Date/Time Reported: March 12, 2009 11:22 AM   PT: 17.8 s   (Normal Range: 10.6-13.4)  INR: 2.1   (Normal Range: 0.88-1.12   Therap INR: 2.0-3.5) Comments: Wynona Canes, CMA  March 12, 2009 11:22 AM       ANTICOAGULATION RECORD PREVIOUS REGIMEN & LAB RESULTS Anticoagulation Diagnosis:  Atrial Fibrillation on  12/24/2008 Previous INR Goal Range:  2.0-3.0 on  08/24/2006 Previous INR:  2.7 ratio on  12/24/2008 Previous Coumadin Dose(mg):  5mg  on  12/24/2008 Previous Regimen:  2.5mg  and 5mg . alt on  01/09/2009 Previous Coagulation Comments:  OV on  09/01/2007  NEW REGIMEN & LAB RESULTS Current INR: 2.1 Current Coumadin Dose(mg): 2.5mg ,5mg  alt. Regimen: same dose  MEDICATIONS LOTENSIN 10 MG  TABS (BENAZEPRIL HCL) Take 1 tablet by mouth two times a day LASIX 20 MG  TABS (FUROSEMIDE) Take 1 tablet by mouth once a day ZETIA 10 MG  TABS (EZETIMIBE) Take 1 tablet by mouth once a day DOVONEX   SOLN (CALCIPOTRIENE SOLN) as directed COUMADIN 5 MG  TABS (WARFARIN SODIUM) as instructed COREG 12.5 MG  TABS (CARVEDILOL) take one tablet by mouth two times a day PROCTOSOL HC 2.5 %  CREA (HYDROCORTISONE) as needed TRANXENE-T 7.5 MG  TABS (CLORAZEPATE DIPOTASSIUM) once daily as needed SYNTHROID 100 MCG  TABS (LEVOTHYROXINE SODIUM) once daily [BMN] ASPIRIN 81 MG TBEC (ASPIRIN) Take one tablet by mouth every other day VITAMIN E 400 UNIT CAPS (VITAMIN E) take one capsule by mouth daily FISH OIL 1000 MG CAPS (OMEGA-3 FATTY ACIDS) take one capsule by mouth daily MULTIVITAMINS  TABS  (MULTIPLE VITAMIN) take one daily TIKOSYN 250 MCG CAPS (DOFETILIDE) 1 two times a day   Anticoagulation Visit Questionnaire      Coumadin dose missed/changed:  No      Abnormal Bleeding Symptoms:  No   Any diet changes including alcohol intake, vegetables or greens since the last visit:  No Any illnesses or hospitalizations since the last visit:  Yes      Recent Illness/Hospitalizations:  Atrial Fibrallation Any signs of clotting since the last visit (including chest discomfort, dizziness, shortness of breath, arm tingling, slurred speech, swelling or redness in leg):  No

## 2010-02-11 NOTE — Procedures (Signed)
Summary: pc2      Allergies Added:   Current Medications (verified): 1)  Lotensin 10 Mg  Tabs (Benazepril Hcl) .... Take 1 Tablet By Mouth Two Times A Day 2)  Lasix 20 Mg  Tabs (Furosemide) .... Take 1 Tablet By Mouth Once A Day 3)  Zetia 10 Mg  Tabs (Ezetimibe) .... Take 1 Tablet By Mouth Once A Day 4)  Dovonex   Soln (Calcipotriene Soln) .... As Directed 5)  Coumadin 5 Mg  Tabs (Warfarin Sodium) .... Alternate 5 Mg and 2.5 Every Other Day 6)  Coreg 12.5 Mg  Tabs (Carvedilol) .... Take One Tablet By Mouth Two Times A Day 7)  Proctosol Hc 2.5 %  Crea (Hydrocortisone) .... As Needed 8)  Tranxene-T 7.5 Mg  Tabs (Clorazepate Dipotassium) .... Once Daily As Needed 9)  Synthroid 100 Mcg  Tabs (Levothyroxine Sodium) .... Once Daily 10)  Aspirin 81 Mg Tbec (Aspirin) .... Take One Tablet By Mouth Every Other Day 11)  Vitamin E 400 Unit Caps (Vitamin E) .... Take One Capsule By Mouth Daily 12)  Fish Oil 1000 Mg Caps (Omega-3 Fatty Acids) .... Take One Capsule By Mouth Daily 13)  Tikosyn 250 Mcg Caps (Dofetilide) .Marland Kitchen.. 1 Two Times A Day 14)  Tikosyn 125 Mcg Caps (Dofetilide) .... Take 1 Capsule By Mouth Two Times A Day  Allergies (verified): 1)  ! Novocain 2)  ! Niacin 3)  ! * Statins 4)  ! Altace 5)  ! Codeine    ICD Specifications Following MD:  Sherryl Manges, MD     ICD Vendor:  [facility] [employer]     ICD Model Number:  [phone]     ICD Serial Number:  [id number] ICD DOI:  11/12/2005     ICD Implanting MD:  Sherryl Manges, MD  Lead 1:    Location: RA     DOI: 11/12/2005     Model #: [id number]     Serial #: [id number]     Status: active Lead 2:    Location: RV     DOI: 11/12/2005     Model #: [id number]     Serial #: [id number]     Status: active Lead 3:    Location: LV     DOI: 11/12/2005     Model #: [id number]     Serial #: [id number]     Status: active  Indications::  HCM,CHF   ICD Follow Up Remote Check?  No Battery Voltage:  2.57 V     Charge Time:  12.3 seconds     Underlying rhythm:  SRwith PAC's ICD Dependent:  No        ICD Device Measurements Atrium:  Amplitude: 1.6 mV, Impedance: 430 ohms, Threshold: 0.5 V at 0.5 msec Right Ventricle:  Amplitude: 10.1 mV, Impedance: 365 ohms, Threshold: 0.75 V at 0.5 msec Left Ventricle:  Impedance: 690 ohms, Threshold: 0.5 V at 0.5 msec  Episodes MS Episodes:  0     Coumadin:  Yes Shock:  0     ATP:  0     Nonsustained:  0     Atrial Pacing:  99%     Ventricular Pacing:  98%  Brady Parameters Mode DDDR     Lower Rate Limit:  70     Upper Rate Limit 115 PAV 200     Sensed AV Delay:  150  Tachy Zones VF:  231     VT:  200     VT1:  171  Next Cardiology Appt Due:  10/12/2009 Tech Comments:  No parameter changes.  Device function normal.  Underlying rhythm today SRwith frequent PAC's.  ROV 3 months clinic. Altha Harm, LPN  August 01, 2009 11:30 AM

## 2010-02-11 NOTE — Assessment & Plan Note (Signed)
Summary: PT/NJR rsc appt time per doc/njr   Nurse Visit   Allergies: 1)  ! Novocain 2)  ! Niacin 3)  ! * Statins 4)  ! Altace 5)  ! Codeine Laboratory Results   Blood Tests      INR: 2.8   (Normal Range: 0.88-1.12   Therap INR: 2.0-3.5) Comments: Rita Ohara  November 12, 2009 1:36 PM     Orders Added: 1)  Est. Patient Level I [99211] 2)  Protime [16109UE]   ANTICOAGULATION RECORD PREVIOUS REGIMEN & LAB RESULTS Anticoagulation Diagnosis:  Atrial Fibrillation on  12/24/2008 Previous INR Goal Range:  2.0-3.0 on  08/24/2006 Previous INR:  2.8 on  10/17/2009 Previous Coumadin Dose(mg):  2.5mg ,5mg  alt. on  03/12/2009 Previous Regimen:  same on  08/20/2009 Previous Coagulation Comments:  OV on  09/01/2007  NEW REGIMEN & LAB RESULTS Current INR: 2.8 Regimen: same  Repeat testing in: 4 weeks  Anticoagulation Visit Questionnaire Coumadin dose missed/changed:  No Abnormal Bleeding Symptoms:  No  Any diet changes including alcohol intake, vegetables or greens since the last visit:  No Any illnesses or hospitalizations since the last visit:  No Any signs of clotting since the last visit (including chest discomfort, dizziness, shortness of breath, arm tingling, slurred speech, swelling or redness in leg):  No  MEDICATIONS LOTENSIN 10 MG  TABS (BENAZEPRIL HCL) Take 1 tablet by mouth two times a day LASIX 20 MG  TABS (FUROSEMIDE) Take 1 tablet by mouth once a day ZETIA 10 MG  TABS (EZETIMIBE) Take 1 tablet by mouth once a day DOVONEX   SOLN (CALCIPOTRIENE SOLN) as directed COUMADIN 5 MG  TABS (WARFARIN SODIUM) alternate 5 mg and 2.5 every other day COREG 12.5 MG  TABS (CARVEDILOL) take one tablet by mouth two times a day PROCTOSOL HC 2.5 %  CREA (HYDROCORTISONE) as needed TRANXENE-T 7.5 MG  TABS (CLORAZEPATE DIPOTASSIUM) once daily as needed SYNTHROID 100 MCG  TABS (LEVOTHYROXINE SODIUM) once daily [BMN] ASPIRIN 81 MG TBEC (ASPIRIN) Take one tablet by mouth every  other day VITAMIN E 400 UNIT CAPS (VITAMIN E) take one capsule by mouth daily TIKOSYN 250 MCG CAPS (DOFETILIDE) 1 two times a day TIKOSYN 125 MCG CAPS (DOFETILIDE) Take 1 capsule by mouth two times a day

## 2010-02-11 NOTE — Letter (Signed)
Summary: Cardioversion/TEE Instructions  Architectural technologist, Main Office  [address]   [place], [place] [address]   Phone: [phone]  Fax: [phone]    Cardioversion / TEE Cardioversion Instructions  You are scheduled for a Cardioversion  on  06/25/2009 with Dr. Antoine Poche.   Please arrive at the [facility] at  10 a.m. on the day of your procedure.  1)   DIET:  A)   Nothing to eat or drink after midnight except your medications with a sip of water.    2)   MAKE SURE YOU TAKE YOUR COUMADIN.  3)   A)   DO NOT TAKE these medications before your procedure:      ___________________________________________________________________     ___________________________________________________________________     ___________________________________________________________________  B)   YOU MAY TAKE ALL of your remaining medications with a small amount of water.   5)  Must have a responsible person to drive you home.  6)   Bring a current list of your medications and current insurance cards.   * Special Note:  Every effort is made to have your procedure done on time. Occasionally there are emergencies that present themselves at the hospital that may cause delays. Please be patient if a delay does occur.  * If you have any questions after you get home, please call the office at [phone].

## 2010-02-11 NOTE — Assessment & Plan Note (Signed)
Summary: PT/NJR   Nurse Visit   Allergies: 1)  ! Novocain 2)  ! Niacin 3)  ! * Statins 4)  ! Altace 5)  ! Codeine Laboratory Results   Blood Tests     PT: 20.7 s   (Normal Range: 10.6-13.4)  INR: 2.9   (Normal Range: 0.88-1.12   Therap INR: 2.0-3.5) Comments: Rita Ohara  May 21, 2009 11:30 AM     Orders Added: 1)  Est. Patient Level I [99211] 2)  Protime [[id number]]   ANTICOAGULATION RECORD PREVIOUS REGIMEN & LAB RESULTS Anticoagulation Diagnosis:  Atrial Fibrillation on  12/24/2008 Previous INR Goal Range:  2.0-3.0 on  08/24/2006 Previous INR:  3.4 on  05/07/2009 Previous Coumadin Dose(mg):  2.5mg ,5mg  alt. on  03/12/2009 Previous Regimen:  same dose on  03/12/2009 Previous Coagulation Comments:  OV on  09/01/2007  NEW REGIMEN & LAB RESULTS Current INR: 2.9 Regimen: same  Repeat testing in: 1 month  Anticoagulation Visit Questionnaire Coumadin dose missed/changed:  No Abnormal Bleeding Symptoms:  No  Any diet changes including alcohol intake, vegetables or greens since the last visit:  No Any illnesses or hospitalizations since the last visit:  No Any signs of clotting since the last visit (including chest discomfort, dizziness, shortness of breath, arm tingling, slurred speech, swelling or redness in leg):  No  MEDICATIONS LOTENSIN 10 MG  TABS (BENAZEPRIL HCL) Take 1 tablet by mouth two times a day LASIX 20 MG  TABS (FUROSEMIDE) Take 1 tablet by mouth once a day ZETIA 10 MG  TABS (EZETIMIBE) Take 1 tablet by mouth once a day DOVONEX   SOLN (CALCIPOTRIENE SOLN) as directed COUMADIN 5 MG  TABS (WARFARIN SODIUM) alternate 5 mg and 2.5 every other day COREG 12.5 MG  TABS (CARVEDILOL) take one tablet by mouth two times a day PROCTOSOL HC 2.5 %  CREA (HYDROCORTISONE) as needed TRANXENE-T 7.5 MG  TABS (CLORAZEPATE DIPOTASSIUM) once daily as needed SYNTHROID 100 MCG  TABS (LEVOTHYROXINE SODIUM) once daily [BMN] ASPIRIN 81 MG TBEC (ASPIRIN) Take one tablet by  mouth every other day VITAMIN E 400 UNIT CAPS (VITAMIN E) take one capsule by mouth daily FISH OIL 1000 MG CAPS (OMEGA-3 FATTY ACIDS) take one capsule by mouth daily TIKOSYN 250 MCG CAPS (DOFETILIDE) 1 two times a day

## 2010-02-11 NOTE — Assessment & Plan Note (Signed)
Summary: PT/RCD   Nurse Visit   Allergies: 1)  ! Novocain 2)  ! Niacin 3)  ! * Statins 4)  ! Altace 5)  ! Codeine Laboratory Results   Blood Tests   Date/Time Received: September 18, 2009 12:38 PM  Date/Time Reported: September 18, 2009 12:38 PM    INR: 2.4   (Normal Range: 0.88-1.12   Therap INR: 2.0-3.5) Comments: Wynona Canes, CMA  September 18, 2009 12:38 PM     Orders Added: 1)  Est. Patient Level I [99211] 2)  Protime [16109UE]  Laboratory Results   Blood Tests      INR: 2.4   (Normal Range: 0.88-1.12   Therap INR: 2.0-3.5) Comments: Wynona Canes, CMA  September 18, 2009 12:38 PM       ANTICOAGULATION RECORD PREVIOUS REGIMEN & LAB RESULTS Anticoagulation Diagnosis:  Atrial Fibrillation on  12/24/2008 Previous INR Goal Range:  2.0-3.0 on  08/24/2006 Previous INR:  1.8 on  08/20/2009 Previous Coumadin Dose(mg):  2.5mg ,5mg  alt. on  03/12/2009 Previous Regimen:  same on  08/20/2009 Previous Coagulation Comments:  OV on  09/01/2007  NEW REGIMEN & LAB RESULTS Current INR: 2.4 Regimen: same  (no change)       Repeat testing in: 4 weeks MEDICATIONS LOTENSIN 10 MG  TABS (BENAZEPRIL HCL) Take 1 tablet by mouth two times a day LASIX 20 MG  TABS (FUROSEMIDE) Take 1 tablet by mouth once a day ZETIA 10 MG  TABS (EZETIMIBE) Take 1 tablet by mouth once a day DOVONEX   SOLN (CALCIPOTRIENE SOLN) as directed COUMADIN 5 MG  TABS (WARFARIN SODIUM) alternate 5 mg and 2.5 every other day COREG 12.5 MG  TABS (CARVEDILOL) take one tablet by mouth two times a day PROCTOSOL HC 2.5 %  CREA (HYDROCORTISONE) as needed TRANXENE-T 7.5 MG  TABS (CLORAZEPATE DIPOTASSIUM) once daily as needed SYNTHROID 100 MCG  TABS (LEVOTHYROXINE SODIUM) once daily [BMN] ASPIRIN 81 MG TBEC (ASPIRIN) Take one tablet by mouth every other day VITAMIN E 400 UNIT CAPS (VITAMIN E) take one capsule by mouth daily FISH OIL 1000 MG CAPS (OMEGA-3 FATTY ACIDS) take one capsule by mouth  daily TIKOSYN 250 MCG CAPS (DOFETILIDE) 1 two times a day TIKOSYN 125 MCG CAPS (DOFETILIDE) Take 1 capsule by mouth two times a day   Anticoagulation Visit Questionnaire      Coumadin dose missed/changed:  No      Abnormal Bleeding Symptoms:  No   Any diet changes including alcohol intake, vegetables or greens since the last visit:  No Any illnesses or hospitalizations since the last visit:  No Any signs of clotting since the last visit (including chest discomfort, dizziness, shortness of breath, arm tingling, slurred speech, swelling or redness in leg):  No

## 2010-02-11 NOTE — Progress Notes (Signed)
Summary: has not heard about meds   Phone Note Call from Patient Call back at Home Phone [phone] Call back at cell# [phone]   [name]: Patient Reason for Call: Talk to Nurse, Talk to Doctor Summary of Call: per pt you told her you today if she had not heard anything about her meds so she was calling Initial call taken by: Omer Jack,  March 12, 2009 8:42 AM  Follow-up for Phone Call        Insurance company would not provide tier override. Called patient and left message on machine with information. Gypsy Balsam RN BSN  March 12, 2009 4:17 PM

## 2010-02-13 ENCOUNTER — Encounter (INDEPENDENT_AMBULATORY_CARE_PROVIDER_SITE_OTHER): Payer: Medicare Other

## 2010-02-13 ENCOUNTER — Encounter: Payer: Self-pay | Admitting: Internal Medicine

## 2010-02-13 ENCOUNTER — Ambulatory Visit: Admit: 2010-02-13 | Payer: Self-pay | Admitting: Internal Medicine

## 2010-02-13 ENCOUNTER — Other Ambulatory Visit: Payer: Self-pay | Admitting: Internal Medicine

## 2010-02-13 ENCOUNTER — Other Ambulatory Visit (INDEPENDENT_AMBULATORY_CARE_PROVIDER_SITE_OTHER): Payer: Medicare Other

## 2010-02-13 DIAGNOSIS — I5022 Chronic systolic (congestive) heart failure: Secondary | ICD-10-CM

## 2010-02-13 DIAGNOSIS — I428 Other cardiomyopathies: Secondary | ICD-10-CM

## 2010-02-13 LAB — BASIC METABOLIC PANEL
BUN: 28 mg/dL — ABNORMAL HIGH (ref 6–23)
Creatinine, Ser: 1.4 mg/dL — ABNORMAL HIGH (ref 0.4–1.2)
GFR: 38.54 mL/min — ABNORMAL LOW (ref >60.00)
Glucose, Bld: 69 mg/dL — ABNORMAL LOW (ref 70–99)
Potassium: 4.2 mEq/L (ref 3.5–5.1)

## 2010-02-13 NOTE — Progress Notes (Signed)
   Phone Note Outgoing Call   Call placed by: rhonda Call placed to: Patient Details for Reason: need blood sample for genetic testing Summary of Call: Pt will be in town on the 16th around 1130 and will come in for blood sample. She will also discuss with her family again about the genetic testing as she may not do it. pt will let us know. Initial call taken by: Claris Gladden RN,  January 14, 2010 9:51 AM

## 2010-02-13 NOTE — Assessment & Plan Note (Signed)
Summary: PACER CHECK/JML    Allergies: 1)  ! Novocain 2)  ! Niacin 3)  ! * Statins 4)  ! Altace 5)  ! Codeine    ICD Specifications Following MD:  Sherryl Manges, MD     ICD Vendor:  [facility] [employer]     ICD Model Number:  [id number]     ICD Serial Number:  [id number] ICD DOI:  11/12/2005     ICD Implanting MD:  Sherryl Manges, MD  Lead 1:    Location: RA     DOI: 11/12/2005     Model #: [id number]     Serial #: [id number]     Status: active Lead 2:    Location: RV     DOI: 11/12/2005     Model #: 7000     Serial #: [id number]     Status: active Lead 3:    Location: LV     DOI: 11/12/2005     Model #: 4538     Serial #: [id number]     Status: active  Indications::  HCM,CHF   ICD Follow Up Battery Voltage:  2.57 V     Charge Time:  12.4 seconds     ICD Dependent:  No       ICD Device Measurements Atrium:  Impedance: 435 ohms,  Right Ventricle:  Impedance: 375 ohms,  Left Ventricle:  Impedance: 720 ohms,  Shock Impedance: 34 ohms   Episodes MS Episodes:  212     Percent Mode Switch:  <1%     Coumadin:  Yes Shock:  0     ATP:  0     Nonsustained:  0     Atrial Pacing:  95%     Ventricular Pacing:  >99%  Brady Parameters Mode DDDR     Lower Rate Limit:  70     Upper Rate Limit 115 PAV 200     Sensed AV Delay:  150  Tachy Zones VF:  231     VT:  200     VT1:  171     Next Cardiology Appt Due:  02/13/2010 Tech Comments:  INTERROGATION ONLY---212 AMS EPISODES--ALL LESS THAN 1 MINUTE.  LONGEST EPISODE WAS 16 SECONDS.  PVC COUNT 20,235 SINCE LAST CHECK 12-17-09.  ROV 02-13-10 @ 1100 W/[facility]. Sandra [name]  January 16, 2010 4:11 PM

## 2010-02-13 NOTE — Progress Notes (Signed)
Summary: pt having chest disconfort   Phone Note Call from Patient Call back at Home Phone [phone]   [name]: Patient Reason for Call: Talk to Nurse, Talk to Doctor Summary of Call: pt is chest discomfort she feels like she may be in a-fib or having pvc's but she is not sure but is isver uncomfortable and she wants to talk to someone cause her pacer and rhythm has not been right since dr Graciela Husbands msde the changes Initial call taken by: Omer Jack,  January 16, 2010 10:36 AM  Follow-up for Phone Call        pt stating since Sunday evening she has been having a quivering in her chest.  states she feels like she is going crazy and her heart is skipping.  She denies SOB and CP.  She has not checked her BP or HR today but will.  Pt was instructed to take Metoprolol as ordered as needed for when she feels like this and call back if feeling doesnt improve.  Will forward information to Dr Graciela Husbands and his nurse for follow up this afternoon. Follow-up by: Charolotte Capuchin, RN,  January 16, 2010 10:56 AM  Additional Follow-up for Phone Call Additional follow up Details #1::        per pt took half pill of metoprolol. pt describes shaky  sensation in heart are when sitting, not w/activity. denies pain.  pt states cannot really describe what she feels but knows something is not right. states change from last appt when she believes her icd was reprogrammed. bp yest b4 med was 135/81 70-p. pt will come in at 2 to have icd interogated.  Additional Follow-up by: Claris Gladden RN,  January 16, 2010 11:59 AM

## 2010-02-13 NOTE — Cardiovascular Report (Signed)
Summary: Office Visit   Office Visit   Imported By: Roderic Ovens 01/21/2010 16:15:00  _____________________________________________________________________  External Attachment:    Type:   Image     Comment:   External Document

## 2010-02-13 NOTE — Assessment & Plan Note (Signed)
Summary: 3 month rov/njr   Vital Signs:  Patient profile:   77 year old female Height:      65 inches Weight:      150 pounds BMI:     25.05 Temp:     98.2 degrees F oral Pulse rate:   72 / minute Pulse rhythm:   regular Resp:     14 per minute BP sitting:   124 / 76  (left arm)  Vitals Entered By: Willy Eddy, LPN (December 30, 2009 10:30 AM) CC: roa, Lipid Management Is Patient Diabetic? No   Primary Care Josehua Hammar:  Darryll Capers, MD  CC:  roa and Lipid Management.  History of Present Illness: had an episode of Atrial fib her daughter is at [facility] for change in her medications fm hx of HOCM   Lipid Management History:      Positive NCEP/ATP III risk factors include female age 58 years old or older, HDL cholesterol less than 40, and family history for ischemic heart disease (females less than 47 years old & males less than 63 years old).  Negative NCEP/ATP III risk factors include non-tobacco-user status, non-hypertensive, no ASHD (atherosclerotic heart disease), no prior stroke/TIA, no peripheral vascular disease, and no history of aortic aneurysm.     Preventive Screening-Counseling & Management  Alcohol-Tobacco     Smoking Status: quit     Smoking Cessation Counseling: no     Passive Smoke Exposure: no     Tobacco Counseling: not indicated; no tobacco use  Problems Prior to Update: 1)  Cough  (ICD-786.2) 2)  Hypotension  (ICD-458.9) 3)  Premature Ventricular Contractions  (ICD-427.69) 4)  Cellulitis and Abscess of Unspecified Site  (ICD-682.9) 5)  Dog Bite  (ICD-E906.0) 6)  Systolic Heart Failure, Chronic  (ICD-428.22) 7)  Implantation of Defibrillator, St. Jude Atlas V343  (ICD-V45.02) 8)  Atrial Fibrillation  (ICD-427.31) 9)  Hypertrophic Obstructive Cardiomyopathy  (ICD-425.1) 10)  Cough Variant Asthma  (ICD-493.82) 11)  Obst Chronic Bronchitis W/acute Bronchitis  (ICD-491.22) 12)  Cough  (ICD-786.2) 13)  Coumadin Therapy  (ICD-V58.61) 14)  Personal  History of Colonic Polyps  (ICD-V12.72) 15)  Unspecified Labyrinthine Dysfunction  (ICD-386.50) 16)  Lumbosacral Strain  (ICD-846.0) 17)  Diverticulitis, Acute  (ICD-562.11) 18)  Contusion of Upper Arm  (ICD-923.03) 19)  Loc Osteoarthros Not Spec Prim/sec Lower Leg  (ICD-715.36) 20)  Contusion of Hip  (ICD-924.01) 21)  Uns Advrs Eff Uns Rx Medicinal&biological Sbstnc  (ICD-995.20) 22)  Aftercare, Long-term Use, Anticoagulants  (ICD-V58.61) 23)  Encounter For Therapeutic Drug Monitoring  (ICD-V58.83) 24)  Hyperlipidemia  (ICD-272.4) 25)  Hypothyroidism  (ICD-244.9)  Current Problems (verified): 1)  Cough  (ICD-786.2) 2)  Hypotension  (ICD-458.9) 3)  Premature Ventricular Contractions  (ICD-427.69) 4)  Cellulitis and Abscess of Unspecified Site  (ICD-682.9) 5)  Dog Bite  (ICD-E906.0) 6)  Systolic Heart Failure, Chronic  (ICD-428.22) 7)  Implantation of Defibrillator, St. 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.50) 16)  Lumbosacral Strain  (ICD-846.0) 17)  Diverticulitis, Acute  (ICD-562.11) 18)  Contusion of Upper Arm  (ICD-923.03) 19)  Loc Osteoarthros Not Spec Prim/sec Lower Leg  (ICD-715.36) 20)  Contusion of Hip  (ICD-924.01)  21)  Uns Advrs Eff Uns Rx Medicinal&biological Sbstnc  (ICD-995.20) 22)  Aftercare, Long-term Use, Anticoagulants  (ICD-V58.61) 23)  Encounter For Therapeutic Drug Monitoring  (ICD-V58.83) 24)  Hyperlipidemia  (ICD-272.4) 25)  Hypothyroidism  (ICD-244.9)  Medications Prior to Update: 1)  Lotensin 10 Mg  Tabs (Benazepril Hcl) .... Take 1 Tablet By Mouth Two Times A Day 2)  Lasix 20 Mg  Tabs (Furosemide)  .... Take 2 Tablet By Mouth Once A Day 3)  Zetia 10 Mg  Tabs (Ezetimibe) .... Take 1 Tablet By Mouth Once A Day 4)  Dovonex   Soln (Calcipotriene Soln) .... As Directed 5)  Coumadin 5 Mg  Tabs (Warfarin Sodium) .... Alternate 5 Mg and 2.5 Every Other Day 6)  Coreg 12.5 Mg  Tabs (Carvedilol) .... Take One Tablet By Mouth Two Times A Day 7)  Proctosol Hc 2.5 %  Crea (Hydrocortisone) .... As Needed 8)  Tranxene-T 7.5 Mg  Tabs (Clorazepate Dipotassium) .... Once Daily As Needed 9)  Synthroid 100 Mcg  Tabs (Levothyroxine Sodium) .... Once Daily 10)  Aspirin 81 Mg Tbec (Aspirin) .... Take One Tablet By Mouth Every Other Day 11)  Vitamin E 400 Unit Caps (Vitamin E) .... Take One Capsule By Mouth Daily 12)  Tikosyn 250 Mcg Caps (Dofetilide) .Marland Kitchen.. 1 Two Times A Day 13)  Tikosyn 125 Mcg Caps (Dofetilide) .... Take 1 Capsule By Mouth Two Times A Day 14)  Metoprolol Succinate 50 Mg Xr24h-Tab (Metoprolol Succinate) .... As Directed  Current Medications (verified): 1)  Lotensin 10 Mg  Tabs (Benazepril Hcl) .... Take 1 Tablet By Mouth Two Times A Day 2)  Lasix 20 Mg  Tabs (Furosemide) .... Take 2 Tablet By Mouth Once A Day 3)  Zetia 10 Mg  Tabs (Ezetimibe) .... Take 1 Tablet By Mouth Once A Day 4)  Dovonex   Soln (Calcipotriene Soln) .... As Directed 5)  Coumadin 5 Mg  Tabs (Warfarin Sodium) .... Alternate 5 Mg and 2.5 Every Other Day 6)  Coreg 12.5 Mg  Tabs (Carvedilol) .... Take One Tablet By Mouth Two Times A Day 7)  Proctosol Hc 2.5 %  Crea (Hydrocortisone) .... As Needed 8)  Tranxene-T 7.5 Mg  Tabs (Clorazepate Dipotassium) .... Once Daily As Needed 9)  Synthroid 100 Mcg  Tabs (Levothyroxine Sodium) .... Once Daily 10)  Aspirin 81 Mg Tbec (Aspirin) .... Take One Tablet By Mouth Every Other Day 11)  Vitamin E 400 Unit Caps (Vitamin E) .... Take One Capsule By Mouth Daily 12)  Tikosyn 250 Mcg Caps (Dofetilide) .Marland Kitchen.. 1 Two Times A Day 13)  Tikosyn 125 Mcg Caps (Dofetilide) .... Take 1 Capsule By Mouth  Two Times A Day 14)  Metoprolol Succinate 50 Mg Xr24h-Tab (Metoprolol Succinate) .... As Directed  Allergies (verified): 1)  ! Novocain 2)  ! Niacin 3)  ! * Statins 4)  ! Altace 5)  ! Codeine  Past History:  Family History: Last updated: 06/24/2009 Family History of Heart Disease: Mother and multiple family members on mother side.  Family History of Colon Cancer: Father had malignant polyp removed via colonoscopy in his 22's ("cancer cells in the polyp" per patient) Daughter with cardiomyopathy  Social History: Last updated: 09/10/2008 Retired Married Alcohol use-no Drug use-no Patient is a former smoker.  Patient does not get regular exercise.   Risk Factors: Exercise: no (09/10/2008)  Risk Factors: Smoking Status: quit (12/30/2009) Passive Smoke Exposure: no (12/30/2009)  Past medical, surgical, family and social histories (including risk factors) reviewed,  and no changes noted (except as noted below).  Past Medical History: Reviewed history from 07/10/2009 and no changes required. Hyperlipidemia Atrial fibrillation Hypothyroidism Bradycardia Hypertrophic nonobstructive cardiomyopathy Anal Fissure Adenomatous Colon Polyps Diverticulosis Thyroid disease AICD- St. Jude Atlas v343 Systolic heart failure (EF 40-45%) Direct current cardioversion-2011  Past Surgical History: Reviewed history from 07/10/2009 and no changes required. Pacer/defibrilator 11/07 St Jude  Direct current cardioversion-2011 PMH-FH-SH reviewed-no changes except otherwise noted  Family History: Reviewed history from 06/24/2009 and no changes required. Family History of Heart Disease: Mother and multiple family members on mother side.  Family History of Colon Cancer: Father had malignant polyp removed via colonoscopy in his 90's ("cancer cells in the polyp" per patient) Daughter with cardiomyopathy  Social History: Reviewed history from 09/10/2008 and no changes  required. Retired Married Alcohol use-no Drug use-no Patient is a former smoker.  Patient does not get regular exercise.   Review of Systems  The patient denies anorexia, fever, weight loss, weight gain, vision loss, decreased hearing, hoarseness, chest pain, syncope, dyspnea on exertion, peripheral edema, prolonged cough, headaches, hemoptysis, abdominal pain, melena, hematochezia, severe indigestion/heartburn, hematuria, incontinence, genital sores, muscle weakness, suspicious skin lesions, transient blindness, difficulty walking, depression, unusual weight change, abnormal bleeding, enlarged lymph nodes, angioedema, and breast masses.    Physical Exam  General:  Well-developed,well-nourished,in no acute distress; alert,appropriate and cooperative throughout examination Head:  Normocephalic and atraumatic without obvious abnormalities. No apparent alopecia or balding. Eyes:  No corneal or conjunctival inflammation noted. EOMI.  Ears:  R ear normal and L ear normal.   Nose:  no external deformity and nose piercing noted.   Neck:  No deformities, masses, or tenderness noted. Lungs:  Normal respiratory effort, chest expands symmetrically. Lungs are clear to auscultation, no crackles or wheezes. Heart:  Normal rate and regular rhythm. S1 and S2 normal without gallop, murmur, click, rub or other extra sounds. Abdomen:  Bowel sounds positive,abdomen soft and non-tender without masses, organomegaly or hernias noted. Msk:  No deformity or scoliosis noted of thoracic or lumbar spine.   Extremities:  No clubbing, cyanosis, edema, or deformity noted with normal full range of motion of all joints.   Neurologic:  No cranial nerve deficits noted. Station and gait are normal. Plantar reflexes are down-going bilaterally. DTRs are symmetrical throughout. Sensory, motor and coordinative functions appear intact.   Impression & Recommendations:  Problem # 1:  HYPERLIPIDEMIA (ICD-272.4)  Her updated  medication list for this problem includes:    Zetia 10 Mg Tabs (Ezetimibe) .Marland Kitchen... Take 1 tablet by mouth once a day  Labs Reviewed: SGOT: 35 (06/24/2009)   SGPT: 30 (06/24/2009)  Lipid Goals: Chol Goal: 200 (05/10/2007)   HDL Goal: 40 (05/10/2007)   LDL Goal: 130 (05/10/2007)   TG Goal: 150 (05/10/2007)  Prior 10 Yr Risk Heart Disease: 17 % (09/30/2009)   HDL:34.90 (06/24/2009), 43.90 (09/04/2008)  LDL:124 (06/24/2009), DEL (07/04/2007)  Chol:185 (06/24/2009), 240 (09/04/2008)  Trig:130.0 (06/24/2009), 130 (07/04/2007)  Problem # 2:  HYPOTHYROIDISM (ICD-244.9)  Her updated medication list for this problem includes:    Synthroid 100 Mcg Tabs (Levothyroxine sodium) ..... Once daily  Labs Reviewed: TSH: 0.17 (09/04/2008)    Chol: 185 (06/24/2009)   HDL: 34.90 (06/24/2009)   LDL: 124 (06/24/2009)   TG: 130.0 (06/24/2009)  Orders: Venipuncture (16109) TLB-TSH (Thyroid Stimulating Hormone) (84443-TSH)  Problem # 3:  ATRIAL FIBRILLATION (ICD-427.31) the medications are working well with the tikosyn Her updated medication list for this problem includes:    Coumadin  5 Mg Tabs (Warfarin sodium) .Marland Kitchen... Alternate 5 mg and 2.5 every other day    Coreg 12.5 Mg Tabs (Carvedilol) .Marland Kitchen... Take one tablet by mouth two times a day    Aspirin 81 Mg Tbec (Aspirin) .Marland Kitchen... Take one tablet by mouth every other day    Tikosyn 250 Mcg Caps (Dofetilide) .Marland Kitchen... 1 two times a day    Tikosyn 125 Mcg Caps (Dofetilide) .Marland Kitchen... Take 1 capsule by mouth two times a day    Metoprolol Succinate 50 Mg Xr24h-tab (Metoprolol succinate) .Marland Kitchen... As directed  Reviewed the following: PT: 20.7 (05/21/2009)   INR: 2.3 (12/18/2009) Coumadin Dose (weekly): 25 mg (12/24/2008) Prior Coumadin Dose (weekly): 25 mg (12/24/2008) Next Protime: 4 weeks (dated on 12/18/2009)  Problem # 4:  HYPERTROPHIC OBSTRUCTIVE CARDIOMYOPATHY (ICD-425.1) stable  Problem # 5:  CHRONIC KIDNEY DISEASE STAGE II (MILD) (ICD-585.2)  cardiology is  monitering but her creatinine is 1.6 with decreased  GF consider referral to renal if progresses  Labs Reviewed: BUN: 33 (12/09/2009)   Cr: 1.6 (12/09/2009)    Hgb: 10.7 (06/24/2009)   Hct: 31.7 (06/24/2009)   Ca++: 9.7 (12/09/2009)    TP: 6.1 (06/24/2009)   Alb: 3.6 (06/24/2009)  Orders: TLB-BMP (Basic Metabolic Panel-BMET) (80048-METABOL)  Complete Medication List: 1)  Lotensin 10 Mg Tabs (Benazepril hcl) .... Take 1 tablet by mouth two times a day 2)  Lasix 20 Mg Tabs (Furosemide) .... Take 2 tablet by mouth once a day 3)  Zetia 10 Mg Tabs (Ezetimibe) .... Take 1 tablet by mouth once a day 4)  Dovonex Soln (Calcipotriene soln) .... As directed 5)  Coumadin 5 Mg Tabs (Warfarin sodium) .... Alternate 5 mg and 2.5 every other day 6)  Coreg 12.5 Mg Tabs (Carvedilol) .... Take one tablet by mouth two times a day 7)  Proctosol Hc 2.5 % Crea (Hydrocortisone) .... As needed 8)  Tranxene-t 7.5 Mg Tabs (Clorazepate dipotassium) .... Once daily as needed 9)  Synthroid 100 Mcg Tabs (Levothyroxine sodium) .... Once daily 10)  Aspirin 81 Mg Tbec (Aspirin) .... Take one tablet by mouth every other day 11)  Vitamin E 400 Unit Caps (Vitamin e) .... Take one capsule by mouth daily 12)  Tikosyn 250 Mcg Caps (Dofetilide) .Marland Kitchen.. 1 two times a day 13)  Tikosyn 125 Mcg Caps (Dofetilide) .... Take 1 capsule by mouth two times a day 14)  Metoprolol Succinate 50 Mg Xr24h-tab (Metoprolol succinate) .... As directed  Lipid Assessment/Plan:      Based on NCEP/ATP III, the patient's risk factor category is "2 or more risk factors and a calculated 10 year CAD risk of < 20%".  The patient's lipid goals are as follows: Total cholesterol goal is 200; LDL cholesterol goal is 130; HDL cholesterol goal is 40; Triglyceride goal is 150.    Patient Instructions: 1)  Please schedule a follow-up appointment in 3 months.   Orders Added: 1)  Est. Patient Level IV [57846] 2)  Est. Patient Level IV [96295] 3)  Venipuncture  [28413] 4)  TLB-TSH (Thyroid Stimulating Hormone) [84443-TSH] 5)  TLB-BMP (Basic Metabolic Panel-BMET) [80048-METABOL]  Appended Document: Orders Update     Clinical Lists Changes  Orders: Added new Service order of Venipuncture [phone]) - Signed

## 2010-02-13 NOTE — Assessment & Plan Note (Signed)
Summary: PT/NJR   Nurse Visit   Allergies: 1)  ! Novocain 2)  ! Niacin 3)  ! * Statins 4)  ! Altace 5)  ! Codeine Laboratory Results   Blood Tests      INR: 2.2   (Normal Range: 0.88-1.12   Therap INR: 2.0-3.5) Comments: Rita Ohara  January 15, 2010 3:46 PM     Orders Added: 1)  Est. Patient Level I [99211] 2)  Protime [16109UE]   ANTICOAGULATION RECORD PREVIOUS REGIMEN & LAB RESULTS Anticoagulation Diagnosis:  Atrial Fibrillation on  12/24/2008 Previous INR Goal Range:  2.0-3.0 on  08/24/2006 Previous INR:  2.3 on  12/18/2009 Previous Coumadin Dose(mg):  2.5mg ,5mg  alt. on  03/12/2009 Previous Regimen:  same on  12/18/2009 Previous Coagulation Comments:  OV on  09/01/2007  NEW REGIMEN & LAB RESULTS Current INR: 2.2 Regimen: same  Repeat testing in: 4 weeks  Anticoagulation Visit Questionnaire Coumadin dose missed/changed:  No Abnormal Bleeding Symptoms:  No  Any diet changes including alcohol intake, vegetables or greens since the last visit:  No Any illnesses or hospitalizations since the last visit:  No Any signs of clotting since the last visit (including chest discomfort, dizziness, shortness of breath, arm tingling, slurred speech, swelling or redness in leg):  Yes  MEDICATIONS LOTENSIN 10 MG  TABS (BENAZEPRIL HCL) Take 1 tablet by mouth two times a day LASIX 20 MG  TABS (FUROSEMIDE) Take 2 tablet by mouth once a day ZETIA 10 MG  TABS (EZETIMIBE) Take 1 tablet by mouth once a day DOVONEX   SOLN (CALCIPOTRIENE SOLN) as directed COUMADIN 5 MG  TABS (WARFARIN SODIUM) alternate 5 mg and 2.5 every other day COREG 12.5 MG  TABS (CARVEDILOL) take one tablet by mouth two times a day PROCTOSOL HC 2.5 %  CREA (HYDROCORTISONE) as needed TRANXENE-T 7.5 MG  TABS (CLORAZEPATE DIPOTASSIUM) once daily as needed SYNTHROID 100 MCG  TABS (LEVOTHYROXINE SODIUM) once daily [BMN] ASPIRIN 81 MG TBEC (ASPIRIN) Take one tablet by mouth every other day VITAMIN E 400 UNIT  CAPS (VITAMIN E) take one capsule by mouth daily TIKOSYN 250 MCG CAPS (DOFETILIDE) 1 two times a day TIKOSYN 125 MCG CAPS (DOFETILIDE) Take 1 capsule by mouth two times a day METOPROLOL SUCCINATE 50 MG XR24H-TAB (METOPROLOL SUCCINATE) as directed

## 2010-02-13 NOTE — Assessment & Plan Note (Signed)
Summary: pt//lch/pt rsc/cjr   Nurse Visit   Allergies: 1)  ! Novocain 2)  ! Niacin 3)  ! * Statins 4)  ! Altace 5)  ! Codeine Laboratory Results   Blood Tests      INR: 2.3   (Normal Range: 0.88-1.12   Therap INR: 2.0-3.5) Comments: Rita Ohara  December 18, 2009 3:55 PM     Orders Added: 1)  Est. Patient Level I [99211] 2)  Protime [16109UE]   ANTICOAGULATION RECORD PREVIOUS REGIMEN & LAB RESULTS Anticoagulation Diagnosis:  Atrial Fibrillation on  12/24/2008 Previous INR Goal Range:  2.0-3.0 on  08/24/2006 Previous INR:  2.8 on  11/12/2009 Previous Coumadin Dose(mg):  2.5mg ,5mg  alt. on  03/12/2009 Previous Regimen:  same on  11/12/2009 Previous Coagulation Comments:  OV on  09/01/2007  NEW REGIMEN & LAB RESULTS Current INR: 2.3 Regimen: same  Repeat testing in: 4 weeks  Anticoagulation Visit Questionnaire Coumadin dose missed/changed:  No Abnormal Bleeding Symptoms:  No  Any diet changes including alcohol intake, vegetables or greens since the last visit:  No Any illnesses or hospitalizations since the last visit:  No Any signs of clotting since the last visit (including chest discomfort, dizziness, shortness of breath, arm tingling, slurred speech, swelling or redness in leg):  No  MEDICATIONS LOTENSIN 10 MG  TABS (BENAZEPRIL HCL) Take 1 tablet by mouth two times a day LASIX 20 MG  TABS (FUROSEMIDE) Take 2 tablet by mouth once a day ZETIA 10 MG  TABS (EZETIMIBE) Take 1 tablet by mouth once a day DOVONEX   SOLN (CALCIPOTRIENE SOLN) as directed COUMADIN 5 MG  TABS (WARFARIN SODIUM) alternate 5 mg and 2.5 every other day COREG 12.5 MG  TABS (CARVEDILOL) take one tablet by mouth two times a day PROCTOSOL HC 2.5 %  CREA (HYDROCORTISONE) as needed TRANXENE-T 7.5 MG  TABS (CLORAZEPATE DIPOTASSIUM) once daily as needed SYNTHROID 100 MCG  TABS (LEVOTHYROXINE SODIUM) once daily [BMN] ASPIRIN 81 MG TBEC (ASPIRIN) Take one tablet by mouth every other  day VITAMIN E 400 UNIT CAPS (VITAMIN E) take one capsule by mouth daily TIKOSYN 250 MCG CAPS (DOFETILIDE) 1 two times a day TIKOSYN 125 MCG CAPS (DOFETILIDE) Take 1 capsule by mouth two times a day METOPROLOL SUCCINATE 50 MG XR24H-TAB (METOPROLOL SUCCINATE) as directed

## 2010-02-13 NOTE — Cardiovascular Report (Signed)
Summary: Office Visit   Office Visit   Imported By: Roderic Ovens 01/01/2010 09:19:49  _____________________________________________________________________  External Attachment:    Type:   Image     Comment:   External Document

## 2010-02-14 ENCOUNTER — Telehealth: Payer: Self-pay | Admitting: Internal Medicine

## 2010-02-14 NOTE — Letter (Signed)
Summary: [facility]   Imported By: Maryln Gottron 10/09/2009 11:08:34  _____________________________________________________________________  External Attachment:    Type:   Image     Comment:   External Document

## 2010-02-19 ENCOUNTER — Other Ambulatory Visit (INDEPENDENT_AMBULATORY_CARE_PROVIDER_SITE_OTHER): Payer: Medicare Other | Admitting: Internal Medicine

## 2010-02-19 DIAGNOSIS — I4891 Unspecified atrial fibrillation: Secondary | ICD-10-CM

## 2010-02-19 NOTE — Miscellaneous (Signed)
Summary: Orders Update  Clinical Lists Changes  Problems: Added new problem of CHRONIC SYSTOLIC HEART FAILURE (ICD-428.22) Orders: Added new Test order of TLB-BMP (Basic Metabolic Panel-BMET) (80048-METABOL) - Signed Added new Test order of TLB-Magnesium (Mg) (83735-MG) - Signed

## 2010-02-19 NOTE — Progress Notes (Signed)
Summary: question re device   Phone Note Call from Patient Call back at Home Phone [phone] P PH     [name]: Patient Reason for Call: Talk to Nurse Summary of Call: pt states she having trouble with her device. pt states device is going in and out.  Initial call taken by: Roe Coombs,  February 14, 2010 12:46 PM  Follow-up for Phone Call        spoke w/pt---pt had questions about the number of mode switches that was recorded on device check.  221 mode switches but all less than 1 minute.  pt is nervous about the number of mode switches.  pt took 1/2 of metoprolol (as dr Graciela Husbands suggested) and that made pt feel better.  pt to call if feelings get worse. Vella Kohler  February 14, 2010 2:23 PM

## 2010-02-21 ENCOUNTER — Encounter (INDEPENDENT_AMBULATORY_CARE_PROVIDER_SITE_OTHER): Payer: Self-pay | Admitting: *Deleted

## 2010-02-21 ENCOUNTER — Encounter: Payer: Self-pay | Admitting: Internal Medicine

## 2010-02-26 ENCOUNTER — Other Ambulatory Visit: Payer: Self-pay | Admitting: Internal Medicine

## 2010-02-26 NOTE — Patient Instructions (Signed)
  Latest dosing instructions   Total Sun Mon Tue Wed Thu Fri Sat   27.5 5 mg 2.5 mg 5 mg 2.5 mg 5 mg 2.5 mg 5 mg    (5 mg1) (5 mg0.5) (5 mg1) (5 mg0.5) (5 mg1) (5 mg0.5) (5 mg1)        

## 2010-02-27 ENCOUNTER — Encounter: Payer: Self-pay | Admitting: Internal Medicine

## 2010-02-27 NOTE — Cardiovascular Report (Signed)
Summary: Office Visit   Office Visit   Imported By: Roderic Ovens 02/18/2010 15:59:32  _____________________________________________________________________  External Attachment:    Type:   Image     Comment:   External Document

## 2010-02-27 NOTE — Letter (Addendum)
Summary: Generic Letter  Architectural technologist, Main Office  [address]   [place], [place] [address]   Phone: [phone]  Fax: [phone]        February 21, 2010 MRN: [id number]    RE: Sandra [name]     [address]     [place], [place]  [address]    To whom it may concern:   Lona Millard, patient of Dr. Sherryl Manges, requires a refill on her Tikosyn 250 micrograms.  This patients medication prior authorization has been denied.  We would like to appeal this decision on her behalf.    Medication: Tikosyn 250 micrograms  Take one capsule two times a day  Diagnosis: Atrial Fibrillation 427.31 Patient DOB: [birth year]  If you have any further questions please call our office at [phone]. Thank you for your consideration.  Judithe Modest, CMA/AAMA  [facility]          Sincerely,  Judithe Modest CMA  This letter has been electronically signed by your physician.

## 2010-02-27 NOTE — Procedures (Signed)
Summary: Cardiology Device Clinic      Allergies Added:   Current Medications (verified): 1)  Lotensin 10 Mg  Tabs (Benazepril Hcl) .... Take 1 Tablet By Mouth Two Times A Day 2)  Lasix 20 Mg  Tabs (Furosemide) .... Take 2 Tablet By Mouth Once A Day 3)  Zetia 10 Mg  Tabs (Ezetimibe) .... Take 1 Tablet By Mouth Once A Day 4)  Dovonex   Soln (Calcipotriene Soln) .... As Directed 5)  Coumadin 5 Mg  Tabs (Warfarin Sodium) .... Alternate 5 Mg and 2.5 Every Other Day 6)  Coreg 12.5 Mg  Tabs (Carvedilol) .... Take One Tablet By Mouth Two Times A Day 7)  Proctosol Hc 2.5 %  Crea (Hydrocortisone) .... As Needed 8)  Tranxene-T 7.5 Mg  Tabs (Clorazepate Dipotassium) .... Once Daily As Needed 9)  Synthroid 100 Mcg  Tabs (Levothyroxine Sodium) .... Once Daily 10)  Aspirin 81 Mg Tbec (Aspirin) .... Take One Tablet By Mouth Every Other Day 11)  Vitamin E 400 Unit Caps (Vitamin E) .... Take One Capsule By Mouth Daily 12)  Tikosyn 250 Mcg Caps (Dofetilide) .Marland Kitchen.. 1 Two Times A Day 13)  Tikosyn 125 Mcg Caps (Dofetilide) .... Take 1 Capsule By Mouth Two Times A Day 14)  Metoprolol Succinate 50 Mg Xr24h-Tab (Metoprolol Succinate) .... As Directed  Allergies (verified): 1)  ! Novocain 2)  ! Niacin 3)  ! * Statins 4)  ! Altace 5)  ! Codeine   ICD Specifications Following MD:  Sherryl Manges, MD     ICD Vendor:  [facility]     ICD Model Number:  [phone]     ICD Serial Number:  [id number] ICD DOI:  11/12/2005     ICD Implanting MD:  Sherryl Manges, MD  Lead 1:    Location: RA     DOI: 11/12/2005     Model #: [id number]     Serial #: [id number]     Status: active Lead 2:    Location: RV     DOI: 11/12/2005     Model #: [id number]     Serial #: [id number]     Status: active Lead 3:    Location: LV     DOI: 11/12/2005     Model #: [id number]     Serial #: [id number]     Status: active  Indications::  HCM,CHF   ICD Follow Up Battery Voltage:  2.59 V     Charge Time:  12.4 seconds     Underlying rhythm:  sb @ 45 ICD Dependent:  No       ICD  Device Measurements Atrium:  Amplitude: 2.2 mV, Impedance: 440 ohms, Threshold: 0.5 V at 0.5 msec Right Ventricle:  Amplitude: 12.0 mV, Impedance: 370 ohms, Threshold: 0.75 V at 0.5 msec Left Ventricle:  Impedance: 720 ohms, Threshold: 0.75 V at 0.5 msec Shock Impedance: 34 ohms   Episodes MS Episodes:  221      Percent Mode Switch:  <1%     Coumadin:  Yes Shock:  0     ATP:  0     Nonsustained:  0     Atrial Therapies:  0 Atrial Pacing:  96%     Ventricular Pacing:  >99%  Brady Parameters Mode DDDR     Lower Rate Limit:  70     Upper Rate Limit 115 PAV 200     Sensed AV Delay:  150  Tachy Zones VF:  231  VT:  200     VT1:  171     Next Cardiology Appt Due:  05/13/2010 Tech Comments:  221 AMS EPISODES---ALL LESS THAN 1 MINUTE.  NORMAL DEVICE FUNCTION.  NO CHANGES MADE. ROV IN 3 MTHS W/DEVICE CLINIC. Sandra [name]  February 13, 2010 3:37 PM

## 2010-02-28 ENCOUNTER — Telehealth (INDEPENDENT_AMBULATORY_CARE_PROVIDER_SITE_OTHER): Payer: Self-pay | Admitting: *Deleted

## 2010-03-04 ENCOUNTER — Telehealth: Payer: Self-pay | Admitting: Internal Medicine

## 2010-03-05 NOTE — Progress Notes (Signed)
Summary: meds   Phone Note From Pharmacy Call back at [phone]   [name]: [facility] Request: Needs authorization from insurer Summary of Call: [facility] needs more info re case to make decision  Initial call taken by: Roe Coombs,  February 11, 2010 12:28 PM  Follow-up for Phone Call        FORM FAXED TO CO TO GET AN APPROVAL IN 72 HOURS. Follow-up by: Scherrie Bateman, LPN,  February 25, 2010 2:15 PM

## 2010-03-05 NOTE — Progress Notes (Signed)
   Phone Note Outgoing Call   Call placed by: Scherrie Bateman, LPN,  February 28, 2010 11:16 AM Call placed to: Patient Summary of Call: PT AWARE TIKOSYN HAS BEEN APRROVED  THROUGH [employer] . Initial call taken by: Scherrie Bateman, LPN,  February 28, 2010 11:17 AM

## 2010-03-11 NOTE — Progress Notes (Signed)
Summary: pt has letter she wants to discuss   Phone Note Call from Patient Call back at Home Phone [phone] P PH     [name]: Patient Reason for Call: Talk to Nurse, Talk to Doctor Summary of Call: pt was trying to get the price of her tykosin lower and [employer] sent her a letter that she wants to go over with you Initial call taken by: Omer Jack,  March 04, 2010 1:39 PM  Follow-up for Phone Call        Phone Call Completed PT RECIEVED LETTER AS WELL FROM Susa Simmonds  RE APPROVING TIKOSYN Follow-up by: Scherrie Bateman, LPN,  March 04, 2010 5:16 PM

## 2010-03-19 ENCOUNTER — Ambulatory Visit (INDEPENDENT_AMBULATORY_CARE_PROVIDER_SITE_OTHER): Payer: Medicare Other | Admitting: Internal Medicine

## 2010-03-19 DIAGNOSIS — I4891 Unspecified atrial fibrillation: Secondary | ICD-10-CM

## 2010-03-19 LAB — POCT INR: INR: 2.8

## 2010-03-25 NOTE — Letter (Signed)
Summary: [facility]  [employer]   Imported By: Marylou Mccoy 03/19/2010 14:09:00  _____________________________________________________________________  External Attachment:    Type:   Image     Comment:   External Document

## 2010-03-25 NOTE — Letter (Signed)
Summary: [employer] - [employer]  [employer]   Imported By: Marylou Mccoy 03/18/2010 10:39:28  _____________________________________________________________________  External Attachment:    Type:   Image     Comment:   External Document

## 2010-03-28 ENCOUNTER — Encounter: Payer: Self-pay | Admitting: Internal Medicine

## 2010-03-31 ENCOUNTER — Ambulatory Visit (INDEPENDENT_AMBULATORY_CARE_PROVIDER_SITE_OTHER): Payer: Medicare Other | Admitting: Internal Medicine

## 2010-03-31 ENCOUNTER — Encounter: Payer: Self-pay | Admitting: Internal Medicine

## 2010-03-31 DIAGNOSIS — E039 Hypothyroidism, unspecified: Secondary | ICD-10-CM

## 2010-03-31 DIAGNOSIS — I4891 Unspecified atrial fibrillation: Secondary | ICD-10-CM

## 2010-03-31 DIAGNOSIS — F419 Anxiety disorder, unspecified: Secondary | ICD-10-CM | POA: Insufficient documentation

## 2010-03-31 DIAGNOSIS — F411 Generalized anxiety disorder: Secondary | ICD-10-CM

## 2010-03-31 DIAGNOSIS — N182 Chronic kidney disease, stage 2 (mild): Secondary | ICD-10-CM

## 2010-03-31 LAB — TSH: TSH: 0.08 u[IU]/mL — ABNORMAL LOW (ref 0.35–5.50)

## 2010-03-31 LAB — BASIC METABOLIC PANEL
GFR: 38.21 mL/min — ABNORMAL LOW (ref >60.00)
Glucose, Bld: 88 mg/dL (ref 70–99)
Potassium: 4.9 mEq/L (ref 3.5–5.1)
Sodium: 141 mEq/L (ref 135–145)

## 2010-03-31 NOTE — Assessment & Plan Note (Signed)
monitor creatinine on the 40 of lasix and appears volume stable No edema

## 2010-03-31 NOTE — Assessment & Plan Note (Signed)
Continue the tranxene

## 2010-03-31 NOTE — Progress Notes (Signed)
Subjective:    Patient ID: Sandra [name], female    DOB: 11/03/1932, 77 y.o.   MRN: [id number]  HPI   patient is a 77 year old white female with a history of atrial fibrillation secondary to hyperthrophic cardiomyopathy he presents for followup of her hypothyroidism her hypertension and her hyperlipidemia.  She also is following up anxiety we'll begin Tranxene 3.75 mg twice a day as needed for anxiety and she states that this has significantly reduced her sense of rapid heartbeats and anxiety.   her daughter has been extremely ill and been seen in a specialty [facility] in [facility] for her hypertrophic cardiomyopathy this has increased her stress as well as her husband being ill with cervical disc disease  Review of Systems  Constitutional: Negative for activity change, appetite change and fatigue.  HENT: Negative for ear pain, congestion, neck pain, postnasal drip and sinus pressure.   Eyes: Negative for redness and visual disturbance.  Respiratory: Negative for cough, shortness of breath and wheezing.   Gastrointestinal: Negative for abdominal pain and abdominal distention.  Genitourinary: Negative for dysuria, frequency and menstrual problem.  Musculoskeletal: Negative for myalgias, joint swelling and arthralgias.  Skin: Negative for rash and wound.  Neurological: Negative for dizziness, weakness and headaches.  Hematological: Negative for adenopathy. Does not bruise/bleed easily.  Psychiatric/Behavioral: Positive for agitation. Negative for sleep disturbance and decreased concentration. The patient is nervous/anxious.    Past Medical History  Diagnosis Date  . Atrial fibrillation   . Hyperlipidemia   . Hypothyroidism   . Bradycardia   . Cardiomyopathy, hypertrophic nonobstructive   . Anal fissure   . Adenomatous colon polyp   . Diverticulosis   . AICD (automatic cardioverter/defibrillator) present   . Systolic heart failure    Past Surgical History  Procedure  Date  . Cardioversion   . Cardiac defibrillator placement     reports that she has quit smoking. She does not have any smokeless tobacco history on file. She reports that she does not drink alcohol or use illicit drugs. family history includes Cancer in her father; Cardiomyopathy in her daughter; and Heart disease in her mother. Allergies  Allergen Reactions  . Codeine     REACTION: nausea  . Niacin     REACTION: rash  . Procaine Hcl     REACTION: smothers  . Ramipril     REACTION: unspecified  . Statins     REACTION: muscle aches      Objective:   Physical Exam  Constitutional: She is oriented to person, place, and time. She appears well-developed and well-nourished. No distress.  HENT:  Head: Normocephalic and atraumatic.  Right Ear: External ear normal.  Left Ear: External ear normal.  Nose: Nose normal.  Mouth/Throat: Oropharynx is clear and moist.  Eyes: Conjunctivae and EOM are normal. Pupils are equal, round, and reactive to light.  Neck: Normal range of motion. Neck supple. No JVD present. No tracheal deviation present. No thyromegaly present.  Cardiovascular: Normal rate, regular rhythm, normal heart sounds and intact distal pulses.   No murmur heard. Pulmonary/Chest: Effort normal and breath sounds normal. She has no wheezes. She exhibits no tenderness.  Abdominal: Soft. Bowel sounds are normal.  Musculoskeletal: Normal range of motion. She exhibits no edema and no tenderness.  Lymphadenopathy:    She has no cervical adenopathy.  Neurological: She is alert and oriented to person, place, and time. She has normal reflexes. No cranial nerve deficit.  Skin: Skin is warm and dry. She  is not diaphoretic.  Psychiatric: She has a normal mood and affect. Her behavior is normal.          Assessment & Plan:   the patient's atrial fibrillation has been controlled on Tikosyn the only problem she has is the cost of the drug.  She is tolerating the medicine well and  apparently is working well for her since she had her anxiety treated with the Tranxene she has done remarkably well and Ed reports that the frequency of her regular heartbeats has decreased significantly.   her blood pressure is well controlled she denies any chest pain shortness of breath PND orthopnea there is no need to change any of her medications at this time.   we'll monitor her renal function as well as a thyroid study today she still protime in 2 weeks last but then 2 weeks ago was in the range of protocol

## 2010-03-31 NOTE — Assessment & Plan Note (Signed)
Pt has the gene for HOCM monitering grandchildren

## 2010-03-31 NOTE — Assessment & Plan Note (Addendum)
She is in sinus rhythm  today and feels better on the nerve medications

## 2010-04-02 LAB — BASIC METABOLIC PANEL
BUN: 22 mg/dL (ref 6–23)
CO2: 26 mEq/L (ref 19–32)
CO2: 26 mEq/L (ref 19–32)
Calcium: 8.5 mg/dL (ref 8.4–10.5)
Calcium: 8.8 mg/dL (ref 8.4–10.5)
Chloride: 107 mEq/L (ref 96–112)
Chloride: 107 mEq/L (ref 96–112)
Creatinine, Ser: 1.1 mg/dL (ref 0.4–1.2)
Creatinine, Ser: 1.14 mg/dL (ref 0.4–1.2)
Creatinine, Ser: 1.17 mg/dL (ref 0.4–1.2)
GFR calc Af Amer: 55 mL/min — ABNORMAL LOW (ref >60)
GFR calc Af Amer: 59 mL/min — ABNORMAL LOW (ref >60)
GFR calc non Af Amer: 45 mL/min — ABNORMAL LOW (ref >60)
Glucose, Bld: 81 mg/dL (ref 70–99)

## 2010-04-02 LAB — PROTIME-INR
INR: 2.09 — ABNORMAL HIGH (ref 0.00–1.49)
INR: 2.23 — ABNORMAL HIGH (ref 0.00–1.49)
Prothrombin Time: 24.5 seconds — ABNORMAL HIGH (ref 11.6–15.2)

## 2010-04-08 ENCOUNTER — Telehealth: Payer: Self-pay | Admitting: *Deleted

## 2010-04-08 NOTE — Telephone Encounter (Signed)
LEFT MESSAGE FOR PT TO CALL BACK  NEEDS F/U APPT  PER DR Graciela Husbands TO  DISCUSS  SCREENING  OTHER FAM MEMBERS TO CHECK FOR GENE.

## 2010-04-11 NOTE — Telephone Encounter (Signed)
Spoke with pt this am has f/u appt with Dr Graciela Husbands on 05/14/10 at 3:00 pm will keep this appt.

## 2010-04-16 ENCOUNTER — Ambulatory Visit: Payer: Medicare Other

## 2010-04-16 DIAGNOSIS — I4891 Unspecified atrial fibrillation: Secondary | ICD-10-CM

## 2010-04-16 NOTE — Patient Instructions (Signed)
Alternating 2.5mg . And 5mg .

## 2010-04-17 LAB — BASIC METABOLIC PANEL
GFR calc Af Amer: 53 mL/min — ABNORMAL LOW (ref >60)
GFR calc non Af Amer: 44 mL/min — ABNORMAL LOW (ref >60)
Glucose, Bld: 89 mg/dL (ref 70–99)
Potassium: 4.1 mEq/L (ref 3.5–5.1)
Sodium: 141 mEq/L (ref 135–145)

## 2010-04-17 LAB — CBC
HCT: 37.9 % (ref 36.0–46.0)
Hemoglobin: 12.9 g/dL (ref 12.0–15.0)
RBC: 4.36 MIL/uL (ref 3.87–5.11)
RDW: 14.2 % (ref 11.5–15.5)

## 2010-04-24 LAB — DIFFERENTIAL
Lymphocytes Relative: 22 % (ref 12–46)
Monocytes Absolute: 0.5 10*3/uL (ref 0.1–1.0)
Monocytes Relative: 8 % (ref 3–12)
Neutro Abs: 4.1 10*3/uL (ref 1.7–7.7)
Neutrophils Relative %: 68 % (ref 43–77)

## 2010-04-24 LAB — BASIC METABOLIC PANEL
CO2: 25 mEq/L (ref 19–32)
Chloride: 108 mEq/L (ref 96–112)
GFR calc Af Amer: 59 mL/min — ABNORMAL LOW (ref >60)
Potassium: 4.1 mEq/L (ref 3.5–5.1)
Sodium: 138 mEq/L (ref 135–145)

## 2010-04-24 LAB — CBC
HCT: 36.3 % (ref 36.0–46.0)
Hemoglobin: 12 g/dL (ref 12.0–15.0)
MCHC: 33.2 g/dL (ref 30.0–36.0)
MCV: 86.7 fL (ref 78.0–100.0)
RBC: 4.19 MIL/uL (ref 3.87–5.11)

## 2010-04-24 LAB — PROTIME-INR
INR: 2 — ABNORMAL HIGH (ref 0.00–1.49)
Prothrombin Time: 23.5 seconds — ABNORMAL HIGH (ref 11.6–15.2)

## 2010-04-24 LAB — APTT: aPTT: 37 seconds (ref 24–37)

## 2010-05-13 ENCOUNTER — Other Ambulatory Visit: Payer: Self-pay | Admitting: Internal Medicine

## 2010-05-14 ENCOUNTER — Ambulatory Visit (INDEPENDENT_AMBULATORY_CARE_PROVIDER_SITE_OTHER): Payer: Medicare Other | Admitting: Internal Medicine

## 2010-05-14 ENCOUNTER — Encounter: Payer: Self-pay | Admitting: Internal Medicine

## 2010-05-14 DIAGNOSIS — Z95810 Presence of automatic (implantable) cardiac defibrillator: Secondary | ICD-10-CM

## 2010-05-14 DIAGNOSIS — I4891 Unspecified atrial fibrillation: Secondary | ICD-10-CM

## 2010-05-14 DIAGNOSIS — I421 Obstructive hypertrophic cardiomyopathy: Secondary | ICD-10-CM

## 2010-05-14 NOTE — Progress Notes (Signed)
HPI  Sandra [name] is a 77 y.o. female seen in followup for congestive heart failure in the setting of hypertrophic cardiomyopathy with depressed left ventricular function. She is status post CRT-D  implantation. She is on Coumadin. Because of recurrences of atrial fibrillation for the fall and winter, she is also a put on dofetilide in February. She recently underwent repeat cardioversion because of recurrence of atrial fibrillation; her Tikosyn dose at that time was increased from 250-375 twice daily. She has been holding sinus rhythm.  She has also undergone gene testing and was found to have the gene abnormal  We wiil plan to look [facility] family   .  Past Medical History  Diagnosis Date  . Atrial fibrillation   . Hyperlipidemia   . Hypothyroidism   . Bradycardia   . Cardiomyopathy, hypertrophic nonobstructive   . Anal fissure   . Adenomatous colon polyp   . Diverticulosis   . AICD (automatic cardioverter/defibrillator) present   . Systolic heart failure     Past Surgical History  Procedure Date  . Cardioversion   . Cardiac defibrillator placement     Current Outpatient Prescriptions  Medication Sig Dispense Refill  . aspirin 81 MG tablet Take 81 mg by mouth every other day.       . benazepril (LOTENSIN) 10 MG tablet Take by mouth Twice daily.      . calcipotriene (DOVONOX) 0.005 % cream Apply topically 2 (two) times daily.        . carvedilol (COREG) 12.5 MG tablet Take by mouth 2 (two) times daily with a meal.       . clorazepate (TRANXENE) 7.5 MG tablet        . dofetilide (TIKOSYN) 125 MCG capsule Take 125 mcg by mouth 2 (two) times daily.        Marland Kitchen ezetimibe (ZETIA) 10 MG tablet Take 10 mg by mouth daily.        . furosemide (LASIX) 20 MG tablet Take 20 mg by mouth 2 (two) times daily.        . hydrocortisone (ANUSOL-HC) 2.5 % rectal cream Place 1 application rectally 2 (two) times daily.        Marland Kitchen levothyroxine (SYNTHROID, LEVOTHROID) 100 MCG tablet Take  100 mcg by mouth daily.        . metoprolol (TOPROL-XL) 100 MG 24 hr tablet Take 100 mg by mouth as needed.       Marland Kitchen TIKOSYN 250 MCG capsule Take 250 mcg by mouth Twice daily.      . vitamin E 400 UNIT capsule Take 400 Units by mouth daily.        Marland Kitchen warfarin (COUMADIN) 5 MG tablet Take 5 mg by mouth daily.        Marland Kitchen DISCONTD: clorazepate (TRANXENE) 7.5 MG tablet TAKE ONE TABLET BY MOUTH EVERY DAY AS NEEDED  30 tablet  5    Allergies  Allergen Reactions  . Codeine     REACTION: nausea  . Niacin     REACTION: rash  . Procaine Hcl     REACTION: smothers  . Ramipril     REACTION: unspecified  . Statins     REACTION: muscle aches    Review of Systems negative except from HPI and PMH  Physical Exam Well developed and well nourished in no acute distress HENT normal E scleral and icterus clear Neck Supple JVP flat; carotids brisk and full Clear to ausculation Regular rate and rhythm, no murmurs  gallops or rub Soft with active bowel sounds No clubbing cyanosis and edema Alert and oriented, grossly normal motor and sensory function Skin Warm and Dry  ECG av paing  Assessment and  Plan

## 2010-05-14 NOTE — Assessment & Plan Note (Signed)
Infrequent atrial fibrillation. Potassium and magnesium were within normal limits over the last 3 months. We will discontinue her aspirin as she is on aspirin.

## 2010-05-14 NOTE — Assessment & Plan Note (Signed)
Will od genetic testing on the [facility]

## 2010-05-14 NOTE — Patient Instructions (Signed)
Your physician has recommended you make the following change in your medication: STOP Aspirin.  Your physician wants you to follow-up in: 6 months. You will receive a reminder letter in the mail two months in advance. If you don't receive a letter, please call our office to schedule the follow-up appointment.

## 2010-05-15 DIAGNOSIS — Z95810 Presence of automatic (implantable) cardiac defibrillator: Secondary | ICD-10-CM | POA: Insufficient documentation

## 2010-05-15 NOTE — Assessment & Plan Note (Signed)
The patient's device was interrogated.  The information was reviewed. No changes were made in the programming.    

## 2010-05-16 ENCOUNTER — Telehealth: Payer: Self-pay | Admitting: Internal Medicine

## 2010-05-16 ENCOUNTER — Ambulatory Visit (INDEPENDENT_AMBULATORY_CARE_PROVIDER_SITE_OTHER): Payer: Medicare Other | Admitting: Internal Medicine

## 2010-05-16 DIAGNOSIS — I4891 Unspecified atrial fibrillation: Secondary | ICD-10-CM

## 2010-05-16 LAB — POCT INR: INR: 2.4

## 2010-05-16 NOTE — Patient Instructions (Signed)
Alternate 2.5mg  and 5 mg alternative

## 2010-05-16 NOTE — Telephone Encounter (Signed)
Pt was in for visit and was told her relatives need to have genetic testing due to pt being diagnosed with something she's not really sure, has questions becky reirerson [phone]

## 2010-05-16 NOTE — Telephone Encounter (Signed)
Pts' 1st cousin calls today regarding genetic testing for HOCM.  His mother and the pts' mother were sisters.  He does not know if he needs to be tested or not.  I told his wife that it was usually immediate family but, that I did not know which members Dr. Graciela Husbands wished to be tested.  I will forward this to Dr. Graciela Husbands and his nurse Herbert Seta. Mylo Red RN

## 2010-05-21 ENCOUNTER — Other Ambulatory Visit: Payer: Self-pay | Admitting: *Deleted

## 2010-05-21 MED ORDER — DOFETILIDE 125 MCG PO CAPS: 125.0000 ug | ORAL_CAPSULE | Freq: Two times a day (BID) | ORAL | Status: DC

## 2010-05-27 NOTE — Assessment & Plan Note (Signed)
[facility]                         ELECTROPHYSIOLOGY OFFICE NOTE   BLASA, RAISCH                     MRN:          [id number]  DATE:03/07/2007                            DOB:          [birth year]    HISTORY OF PRESENT ILLNESS:  Ms. Sarvis is seen because of concerns  about dislocation and displacement inferiorly of her ICD. It seems to be  stable at this point. it is about an inch and a half below the  incisional line and it makes me wonder whether the retaining suture did  not come loose. We plan to watch it for right now and reassess.     Duke Salvia, MD, [facility]  Electronically Signed    SCK/MedQ  DD: 03/07/2007  DT: 03/07/2007  Job #: [id number]

## 2010-05-27 NOTE — Assessment & Plan Note (Signed)
[facility]                         ELECTROPHYSIOLOGY OFFICE NOTE   Sandra [name], Sandra [name]                     MRN:          [id number]  DATE:08/24/2006                            DOB:          12-05-1932    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  She still thinks it may be related to [place]  exposure.   Her functional status remains relatively stable.  She is short of breath  on inclines and when it is quite hot.  Otherwise, she seems to be  tolerating most of her ADLs with only modest fatigue.   CURRENT MEDICATIONS INCLUDE:  1. Coreg 12.5 b.i.d., previously having been down-titrated from 25      because of fatigue and bradycardia.  2. Lotensin 10 b.i.d.  3. Synthroid 100.  4. Zetia.  5. Tranxene.  6. Coumadin.   EXAMINATION:  Her blood pressure is 125/86, her pulse is 68.  Her lungs were clear.  Heart sounds were regular.  The extremities were without edema.  Her pocket was well-healed.  There was some concern about some erythema  at the inferior aspect of it.  I did not appreciate that today.  She is  also concerned that maybe the device has moved again.   IMPRESSION:  1. Hypertrophic cardiomyopathy.  2. Depressed left ventricular function.  3. Status post CRT-ICD for the above.  4. Question peri-device erythema.   [name] is doing very well symptomatically.  Her concern about her  device is somewhat alarming; we will need to keep a close eye on this.   As related to her cardiomyopathy, we will try to re-up-titrate her beta  blocker.  I have increased her today from 12.5 b.i.d. to 12.5/18.75.  When she comes back for her device clinic followup in three months, if  she is tolerating that, I will ask them to increase her to 18.75 b.i.d.  I will see her again in six months' time, at which point we will try to  further up-titrate her Coreg.    Duke Salvia, MD, [facility]  Electronically Signed   SCK/MedQ  DD: 08/24/2006  DT: 08/25/2006  Job #: [id number]

## 2010-05-27 NOTE — Assessment & Plan Note (Signed)
[facility]                         ELECTROPHYSIOLOGY OFFICE NOTE   EPPIE, BARHORST                     MRN:          [id number]  DATE:12/28/2006                            DOB:          Nov 22, 1932    Sandra [name] comes in complaining of PVCs.   On interrogation of her device what was interesting was that she would  have PVC when she was LV pacing primarily and they would be less evident  when she was either RV pacing or right-atrial pacing.  She has prolonged  AV delay with the PR interval about 300 milliseconds.   I am not quite sure why she would be having these LV pacing-induced PVC.  Proarrhythmia has certainly been identified.  This would be a different  manifestation of it than I have seen.   We will plan to check the potassium and magnesium today and see how she  does.  Given her improvement in functional capacity, drug suppression of  her PVC may be the better order of business than to allow her to RV  apically pace or to right atrially pace.     Duke Salvia, MD, [facility]  Electronically Signed    SCK/MedQ  DD: 12/29/2006  DT: 12/29/2006  Job #: [id number]

## 2010-05-27 NOTE — Assessment & Plan Note (Signed)
[facility]                         ELECTROPHYSIOLOGY OFFICE NOTE   Sandra [name], Sandra [name]                     MRN:          [id number]  DATE:09/30/2007                            DOB:          1932/10/21    Sandra [name] comes in today because of pain on her left side which she  wonders whether it is associated with her defibrillator  resynchronization lead as well as concerns that her defibrillator  generator has fallen down again following the revision where we tied  it up.  She, unfortunately, had a fall a few weeks after the procedure  and on inspection, the device seems to be about 3 cm below the incision  line which is where the anchoring suture was.  This suggesting that the  anchoring suture was avulsed.  This may have occurred with the fall.   In any case, she is off on her trip, we will plan to see her again in  October as previously scheduled and she will need to make a decision as  to whether she wants it revised once again, realizing the risks of  infection.     Duke Salvia, MD, [facility]  Electronically Signed    SCK/MedQ  DD: 09/30/2007  DT: 09/30/2007  Job #: [id number]

## 2010-05-27 NOTE — Consult Note (Signed)
Sandra [name], RAKER NO.:  [id number]   MEDICAL RECORD NO.:  [id number]          PATIENT TYPE:  EMS   LOCATION:  MAJO                         FACILITY:  [facility]   PHYSICIAN:  Bettey Mare. Lawrence, NPDATE OF BIRTH:  1932-08-12   DATE OF CONSULTATION:  DATE OF DISCHARGE:                                 CONSULTATION   REASON FOR CONSULTATION:  Chest pain.   PRIMARY CARDIOLOGIST:  Formerly, Dr. Madaline Savage.   ELECTROPHYSIOLOGIST:  Dr. Duke Salvia.   PRIMARY CARE PHYSICIAN:  Dr. Lovell Sheehan.   HISTORY OF PRESENT ILLNESS:  A 77 year old Caucasian female with known  history of atrial fibrillation, hypertrophic cardiomyopathy  nonobstructive, ICD, pacemaker placed by Dr. Graciela Husbands in 2007, history of  pulmonary hypertension with complaints of mild shortness of breath,  which occurred last evening.  She took an extra dose of Lasix, urinated,  and felt better.  This morning she awoke with left-sided chest pain,  aching, worse with movement under the left breast radiating down to the  left side.  It does not hurt to take deep breath.  She said it has been  aching since about 7:30 this morning, constant since then.  The patient  called Dr. Odessa Fleming nurse to report the symptoms and the patient was  advised to come to the emergency room.  The patient is normally followed  by Dr. Lovell Sheehan [facility] and was seen by him approximately 4  days ago with therapeutic INR.  The pacemaker was interrogated down here  in the ER and found to be functioning appropriately.   REVIEW OF SYSTEMS:  Positive for left under the breast pain and  arthralgia.  The patient also has a history of fever blisters on the  lips within the last week.  All other systems are reviewed and found to  be negative.   PAST MEDICAL HISTORY:  1. Atrial fibrillation.  2. Hypertrophic cardiomyopathy with no outflow tract obstruction.  3. ICD placement in November 2007 with revision of the pocket  secondary to hematoma shortly thereafter.  4. She has chronic class II-III CHF.  5. 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  She      has been treated for hypothyroidism, and is currently on      medication.  6. Bradycardia, on beta-blockers, but now has pacemaker, and is on      Coreg.  7. The patient had a cardiac catheterization by Dr. Elsie Lincoln revealing      normal coronaries in 2002.  She had a repeat catheterization in      2004 with a right heart cath revealing severe pulmonary      hypertension with pressure of 60/27 mmHg.   PAST SURGICAL HISTORY:  ICD in November 2007 with revision shortly  thereafter secondary to pocket hematoma.  She also has a history of  hysterectomy and ovarian tumor, removal of the tumor.   SOCIAL HISTORY:  She lives in [place] with her husband.  She quit 20  years ago smoking.  No use of EtOH or drugs.   FAMILY HISTORY:  Mother deceased  with Alzheimer's, CAD, and pacemaker.  The patient's father deceased from complications of COPD.  She has 1  brother and no health problems.   CURRENT MEDICATIONS:  1. Coreg 12.5 mg p.o. b.i.d.  2. Lotensin 10 mg b.i.d.  3. Synthroid 100 mg daily.  4. Aspirin 81 mg daily.  5. Lasix 20 mg daily.  6. Coumadin 5 mg 4 days a week alternating with 2.5 mg.  7. Zetia 10 mg daily.   ALLERGIES:  NOVOCAIN and CODEINE.   PHYSICAL EXAMINATION:  VITAL SIGNS:  Blood pressure 154/108, pulse 73,  respirations 16, temperature 98, O2 sat 100% on room air.  GENERAL:  She is awake, alert, oriented, very talkative.  HEENT:  Head is normocephalic and atraumatic.  Eyes:  PERRLA.  Mucous  membranes and mouth pink and moist.  Tongue is midline.  NECK:  Supple without JVD.  No carotid bruits.  CARDIOVASCULAR:  Regular rate and rhythm without murmurs, rubs, or  gallops.  Pulses are 2+ and equal without bruits.  LUNGS:  Clear to auscultation.  SKIN:  There is redness and tenderness under the left breast.  There  are  no lesions seen.  Palpation causes pain.  ABDOMEN:  Soft, nontender with 2+ bowel sounds.  EXTREMITIES:  Without clubbing, cyanosis, or edema.  NEURO:  Cranial nerves II through XII are grossly intact.   IMPRESSION:  1. Atypical chest pain.  2. Known history of nonischemic hypertrophic cardiomyopathy,      nonobstructive.  3. Status post implantable cardioverter-defibrillator pacemaker.  4. History of severe pulmonary hypertension.   PLAN:  This pain does not appear to be cardiac in origin.  We will order  1 set of enzymes, CBC, BMET, PT/INR.  She may be having very early  shingles possibly.  There are some redness, but no definite vesicle is  seen.  We will treat empirically for herpes zoster using Valtrex 1 g x10  days and give Darvocet for pain control.  If enzymes and other labs come  back within normal limits, the patient will be discharged home with  followup to Dr. Lovell Sheehan.      Bettey Mare. Lyman Bishop, NP     KML/MEDQ  D:  03/26/2008  T:  03/27/2008  Job:  [id number]   cc:   Corwin Levins, MD

## 2010-05-27 NOTE — Assessment & Plan Note (Signed)
[facility]                         ELECTROPHYSIOLOGY OFFICE NOTE   DELLANIRA, DILLOW                     MRN:          [id number]  DATE:11/03/2007                            DOB:          06-25-1932    HISTORY:  Ms. Brandi was seen in followup for an ICD implanted for  congestive heart failure in the setting of dilated hypertrophic  cardiomyopathy.  She is doing well.  She took a trip to [facility] and that went very smoothly.   The device that has fallen (again) is stable.  She saw Dr. Jamey Ripa who  felt that it would not be an issue for her breast.  Her functional  status remains stable.   MEDICATIONS:  Her medications were reviewed.  Her Coreg dose was 12.5  twice a day having been down titrated from 12.5/18.75.  She is also on  Lotensin 10, Synthroid, aspirin, Coumadin, and Lasix.   PHYSICAL EXAMINATION:  VITAL SIGNS:  Her blood pressure is 112/80.  Her  pulse is 73.  Her weight was 152 which is stable.  LUNGS:  Clear.  CARDIAC:  Her heart sounds were regular.  EXTREMITIES:  Without edema.   Interrogation of her St. Jude ICD demonstrated stable impedances and  there are no intercurrent episodes.   Electrocardiogram demonstrated AV pacing.   IMPRESSION:  1. Congestive heart failure - chronic - systolic.  2. Hypertrophic cardiomyopathy.  3. Status post implantable cardioverter-defibrillator for the above.   PLAN:  Ms. Bagg was stable.  We will see her again in 3 months' time  in the Device Clinic. SHe would prefer presently not to do anything  about the device     Duke Salvia, MD, [facility]  Electronically Signed    SCK/MedQ  DD: 11/03/2007  DT: 11/03/2007  Job #: [id number]

## 2010-05-27 NOTE — Assessment & Plan Note (Signed)
[facility]                         ELECTROPHYSIOLOGY OFFICE NOTE   Sandra [name], Sandra [name]                     MRN:          [id number]  DATE:02/14/2007                            DOB:          10-21-1932    [name] comes in complaining that her device has fallen down again.  About a year ago, we made a small incision, pulled the device up and  anchored it. It is her impression that it is falling again now instead  of into her axilla into her breast.  There is some atrophy at the site  of the incision.  I do not have recollection as to exactly were the  device was left. It is about a centimeter below the incision. It can be  pushed into the incision from below and when she puts her bra on, the  device rests more cephalad.  Will have to see whether this is going to turn out to be a real  phenomenon or not and will plan to look at her again in about 2-3 weeks.     Duke Salvia, MD, [facility]  Electronically Signed    SCK/MedQ  DD: 02/14/2007  DT: 02/14/2007  Job #: [id number]

## 2010-05-27 NOTE — Assessment & Plan Note (Signed)
[facility]                         ELECTROPHYSIOLOGY OFFICE NOTE   Sandra [name], Sandra [name]                     MRN:          [id number]  DATE:07/14/2007                            DOB:          06-10-1932    Sandra [name] comes in having felt like she went out of rhythm on Saturday.  This was associated with exercise intolerance and chest discomfort.  Interrogation of her device confirms that since she remains at rhythm  she has mostly and her heart rate is improved.   She has had atrial fibrillation before and she is on Coumadin.  We will  need to check the adequacy of her anticoagulation.   Her medications otherwise include Lotensin 10 b.i.d., Synthroid 100 mcg,  aspirin, Zetia, Coumadin, and Coreg at 12.5 as well as Lasix 20.   On examination today, her blood pressure was 150/90 and pulse was 72.  Lungs were clear.  Neck veins were flat.  Heart sounds were regular with  a 2/6 murmur.  The extremities had trace edema.   Interrogation of her defibrillator demonstrates the aforementioned  atrial fibrillation results.   IMPRESSION:  1. Atrial fibrillation - persistent.  2. Worsening heart failure associated with atrial fibrillation.  3. Hypertrophic cardiomyopathy with depressed left ventricular      function.  4. Underlying right bundle-branch block.  5. Caudal migration of her defibrillator.   Sandra [name] is asymptomatic with her atrial fibrillation.  We will check  her INR and plan to proceed with cardioversion next week.  She is also  continuing to complain of further caudal migration of her defibrillator,  so we will do it at the same time, revise her device, and tack up the  defibrillator into the subclavicular region.   She understands potential benefits as well as potential risks of  infection.     Duke Salvia, MD, [facility]  Electronically Signed    SCK/MedQ  DD: 07/14/2007  DT: 07/15/2007  Job #: [id number]

## 2010-05-27 NOTE — Assessment & Plan Note (Signed)
[facility]                         ELECTROPHYSIOLOGY OFFICE NOTE   Sandra [name], Sandra [name]                     MRN:          [id number]  DATE:11/22/2006                            DOB:          19-Feb-1932    Sandra [name] was seen on November 22, 2006 for followup of her St. Jude  Atlas ICD implanted on November 12, 2005 for hypertrophic cardiomyopathy.  Interrogation of her device demonstrates P-waves of 2.8 millivolts  with  an atrial impedance  of 490 ohms with a threshold of 0.5 volts at 0.5  Msec.  Her R-waves measure 11.9 millivolts  with an RV impedance  of 395  ohms and a threshold of 0.75 volts at 0.5 Msec.  Her LV impedance  was  700 ohms with a threshold of 0.5 volts at 0.5 Msec.  Her battery voltage  was 3.2 volts with a charge time of 10.3 seconds.  She had had no  episodes of any ventricular arrhythmias since last interrogation.  She  had had one mode switch episode which lasted 10 seconds.  She is  currently A-pacing 59% of the time, V-pacing greater than 99% of the  time.  She will return to the clinic in 3 months' time for reevaluation.      Gypsy Balsam, RN,BSN  Electronically Signed      Duke Salvia, MD, [facility]  Electronically Signed   AS/MedQ  DD: 11/22/2006  DT: 11/22/2006  Job #: [id number]

## 2010-05-30 NOTE — Discharge Summary (Signed)
NAMELAVENA, [name] NO.:  [id number]   MEDICAL RECORD NO.:  [id number]          PATIENT TYPE:  INP   LOCATION:  3703                         FACILITY:  [facility]   PHYSICIAN:  Duke Salvia, MD, FACCDATE OF BIRTH:  07/16/1932   DATE OF ADMISSION:  11/12/2005  DATE OF DISCHARGE:  11/13/2005                                 DISCHARGE SUMMARY   ALLERGIES:  NOVOCAIN, NIACIN, ALTACE AND STATINS.   DISCHARGE DIAGNOSES:  1. Discharging day #1, status post implant St. Jude Atlas + HF BiV      cardioverter defibrillator.  2. Hypertrophic cardiomyopathy, no significant outflow tract obstruction.  3. Nonischemic cardiomyopathy, ejection fraction 15-25% at left heart      catheterization in July 2006.  4. Congestive heart failure, chronic, systolic (Class II-III [place]      [employer]).  5. Atrial fibrillation on chronic Coumadin.  6. Left heart catheterization in July 2006, normal coronary anatomy, 2+      mitral regurgitation.  7. Right bundle branch block, first-degree atrioventricular bock.  8. Bradycardia on beta-blockers.  9. Family history of coronary artery disease.   PROCEDURE:  On November 12, 2005, implantation of St. Jude Atlas + HF  cardioverter defibrillator with resynchronization lead to the left  ventricle.  There was a defibrillator threshold study less than or equal to  15 joules, Dr. Sherryl Manges.   HISTORY OF PRESENT ILLNESS:  Sandra [name] is a 77 year old female who has a  long-term history of hypertrophic cardiomyopathy.  She has never had any  significant outflow tract obstruction.  She was diagnosed with atrial  fibrillation and rapid ventricular rates in June 2006.  This has proved  difficult to rate control.  In July, she had catheterization to determine if  there were an ischemic etiology to her atrial fibrillation.  This study  showed that the coronary anatomy was normal with ejection fraction, however,  severely depressed at  15-25%.   She had 2+ mitral regurgitation.  Her  congestive heart failure symptoms are rated as Class II-III, chronic  systolic.  She has right bundle branch block and first-degree AV block.   The patient is unable to tolerate up titration of beta-blockers at present  secondary to a tendency to bradycardia.  She presents for BiV ICD on  November 1.  She is had no intercurrent symptoms since an office with Dr.  Graciela Husbands, November 03, 2005.  She is not complaining of chest pain, dyspnea,  fever, chills.  She has no history of presyncope or syncope.   HOSPITAL COURSE:  The patient presented electively November 1.  She  underwent successful implantation of cardioverter defibrillator with  resynchronization lead to the left ventricle.  She did have significant  ecchymoses, swelling and soreness at the cardioverter defibrillator pocket  which has extended to the left axilla and to the lateral aspect of the left  breast.  The patient says it is very sore to touch and very sore to  movement.  On the morning of postprocedure day #1, she did have nausea and  dry heaves which is as  slowly getting better.  She also had right frontal  headache which sometimes occurs and is left over from more severe menstrual  migraines.  Once again, she has severe pain and tenderness at the left arm.  The plan for these palliations will be to give Advil for the headache,  Ultram for the left arm and shoulder pain and Zofran for the nausea.  On  postprocedure day #1, she is alert and oriented x3 despite these  afflictions.  Her oxygen saturation is 98% room air with blood pressure  123/83.  She is currently AV-paced.  Her INR is 2.1.  She is discharging  possibly today.  She tells me she is on a research study and will be  followed up in the future.  We ask that she continue to have her blood  thinner checked at Dr. Lovell Sheehan office.   ACTIVITY:  She is asked not to drive x1 week not to lift anything heavier  than 10 pounds x4  weeks.   WOUND CARE:  She is to keep her incision dry for the next 7 days and to  sponge bathe until Thursday, November 8.   DISCHARGE MEDICATIONS:  1. Coumadin 5 mg daily except 2.5 mg on Monday and Wednesday.  2. Zetia 10 mg daily.  3. Synthroid 88 mcg daily.  4. Lotensin 10 mg twice daily.  5. Coreg 12.5 mg twice daily.  This is a new dose.  Previously, she was on      12.5 in the morning and 6.25 in the evening.  6. Furosemide 20 mg daily.  7. Enteric-coated aspirin 81 mg daily.  8. Vitamin E 400 international units daily.   FOLLOW UP:  She has followup with [facility] at [address]  ICD clinic on Wednesday, November 14, at 9 a.m.  She will have a  BMET at this time.  She will see Dr. Graciela Husbands in follow up on Thursday,  December 6, at 1:45 p.m.  The patient advises me that Dr. Graciela Husbands is now her  primary cardiologist and she will see Dr. Graciela Husbands on March 23, 2006, at noon.   LABORATORY DATA AND X-RAY FINDINGS:  Labs pertinent to this admission reveal  sodium 139, potassium 5, chloride 108, carbonate 23, glucose is 92, BUN is  26, creatinine 1.4. The pro time on November 1, 23.6, INR is 2.0.  Complete  blood count with white cells 6.4, hemoglobin 13.6, hematocrit 40.2 and  platelets 168.   This is a 35 minute dictation.      Maple Mirza, PA    ______________________________  Duke Salvia, MD, [facility]    GM/MEDQ  D:  11/13/2005  T:  11/13/2005  Job:  [id number]   cc:   [name], MD

## 2010-05-30 NOTE — Discharge Summary (Signed)
[place]  Patient:    Sandra [name], Sandra [name] Visit Number: [id number] MRN: [id number]          Service Type: MED Location: 2000 2004 01 Attending Physician:  Berry, Jonathan [place] Dictated by:   Raymon Mutton, P.A. Admit Date:  05/29/2001 Discharge Date: 05/30/2001   CC:         Chanda Busing, M.D.   Discharge Summary  DATE OF BIRTH: September 02, 1932  DISCHARGE DIAGNOSES: 1. Throat pain which radiated to the left shoulder, etiology unclear. 2. Idiopathic hypertrophic subaortic stenosis. 3. Hypertension. 4. Hypothyroidism. 5. Nonischemic cardiomyopathy with ejection fraction of 35%. 6. Hyperlipidemia.  HISTORY OF PRESENT ILLNESS: [name] is a 77 year old Caucasian woman with history of idiopathic hypertrophic subaortic stenosis with no obstruction and hyperlipidemia and nonischemic cardiomyopathy who presented to the ER yesterday in the afternoon after she experienced an episode of throat pain around 3:00 oclock during the day, radiating to the left shoulder. She said that her blood pressure was also increased at home around 195 systolic and heart rate was really fast. She was delivered to the ER and initial telemetry tracing showed heart rate of 122. Then EKG showed heart rate of 50.  PROCEDURES: None.  CONSULTATIONS: None.  COMPLICATIONS: None.  HOSPITAL COURSE: The patient was admitted to the telemetry unit and was started on aspirin and the dose of Halotensin was decreased from 20 mg to 10 mg. Cardiac panel was ordered to exclude any possibilities of ischemia causing this arrhythmia. Telemetry tracing showed sinus bradycardia after admission with heart rate between 45 and 60. She remained afebrile and the next morning, was stable. Did not have any complaints of chest pain. No shortness of breath. No palpitations or flutters.  DIAGNOSTIC STUDIES: EKG showed right bundle branch block with sinus bradycardia and no specific ST  changes.  LABORATORY DATA: Sodium 143, potassium 4.4, creatinine 1.2, BUN 20, glucose 92, CO2 27, chloride 111. Cardiac panel revealed CK of 128, CKMB 5.8, and troponin 0.07. Second set showed CK of 121, CKMB 8.0, and troponin of 0.10. Third set showed CK of 91, CKMB of 6.0, and troponin was unavailable at the time of dictation. TSH was 0.454. CBC showed white blood cell count 7.9, hemoglobin 12.7, hematocrit 38, and platelets 162.  DISPOSITION: Dr. Elsie Lincoln had seen and examined the patient and was found to be stable from a cardiac standpoint. The patient reported that she just had a 2-D echo done last Wednesday but the results were unavailable to me, so we decided to discharge the patient and follow-up with outpatient Cardiolite to assess the possibility of myocardial perfusion abnormalities and review the 2-D echo, when she will be seen next time in the office in two weeks on June 14, 2001 by Dr. Elsie Lincoln. Dictated by:   Raymon Mutton, P.A. Attending Physician:  Berry, Jonathan [place] DD:  05/30/01 TD:  06/01/01 Job: [id number]

## 2010-05-30 NOTE — Cardiovascular Report (Signed)
[place]  Patient:    Sandra [name], Sandra [name] Visit Number: [id number] MRN: [id number]          Service Type: CAT Location: [facility] 2854 01 Attending Physician:  Ophelia Shoulder Dictated by:   Madaline Savage, M.D. Proc. Date: 10/26/00 Admit Date:  10/26/2000   CC:         Nathen May, M.D. [facility]  Cardiac Catheterization Laboratory   Cardiac Catheterization  3PROCEDURES PERFORMED: 1. Selective coronary angiography by Judkins technique. 2. Retrograde left heart catheterization. 3. Left ventricular angiography in two views.  COMPLICATIONS: None.  ENTRY SITE: Right femoral.  DYE USED: Omnipaque.  PATIENT PROFILE: The patient is a 77 year old woman with known left ventricular hypertrophy without obstruction, who has a strong family history of hypotrophic cardiomyopathy with obstruction.  As previously stated, the patient has no demonstrable obstruction. A recent echocardiogram performed at [facility] showed 1+ mitral regurgitation.  The left ventricle was noted to be hypertrophied. It was suspected the qualitative ejection fraction was 50-55% and there was inferoseptal and inferior hypokinesis.  There was said to be myofibril disarray consistent with hypotrophic cardiomyopathy and wall motion abnormalities since May 1998, were said to be new. A Persantine Cardiolite stress test was performed September 27, 2000, at [facility] showing an ejection fraction of 45% with anterior and anteroseptal nontransmural scar and mid to distal anterior ischemia.  Cardiac catheterization was scheduled today electively. It should be noted that the patient had a previous cardiac catheterization April 05, 1998, when her left ventricle was also hypertrophic. The LVEDP then was also 44 and there was no aortic valve gradient. Todays outpatient procedure was performed electively. The patient demonstrated no  allergic response to lidocaine subcutaneously.  RESULTS:  PRESSURES: The left ventricular pressure today was 160/40, central aortic pressure 160/80 with a mean of 110 and no aortic valve gradient was noted.  ANGIOGRAPHIC RESULTS: The left main coronary artery system was dominant. There were no obstructive lesions. The right coronary artery was small and nondominant.  The left ventricle observated in two views showed global hypokinesis, 1+ mitral regurgitation, very thick left ventricular walls and no evidence of thrombus. Ejection fraction was calculated at 33%.  FINAL DIAGNOSES: 1. Hypertropic cardiomyopathy without obstruction with depressed ejection    fraction 33% and high left ventricular end-diastolic pressure of 40. 2. Angiographic patent coronary arteries, left dominant system.  PLAN: The patient should be maintained on an ACE inhibitor which she is currently on. A beta blocker will be added to her regimen in consideration for long-acting nitrates will also be begun. Dictated by:   Madaline Savage, M.D. Attending Physician:  Ophelia Shoulder DD:  10/26/00 TD:  10/26/00 Job: [id number]

## 2010-05-30 NOTE — Assessment & Plan Note (Signed)
[facility]                         ELECTROPHYSIOLOGY OFFICE NOTE   Sandra [name], Sandra [name]                     MRN:          [id number]  DATE:01/15/2006                            DOB:          [birth year]    Sandra [name] comes in today because of a concern about her wound.  We had  actually opened it up on Christmas Eve, found a piece of retained suture  and put some interrupted sutures.  It looks much better today.  The  interrupted sutures were removed.   She is to call us next week and let us know how she is doing.     Duke Salvia, MD, [facility]  Electronically Signed    SCK/MedQ  DD: 01/15/2006  DT: 01/15/2006  Job #: [id number]

## 2010-05-30 NOTE — H&P (Signed)
Sandra [name], Sandra [name] NO.:  [id number]   MEDICAL RECORD NO.:  [id number]          PATIENT TYPE:  INP   LOCATION:  2899                         FACILITY:  [facility]   PHYSICIAN:  Maple Mirza, PA   DATE OF BIRTH:  1932/12/23   DATE OF ADMISSION:  11/12/2005  DATE OF DISCHARGE:                                HISTORY & PHYSICAL   ALLERGIES:  1. Novocain.  2. Niacin.  3. Altace.  4. Intolerance to statins.   CONSULTANTS:  Dr. Elsie Lincoln, cardiologist.  Dr. Sherryl Manges, electrophysiologist.  Dr. Darryll Capers, primary caregiver.   PRESENTING CIRCUMSTANCE:  I am here for procedure.   HISTORY OF PRESENT ILLNESS:  Sandra [name] is a 77 year old female with a long-  term history of hypertrophic cardiomyopathy.  She has never had any  significant outflow tract obstruction, however.   She was diagnosed with atrial fibrillation and rapid ventricular rates in  June 2006.  This proved to be difficult due to rate control.  She had a  catheterization at that time, in July of 2006, to determine if the AFib had  an ischemic ideology.  The coronary anatomy turned out to be normal.  Ejection fraction 15% to 25%, with 2+ mitral regurgitation.   Patient has congestive heart failure symptoms rated at the New York Heart  Association class II-III as chronic systolic CHF.  She has right bundle  branch block by EKG and first-degree AV block.  Patient is unable to  tolerate a titration of beta blocker secondary to a tendency to bradycardia.   Patient presents for BiV ICD.  She has no intercurrent symptoms since office  visit at the [facility] on November 03, 2005.  She is not complaining  of chest pain, dyspnea, fever, chills, presyncope, or syncope.   MEDICATIONS:  Include:  1. Zetia 10 mg daily.  2. Synthroid 88 mcg daily.  3. Lotensin 10 mg b.i.d.  4. Coreg 12.5 mg 1/2 tab b.i.d.  5. Furosemide 20 mg daily.  6. Aspirin 81 mg daily.  7. Coumadin.  8. Vitamin E 400 IU  daily.   Of note, her INR is 2.0 this morning.   PAST MEDICAL HISTORY:  1. Nonischemic cardiomyopathy.  Ejection fraction of 15% to 25% in      __________  7/06.  2. Dyslipidemia.  3. Hypertrophic cardiomyopathy.  No significant outflow tract obstruction.  4. CHF.  Chronic systolic II-III.  5. Atrial fibrillation, rapid ventricular rate.  Difficult to rate      control; diagnosed June 2006.  6. Catheterization 7/06.  Coronary anatomy normal, 2+ mitral      regurgitation.  7. Treated hypothyroidism.  8. Right bundle branch block, first-degree AV block.  9. Bradycardia on beta blockers.  10.Family history of coronary artery disease.   PHYSICAL EXAMINATION:  VITAL SIGNS:  Temperature is 97.5.  Blood pressure  137/85.  Pulse is 80 and regular.  Respirations is 20.  Oxygen saturation  97% on room air.  GENERAL:  This patient is alert and oriented x3, in no acute distress.  Patient recently had superficial lesions on  the left breast and left flank  removed.  HEENT:  Eyes:  Pupils equal, round and reactive to light.  Extraocular  movements are intact.  Sclerae are anicteric.  Nares without discharge, and  they are patent.  NECK:  Supple.  No carotid bruits auscultated.  No jugular venous  distention.  LUNGS:  Clear to auscultation and percussion bilaterally.  HEART:  Regular rate and rhythm, with a grade 3/6 holosystolic murmur.  ABDOMEN:  Soft, nondistended.  Bowel sounds are present.  Abdominal aorta is  not palpated.  EXTREMITIES:  Show evidence of clubbing, cyanosis, or edema.  NEUROLOGIC:  No deficits.   PLAN:  1. Implantation of  BiV ICD by Dr. Sherryl Manges.  2. Future work on antiarrhythmics for control of atrial fibrillation      possibly to maintain sinus rhythm.      Maple Mirza, PA     GM/MEDQ  D:  11/12/2005  T:  11/12/2005  Job:  [id number]

## 2010-05-30 NOTE — Assessment & Plan Note (Signed)
[facility]                         ELECTROPHYSIOLOGY OFFICE NOTE   Sandra [name], Sandra [name]                     MRN:          [id number]  DATE:12/22/2005                            DOB:          02-04-1932    Sandra [name] is seen because of concerns about her device.  We had found  some retained suture.  She comes in again.  There is a little bit of  eschar over the lateral aspect of her wound.  I explored it again and  there is some more retained suture which I extracted.  We have gone  ahead and given her some gauze to insert into the wound margins to allow  it to heal by secondary intention.   I will see her again next week.     Duke Salvia, MD, [facility]  Electronically Signed    SCK/MedQ  DD: 12/22/2005  DT: 12/22/2005  Job #: [id number]

## 2010-05-30 NOTE — Op Note (Signed)
NAMEDAZIYAH, COGAN NO.:  [id number]   MEDICAL RECORD NO.:  [id number]          PATIENT TYPE:  INP   LOCATION:  2899                         FACILITY:  [facility]   PHYSICIAN:  Duke Salvia, MD, FACCDATE OF BIRTH:  [birth year]   DATE OF PROCEDURE:  11/12/2005  DATE OF DISCHARGE:                                 OPERATIVE REPORT   PREOPERATIVE DIAGNOSES:  Hypertrophic cardiomyopathy, now with dilated  cardiomyopathy and congestive heart failure, atrial fibrillation,  persistent, and significant conduction disease with first-degree  atrioventricular block, right bundle branch block, left anterior fascicular  block.   POSTOPERATIVE DIAGNOSES:  Hypertrophic cardiomyopathy, now with dilated  cardiomyopathy and congestive heart failure, atrial fibrillation,  persistent, and significant conduction disease with first-degree  atrioventricular block, right bundle branch block, left anterior fascicular  block.   PROCEDURE:  Dual-chamber defibrillator implantation with left ventricular  lead placement and intraoperative defibrillation threshold testing.   SURGEON:  Duke Salvia, MD, [facility].   DESCRIPTION OF PROCEDURE:  Following the obtaining of informed consent, the  patient was brought to the electrophysiology laboratory and placed on the  fluoroscopic table in the supine position.  After routine prep and drape of  the left upper chest, lidocaine was infiltrated in the  prepectoral/subclavicular region.  An incision was made and carried down to  the layer of the prepectoral fascia using electrocautery and sharp  dissection.  A pocket was formed similarly.  Hemostasis was obtained.   Thereafter, attention was turned to gaining access to the extrathoracic left  subclavian vein, which was accomplished without difficulty and without the  aspiration of air or puncture of the artery.  Three separate venipunctures  were accomplished.  Guide wires were placed and retained,  and 0 silk suture  was placed around the more cephalad wire and allowed to hang loosely.   Subsequently, a 7-French sheath was placed through which was passed a St.  Jude Riata [id number], serial # [id number].  Under fluoroscopic guidance, it was  maneuvered to the right ventricular apex, where the bipolar R wave was 7.3  with a pacing impedance of 519, a threshold following 0.6 V at 0.5 msec, and  the current at threshold was 1.3 mA.  There was no diaphragmatic pacing at  10 V.   This lead was secured to the prepectoral fascia, and then over the more  caudal guide wire was placed a 9.5-French sheath, through which was passed a  Medtronic MB2 coronary sinus cannulation catheter.  This was maneuvered to  the coronary sinus, where puff injection demonstrated that there was a  stenosis in the proximal coronary sinus, through which the sheath had barely  passed.  Balloon venography demonstrated a posterior branch coursing more  laterally that was proximal to the stenosis, and a more distal lateral  branch.  This was targeted.  A Whisper wire was placed into this branch and  this allowed for passage of a Guidant 4538 Easytrak I unipolar LV lead to a  junction at between the base and midthird.  The serial number of the lead,  by the way, was  [id number].  In this location, the bipolar L wave was 20.8 with  a pacing impedance of 676 and a threshold of 0.5 V at 0.5 msec.  The 9.5-  French sheath was removed, and over the last retained guide wire was placed  a 7-French sheath, through which was passed a St. Jude 1688TC active-  fixation atrial lead, serial [id number].  The fibrillation wave was 1.1 mV  with impedance of 582.   These leads were then secured to the prepectoral fascia.  The coronary sinus  delivery system was removed.  The lead assessments were stable, and then all  the leads were attached to a St. Jude Atlas [id number] defibrillator, serial  [id number].  Through the device, the bipolar P wave was 1.2  mV with a pacing  impedance of 415 ohms.  That was the fibrillation wave, and following DFT  check with the restoration of sinus rhythm, the pacing threshold was 0.5 V  at 0.5 msec.  The R wave was 4.6 with a pacing impedance of 415 and a  threshold of 0.5 at 0.5.  In the LV, impedance was 730 with a threshold of  0.5 at 0.5.  High-voltage impedance was 34 ohms.   At this point, defibrillation threshold testing was undertaken based on the  Assure trial.  Ventricular fibrillation was induced via the T-wave shock.  After a total duration of 6.5 seconds, a 15-J shock was delivered through a  measured resistance of 37 ohms, terminating ventricular fibrillation,  terminating atrial fibrillation, restoring to sinus rhythm.  At this point,  the device was implanted.  The pocket was copiously irrigated with  antibiotic-containing saline solution.  Hemostasis was assured, and the  leads and the pulse generator were placed in the pocket and secured to the  prepectoral fascia.  The wound was closed in 3 layers in the normal fashion.  The wound was washed and dried, and a benzoin and Steri-Strips dressing was  applied.  Needle counts, sponge counts and instrument counts were correct at  the end of the procedure according to the staff.  The patient tolerated the  procedure without apparent complication.           ______________________________  Duke Salvia, MD, [facility]     SCK/MEDQ  D:  11/12/2005  T:  11/13/2005  Job:  [id number]   cc:   [name], MD  Electrophysiology Laboratory  [facility]

## 2010-05-30 NOTE — H&P (Signed)
NAMETHAYER, INABINET                        ACCOUNT NO.:  [id number]   MEDICAL RECORD NO.:  [id number]                   PATIENT TYPE:  INP   LOCATION:  1823                                 FACILITY:  [facility]   PHYSICIAN:  Rosalyn Gess. Norins, M.D. [facility]         DATE OF BIRTH:  04-12-1932   DATE OF ADMISSION:  02/28/2002  DATE OF DISCHARGE:                                HISTORY & PHYSICAL   PRIMARY CARE PHYSICIAN:  [name], M.D.   PRIMARY CARDIOLOGIST:  Madaline Savage, M.D.,  [facility].   CHIEF COMPLAINT:  Left upper quadrant abdominal pain.   HISTORY OF PRESENT ILLNESS:  Sandra [name] is a very pleasant 77 year old  white female with a recent hospitalization February 11 to February 26, 2002  for acute pulmonary edema. During that stage she underwent cardiac  catheterization which revealed normal coronary arteries.  The patient has a  history of obstructive cardiomyopathy with ejection fraction of 25-30%.  She  has no history of arrhythmia, specifically no history of atrial  fibrillation.  During her hospital stay, she did have intermittent fevers  and leukocytosis.  Her APE resolved with medical therapy.  She was  discharged home in improved condition February 26, 2002.  The patient reports the onset February 27, 2002 in the a.m. of dull,  constant, aching pain in the left upper quadrant accompanied by nausea.  Her  discomfort was worse when she up either walking.  She describes the pain has  significant.  Because of her symptoms, she came to [facility] emergency  department February 27, 2002 at approximately 1800 hours.  Over the next  several hours she did spike a fever to 102.  Evaluation revealed a normal  WBC with a left shift that was 91% segs.  CT scan revealed a splenic  infarct.  Also during the night, she had an episode of bradycardia and  hypotension.  The patient is now admitted for further monitoring evaluation and IV   antibiotics.   PAST MEDICAL HISTORY:  Surgical:  C-section.  Medical:  Usual childhood diseases, history of HOCM with an EF of 25-30%,  history of hypertension, history of hypothyroidism, history of severe  hyperlipidemia.   HABITS:  Tobacco - none.  Alcohol - none.   ALLERGIES:  The patient says she is allergic to XYLOCAINE.   MEDICATIONS:  Per her discharge instructions:  1. Synthroid 75 mcg daily.  2. Aspirin 81 mg daily.  3. Lotensin 10 mg b.i.d.  4. Lasix 20 mg daily.  5. Zetia 10 mg daily.  6. Lanoxin 0.125 mg daily.  7. Coreg 12.5 mg b.i.d.  8. Xanax 0.25 mg q.6h. p.r.n.   FAMILY HISTORY:  Well documented in the recent admission note.   SOCIAL HISTORY:  Well documented in the recent admission note.   PHYSICAL EXAMINATION:  VITAL SIGNS:  Temperature maximum was 102, at my exam  98.6, blood pressure  134/74 to 95/51.  Heart rate ranged from 72 to 44.  Respirations 20, O2 saturation on two  liters was 98%.  GENERAL:  This is a well-nourished, well-developed white female lying on a  stretcher in no acute distress.  HEENT:  Normocephalic, atraumatic.  Conjunctivae clear.  Sclerae clear.  No  oral lesions.  NECK:  Supple without thyromegaly.  NODES:  No adenopathy was noted in the cervical or supraclavicular regions.  CHEST:  No CVA tenderness.  LUNGS:  Good breath sounds were noted throughout without no rales or  wheezes.  No increased work of breathing.  CARDIOVASCULAR:  2+ radial pulses.  Quiet precordium.  She had a regular  bradycardia without murmurs, rubs or gallops.  ABDOMEN:  The patient had positive bowel sounds in all four quadrants.  There was no guarding or rebound.  There was no hepatomegaly.  There was no  splenomegaly with no palpable spleen edge.  She had minimal tenderness in  the left upper quadrant to deep palpation.  There were no masses.  PELVIC/RECTAL:  Deferred.  EXTREMITIES:  Without clubbing, cyanosis or edema.  No deformity was noted.  SKIN:   The patient has palmar erythema with some scaling of skin.  She has  no ecchymoses, no other rash.  NEUROLOGIC:  Nonfocal.   LABORATORIES:  Urinalysis was positive for leukocyte esterase and had 3-6  wbc's per high powered field.  Hemoglobin 13.2, hematocrit 39.1, white count  8400 with 91% segs, 4% lymphs, 3% monos.  Platelet count 156,000.  Sodium  141, potassium 3.9, chloride 106, CO2 22, BUN 23, creatinine 1, glucose 110,  SGOT elevated at 49, SGPT elevated at 55.  CT scan of the abdomen showed  changes consistent with splenic infarct.   ASSESSMENT/PLAN:  1. Splenic infarct.  The patient without a history of arrhythmia with no     atrial fibrillation noted during her last five-day hospitalization or in     the emergency room.  She is status post cardiac catheterization on     Friday, February 13.  This would be a late presentation for atheroembolic     phenomena.  She has had intermittent fevers and leukocytosis raising the     specter of infection.  Other possibilities include possible vasculitis,     myeloid metaplasia or other hematologic disease.  Plan:  Telemetry     admission to rule out arrhythmia.  Will treat with Rocephin 2 g IV now     and 1 g IV q.24h. to cover potential infection.  Will broaden laboratory     studies to include an antinuclear antibody, sed rate.  Will ask pathology     to review the patient's blood smear for any abnormalities.  The patient     will be given Toradol for pain.  2. Cardiovascular.  The patient with a recent episode of acute pulmonary     edema.  She had normal coronaries by catheterization.  Dr. Elsie Lincoln, in his     note, mentioned that there was no evidence of obstruction but she does     have an myocardial myopathy.  The patient has no cardiac complaints at     this time.  There is no evidence of     congestive heart failure.  Plan:  Will continue the patient's medications    per discharge instruction sheet.  3. Hypothyroid disease.   Continue present medications.  4. Hyperlipidemia.  The patient will be continued on Zetia.  Rosalyn Gess Norins, M.D. [facility]    MEN/MEDQ  D:  02/28/2002  T:  02/28/2002  Job:  [id number]   cc:   Madaline Savage, M.D.  [address]  [place]  [place] [address]  Fax: [phone]   [name], M.D. [facility]

## 2010-05-30 NOTE — Op Note (Signed)
NAMEKRITHIKA, TOME NO.:  [id number]   MEDICAL RECORD NO.:  [id number]          PATIENT TYPE:  OIB   LOCATION:  2899                         FACILITY:  [facility]   PHYSICIAN:  Duke Salvia, MD, FACCDATE OF BIRTH:  07/29/1932   DATE OF PROCEDURE:  01/04/2006  DATE OF DISCHARGE:  01/04/2006                               OPERATIVE REPORT   PREOPERATIVE DIAGNOSIS:  Previously implanted device with evidence of  infection.   POSTOPERATIVE DIAGNOSIS:  Deep suture abscess with intact deep tissues.   PROCEDURE:  Wound exploration.   SURGEON:  Duke Salvia, MD, [name].   DESCRIPTION OF PROCEDURE:  Following the obtaining of informed consent,  the patient was brought to the electrophysiology laboratory and placed  on the fluoroscopic table in the supine position.  After routine prep  and drape, lidocaine was infiltrated along the line of the previous  incision, and incision was carried down through the tissues.  There was  a suture found in the midtissue layer.   This was removed.  Deep tissues were intact.  The wound was then closed  in 2 layers, after antibiotic flushing.   The patient tolerated the procedure without apparent complication.      Duke Salvia, MD, [facility]  Electronically Signed     SCK/MEDQ  D:  04/21/2006  T:  04/21/2006  Job:  [id number]

## 2010-05-30 NOTE — Cardiovascular Report (Signed)
NAME:  Sandra [name], Sandra [name]                        ACCOUNT NO.:  [id number]   MEDICAL RECORD NO.:  [id number]                   PATIENT TYPE:  INP   LOCATION:  2107                                 FACILITY:  [facility]   PHYSICIAN:  Madaline Savage, M.D.             DATE OF BIRTH:  1932/08/21   DATE OF PROCEDURE:  02/24/2002  DATE OF DISCHARGE:                              CARDIAC CATHETERIZATION   PROCEDURE PERFORMED:  1. Right heart catheterization.  2. Selective coronary angiography by Judkins technique.  3. No left heart catheterization performed due to left ventricular thrombus     found earlier today on two-dimensional echocardiogram.   PATIENT PROFILE:  The patient is now a 77 year old woman who presented to  the hospital several days ago with acute pulmonary edema.  She has had a  progressive decrease in ejection fraction in the last several years from 55%  down to, currently, 25% due to a hypertrophic cardiomyopathy without  obstruction.  She has no features of obstructive cardiomyopathy on  echocardiogram.   At the time of presentation, she had chest pain.  She had elevated troponin,  0.009, and she had 3/3 elevated CK-MB relative indices with normal CK.  Today's catheterization was felt necessary due to the need to determine if  there was any new coronary disease since the last time she was catheterized,  and to recheck right heart hemodynamics.   RESULTS:  1. Pressures     A. The right atrial pressure was 6.     B. The right ventricular pressure was 60/5, end diastolic pressure 10.     C. Pulmonary artery pressure 60/27, mean of 40.     D. Pulmonary capillary wedge mean pressure was 20; A wave and V wave both        23.     E. The central aortic pressure was 150/80.     F. The Fick cardiac output was 1.9, and the cardiac index was 1.1.  The        thermodilution cardiac output was 3.3, index 1.9.  2. The coronary arteries were found to be normal with a left dominant   system.  All coronaries were normal.  3. No left ventricular angiogram was performed due to LV thrombus found     earlier at echocardiogram.   FINAL DIAGNOSES:  1. Low cardiac output.  2. Elevated wedge pressure and moderately severe pulmonary hypertension.  3. Markedly abnormal left ventricular function by two-dimensional     echocardiogram.    PLAN:  Continuation of Coreg.  Addition of ACE inhibitor and nesiritide to  her current regimen.  Continuation of Lasix.  Addition of digitalis.  Madaline Savage, M.D.    WHG/MEDQ  D:  02/24/2002  T:  02/24/2002  Job:  [id number]   cc:   [facility]   Cardiac Catheterization Lab

## 2010-05-30 NOTE — Assessment & Plan Note (Signed)
[facility]                            CARDIOLOGY OFFICE NOTE   GIA, LUSHER                     MRN:          [id number]  DATE:04/12/2006                            DOB:          [birth year]    Ms. Weberg comes in.  She is complaining of another suture.  Indeed,  there is a suture evident and this was removed.  There is also an  erythematous area just on the medial aspect of her wound about a half a  centimeter superior to the wound.  I pulled back the lid on that and  there is a little bit of sebum/suture there as well.  In any case, the  wound is otherwise well healed.  We will see her again in 4 weeks' time.     Duke Salvia, MD, [facility]  Electronically Signed    SCK/MedQ  DD: 04/12/2006  DT: 04/12/2006  Job #: [id number]

## 2010-05-30 NOTE — Op Note (Signed)
NAMETESSICA, CUPO NO.:  [id number]   MEDICAL RECORD NO.:  [id number]          PATIENT TYPE:  INP   LOCATION:  3703                         FACILITY[facility]  [facility]   PHYSICIAN:  Duke Salvia, MD, FACCDATE OF BIRTH:  02-22-1932   DATE OF PROCEDURE:  11/13/2005  DATE OF DISCHARGE:  11/14/2005                                 OPERATIVE REPORT   PREOPERATIVE DIAGNOSIS:  Pocket hematoma following cardiac resynchronization  therapy implantable cardioverter/defibrillator implantation.   POSTOPERATIVE DIAGNOSIS:  Pocket hematoma following cardiac  resynchronization therapy implantable cardioverter/defibrillator  implantation.   PROCEDURE:  Hematoma evacuation and pocket exploration.   DESCRIPTION OF PROCEDURE:  Following the obtaining of informed consent, the  patient was brought to the cath lab and placed on the fluoroscopic table in  the supine position.  After routine prep and drape of the left upper chest,  lidocaine was infiltrated along the line of the device incision and opened  and carried down to the layer of the device pocket using blunt dissection.  The pocket was opened.  Sutures were all removed, and the device was removed  from the pocket, including from its anchoring suture, and wrapped in an  antibiotic-containing blanket.  Clot was then removed.  No specific bleeders  were noted.  There was some oozing from the exposed pectoralis muscle on the  posterior aspect of the pocket.  This was cauterized and then a suture was  actually placed around the muscle to support hemostasis.  Surgicel was used  on the posterior and anterior aspect of the pocket.  The pocket was  copiously irrigated with antibiotic-containing saline solution prior to the  insertion of the Surgicel and observed.  No further bleeding was noted.  The  device was reimplanted with the Surgicel as noted anteriorly and  posteriorly, and somewhat superiorly, and the wound was then closed in 3  layers in the normal fashion.  The wound was washed and dried and a benzoin  and Steri-Strips dressing was applied.  Needle counts, sponge counts and  instrument counts were correct at the end of the procedure according to the  staff.  The patient tolerated the procedure without apparent complication.           ______________________________  Duke Salvia, MD, [facility]     SCK/MEDQ  D:  11/13/2005  T:  11/14/2005  Job:  [id number]   cc:   Electrophysiology Laboratory  [facility]

## 2010-05-30 NOTE — H&P (Signed)
[place]. [facility]  Patient:    Sandra [name], Sandra [name] Visit Number: [id number] MRN: [id number]          Service Type: MED Location: 2000 2004 01 Attending Physician:  Berry, Jonathan [place] Dictated by:   Abelino Derrick, P.A.C. Admit Date:  05/29/2001                           History and Physical  CHIEF COMPLAINT:  Throat pain.  HISTORY OF PRESENT ILLNESS:  The patient is a 77 year old female followed by Dr. Elsie Lincoln and Dr. Lovell Sheehan with a history of nonischemic cardiomyopathy.  Her EF was 35% by cardiac catheterization October 2002.  She had normal coronaries.  She has a history of IHSS without obstruction.  She was awakened around 4:30 a.m. today with throat pain which radiated to her left shoulder. She had no associated diaphoresis.  She did have some mild nausea.  She has not had chest pain.  The pain seemed to migrate to her left shoulder.  She took an Anacin without relief.  She came to the emergency room after her daughter took her blood pressure and said that her pressure was up and her heart rate was up.  In the emergency room her initial heart rate when she checked into triage was 122.  When she came back and got an EKG her heart rate was 50.  Currently, she is pain-free.  PAST MEDICAL HISTORY:  Remarkable for treated hypertension.  She has no history of diabetes.  She does have hyperlipidemia and has been intolerant to multiple medications in the past.  She is status post remote hysterectomy. She has treated hypothyroidism.  CURRENT MEDICATIONS: 1. Lotensin 10 mg a day. 2. Synthroid 0.1 mg a day. 3. Coreg 12.5 mg b.i.d. 4. ______ 10 mg a day.  ALLERGIES:  Intolerance to STATINS, TRICOR, ______.  She is allergic to NOVOCAINE.  SOCIAL HISTORY:  She is married.  She is a remote smoker.  Her husband is a patient of ours also.  FAMILY HISTORY:  Father died at 54 of congestive failure.  Mother is alive at 55.  She has one brother without  coronary disease.  REVIEW OF SYSTEMS:  There is no history of orthopnea or PND.  She does have vague "breathlessness."  She does have a history of palpitations in the past and, in fact, this morning did feel quivering in her chest.  There is no history of previous arrhythmia.  She has had a recent mammogram that was negative.  PHYSICAL EXAMINATION:  VITAL SIGNS:  Blood pressure 122/62, heart rate 58, respirations 16.  GENERAL:  Well-developed, thin female in no acute distress.  HEENT:  Normocephalic.  She does wear glasses.  NECK:  Without JVD.  She has a soft left carotid bruit.  CHEST:  Clear to auscultation and percussion.  CARDIAC:  Regular rate and rhythm.  Without obvious murmur.  She does have an S4.  ABDOMEN:  Nontender.  No hepatosplenomegaly is appreciated.  EXTREMITIES:  Without edema.  No bruits.  Pulses are 2+/4.  NEUROLOGIC:  Grossly intact.  She is awake, alert, oriented, cooperative. Moves all extremities without obvious deficit.  LABORATORY DATA:  Sodium 142, potassium 4.1, BUN 27, creatinine 1.1, glucose 98.  White count 7.9, hemoglobin 12.7, hematocrit 38, platelets 162.  CK 128, 5.8 MB, troponin 0.07.  Chest x-ray reveals basilar scarring and cardiac enlargement.  EKG shows sinus rhythm, sinus bradycardia, right bundle  branch block.  IMPRESSION: 1. Throat pain, possibly secondary to idiopathic hypertrophic subaortic    stenosis in the setting of an arrhythmia and increased heart rate. 2. 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 3. Hypertension, currently controlled. 4. Right bundle branch block. 5. Sinus bradycardia, question of sick sinus syndrome. 6. Hyperlipidemia with multiple drug intolerances in the past, currently she    is tolerating ______. 7. Nonischemic cardiomyopathy with an ejection fraction of 35% and normal    coronaries October 2002. 8. Hypothyroidism, on Synthroid.  PLAN:  The patient is admitted  to telemetry.  Will cycle enzymes and check TSH.  She has had an echocardiogram done in our office this past Wednesday, and we will need this report. Dictated by:   Abelino Derrick, P.A.C. Attending Physician:  Berry, Jonathan [place]il[place] DD:  05/29/01 TD:  05/30/01 Job: [id number]

## 2010-05-30 NOTE — Discharge Summary (Signed)
[name], [name]                        ACCOUNT NO.:  [id number]   MEDICAL RECORD NO.:  [id number]                   PATIENT TYPE:  INP   LOCATION:  4728                                 FACILITY:  [facility]   PHYSICIAN:  Rene Paci, M.D. [facility]          DATE OF BIRTH:  15-Feb-1932   DATE OF ADMISSION:  02/28/2002  DATE OF DISCHARGE:  03/01/2002                                 DISCHARGE SUMMARY   DISCHARGE DIAGNOSES:  1. Splenic infarct.  2. Klebsiella pneumonia urinary tract infection.  3. Fever.  4. Nausea.  5. Left upper quadrant pain.   BRIEF ADMISSION HISTORY:  Sandra [name] is a 77 year old white female who was  hospitalized February 22, 2002 through February 26, 2002 secondary to acute  pulmonary edema.  She underwent a cardiac catheterization at that time  revealing normal coronaries.  She does have a history of nonobstructive  hypertrophic constructive cardiomyopathy with an EF of 25% to 30%.  She did  not have any documented arrhythmias during that hospitalization.  She denied  any fevers or white count during her hospitalization.  The patient was  discharged home.  On March 01, 2002 she developed dull constant aching in  the left upper quadrant associated with nausea.  The patient's pain was  worse with upright and walking.  She presented to the emergency department  where she was noted to have a fever of 102.  She did have a normal white  count but with left shift with 94% segs.  CT revealed splenic infarct.  The  patient was admitted for further evaluation.   PAST MEDICAL HISTORY:  1. Patient with an ejection fraction of 25% to 30%.  2. Status post cesarean section.  3. Hypertension.  4. Hypothyroidism.  5. Hyperlipidemia.   HOSPITAL COURSE:  1. SPLENIC INFARCT.  The patient was admitted for further evaluation.  The     patient has no history of arrhythmias.  She is several days out from a     cardiac catheterization that was unusual presentation for a  possible     atheroembolus.  We were concerned about possible vasculitis and ordered a     sedimentation rate and ANA.  Sedimentation rate was normal.  There was no     evidence for the etiology of splenic infarct.  Currently the patient's     pain is controlled with anti-inflammatories.  2. I.D.  The patient presented with fever and a left shift.  She was     empirically started on Rocephin.  Urine culture grew greater than 100,000     colonies of E. coli.  This is probably the etiology of her fever, left     shift, nausea and possible left upper quadrant pain.  She has been     changed to oral antibiotics.   DISCHARGE MEDICATIONS:  1. Cipro 500 mg b.i.d. for eight days.  2. Synthroid 75 mcg daily.  3.  Aspirin 81 mg daily.  4. Lotensin 10 mg b.i.d.  5. Lanoxin 0.125 mg daily.  6. Coreg 12.5 mg b.i.d.  7. Xanax as at home.   FOLLOW UP:  Follow up with Drs. Lovell Sheehan and Elsie Lincoln.     Cornell Barman, P.A. [facility]                  Rene Paci, M.D. [facility]    LC/MEDQ  D:  03/01/2002  T:  03/01/2002  Job:  [id number]   cc:   [name], M.D. [facility]   Madaline Savage, M.D.  [address], [address]  [place]  [place] [address]  Fax: [phone]

## 2010-05-30 NOTE — Discharge Summary (Signed)
NAMEMARAI, Sandra NO.:  [id number]   MEDICAL RECORD NO.:  [id number]            PATIENT TYPE:   LOCATION:                                 FACILITY:   PHYSICIAN:  Maple Mirza, PA   DATE OF BIRTH:  1932/08/19   DATE OF ADMISSION:  02/19/2006  DATE OF DISCHARGE:  02/22/2006                               DISCHARGE SUMMARY   ALLERGIES:  Has allergies to NOVOCAIN, NIACIN, and ALTACE, and she is  intolerant of STATINS.   PRINCIPAL DIAGNOSES:  1. Caudal displacement of cardioverter defibrillator to position just      overlying left breast.  2. Tenderness at displaced generator site ICD.  3. Discharging status post ICD pocket revision and exploration with      tissue culture, Dr. Sherryl Manges.   SECONDARY DIAGNOSES:  1. Chronic systolic class II-III congestive heart failure.  2. Nonischemic cardiomyopathy, ejection fraction 15-25% at      catheterization July 2006, normal coronaries noted at that time.  3. Hypertrophic obstructive cardiomyopathy, no significant outflow      tract obstruction.  4. Atrial fibrillation diagnosed July 2006, difficult to control.  5. Right bundle branch block, left anterior fascicular block.  6. Dyslipidemia.  7. Treated hypothyroidism.  8. Bradycardia on beta blockers.  9. Family history of coronary artery disease.  10.Implant St. Jude ATLAS + HS CRT-D November 12, 2005 with      postprocedure pocket hematoma.   PROCEDURES:  Exploration, culture, and revision of ICD pocket February 22, 2006, Dr. Sherryl Manges. No purulence noted.   BRIEF HISTORY:  Sandra [name] is a 77 year old female. She has a history of  nonischemic cardiomyopathy with class II-III NYHA chronic systolic  congestive heart failure. She has symptoms which are worsened when she  is in atrial fibrillation.   She was diagnosed with new onset atrial fibrillation July 2006. She had  a catheterization at that time which demonstrated normal coronaries, but  ejection  fraction was 15-25%. She has HOCM, right bundle branch block,  LAFB. She has no significant outflow tract obstruction. She is unable to  tolerate up titration of beta blocker secondary to bradycardia.   She had CRT-D implanted November 12, 2005. Her INR at the time of implant  was 2. She had postoperative hematoma which was evacuated with  exploration November 13, 2005. She had trouble healing her incision. Has  had return to the office for stitch abscess on a couple of occasions.   Since implantation she has been in sinus rhythm, but she has had pain at  her implant site. Today on examination of February 11 she indicates that  the body of the device has displaced caudally near the very onset of  left breast tissue. It is tender. There is no swelling. She saw Dr.  Graciela Husbands in the office February 11, 2006. He detected warmth at that point.  Suggested she come in for pocket revision and culture of the pocket  contents.   HOSPITAL COURSE:  The patient presented electively February 22, 2006.  She had exploration revision of the pocket and  culture of the  representative tissue and fluid. The device was copiously irrigated, and  the patient is to return to short stay see where she is discharging. She  will go home on a seven day course of oral Keflex 500 mg one tablet 1/2  hour before breakfast, lunch, dinner, and bedtime.   OTHER MEDICATIONS:  1. Coumadin 5 mg one tab Saturday, Sunday, Tuesday, Thursday, Friday      2.5 mg or 1/2 tab Monday and Wednesday.  2. Zetia 10 mg daily.  3. Synthroid 100 mcg daily.  4. Lotensin 10 mg twice daily.  5. Coreg 12.5 mg twice daily.  6. Enteric-coated aspirin 81 mg daily.   In the morning of Tuesday, February 12, she is asked to remove the  bandage and leave the incision open to the air. She is asked not to  drive for the next two days and not to lift anything heavier than 10  pounds for the next two weeks. She is to keep her incision dry for the  next  seven days and to sponge bathe until Monday, February 18. Follow-up  [facility] is [address]. Wound check Monday,  February 25, at 9:40 in the morning. She will see  Dr. Graciela Husbands, Monday, March 31, at 11:30 a.m. Laboratory studies pertinent  to this admission have been dictated on her admission History and  Physical.      Maple Mirza, PA     GM/MEDQ  D:  02/22/2006  T:  02/22/2006  Job:  [id number]   cc:   Duke Salvia, MD, [facility]  Madaline Savage, M.D.  [name], MD

## 2010-05-30 NOTE — Assessment & Plan Note (Signed)
[facility]                         ELECTROPHYSIOLOGY OFFICE NOTE   AMERICUS, SCHEURICH                     MRN:          [id number]  DATE:12/17/2005                            DOB:          [birth year]    Sandra [name] was seen because of concerns about her device site.   On examination, there was an eschar in the lateral aspect and a retained  suture on the medial aspect. The latter was easily removed. The lateral  aspect eschar was removed. There was a piece of tissue in there. I also  further explored the wound and it appeared as if the 3-0 suture was also  retained. The wound looks better. I have asked her to take warm  compresses and we will put her on antibiotics of Keflex 500 three times  a day and will look at the wound again next week.     Duke Salvia, MD, [facility]  Electronically Signed    SCK/MedQ  DD: 12/17/2005  DT: 12/17/2005  Job #: [id number]

## 2010-05-30 NOTE — H&P (Signed)
[name], [name] NO.:  [id number]   MEDICAL RECORD NO.:  [id number]          PATIENT TYPE:  OIB   LOCATION:  2899                         FACILITY:  [facility]   PHYSICIAN:  Duke Salvia, MD, FACCDATE OF BIRTH:  Mar 16, 1932   DATE OF ADMISSION:  02/22/2006  DATE OF DISCHARGE:                              HISTORY & PHYSICAL   CARDIOLOGIST:  Madaline Savage, M.D.   PRIMARY CAREGIVER:  [name], M.D.   ELECTROPHYSIOLOGIST:  Duke Salvia, MD, [facility].   PRESENTING CIRCUMSTANCE:  I am here once again.  Don't I look fine this  morning?   HISTORY OF PRESENT ILLNESS:  This is a 77 year old female who has a  sparkling personality. She has a history of nonischemic cardiomyopathy  with class II-III NYHA chronic, systolic congestive heart failure  symptoms.  They are worsened when she is in atrial fibrillation.   She was diagnosed with new onset of atrial fibrillation in July 2006.  Because it might have had an ischemic derivation, she had a left heart  catheterization by Dr. Elsie Lincoln which demonstrated normal coronary  anatomy. Ejection fraction was 15-25%, with 2+ mitral regurgitation.  She has hypertrophic cardiomyopathy, with right bundle branch block and  left anterior fascicular block.  She has no significant outflow tract  obstruction. She was unable to tolerate uptitration of beta blockers  secondary to bradycardia, and would get very short of breath when in  atrial fibrillation.   She had CRT-D implanted November 12, 2005.  Her INR at the time of  implant was 2.  She had a postoperative hematoma which was evacuated  with exploration on November 13, 2005.   Since implantation, she has been in sinus rhythm, but she has had pain  at her implant site. Today, she indicates that the body of the device  has displaced caudally very near the onset of her left breast.  It is  tender.  There is no swelling.  Dr. Graciela Husbands at office visit February 11, 2006 detected  warmth. there, however. She has presented today for pocket  revision and culture of the pocket contents.   ALLERGIES:  1. NOVOCAIN.  2. NIACIN.  3. ALTACE.  4. Intolerant of STATINS.  In fact, she believes that her atrial      fibrillation actually dates from incipient use of statin drugs.   PAST MEDICAL HISTORY:  1. Chronic, systolic class II-III  congestive heart failure.  2. Nonischemic cardiomyopathy. Ejection fraction 50-25% at      catheterization July 2006, but normal coronary anatomy.  3. Hypertrophic obstructive cardiomyopathy.  No significant outflow      tract obstruction.  4. Atrial fibrillation diagnosed July 2006, has proven difficult to      control.  5. Right bundle branch block, left anterior fascicular block.  6. Dyslipidemia.  7. Treated hypothyroidism.  8. Bradycardia, on beta blockers.  9. Family history of coronary artery disease.  10.Implantation of St. Jude ATLAS HF CRT-D, November 12, 2005.  11.Pocket revision with evacuation of hematoma, November 13, 2005.   PHYSICAL EXAMINATION:  VITAL SIGNS:  The patient is afebrile.  Blood  pressure 136/73, pulse is regular at 82, respiration rate is 18, oxygen  saturation 97%.  GENERAL:  The patient is alert and oriented x3.  She complains of  tenderness just above the left breast, and one can palpate the device  generator at that position.  LUNGS:  Clear to auscultation bilaterally.  HEART:  Regular rate and rhythm.  ABDOMEN:  Soft, nondistended.  Bowel sounds are present.  EXTREMITIES:  Show no evidence of edema.  CHEST WALL:  The Incision itself is well healed.  No evidence of  swelling, drainage, or erythema at the pocket, though the device itself  is displaced caudally.   LABORATORY STUDIES:  PTT is 28, PT 15.3, INR 1.2.  Complete blood count:  White cells 6.2, hemoglobin 13.6, hematocrit 40.6, platelets 185.  Serum  electrolytes:  Sodium 140, potassium 3.7, chloride 101, bicarbonate 28,  BUN is 15,  creatinine 1.18, glucose 74.   PLAN:  Pocket evaluation, device repositioning, with culture of pocket  contents, Dr. Sherryl Manges.      Maple Mirza, [place]      Duke Salvia, MD, [facility]  Electronically Signed    GM/MEDQ  D:  02/22/2006  T:  02/22/2006  Job:  [id number]

## 2010-05-30 NOTE — Discharge Summary (Signed)
NAME:  Sandra [name], Sandra [name]                        ACCOUNT NO.:  [id number]   MEDICAL RECORD NO.:  [id number]                   PATIENT TYPE:  INP   LOCATION:  4728                                 FACILITY:  [facility]   PHYSICIAN:  Madaline Savage, M.D.             DATE OF BIRTH:  1932-11-04   DATE OF ADMISSION:  02/22/2002  DATE OF DISCHARGE:  02/26/2002                                 DISCHARGE SUMMARY   DISCHARGE DIAGNOSES:  1. Acute pulmonary edema, improved at discharge.  2. Nonischemic cardiomyopathy with an ejection fraction of 25-35%.  3. Normal coronaries by catheterization this admission.  4. Hyperlipidemia, Zetia resumed.  5. Mild iatrogenic hyperthyroidism with a TSH of 0.2 this admission.  6. History of anxiety.  7. Mildly elevated liver function tests.   HOSPITAL COURSE:  The patient is a 77 year old female followed by Dr. Elsie Lincoln  and Dr. Lovell Sheehan with a history of ischemic cardiomyopathy.  She has  hyperlipidemia.  She has had a previously negative cath in the past.  Her  ejection fraction by echo has been variable in the past at 35-50%.  She was  admitted on February 22, 2002 by Dr. Domingo Sep with a sudden shortness of  breath and acute pulmonary edema.  On arrival, her O2 saturation was 76%.  Her admission blood pressure was 146/100.  She was admitted to the MICU,  treated with IV diuretics, and put on BiPAP.  Her Coreg was cut back to 6.25  mg with acute pulmonary edema.  She was started on IV heparin.  CK-MB,  troponin's were obtained.  Troponin's were slightly elevated at 0.09.  By  the 12th she was improved and her BiPAP was weaned off.  She had some mild  renal insufficiency on admission also and her creatinine was 1.9, this  improved to 1.5.  Her Lasix was held.  She was set up with diagnostic  catheterization February 24, 2002 by Dr. Elsie Lincoln.  This revealed totally  normal coronaries.  No LV angiogram was done.  Echocardiogram done revealed  an EF of 25-35% and a  suggestion of a layered nonmobile thrombus along the  apical wall of the LV.  Lanoxin was added to her medications.  The patient  was put on Natrecor for 24 hours and had a good diuresis with this.  We felt  she could be discharged February 26, 2002.  Her discharge weigh is 149.5.  Dr. Elsie Lincoln reviewed her medications prior to discharge.   DISCHARGE MEDICATIONS:  1. Synthroid has been cut back to 0.075 mg a day.  2. Aspirin 81 mg a day.  3. Lotensin 10 mg twice a day.  4. Lasix 20 mg a day.  5. Zetia 10 mg a day.  6. Lanoxin .125 mg once a day.  7. Coreg 12.5 mg twice a day.  8. Xanax p.r.n.   LABORATORY DATA:  White count 12.1, hemoglobin 14.3, hematocrit 41.6,  platelet count 151.  INR 1.  Her last renal function shows a sodium 141,  potassium 4, BUN 26, creatinine 1.  Liver function show an AST of 59, ALT  65, CKs are negative, troponin was 0.09.  Lipid profile shows a cholesterol  of 294, triglycerides 47, HDL 57, LDL 228, it should be noted the patient  had stopped her Zetia two weeks ago on her own.  The patient's TSH was 0.2  and her Synthroid was cut back.   IMAGING STUDIES:  Chest x-ray on February 22, 2002 shows bilateral parahilar  airspace disease with acute pulmonary edema.  Echocardiogram as noted above.  EKG shows sinus rhythm, sinus bradycardia with a right bundle branch block.   DISPOSITION:  The patient discharged in stable condition.   FOLLOW UP:  Will follow up with Dr. Elsie Lincoln in 2-3 weeks.  At that time, she  will have a CMET, lipid profile, and a digoxin level.     Abelino Derrick, P.A.                      Madaline Savage, M.D.    Lenard Lance  D:  02/26/2002  T:  02/26/2002  Job:  [id number]   cc:   [name], M.D. [facility]

## 2010-05-30 NOTE — Assessment & Plan Note (Signed)
[facility]                         ELECTROPHYSIOLOGY OFFICE NOTE   Sandra [name], Sandra [name]                     MRN:          [id number]  DATE:03/17/2006                            DOB:          1932-07-25    Sandra [name] returns today for followup.  She is seen by Delsa Grana  today for a wound check and referred over for evaluation of stitch  abscess.  The patient is a very pleasant middle-aged woman status post  ICD insertion, who initially had pocket hematoma which underwent  irrigation and pocket hematoma evacuation.  She then developed migration  of her ICD pocket and ultimately had it revised several weeks ago.  She  was placed on antibiotics and returns here for followup.  The patient  has had no fevers or chills.  She has a little bit of tenderness over  the ICD insertion site.  She notes that there is an area of erythema and  a stitch, which was removed, present.   PHYSICAL EXAMINATION:  GENERAL:  She is a pleasant, well-appearing woman  in no distress.  VITAL SIGNS:  Blood pressure 150/80, pulse 70 and regular.  Respirations  were 18.  NECK:  No jugular venous distention.  LUNGS:  Clear bilaterally to auscultation.  CHEST:  Examination of her ICD insertion site demonstrated a very small  (2 mm) area on the lateral aspect of the incision.  There are minimal  amounts of erythema here.  There is minimal drainage.   IMPRESSION:  1. Nonischemic cardiomyopathy.  2. Status post implantable cardioverter/defibrillator insertion.   DISCUSSION:  Overall, Sandra [name] is stable.  It appears that her  incision is healing nicely.  She is instructed to continue her  antibiotics and wash the area on a regular basis.  Follow up in the  office with Dr. Graciela Husbands.     Doylene Canning. Ladona Ridgel, MD  Electronically Signed    GWT/MedQ  DD: 03/17/2006  DT: 03/18/2006  Job #: [id number]

## 2010-05-30 NOTE — Assessment & Plan Note (Signed)
[facility]                         ELECTROPHYSIOLOGY OFFICE NOTE   [name], [name]                     MRN:          [id number]  DATE:02/11/2006                            DOB:          [birth year]    Sandra [name] comes in.  She continues to have pain at the site of her  defibrillator implant site.  She actually thinks that it has migrated  into her breast tissue, and it is very tender at the caudal end of this.  There had been a stitch abscess.  This has healed.  There is some  subcutaneous atrophy at the site of the incision.  I am concerned there  is some warmth over this area, as well, and it is very tender as noted  at the caudal aspect.  I am concerned that there may be infection.  The  patient is not having systemic signs of that.  The other possibility it  that it migrated with loosening of the anchoring suture.  In either  case, given the discomfort associated with it, I have recommended that  we undertake a pocket revision and exploration with cultures.  To do  that, we would need to HOLD ON ANTIBIOTIC INFUSION UNTIL THE INCISION IS  MADE AND THE WOUND IS ASPIRATED.  In the event that there is clear pus,  lead removal would be reasonable.  The device was implanted on November 13, 2006, so it should come out without too much difficulty.  She  understands this, and it will be done in the next couple of weeks.     Duke Salvia, MD, [facility]  Electronically Signed    SCK/MedQ  DD: 02/11/2006  DT: 02/11/2006  Job #: [id number]

## 2010-05-30 NOTE — Assessment & Plan Note (Signed)
[facility]                           ELECTROPHYSIOLOGY OFFICE NOTE   ROSILAND, SEN                     MRN:          [id number]  DATE:11/23/2005                            DOB:          1932/09/28    Ms. Bilyeu was seen today as a research visit, and actually her device was  checked by industry with the numbers as follows.  Her battery voltage was  greater than 3.20 with a charge time of 10.1 seconds.  P-waves measured 1.9  mV with an atrial capture threshold of 0.5 volts at 0.5 msec and an atrial  lead impedence of 510 ohms.  R waves measured 9.6 mV with a right  ventricular pacing threshold at 0.5 volts at 0.5 msec and a right  ventricular lead impedence of 455 ohms.  Left ventricular pacing threshold  was also 0.5 volts at 0.5 msec with a left ventricular lead impedance of 760  ohms.  There were no episodes since implant date.  No changes are made in  her parameters.  Her hematoma today did seem to be resolving somewhat.  I  did remove her Steri-Strips, and there was a small, 0.5 cm area on the  distal end of the incision that was open.  I did put a Band-Aid and  instructed the patient if it had changed in any way to call us back  immediately.  She will be seen again for follow up in 3 months' time.      Altha Harm, LPN  Electronically Signed      Duke Salvia, MD, [facility]  Electronically Signed   PO/MedQ  DD: 11/23/2005  DT: 11/23/2005  Job #: [id number]

## 2010-05-30 NOTE — Op Note (Signed)
[name], [name] NO.:  [id number]   MEDICAL RECORD NO.:  [id number]          PATIENT TYPE:  OIB   LOCATION:  2899                         FACILITY:  [facility]   PHYSICIAN:  Duke Salvia, MD, FACCDATE OF BIRTH:  1932-05-10   DATE OF PROCEDURE:  02/22/2006  DATE OF DISCHARGE:                               OPERATIVE REPORT   PREOPERATIVE DIAGNOSIS:  Painful defibrillator pocket, with question of  infection.   POSTOPERATIVE DIAGNOSIS:  Non-secured defibrillator without overt  evidence of infection.   PROCEDURE:  Exploration of the pocket.   Following obtaining informed consent, the patient was brought to the  electrophysiology laboratory, placed on the fluoroscopic table in supine  position.  After routine prep and drape, lidocaine was filtrated along  the line of the previous incision and carried down to layer of the  device pocket using sharp dissection.  The pocket was opened.  What  little fluid was there appeared to be coming from the lidocaine.  It was  nonopaque.  The tissue color was somewhat grayish. The extruded fluid  was cultured and then the floor of the device pocket was cultured as  well.   It was noted that the device was no longer secured.  An anchoring suture  was placed and the device was secured.  The pocket was extended a little  bit cephalad to reduce tension over the device.  The pocket was then  closed in three layers in normal fashion, after extensive irrigation of  the pocket was undertaken.  Hemostasis was obtained and then the wound  was closed in three layers in normal fashion.  It was washed, dried and  a benzoin Steri-Strip dressing was applied.  Needle counts, sponge  counts and instrument counts were correct at the procedure according to  staff.  The patient tolerated the procedure without apparent  complication.      Duke Salvia, MD, [facility]  Electronically Signed     SCK/MEDQ  D:  02/22/2006  T:  02/22/2006  Job:   [id number]   cc:   electrophys lab  [name], MD  [employer] [facility] [facility]

## 2010-05-30 NOTE — Letter (Signed)
October 26, 2005    [name], MD  [address] [place], [place] [address]   RE:  ALEYZA, SALMI  MRN:  [id number]  /  DOB:  12-07-1932   Dear Jonny Ruiz:   Lona Millard came in today extolling your praises.  As you know, she has  hypertrophic cardiomyopathy that his nonobstructive and I have been involved  in the care of her daughter as well.  She has had unfortunate difficulty  communicating with Dr. Elsie Lincoln of late and has asked that her care be  transferred.  I think Dr. Elsie Lincoln, for whom patient care is always the top  thing, will be understanding.   In any case, she has hypertrophic cardiomyopathy and has developed left  ventricular function in the context of the above.  She was also found to be  in atrial fibrillation 1-1/2 years ago around July 2006, and underwent  coumadinization and subsequent cardioversion which was accomplished in  December, which was associated with marked amelioration of her symptoms.  Her ejection fraction has been depressed dating back some number of years at  least far back as 2005.  In 2003, it was 45-50%.  She had undergone cardiac  catheterization in 2002, at which time her ejection fraction was 30% and her  coronary arteries were normal.  There must have been some improvement in the  interval of her LV function.   She has been on Coreg at various doses with recent down titration  necessitated by relative bradycardia with heart rates in the 30s and 40s.  She denies syncope.  She has had some dizziness.   She does not have edema or nocturnal dyspnea or orthopnea.   PAST MEDICAL HISTORY:  1. GI disorder with bowel and constipation.  2. Anxiety disorder.  3. Treated hypothyroidism.  4. Intolerance of statins.   SOCIAL HISTORY:  She is married and retired.  She has three children.  She  does not use cigarettes, alcohol or recreational drugs.   PHYSICAL EXAMINATION:  GENERAL:  She is an elderly, Caucasian female  appearing her  stated age of 79.  VITAL SIGNS:  Her blood pressure was elevated at 158/78, pulse 88.  HEENT:  No xanthomas.  NECK:  Neck veins were 8 cm.  Carotids were brisk and full bilaterally  without bruits.  BACK:  Without kyphosis or scoliosis.  LUNGS:  Clear.  HEART:  Regular without murmurs, rubs or gallops.  ABDOMEN:  Soft with active bowel sounds.  No midline pulsation.  EXTREMITIES:  Femoral pulses were 2+, distal pulses were intact.  There was  no clubbing, cyanosis or edema.  NEUROLOGIC:  Exam was grossly normal.  SKIN:  Warm and dry.   Electrocardiogram dated today demonstrated atrial fibrillation with a right  bundle branch block with left axis deviation, QRS duration was 140 msec,  axis was -77, QTC 472.   We undertook treadmill testing to look for chronotropic control.  Within 30  seconds, her heart rate was to 135 and the test was terminated.   IMPRESSION:  1. Hypertrophic nonobstructive cardiomyopathy with an ejection fraction      now of 25% without evidence of coronary obstruction in 2002.  2. Class III congestive failure.  3. Right bundle branch block, left axis deviation.  4. Atrial fibrillation with a poorly controlled ventricular response.  5. Propensity towards bradycardia precluding further uptitration of her      beta-blocker.  6. Intolerance to statins on Zetia.  7. Treated hypothyroidism.  John, Ms. Sonnen has a nonischemic cardiomyopathy in the setting of  hypertrophic heart disease.  With her depressed left ventricular function,  she is clearly a candidate for ICD implantation.  With her bradycardia, I  would undertake dual-chamber device implantation to allow for uptitration of  her beta-blocker.  In reviewing her electrocardiogram from back as recently  as 2002, she had significant first-degree AV block with a PR interval of 220  msec, so my inclination would be further to add a left ventricular lead at  the time with device implantation.  This would  certainly be indicated with  her congestive heart failure and her broad QRS.   I reviewed this with her.  We will plan to undertake that.   If after we do the above we cannot accomplish adequate rate control or  cannot eliminate symptoms with adequate rate control, then we would want to  consider entering the drug therapy, probably using amiodarone and/or  dofetilide.  She is adverse to the amiodarone because of potential side  effects.  I can appreciate that.   We will plan to proceed in the next couple of weeks as her INR becomes less  excessive.   John, thanks very much for asking [place] to participate in her care and we will  keep you abreast of the situation.    Sincerely,     ______________________________  Duke Salvia, MD, [facility]    SCK/MedQ  /  Job #:  [id number]  DD:  10/26/2005 / DT:  10/27/2005

## 2010-06-13 ENCOUNTER — Telehealth: Payer: Self-pay | Admitting: Internal Medicine

## 2010-06-13 NOTE — Telephone Encounter (Signed)
Left message for patient for her to schedule her next appointment for a device check in august.

## 2010-06-13 NOTE — Telephone Encounter (Signed)
Pt wants to know when does she needs to get her device check. Pt is going on vacation and wants to make sure everything is good.

## 2010-06-16 ENCOUNTER — Encounter (INDEPENDENT_AMBULATORY_CARE_PROVIDER_SITE_OTHER): Payer: Self-pay | Admitting: Surgery

## 2010-06-16 ENCOUNTER — Other Ambulatory Visit: Payer: Self-pay | Admitting: Internal Medicine

## 2010-06-18 ENCOUNTER — Ambulatory Visit: Payer: Medicare Other

## 2010-06-18 DIAGNOSIS — I4891 Unspecified atrial fibrillation: Secondary | ICD-10-CM

## 2010-06-18 LAB — POCT INR: INR: 2.8

## 2010-06-18 NOTE — Patient Instructions (Signed)
2.5mg  and 5 mg alternative

## 2010-06-19 ENCOUNTER — Encounter: Payer: Self-pay | Admitting: Internal Medicine

## 2010-06-20 ENCOUNTER — Encounter: Payer: Self-pay | Admitting: *Deleted

## 2010-06-23 ENCOUNTER — Encounter: Payer: Self-pay | Admitting: *Deleted

## 2010-07-04 ENCOUNTER — Encounter: Payer: Self-pay | Admitting: Internal Medicine

## 2010-07-04 ENCOUNTER — Ambulatory Visit (INDEPENDENT_AMBULATORY_CARE_PROVIDER_SITE_OTHER): Payer: Medicare Other | Admitting: Internal Medicine

## 2010-07-04 VITALS — BP 122/72 | HR 72 | Temp 98.2°F | Resp 16 | Ht 63.0 in | Wt 150.0 lb

## 2010-07-04 DIAGNOSIS — F419 Anxiety disorder, unspecified: Secondary | ICD-10-CM

## 2010-07-04 DIAGNOSIS — N182 Chronic kidney disease, stage 2 (mild): Secondary | ICD-10-CM

## 2010-07-04 DIAGNOSIS — J069 Acute upper respiratory infection, unspecified: Secondary | ICD-10-CM

## 2010-07-04 DIAGNOSIS — I10 Essential (primary) hypertension: Secondary | ICD-10-CM

## 2010-07-04 DIAGNOSIS — E785 Hyperlipidemia, unspecified: Secondary | ICD-10-CM

## 2010-07-04 DIAGNOSIS — E039 Hypothyroidism, unspecified: Secondary | ICD-10-CM

## 2010-07-04 LAB — BASIC METABOLIC PANEL
BUN: 35 mg/dL — ABNORMAL HIGH (ref 6–23)
CO2: 28 mEq/L (ref 19–32)
Calcium: 9.8 mg/dL (ref 8.4–10.5)
Creatinine, Ser: 1.3 mg/dL — ABNORMAL HIGH (ref 0.4–1.2)

## 2010-07-04 LAB — T4, FREE: Free T4: 1.58 ng/dL (ref 0.60–1.60)

## 2010-07-04 LAB — T3, FREE: T3, Free: 2.7 pg/mL (ref 2.3–4.2)

## 2010-07-04 MED ORDER — AZITHROMYCIN 500 MG PO TABS: 500.0000 mg | ORAL_TABLET | Freq: Every day | ORAL | Status: AC

## 2010-07-04 NOTE — Progress Notes (Signed)
Subjective:    Patient ID: Sandra [name], female    DOB: 07-Jan-1933, 77 y.o.   MRN: [id number]  HPI Patient is a 77 year old white female with a history of hypertension hypothyroidism hyperlipidemia and atrial fibrillation related to hypertrophic obstructive cardiomyopathy.  She denies any chest pain she states it him up from recent cardio report for her heart said that she was only in and out of atrial fibrillation momentarily during the last quarter.  She feels well has not had any excessive shortness of breath has some acute symptoms of upper respiratory tract viral infection but no significant bronchitis at this time.      Review of Systems  Constitutional: Negative for activity change, appetite change and fatigue.  HENT: Positive for congestion, postnasal drip and sinus pressure. Negative for ear pain and neck pain.   Eyes: Negative for redness and visual disturbance.  Respiratory: Negative for cough, shortness of breath and wheezing.   Gastrointestinal: Negative for abdominal pain and abdominal distention.  Genitourinary: Negative for dysuria, frequency and menstrual problem.  Musculoskeletal: Negative for myalgias, joint swelling and arthralgias.  Skin: Negative for rash and wound.  Neurological: Negative for dizziness, weakness and headaches.  Hematological: Negative for adenopathy. Does not bruise/bleed easily.  Psychiatric/Behavioral: Negative for sleep disturbance and decreased concentration.   Past Medical History  Diagnosis Date  . Atrial fibrillation   . Hyperlipidemia   . Hypothyroidism   . Bradycardia   . Cardiomyopathy, hypertrophic nonobstructive   . Anal fissure   . Adenomatous colon polyp   . Diverticulosis   . AICD (automatic cardioverter/defibrillator) present   . Systolic heart failure   . History of colonoscopy    Past Surgical History  Procedure Date  . Cardioversion   . Cardiac defibrillator placement     reports that she has quit smoking.  She does not have any smokeless tobacco history on file. She reports that she does not drink alcohol or use illicit drugs. family history includes COPD in her father; Cancer in her father and paternal aunt; Cardiomyopathy in her daughter; and Heart disease in her father and mother. Allergies  Allergen Reactions  . Codeine     REACTION: nausea  . Niacin     REACTION: rash  . Procaine Hcl     REACTION: smothers  . Ramipril     REACTION: unspecified  . Statins     REACTION: muscle aches        Objective:   Physical Exam  Constitutional: She is oriented to person, place, and time. She appears well-developed and well-nourished. No distress.  HENT:  Head: Normocephalic and atraumatic.  Right Ear: External ear normal.  Left Ear: External ear normal.  Nose: Nose normal.  Mouth/Throat: Oropharynx is clear and moist.  Eyes: Conjunctivae and EOM are normal. Pupils are equal, round, and reactive to light.  Neck: Normal range of motion. Neck supple. No JVD present. No tracheal deviation present. No thyromegaly present.  Cardiovascular: Intact distal pulses.   No murmur heard.      Irregular rate and rhythm 2/6 systolic murmur  Pulmonary/Chest: Effort normal and breath sounds normal. She has no wheezes. She exhibits no tenderness.  Abdominal: Soft. Bowel sounds are normal.  Musculoskeletal: Normal range of motion. She exhibits no edema and no tenderness.  Lymphadenopathy:    She has no cervical adenopathy.  Neurological: She is alert and oriented to person, place, and time. She has normal reflexes. No cranial nerve deficit.  Skin: Skin is warm and  dry. She is not diaphoretic.  Psychiatric: She has a normal mood and affect. Her behavior is normal.          Assessment & Plan:  We will monitor her thyroid with a T3 and T4 since her TSH was over suppressed last time.  Her blood pressure stable on her current medications cholesterol check is due at this time since she is going to travel  and she is at risk for upper respiratory tract infection we'll give her Z-Pak to hold the would not begin with her current symptoms I believe her current symptomology is more likely viral in etiology

## 2010-07-17 ENCOUNTER — Encounter: Payer: Self-pay | Admitting: Internal Medicine

## 2010-07-23 ENCOUNTER — Ambulatory Visit (INDEPENDENT_AMBULATORY_CARE_PROVIDER_SITE_OTHER): Payer: Medicare Other | Admitting: Internal Medicine

## 2010-07-23 DIAGNOSIS — I4891 Unspecified atrial fibrillation: Secondary | ICD-10-CM

## 2010-07-23 LAB — POCT INR: INR: 2.6

## 2010-07-23 NOTE — Patient Instructions (Signed)
Same dose 4 weeks 

## 2010-08-08 ENCOUNTER — Other Ambulatory Visit: Payer: Self-pay | Admitting: *Deleted

## 2010-08-08 ENCOUNTER — Telehealth: Payer: Self-pay | Admitting: Internal Medicine

## 2010-08-08 NOTE — Telephone Encounter (Signed)
Per pt call, pt needs RX refill of tikosyn 250 and tikosyn 125. Call into [facility] store in [place].[phone]. If called in today pharmacy says they can fed-ex RX to pt and she can get in tomorrow. Please call in RX to [facility] today.     [name] [phone]: IONGEXB 250

## 2010-08-08 NOTE — Telephone Encounter (Signed)
Pt has called again and is very concerned that this is not going to get done in time for them to ship to her.  It is very important that this be done ASAP.  Please call patient back and advise when this is done.

## 2010-08-08 NOTE — Telephone Encounter (Signed)
Refill done and [facility] will call the patient to see if the can Fedex

## 2010-08-11 MED ORDER — DOFETILIDE 250 MCG PO CAPS: 250.0000 ug | ORAL_CAPSULE | Freq: Two times a day (BID) | ORAL | Status: DC

## 2010-08-11 NOTE — Telephone Encounter (Signed)
rx sent today. Danielle Rankin

## 2010-08-13 ENCOUNTER — Ambulatory Visit (INDEPENDENT_AMBULATORY_CARE_PROVIDER_SITE_OTHER): Payer: Medicare Other | Admitting: *Deleted

## 2010-08-13 DIAGNOSIS — I421 Obstructive hypertrophic cardiomyopathy: Secondary | ICD-10-CM

## 2010-08-13 DIAGNOSIS — I4891 Unspecified atrial fibrillation: Secondary | ICD-10-CM

## 2010-08-13 DIAGNOSIS — Z95810 Presence of automatic (implantable) cardiac defibrillator: Secondary | ICD-10-CM

## 2010-08-13 NOTE — Progress Notes (Signed)
icd check

## 2010-08-20 ENCOUNTER — Other Ambulatory Visit: Payer: Self-pay | Admitting: Internal Medicine

## 2010-08-27 ENCOUNTER — Ambulatory Visit (INDEPENDENT_AMBULATORY_CARE_PROVIDER_SITE_OTHER): Payer: Medicare Other | Admitting: Internal Medicine

## 2010-08-27 DIAGNOSIS — I4891 Unspecified atrial fibrillation: Secondary | ICD-10-CM

## 2010-08-27 NOTE — Patient Instructions (Signed)
Same dose 

## 2010-08-28 ENCOUNTER — Encounter: Payer: Self-pay | Admitting: Internal Medicine

## 2010-09-16 ENCOUNTER — Other Ambulatory Visit: Payer: Self-pay | Admitting: Internal Medicine

## 2010-09-17 ENCOUNTER — Ambulatory Visit (INDEPENDENT_AMBULATORY_CARE_PROVIDER_SITE_OTHER): Payer: Medicare Other | Admitting: Surgery

## 2010-09-17 ENCOUNTER — Encounter (INDEPENDENT_AMBULATORY_CARE_PROVIDER_SITE_OTHER): Payer: Self-pay | Admitting: Surgery

## 2010-09-17 VITALS — BP 94/68 | HR 70

## 2010-09-17 DIAGNOSIS — N6019 Diffuse cystic mastopathy of unspecified breast: Secondary | ICD-10-CM

## 2010-09-17 NOTE — Progress Notes (Signed)
CHIEF COMPLAINT Breast followup.  History of present illness: This patient has had fibrocystic issues and I followed her annually almost since 1993. She's had no changes since I saw her last year. I told her she could simply followup with her primary care but she wants to be seen here.  EXAMINATION: GENERAL: The patient is alert, oriented, healthy appearing. BREASTS: The breasts are symmetric in appearance. There is a deformity from her pacer on the left. There is no mass, tenderness, nipple or skin changes noted. There is mild fibrocystic regularity which has been present as long as I can remember.  LYMPHATICS: There is no axillary or supraclavicular adenopathy on either side.  DATA REVIEWED Mammogram was done in June and is negative.  IMPRESSION Stable exam no suspicious areas noted.  PLANNED I will continue to follow her here and yearly. We will see her p.r.n. if she finds any abnormality in her breasts.

## 2010-09-19 ENCOUNTER — Telehealth: Payer: Self-pay | Admitting: Internal Medicine

## 2010-09-19 ENCOUNTER — Telehealth: Payer: Self-pay | Admitting: *Deleted

## 2010-09-19 NOTE — Telephone Encounter (Signed)
Left detailed message on voicemail.  

## 2010-09-19 NOTE — Telephone Encounter (Signed)
Pt called and stated she is feeling tired, no energy, her blood pressure is running 92/58, 95/63, 94/59.  She decreased her Lasix last night by 10 mg to see if that would help.  She is concerned that this is too low.  Please call her back with advice.  If not at home please call her cell, she will have that with her.  [phone]

## 2010-09-19 NOTE — Telephone Encounter (Signed)
Pt is concerned about low BP 97/58 and her Lasix dosage being 40 mg from Dr. Graciela Husbands, and is having dizzy spells.  Advised to let Dr. Odessa Fleming office know about this today.

## 2010-09-19 NOTE — Telephone Encounter (Signed)
Pt complaining of a dull headache, lightheaded, etc.  She went to her surgeon's office and her BP was low.  96/50?  Yesterday bp = 92/58 and 95/63.  Today it was 94/59.  She is taking Lasix 40mg  but took 30mg  last night to see if that would help.  She states the dizziness has been once when she was at the surgeon's office.  She noticed a dull headache for the past week above her eyes.  She has had the tiredness x 2 weeks approx.  She is taking Coreg 12.5mg  bid and Benzapril 10mg  bid.  She states she has not taken her Metoprolol x 6 months (only takes it when her heart is out of rhythm).  No sob.

## 2010-09-19 NOTE — Telephone Encounter (Signed)
Cut back to 20, none TODAY or tomorrow

## 2010-09-19 NOTE — Telephone Encounter (Signed)
Per Dr Eden Emms, pt to hold benzapril and contact her pcp.  Pt was notified.

## 2010-09-20 ENCOUNTER — Other Ambulatory Visit: Payer: Self-pay | Admitting: Internal Medicine

## 2010-09-26 ENCOUNTER — Telehealth: Payer: Self-pay | Admitting: *Deleted

## 2010-09-26 ENCOUNTER — Encounter: Payer: Self-pay | Admitting: *Deleted

## 2010-09-26 DIAGNOSIS — T82198A Other mechanical complication of other cardiac electronic device, initial encounter: Secondary | ICD-10-CM

## 2010-09-26 NOTE — Telephone Encounter (Signed)
This encounter was created in error - please disregard.

## 2010-09-26 NOTE — Telephone Encounter (Signed)
Checking lead 

## 2010-10-01 ENCOUNTER — Ambulatory Visit: Payer: Medicare Other

## 2010-10-01 ENCOUNTER — Ambulatory Visit (INDEPENDENT_AMBULATORY_CARE_PROVIDER_SITE_OTHER)
Admission: RE | Admit: 2010-10-01 | Discharge: 2010-10-01 | Disposition: A | Discharge status: Home / Self Care | Payer: PRIVATE HEALTH INSURANCE | Source: Ambulatory Visit | Attending: Internal Medicine | Admitting: Internal Medicine

## 2010-10-01 DIAGNOSIS — T82198A Other mechanical complication of other cardiac electronic device, initial encounter: Secondary | ICD-10-CM

## 2010-10-03 ENCOUNTER — Encounter: Payer: Self-pay | Admitting: Internal Medicine

## 2010-10-03 ENCOUNTER — Ambulatory Visit (INDEPENDENT_AMBULATORY_CARE_PROVIDER_SITE_OTHER): Payer: PRIVATE HEALTH INSURANCE | Admitting: Internal Medicine

## 2010-10-03 VITALS — BP 104/70 | HR 72 | Temp 98.2°F | Resp 16 | Ht 63.0 in | Wt 150.0 lb

## 2010-10-03 DIAGNOSIS — Z23 Encounter for immunization: Secondary | ICD-10-CM

## 2010-10-03 DIAGNOSIS — N182 Chronic kidney disease, stage 2 (mild): Secondary | ICD-10-CM

## 2010-10-03 DIAGNOSIS — E039 Hypothyroidism, unspecified: Secondary | ICD-10-CM

## 2010-10-03 DIAGNOSIS — I4891 Unspecified atrial fibrillation: Secondary | ICD-10-CM

## 2010-10-03 LAB — BASIC METABOLIC PANEL
BUN: 32 mg/dL — ABNORMAL HIGH (ref 6–23)
CO2: 28 mEq/L (ref 19–32)
Chloride: 107 mEq/L (ref 96–112)
Glucose, Bld: 66 mg/dL — ABNORMAL LOW (ref 70–99)
Potassium: 5.3 mEq/L — ABNORMAL HIGH (ref 3.5–5.1)

## 2010-10-03 NOTE — Progress Notes (Signed)
Subjective:    Patient ID: Sandra [name], female    DOB: Jul 06, 1932, 77 y.o.   MRN: [id number]  HPI The patient has been on steady for reduction of her cholesterol she is intolerant of any statin.  She has a recommendation from her managed care plan that she try fenofibrate. She is stable on medications We have tried turbinates in the past and she was unable to tolerate these and did not reach goal she has done best with the Zetia we will try to help her by supplementing Zetia samples. She has chronic hoarseness and has seen ENT     Review of Systems  Constitutional: Negative for activity change, appetite change and fatigue.  HENT: Negative for ear pain, congestion, neck pain, postnasal drip and sinus pressure.   Eyes: Negative for redness and visual disturbance.  Respiratory: Negative for cough, shortness of breath and wheezing.   Gastrointestinal: Negative for abdominal pain and abdominal distention.  Genitourinary: Negative for dysuria, frequency and menstrual problem.  Musculoskeletal: Negative for myalgias, joint swelling and arthralgias.  Skin: Negative for rash and wound.  Neurological: Negative for dizziness, weakness and headaches.  Hematological: Negative for adenopathy. Does not bruise/bleed easily.  Psychiatric/Behavioral: Negative for sleep disturbance and decreased concentration.   Past Medical History  Diagnosis Date  . Atrial fibrillation   . Hyperlipidemia   . Hypothyroidism   . Bradycardia   . Cardiomyopathy, hypertrophic nonobstructive   . Anal fissure   . Adenomatous colon polyp   . Diverticulosis   . AICD (automatic cardioverter/defibrillator) present   . Systolic heart failure   . History of colonoscopy   . Heart murmur    Past Surgical History  Procedure Date  . Cardioversion   . Cardiac defibrillator placement     reports that she has quit smoking. She does not have any smokeless tobacco history on file. She reports that she does not drink  alcohol or use illicit drugs. family history includes COPD in her father; Cancer in her father and paternal aunt; Cardiomyopathy in her daughter; and Heart disease in her father and mother. Allergies  Allergen Reactions  . Codeine     REACTION: nausea  . Niacin     REACTION: rash  . Procaine Hcl     REACTION: smothers  . Ramipril     REACTION: unspecified  . Statins     REACTION: muscle aches       Objective:   Physical Exam  Nursing note and vitals reviewed. Constitutional: She is oriented to person, place, and time. She appears well-developed and well-nourished. No distress.  HENT:  Head: Normocephalic and atraumatic.  Right Ear: External ear normal.  Left Ear: External ear normal.  Nose: Nose normal.  Mouth/Throat: Oropharynx is clear and moist.  Eyes: Conjunctivae and EOM are normal. Pupils are equal, round, and reactive to light.  Neck: Normal range of motion. Neck supple. No JVD present. No tracheal deviation present. No thyromegaly present.  Cardiovascular: Normal rate, regular rhythm, normal heart sounds and intact distal pulses.   No murmur heard. Pulmonary/Chest: Effort normal and breath sounds normal. She has no wheezes. She exhibits no tenderness.  Abdominal: Soft. Bowel sounds are normal.  Musculoskeletal: Normal range of motion. She exhibits no edema and no tenderness.  Lymphadenopathy:    She has no cervical adenopathy.  Neurological: She is alert and oriented to person, place, and time. She has normal reflexes. No cranial nerve deficit.  Skin: Skin is warm and dry. She is not  diaphoretic.  Psychiatric: She has a normal mood and affect. Her behavior is normal.          Assessment & Plan:  Patient has been intolerant of statins for hyperlipidemia she is also failed figure-of-eight in the past therefore we will continue the Zetia and try to help her with the cost of the medications as much possible.  Take her fibrillation has been stable Back pain is  stable monitoring of her renal function due today monitor TSH for hoarseness

## 2010-10-03 NOTE — Patient Instructions (Signed)
Same dose,  2.5mg  and 5 mg alternative check in 4 weeks

## 2010-10-06 ENCOUNTER — Telehealth: Payer: Self-pay | Admitting: *Deleted

## 2010-10-06 NOTE — Telephone Encounter (Signed)
That means that she has a very slight increase in her renal functions this is due to her medications and her age and it is not at all abnormal it is just a diagnosis

## 2010-10-06 NOTE — Telephone Encounter (Signed)
Pt saw State II kidney disease on her discharge papers, and is very upset.  Please explain.

## 2010-10-06 NOTE — Telephone Encounter (Signed)
Notified pt. 

## 2010-10-29 ENCOUNTER — Ambulatory Visit (INDEPENDENT_AMBULATORY_CARE_PROVIDER_SITE_OTHER): Payer: Medicare Other | Admitting: Internal Medicine

## 2010-10-29 DIAGNOSIS — I4891 Unspecified atrial fibrillation: Secondary | ICD-10-CM

## 2010-10-29 DIAGNOSIS — R300 Dysuria: Secondary | ICD-10-CM

## 2010-10-29 LAB — POCT URINALYSIS DIPSTICK
Bilirubin, UA: NEGATIVE
Glucose, UA: NEGATIVE
Nitrite, UA: NEGATIVE
Spec Grav, UA: 1.015

## 2010-10-29 NOTE — Patient Instructions (Signed)
  Latest dosing instructions   Total Sun Mon Tue Wed Thu Fri Sat   27.5 5 mg 2.5 mg 5 mg 2.5 mg 5 mg 2.5 mg 5 mg    (5 mg1) (5 mg0.5) (5 mg1) (5 mg0.5) (5 mg1) (5 mg0.5) (5 mg1)        

## 2010-10-31 ENCOUNTER — Ambulatory Visit: Payer: PRIVATE HEALTH INSURANCE

## 2010-11-10 ENCOUNTER — Telehealth: Payer: Self-pay | Admitting: Internal Medicine

## 2010-11-10 NOTE — Telephone Encounter (Signed)
Pt called she said she is out of rythmn she took metoprolol but still doesn't feel right please call

## 2010-11-10 NOTE — Telephone Encounter (Signed)
Spoke with DOD DR Tenny Craw, pt to take metoprolol 25 mg now, if still Afib in 6 hrs to take another 25 mg. In morning she is to call office and update, to be advised by Dr Graciela Husbands. Pt verbalized understanding. note forwarded to [facility] and Dr Graciela Husbands

## 2010-11-10 NOTE — Telephone Encounter (Signed)
Pt states she is out of rhythm, last night felt neck pressure and checked her bp 130/87 p 80-86, stated this is high for her, SOB with exersion. Last INR 3.5 regulated by Dr Lovell Sheehan, usual is 2.5 - 2.8 per pt it was elevated due to yeast infection has f/u app for inr. Pt took metoprolol 25mg  last night  And has not went back into rhythm yet, please advise.

## 2010-11-11 NOTE — Telephone Encounter (Signed)
I spoke with the patient today. She is not certain if she is in or out of rhythm. She states that on Saturday 10/20 she went on a trip with her church, when she got back, she was helping a friend carry something to the car. She started getting really SOB. She thinks she has been in & out of rhythm. She has been taking carvedilol 12.5mg  twice daily and Tikosyn 375 mcg twice daily. She took toprol 25mg  Sunday night. She took toprol 25mg  yesterday around 3pm and again last night. She feels "really bad" from the medication. She is uncertain now as to what to do with her medication. I explained I would review with Dr. Graciela Husbands and call her back. She is due for a device check on 11/19/10. Sherri Rad, RN, BSN  I reviewed the above with Dr. Graciela Husbands, orders received to have the patient discontinue carvedilol and start toprol 50mg  BID. I have explained this to her and she states that she gets very SOB on toprol. I explained I would review with Dr. Graciela Husbands again. Sherri Rad, RN, BSN   Per Dr. Graciela Husbands, take toprol 12.5mg  twice daily in addition to carvedilol 12.5mg  twice daily. The patient is aware of these recommendations. She is concerned about how the toprol will make her feel. She will try this until Friday and then call me back and let me know how she is doing. Sherri Rad, RN, BSN

## 2010-11-11 NOTE — Telephone Encounter (Signed)
Follow up message:  BP is 109/78 this am pulse is 80.  She thinks she may be in atrial fib/monitor does not show it.  Not as short of breath.  Please call her back regarding what to do next.

## 2010-11-14 ENCOUNTER — Telehealth: Payer: Self-pay | Admitting: Internal Medicine

## 2010-11-14 NOTE — Telephone Encounter (Signed)
Pt rtn call,  [phone]

## 2010-11-14 NOTE — Telephone Encounter (Signed)
Pt calling re doing what was told this week and not better, still has SOB, heart out of rhythm. Has another appt at 1215p, needs call

## 2010-11-14 NOTE — Telephone Encounter (Signed)
Heather--this is message from ms Petties--thanks nt

## 2010-11-14 NOTE — Telephone Encounter (Signed)
I spoke with Dr. Graciela Husbands regarding the patient's symptoms. He recommends stopping toprol and coming in as scheduled next week for her interrogation on 11/7. I have explained this to the patient and she is agreeable. I have advised if her SOB becomes any worse or she feels like her rates are rapid, she should report to the ER.

## 2010-11-19 ENCOUNTER — Encounter: Payer: Medicare Other | Admitting: *Deleted

## 2010-11-19 ENCOUNTER — Other Ambulatory Visit: Payer: Self-pay | Admitting: *Deleted

## 2010-11-19 ENCOUNTER — Ambulatory Visit (INDEPENDENT_AMBULATORY_CARE_PROVIDER_SITE_OTHER): Payer: Medicare Other | Admitting: *Deleted

## 2010-11-19 DIAGNOSIS — I4891 Unspecified atrial fibrillation: Secondary | ICD-10-CM

## 2010-11-19 DIAGNOSIS — Z95810 Presence of automatic (implantable) cardiac defibrillator: Secondary | ICD-10-CM

## 2010-11-19 DIAGNOSIS — I499 Cardiac arrhythmia, unspecified: Secondary | ICD-10-CM

## 2010-11-19 MED ORDER — METOPROLOL SUCCINATE ER 50 MG PO TB24: 50.0000 mg | ORAL_TABLET | Freq: Every day | ORAL | Status: DC

## 2010-11-19 NOTE — Progress Notes (Signed)
icd check in clinic  

## 2010-11-20 ENCOUNTER — Encounter: Payer: Self-pay | Admitting: Internal Medicine

## 2010-11-20 LAB — ICD DEVICE OBSERVATION
AL AMPLITUDE: 2.4 mv
AL IMPEDENCE ICD: 440 Ohm
AL THRESHOLD: 1 V
DEV-0020ICD: NEGATIVE
DEVICE MODEL ICD: 352754
LV LEAD IMPEDENCE ICD: 680 Ohm
LV LEAD THRESHOLD: 0.75 V
RV LEAD THRESHOLD: 1 V
TZAT-0001SLOWVT: 1
TZAT-0004SLOWVT: 8
TZAT-0012SLOWVT: 200 ms
TZON-0002FASTVT: 5
TZON-0010SLOWVT: 80 ms
TZST-0001FASTVT: 1
TZST-0001FASTVT: 2
TZST-0001FASTVT: 3
TZST-0001FASTVT: 5
TZST-0001SLOWVT: 3
TZST-0003FASTVT: 15 J
TZST-0003FASTVT: 36 J
TZST-0003FASTVT: 36 J
TZST-0003SLOWVT: 22.5 J
TZST-0003SLOWVT: 36 J
TZST-0003SLOWVT: 36 J

## 2010-12-03 ENCOUNTER — Ambulatory Visit: Payer: Medicare Other

## 2010-12-10 ENCOUNTER — Ambulatory Visit (INDEPENDENT_AMBULATORY_CARE_PROVIDER_SITE_OTHER): Payer: Medicare Other | Admitting: Internal Medicine

## 2010-12-10 DIAGNOSIS — Z7901 Long term (current) use of anticoagulants: Secondary | ICD-10-CM

## 2010-12-10 DIAGNOSIS — Z5181 Encounter for therapeutic drug level monitoring: Secondary | ICD-10-CM

## 2010-12-10 DIAGNOSIS — I4891 Unspecified atrial fibrillation: Secondary | ICD-10-CM

## 2010-12-10 LAB — POCT INR: INR: 3.6

## 2010-12-10 NOTE — Patient Instructions (Signed)
  Latest dosing instructions   Total Glynis Smiles Tue Wed Thu Fri Sat   25 5 mg 2.5 mg 2.5 mg 5 mg 2.5 mg 2.5 mg 5 mg    (5 mg1) (5 mg0.5) (5 mg0.5) (5 mg1) (5 mg0.5) (5 mg0.5) (5 mg1)

## 2010-12-12 ENCOUNTER — Encounter: Payer: Medicare Other | Admitting: Internal Medicine

## 2010-12-18 ENCOUNTER — Encounter: Payer: Self-pay | Admitting: Internal Medicine

## 2010-12-18 ENCOUNTER — Ambulatory Visit (INDEPENDENT_AMBULATORY_CARE_PROVIDER_SITE_OTHER): Payer: Medicare Other | Admitting: Internal Medicine

## 2010-12-18 DIAGNOSIS — I4891 Unspecified atrial fibrillation: Secondary | ICD-10-CM

## 2010-12-18 DIAGNOSIS — I421 Obstructive hypertrophic cardiomyopathy: Secondary | ICD-10-CM

## 2010-12-18 DIAGNOSIS — I509 Heart failure, unspecified: Secondary | ICD-10-CM

## 2010-12-18 DIAGNOSIS — I5022 Chronic systolic (congestive) heart failure: Secondary | ICD-10-CM | POA: Insufficient documentation

## 2010-12-18 LAB — ICD DEVICE OBSERVATION
BAMS-0001: 150 {beats}/min
BAMS-0003: 80 {beats}/min
CHARGE TIME: 12.5 s
DEV-0020ICD: NEGATIVE
DEVICE MODEL ICD: 352754
LV LEAD THRESHOLD: 0.75 V
MODE SWITCH EPISODES: 80
RV LEAD IMPEDENCE ICD: 355 Ohm
RV LEAD THRESHOLD: 1 V
TOT-0006: 20120801000000
TOT-0007: 1
TOT-0008: 0
TZAT-0001SLOWVT: 1
TZAT-0004SLOWVT: 8
TZAT-0012SLOWVT: 200 ms
TZAT-0013SLOWVT: 3
TZAT-0019SLOWVT: 7.5 V
TZON-0002FASTVT: 5
TZON-0003FASTVT: 300 ms
TZON-0004SLOWVT: 12
TZST-0001FASTVT: 2
TZST-0001FASTVT: 5
TZST-0001SLOWVT: 3
TZST-0003FASTVT: 15 J
TZST-0003FASTVT: 36 J
TZST-0003FASTVT: 36 J
TZST-0003SLOWVT: 22.5 J
TZST-0003SLOWVT: 36 J
TZST-0003SLOWVT: 36 J
VENTRICULAR PACING ICD: 99 pct

## 2010-12-18 MED ORDER — DOFETILIDE 250 MCG PO CAPS: 250.0000 ug | ORAL_CAPSULE | Freq: Two times a day (BID) | ORAL | Status: DC

## 2010-12-18 MED ORDER — DOFETILIDE 125 MCG PO CAPS: 125.0000 ug | ORAL_CAPSULE | Freq: Two times a day (BID) | ORAL | Status: DC

## 2010-12-18 MED ORDER — FUROSEMIDE 20 MG PO TABS: 20.0000 mg | ORAL_TABLET | Freq: Three times a day (TID) | ORAL | Status: DC

## 2010-12-18 MED ORDER — FUROSEMIDE 20 MG PO TABS: 40.0000 mg | ORAL_TABLET | Freq: Every day | ORAL | Status: DC

## 2010-12-18 MED ORDER — CARVEDILOL 12.5 MG PO TABS: 12.5000 mg | ORAL_TABLET | Freq: Two times a day (BID) | ORAL | Status: DC

## 2010-12-18 MED ORDER — METOPROLOL SUCCINATE ER 50 MG PO TB24: 50.0000 mg | ORAL_TABLET | Freq: Every day | ORAL | Status: DC

## 2010-12-18 NOTE — Progress Notes (Signed)
HPI  Sandra [name] is a 77 y.o. female  seen in followup for congestive heart failure in the setting of hypertrophic cardiomyopathy with depressed left ventricular function. She is status post CRT-D implantation. She is on Coumadin. Because of recurrences of atrial fibrillation for the fall and winter, she is also a put on dofetilide in February. She recently underwent repeat cardioversion because of recurrence of atrial fibrillation; her Tikosyn dose at that time was increased from 250-375 twice daily.she has had problems with recurrent atrial fibrillation. She has also had problems with recurrent heart failure manifested by dyspnea. It seems to be quite responsive to adjustments in her Lasix. It does not correlate specifically with her atrial fibrillation.  Her fluid accumulation seems to manifest primarily in her belly  Past Medical History  Diagnosis Date  . Atrial fibrillation   . Hyperlipidemia   . Hypothyroidism   . Bradycardia   . Cardiomyopathy, hypertrophic nonobstructive   . Anal fissure   . Adenomatous colon polyp   . Diverticulosis   . AICD (automatic cardioverter/defibrillator) present   . Systolic heart failure   . History of colonoscopy   . Heart murmur     Past Surgical History  Procedure Date  . Cardioversion   . Cardiac defibrillator placement     Current Outpatient Prescriptions  Medication Sig Dispense Refill  . benazepril (LOTENSIN) 10 MG tablet TAKE ONE TABLET BY MOUTH TWICE DAILY  180 tablet  0  . calcipotriene (DOVONOX) 0.005 % cream Apply topically 2 (two) times daily.        . clorazepate (TRANXENE) 7.5 MG tablet 2 (two) times daily as needed.       . dofetilide (TIKOSYN) 125 MCG capsule Take 1 capsule (125 mcg total) by mouth 2 (two) times daily.  60 capsule  6  . dofetilide (TIKOSYN) 250 MCG capsule Take 1 capsule (250 mcg total) by mouth 2 (two) times daily.  60 capsule  11  . hydrocortisone (ANUSOL-HC) 2.5 % rectal cream Place 1 application  rectally 2 (two) times daily.        . metoprolol (TOPROL-XL) 50 MG 24 hr tablet Take 1 tablet (50 mg total) by mouth daily.  30 tablet  1  . SYNTHROID 100 MCG tablet TAKE ONE TABLET BY MOUTH EVERY DAY  90 each  3  . vitamin E 400 UNIT capsule Take 400 Units by mouth daily.        Marland Kitchen warfarin (COUMADIN) 5 MG tablet Take 5 mg by mouth daily.        Marland Kitchen ZETIA 10 MG tablet TAKE ONE TABLET BY MOUTH EVERY DAY  90 each  0  . carvedilol (COREG) 12.5 MG tablet Take 1 tablet (12.5 mg total) by mouth 2 (two) times daily with a meal.  180 tablet  3  . furosemide (LASIX) 20 MG tablet Take 2 tablets (40 mg total) by mouth daily.  90 tablet  3    Allergies  Allergen Reactions  . Codeine     REACTION: nausea  . Niacin     REACTION: rash  . Procaine Hcl     REACTION: smothers  . Ramipril     REACTION: unspecified  . Statins     REACTION: muscle aches    Review of Systems negative except from HPI and PMH  Physical Exam Well developed and well nourished in no acute distress HENT normal E scleral and icterus clear Neck Supple JVP flat; carotids brisk and full Clear to ausculation Regular  rate and rhythm, no murmurs gallops or rub Soft with active bowel sounds No clubbing cyanosis none Edema Alert and oriented, grossly normal motor and sensory function Skin Warm and Dry  ECG demonstrates Biventricular pacing  Assessment and  Plan

## 2010-12-18 NOTE — Assessment & Plan Note (Signed)
She has exacerbation of her heart failure that seems to correlate with fluid accumulation. Is not clearly related to her atrial fibrillation. Because of that we'll continue her on her dofetilide for now and increase her diuretics from 40-60 mg a day if this doesn't seem to do the trick, we'll consider the addition of amiodarone

## 2010-12-18 NOTE — Patient Instructions (Signed)
Increase lasix to 20mg  3 times per day.

## 2010-12-18 NOTE — Assessment & Plan Note (Signed)
As above.

## 2010-12-18 NOTE — Assessment & Plan Note (Signed)
The patient's device was interrogated.  The information was reviewed. No changes were made in the programming.    

## 2010-12-23 ENCOUNTER — Telehealth: Payer: Self-pay | Admitting: Internal Medicine

## 2010-12-23 NOTE — Telephone Encounter (Signed)
New message:  Pt has a question regarding Metoprolol and the dosage.  Please call her regarding this medication and how she should take it.

## 2010-12-23 NOTE — Telephone Encounter (Signed)
I spoke with the patient. She states that her refill on her toprol stated to take one tablet by mouth once daily. She states that she has been on toprol as needed. She wanted to know was Dr. Graciela Husbands changing her dose because it makes her feel bad. I explained that it was reordered as once daily, but she should continue to take this as needed for break through a-fib, especially since she takes carvedilol twice daily. She also states that her lasix was increased to three times daily, and she wanted to know should she continue on that if she felt like her urine output was down. Dr. Graciela Husbands increased her dose due to SOB. I advised that she weigh daily and assess how her SOB is doing. If her weight is stable and her SOB is ok, but her urine output is down, she should cut back on the lasix so she does not become dehydrated. She voices understanding of the above.

## 2010-12-29 ENCOUNTER — Telehealth: Payer: Self-pay | Admitting: Internal Medicine

## 2010-12-29 DIAGNOSIS — I4891 Unspecified atrial fibrillation: Secondary | ICD-10-CM

## 2010-12-29 MED ORDER — DOFETILIDE 125 MCG PO CAPS: ORAL_CAPSULE | ORAL | Status: DC

## 2010-12-29 NOTE — Telephone Encounter (Signed)
New msg Pt called she wants to get rx for tikosyn for 125 mg for 90 day supply so it would be cheaper copy for her insurance please call her back and let her know if this would be ok

## 2010-12-29 NOTE — Telephone Encounter (Signed)
I spoke with the patient. She would like have a prescription for Tikosyn for three tablets twice daily instead of taking 125 mcg & 250 mcg tablets one of each twice daily. I explained we could do this for her. She would like a #90 day supply mailed to her.

## 2011-01-05 ENCOUNTER — Encounter: Payer: Self-pay | Admitting: Internal Medicine

## 2011-01-08 ENCOUNTER — Ambulatory Visit (INDEPENDENT_AMBULATORY_CARE_PROVIDER_SITE_OTHER): Payer: Medicare Other | Admitting: Internal Medicine

## 2011-01-08 DIAGNOSIS — I4891 Unspecified atrial fibrillation: Secondary | ICD-10-CM

## 2011-01-08 LAB — POCT INR: INR: 1.9

## 2011-01-08 NOTE — Patient Instructions (Signed)
  Latest dosing instructions   Total Sun Mon Tue Wed Thu Fri Sat   25 5 mg 2.5 mg 2.5 mg 5 mg 2.5 mg 2.5 mg 5 mg    (5 mg1) (5 mg0.5) (5 mg0.5) (5 mg1) (5 mg0.5) (5 mg0.5) (5 mg1)        

## 2011-01-30 ENCOUNTER — Ambulatory Visit (INDEPENDENT_AMBULATORY_CARE_PROVIDER_SITE_OTHER): Payer: Medicare Other | Admitting: Internal Medicine

## 2011-01-30 ENCOUNTER — Encounter: Payer: Self-pay | Admitting: Internal Medicine

## 2011-01-30 VITALS — BP 110/74 | HR 60 | Temp 98.2°F | Ht 63.0 in | Wt 150.0 lb

## 2011-01-30 DIAGNOSIS — I4891 Unspecified atrial fibrillation: Secondary | ICD-10-CM

## 2011-01-30 DIAGNOSIS — N289 Disorder of kidney and ureter, unspecified: Secondary | ICD-10-CM

## 2011-01-30 DIAGNOSIS — F411 Generalized anxiety disorder: Secondary | ICD-10-CM

## 2011-01-30 DIAGNOSIS — F419 Anxiety disorder, unspecified: Secondary | ICD-10-CM

## 2011-01-30 DIAGNOSIS — E039 Hypothyroidism, unspecified: Secondary | ICD-10-CM

## 2011-01-30 LAB — BASIC METABOLIC PANEL
BUN: 26 mg/dL — ABNORMAL HIGH (ref 6–23)
Calcium: 9.1 mg/dL (ref 8.4–10.5)
Chloride: 104 mEq/L (ref 96–112)
Creatinine, Ser: 1.4 mg/dL — ABNORMAL HIGH (ref 0.4–1.2)
GFR: 38.13 mL/min — ABNORMAL LOW (ref >60.00)

## 2011-01-30 LAB — TSH: TSH: 0.22 u[IU]/mL — ABNORMAL LOW (ref 0.35–5.50)

## 2011-01-30 MED ORDER — EZETIMIBE 10 MG PO TABS: 10.0000 mg | ORAL_TABLET | Freq: Every day | ORAL | Status: DC

## 2011-01-30 MED ORDER — CLORAZEPATE DIPOTASSIUM 7.5 MG PO TABS: 7.5000 mg | ORAL_TABLET | Freq: Two times a day (BID) | ORAL | Status: DC | PRN

## 2011-01-30 NOTE — Patient Instructions (Addendum)
Dicussed weight gain of fluid If there is 3 pounds of weight gain in one day with symptoms of SOB Then take extra lasix in the afternoon    Latest dosing instructions   Total Sun Mon Tue Wed Thu Fri Sat   25 5 mg 2.5 mg 2.5 mg 5 mg 2.5 mg 2.5 mg 5 mg    (5 mg1) (5 mg0.5) (5 mg0.5) (5 mg1) (5 mg0.5) (5 mg0.5) (5 mg1)

## 2011-01-30 NOTE — Progress Notes (Signed)
Addended by: Rita Ohara R on: 01/30/2011 10:56 AM   Modules accepted: Orders

## 2011-01-30 NOTE — Progress Notes (Signed)
Subjective:    Patient ID: Sandra [name], female    DOB: 08-Nov-1932, 78 y.o.   MRN: [id number]  HPI Increased stress, husbands chronic pain has caused stress. Her HOCM is stable and she reported no new concerns Follow up for hypothyroidism and hyperlipemia Monitoring chronic kidney disease.    Review of Systems  Constitutional: Negative for activity change, appetite change and fatigue.  HENT: Negative for ear pain, congestion, neck pain, postnasal drip and sinus pressure.   Eyes: Negative for redness and visual disturbance.  Respiratory: Negative for cough, shortness of breath and wheezing.   Gastrointestinal: Negative for abdominal pain and abdominal distention.  Genitourinary: Negative for dysuria, frequency and menstrual problem.  Musculoskeletal: Negative for myalgias, joint swelling and arthralgias.  Skin: Negative for rash and wound.  Neurological: Negative for dizziness, weakness and headaches.  Hematological: Negative for adenopathy. Does not bruise/bleed easily.  Psychiatric/Behavioral: Negative for sleep disturbance and decreased concentration.   Past Medical History  Diagnosis Date  . Atrial fibrillation   . Hyperlipidemia   . Hypothyroidism   . Bradycardia   . Cardiomyopathy, hypertrophic nonobstructive     ejection fraction 25%  . Anal fissure   . Adenomatous colon polyp   . Diverticulosis   . AICD (automatic cardioverter/defibrillator) present   . Systolic heart failure   . History of colonoscopy   . Heart murmur     History   Social History  . Marital Status: Married    Spouse Name: N/A    Number of Children: N/A  . Years of Education: N/A   Occupational History  . Retired    Social History Main Topics  . Smoking status: Former Games developer  . Smokeless tobacco: Not on file  . Alcohol Use: No  . Drug Use: No  . Sexually Active: Not Currently   Other Topics Concern  . Not on file   Social History Narrative  . No narrative on file    Past  Surgical History  Procedure Date  . Cardioversion   . Cardiac defibrillator placement     Family History  Problem Relation Age of Onset  . Heart disease Mother   . Cancer Father     colon  . COPD Father   . Heart disease Father   . Cardiomyopathy Daughter   . Cancer Paternal Aunt     BREAST    Allergies  Allergen Reactions  . Codeine     REACTION: nausea  . Niacin     REACTION: rash  . Procaine Hcl     REACTION: smothers  . Ramipril     REACTION: unspecified  . Statins     REACTION: muscle aches    Current Outpatient Prescriptions on File Prior to Visit  Medication Sig Dispense Refill  . benazepril (LOTENSIN) 10 MG tablet TAKE ONE TABLET BY MOUTH TWICE DAILY  180 tablet  0  . calcipotriene (DOVONOX) 0.005 % cream Apply topically 2 (two) times daily.        . carvedilol (COREG) 12.5 MG tablet Take 1 tablet (12.5 mg total) by mouth 2 (two) times daily with a meal.  180 tablet  3  . clorazepate (TRANXENE) 7.5 MG tablet 2 (two) times daily as needed.       . dofetilide (TIKOSYN) 125 MCG capsule Take three capsules by mouth twice daily  540 capsule  3  . furosemide (LASIX) 20 MG tablet Take 1 tablet (20 mg total) by mouth 3 (three) times daily.  270 tablet  3  . hydrocortisone (ANUSOL-HC) 2.5 % rectal cream Place 1 application rectally 2 (two) times daily.        . metoprolol (TOPROL-XL) 50 MG 24 hr tablet Take 1 tablet (50 mg total) by mouth daily.  90 tablet  3  . SYNTHROID 100 MCG tablet TAKE ONE TABLET BY MOUTH EVERY DAY  90 each  3  . vitamin E 400 UNIT capsule Take 400 Units by mouth daily.        Marland Kitchen warfarin (COUMADIN) 5 MG tablet Take 5 mg by mouth daily.        Marland Kitchen ZETIA 10 MG tablet TAKE ONE TABLET BY MOUTH EVERY DAY  90 each  0    BP 110/74  Pulse 60  Temp(Src) 98.2 F (36.8 C) (Oral)  Wt 150 lb (68.04 kg)       Objective:   Physical Exam  Nursing note and vitals reviewed. Constitutional: She is oriented to person, place, and time. She appears  well-developed and well-nourished. No distress.  HENT:  Head: Normocephalic and atraumatic.  Right Ear: External ear normal.  Left Ear: External ear normal.  Nose: Nose normal.  Mouth/Throat: Oropharynx is clear and moist.  Eyes: Conjunctivae and EOM are normal. Pupils are equal, round, and reactive to light.  Neck: Normal range of motion. Neck supple. No JVD present. No tracheal deviation present. No thyromegaly present.  Cardiovascular: Normal rate, regular rhythm, normal heart sounds and intact distal pulses.   No murmur heard. Pulmonary/Chest: Effort normal and breath sounds normal. She has no wheezes. She exhibits no tenderness.  Abdominal: Soft. Bowel sounds are normal.  Musculoskeletal: Normal range of motion. She exhibits no edema and no tenderness.  Lymphadenopathy:    She has no cervical adenopathy.  Neurological: She is alert and oriented to person, place, and time. She has normal reflexes. No cranial nerve deficit.  Skin: Skin is warm and dry. She is not diaphoretic.  Psychiatric: She has a normal mood and affect. Her behavior is normal.          Assessment & Plan:  Monitoring him for heart failure, Atrial fib and hypothyroidism. Monitor renal functions Discussed need for counseling to deal with husband. She is in AF about 20 percent of the time. Reviewed the need to increase the lasix by monitoring weight.

## 2011-02-03 ENCOUNTER — Telehealth: Payer: Self-pay | Admitting: Internal Medicine

## 2011-02-03 NOTE — Telephone Encounter (Signed)
I will forward to Dr. Graciela Husbands to see how this would work based on the patient's history.

## 2011-02-03 NOTE — Telephone Encounter (Signed)
New Msg: Pt cousin calling to speak with nurse to set up lab work for genetic testing. Please return pt cousin call to discuss further.

## 2011-02-04 NOTE — Telephone Encounter (Signed)
First degree relatives would be the first group to test,  Cousin is reasonable also as it will tell us which of mrs Barrows siblings carried the gene this is an alternative for her siblings thnaks We just have to do the request as we did for her grandson etca

## 2011-02-05 NOTE — Telephone Encounter (Signed)
I spoke with the patient's cousin and explained to him that Dr. Graciela Husbands would order the genetic test for him. I explained that [employer] can charge up to $500 for the test out of pocket. I called [employer] and they stated that we could fill out the requisition form and have the patient and MD sign and return with copies of the patient's insurance cards. They will send to the precert dept and contact the patient's insurance. They will then be in touch with the patient about coverage for this test. The patient is aware I will mail paperwork for him to fill out and send back to [place].  His address is: [address]  [place], [place] [address]

## 2011-02-05 NOTE — Telephone Encounter (Signed)
I left a message for the patient's cousin to call.

## 2011-02-11 ENCOUNTER — Ambulatory Visit: Payer: Medicare Other

## 2011-03-02 ENCOUNTER — Telehealth: Payer: Self-pay | Admitting: *Deleted

## 2011-03-02 ENCOUNTER — Ambulatory Visit: Payer: Medicare Other

## 2011-03-02 NOTE — Telephone Encounter (Signed)
Pt is having diarrhea, and coughing all night. She has Zithromax  500mg  #3,and wants to know if she can take it.  Also, would like a refill on Hydromet.  [employer].

## 2011-03-02 NOTE — Telephone Encounter (Signed)
Given Dr. Lovell Sheehan" medications.

## 2011-03-02 NOTE — Telephone Encounter (Signed)
Per dr Lovell Sheehan- with diarrhea , the zithromycine may cause more stomach upset- so he would like you to go on clear liquid diet for 24 hours and try delsym otc for cough

## 2011-03-03 ENCOUNTER — Other Ambulatory Visit: Payer: Self-pay | Admitting: Internal Medicine

## 2011-03-03 NOTE — Telephone Encounter (Signed)
Pt informed

## 2011-03-03 NOTE — Telephone Encounter (Signed)
Delsym is not working,and she wants the Hydromet and to begin the Zithromax, please.

## 2011-03-04 ENCOUNTER — Ambulatory Visit (INDEPENDENT_AMBULATORY_CARE_PROVIDER_SITE_OTHER): Payer: Medicare Other | Admitting: Internal Medicine

## 2011-03-04 ENCOUNTER — Ambulatory Visit: Payer: Medicare Other

## 2011-03-04 DIAGNOSIS — I4891 Unspecified atrial fibrillation: Secondary | ICD-10-CM

## 2011-03-04 NOTE — Patient Instructions (Signed)
  Latest dosing instructions   Total Sun Mon Tue Wed Thu Fri Sat   25 5 mg 2.5 mg 2.5 mg 5 mg 2.5 mg 2.5 mg 5 mg    (5 mg1) (5 mg0.5) (5 mg0.5) (5 mg1) (5 mg0.5) (5 mg0.5) (5 mg1)        

## 2011-03-19 ENCOUNTER — Other Ambulatory Visit: Payer: Self-pay | Admitting: Internal Medicine

## 2011-03-20 ENCOUNTER — Ambulatory Visit (INDEPENDENT_AMBULATORY_CARE_PROVIDER_SITE_OTHER): Payer: Medicare Other | Admitting: Internal Medicine

## 2011-03-20 ENCOUNTER — Encounter: Payer: Self-pay | Admitting: Internal Medicine

## 2011-03-20 ENCOUNTER — Telehealth: Payer: Self-pay | Admitting: Physician Assistant

## 2011-03-20 ENCOUNTER — Encounter: Payer: Self-pay | Admitting: *Deleted

## 2011-03-20 DIAGNOSIS — Z95810 Presence of automatic (implantable) cardiac defibrillator: Secondary | ICD-10-CM

## 2011-03-20 DIAGNOSIS — I421 Obstructive hypertrophic cardiomyopathy: Secondary | ICD-10-CM

## 2011-03-20 DIAGNOSIS — I4891 Unspecified atrial fibrillation: Secondary | ICD-10-CM

## 2011-03-20 DIAGNOSIS — I5022 Chronic systolic (congestive) heart failure: Secondary | ICD-10-CM

## 2011-03-20 LAB — ICD DEVICE OBSERVATION
AL AMPLITUDE: 1.8 mv
AL IMPEDENCE ICD: 440 Ohm
AL THRESHOLD: 0.75 V
BAMS-0003: 80 {beats}/min
BATTERY VOLTAGE: 2.48 V
HV IMPEDENCE: 34 Ohm
LV LEAD IMPEDENCE ICD: 360 Ohm
MODE SWITCH EPISODES: 4
RV LEAD AMPLITUDE: 11 mv
RV LEAD IMPEDENCE ICD: 365 Ohm
TOT-0006: 20121206000000
TOT-0009: 1
TOT-0010: 46
TZAT-0018SLOWVT: NEGATIVE
TZAT-0019SLOWVT: 7.5 V
TZAT-0020SLOWVT: 1 ms
TZON-0003SLOWVT: 350 ms
TZON-0004FASTVT: 12
TZON-0004SLOWVT: 12
TZON-0005FASTVT: 6
TZON-0010SLOWVT: 80 ms
TZST-0001FASTVT: 1
TZST-0001FASTVT: 2
TZST-0001FASTVT: 4
TZST-0001SLOWVT: 2
TZST-0003FASTVT: 25 J
TZST-0003SLOWVT: 36 J

## 2011-03-20 LAB — CBC WITH DIFFERENTIAL/PLATELET
Eosinophils Relative: 3 % (ref 0–5)
HCT: 38.9 % (ref 36.0–46.0)
Hemoglobin: 12.3 g/dL (ref 12.0–15.0)
Lymphocytes Relative: 23 % (ref 12–46)
MCHC: 31.6 g/dL (ref 30.0–36.0)
MCV: 88.6 fL (ref 78.0–100.0)
Monocytes Absolute: 0.7 10*3/uL (ref 0.1–1.0)
Monocytes Relative: 10 % (ref 3–12)
Neutro Abs: 4.4 10*3/uL (ref 1.7–7.7)

## 2011-03-20 LAB — BASIC METABOLIC PANEL
BUN: 25 mg/dL — ABNORMAL HIGH (ref 6–23)
Chloride: 105 mEq/L (ref 96–112)
Creat: 1.57 mg/dL — ABNORMAL HIGH (ref 0.50–1.10)
Glucose, Bld: 74 mg/dL (ref 70–99)
Potassium: 4 mEq/L (ref 3.5–5.3)

## 2011-03-20 LAB — PROTIME-INR: INR: 1.83 — ABNORMAL HIGH (ref <1.50)

## 2011-03-20 MED ORDER — CARVEDILOL 12.5 MG PO TABS: 12.5000 mg | ORAL_TABLET | Freq: Two times a day (BID) | ORAL | Status: DC

## 2011-03-20 NOTE — Assessment & Plan Note (Signed)
The patient's device is reached ERI. We'll undertake generator replacement. We will need to fluoroscope the lead to see whether the Riata is stable. In the event that is broken, and the vein is open, we will place a new defibrillator lead. Otherwise we will simply replace the generator.  The patient is also asked that we move the device a cephalad. She understands the risk of infection

## 2011-03-20 NOTE — Assessment & Plan Note (Signed)
Infrequent episodes 

## 2011-03-20 NOTE — Progress Notes (Signed)
HPI  Sandra [name] is a 79 y.o. female  seen in followup for congestive heart failure in the setting of hypertrophic cardiomyopathy with depressed left ventricular function. She is status post CRT-D implantation. The procedure was complicated by hematoma that required evacuation as well as a retained suture resulting in some cutaneous fluid collection. I do not have records of this but the patient recalls    She is on Coumadin. Because of recurrences of atrial fibrillation for the fall and winter, she is also a put on dofetilide in February. She recently underwent repeat cardioversion because of recurrence of atrial fibrillation; her Tikosyn dose at that time was increased from 250-375 twice daily.she has had problems with recurrent atrial fibrillation.    She has also had problems with recurrent heart failure manifested by dyspnea. This is relatively stable at the present time.  She has not had edema or chest pain.  Her device has reached ERI    Past Medical History  Diagnosis Date  . Atrial fibrillation   . Hyperlipidemia   . Hypothyroidism   . Bradycardia   . Cardiomyopathy, hypertrophic nonobstructive     ejection fraction 25%  . Anal fissure   . Adenomatous colon polyp   . Diverticulosis   . AICD (automatic cardioverter/defibrillator) present   . Systolic heart failure   . History of colonoscopy   . Heart murmur     Past Surgical History  Procedure Date  . Cardioversion   . Cardiac defibrillator placement     Current Outpatient Prescriptions  Medication Sig Dispense Refill  . benazepril (LOTENSIN) 10 MG tablet TAKE ONE TABLET BY MOUTH TWICE DAILY  180 tablet  3  . calcipotriene (DOVONOX) 0.005 % cream Apply topically 2 (two) times daily.        . carvedilol (COREG) 12.5 MG tablet Take 1 tablet (12.5 mg total) by mouth 2 (two) times daily with a meal.  180 tablet  3  . clorazepate (TRANXENE) 7.5 MG tablet Take 1 tablet (7.5 mg total) by mouth 2 (two) times daily  as needed.  60 tablet  3  . dofetilide (TIKOSYN) 125 MCG capsule Take three capsules by mouth twice daily  540 capsule  3  . ezetimibe (ZETIA) 10 MG tablet Take 1 tablet (10 mg total) by mouth daily.  90 tablet  3  . furosemide (LASIX) 20 MG tablet Take 1 tablet (20 mg total) by mouth 3 (three) times daily.  270 tablet  3  . HYDROcodone-homatropine (HYCODAN) 5-1.5 MG/5ML syrup TAKE ONE TEASPOONFUL BY MOUTH TWICE DAILY AS NEEDED FOR COUGH  180 mL  1  . hydrocortisone (ANUSOL-HC) 2.5 % rectal cream Place 1 application rectally 2 (two) times daily.        . metoprolol (TOPROL-XL) 50 MG 24 hr tablet Take 1 tablet (50 mg total) by mouth daily.  90 tablet  3  . SYNTHROID 100 MCG tablet TAKE ONE TABLET BY MOUTH EVERY DAY  90 each  3  . vitamin E 400 UNIT capsule Take 400 Units by mouth daily.        . warfarin (COUMADIN) 5 MG tablet AS DIRECTED  90 tablet  3    Allergies  Allergen Reactions  . Codeine     REACTION: nausea  . Niacin     REACTION: rash  . Procaine Hcl     REACTION: smothers  . Ramipril     REACTION: unspecified  . Statins     REACTION: muscle aches      Review of Systems negative except from HPI and PMH  Physical Exam Well developed and well nourished in no acute distress HENT normal E scleral and icterus clear Neck Supple JVP flat; carotids brisk and full Clear to ausculation Device pocket is healed with atrophy the device has migrated caudally Regular rate and rhythm, no murmurs gallops or rub Soft with active bowel sounds No clubbing cyanosis none Edema Alert and oriented, grossly normal motor and sensory function Skin Warm and Dry  ECG demonstrates Biventricular pacing  Assessment and  Plan  

## 2011-03-20 NOTE — Assessment & Plan Note (Signed)
Stable

## 2011-03-20 NOTE — Patient Instructions (Signed)
Your physician has recommended that you have a defibrillator generator change. Please see the instruction sheet given to you today for more information.     

## 2011-03-20 NOTE — Telephone Encounter (Signed)
Received call from [facility] labs re: labs in prep for gen change. INR 1.83, subtherapeutic, discussed with Dr. Gala Romney - will forward to Dr. Lovell Sheehan who has been managing patient's Coumadin.  Cr is also slightly elevated but has been in the past, K is normal. Will also forward to Dr. Graciela Husbands.  Scotti Kosta PA-C

## 2011-03-22 MED ORDER — SODIUM CHLORIDE 0.9 % IR SOLN
80.0000 mg | Status: AC
  Filled 2011-03-22: qty 2

## 2011-03-22 MED ORDER — CEFAZOLIN SODIUM 1-5 GM-% IV SOLN: 1.0000 g | INTRAVENOUS | Status: AC

## 2011-03-23 ENCOUNTER — Encounter (HOSPITAL_COMMUNITY)
Admission: RE | Disposition: A | Discharge status: Home / Self Care | Payer: Self-pay | Source: Ambulatory Visit | Attending: Internal Medicine

## 2011-03-23 ENCOUNTER — Ambulatory Visit (HOSPITAL_COMMUNITY)
Admission: RE | Admit: 2011-03-23 | Discharge: 2011-03-23 | Disposition: A | Discharge status: Home / Self Care | Payer: Medicare Other | Source: Ambulatory Visit | Attending: Internal Medicine | Admitting: Internal Medicine

## 2011-03-23 DIAGNOSIS — Z7901 Long term (current) use of anticoagulants: Secondary | ICD-10-CM | POA: Insufficient documentation

## 2011-03-23 DIAGNOSIS — I5020 Unspecified systolic (congestive) heart failure: Secondary | ICD-10-CM | POA: Insufficient documentation

## 2011-03-23 DIAGNOSIS — T82198A Other mechanical complication of other cardiac electronic device, initial encounter: Secondary | ICD-10-CM

## 2011-03-23 DIAGNOSIS — Z4502 Encounter for adjustment and management of automatic implantable cardiac defibrillator: Secondary | ICD-10-CM | POA: Insufficient documentation

## 2011-03-23 DIAGNOSIS — I5022 Chronic systolic (congestive) heart failure: Secondary | ICD-10-CM

## 2011-03-23 DIAGNOSIS — I4891 Unspecified atrial fibrillation: Secondary | ICD-10-CM | POA: Insufficient documentation

## 2011-03-23 DIAGNOSIS — I428 Other cardiomyopathies: Secondary | ICD-10-CM | POA: Insufficient documentation

## 2011-03-23 DIAGNOSIS — I421 Obstructive hypertrophic cardiomyopathy: Secondary | ICD-10-CM | POA: Insufficient documentation

## 2011-03-23 DIAGNOSIS — Z95810 Presence of automatic (implantable) cardiac defibrillator: Secondary | ICD-10-CM | POA: Insufficient documentation

## 2011-03-23 DIAGNOSIS — E039 Hypothyroidism, unspecified: Secondary | ICD-10-CM | POA: Insufficient documentation

## 2011-03-23 DIAGNOSIS — I509 Heart failure, unspecified: Secondary | ICD-10-CM | POA: Insufficient documentation

## 2011-03-23 HISTORY — PX: BIV ICD GENERTAOR CHANGE OUT: SHX5745

## 2011-03-23 SURGERY — BIV ICD GENERTAOR CHANGE OUT
Anesthesia: LOCAL

## 2011-03-23 MED ORDER — SODIUM CHLORIDE 0.9 % IV SOLN
INTRAVENOUS | Status: DC
  Administered 2011-03-23: 10:00:00 via INTRAVENOUS

## 2011-03-23 MED ORDER — CEFAZOLIN SODIUM 1-5 GM-% IV SOLN
INTRAVENOUS | Status: AC
  Filled 2011-03-23: qty 50

## 2011-03-23 MED ORDER — FENTANYL CITRATE 0.05 MG/ML IJ SOLN
INTRAMUSCULAR | Status: AC
  Filled 2011-03-23: qty 2

## 2011-03-23 MED ORDER — MIDAZOLAM HCL 5 MG/5ML IJ SOLN
INTRAMUSCULAR | Status: AC
  Filled 2011-03-23: qty 5

## 2011-03-23 MED ORDER — MUPIROCIN 2 % EX OINT
TOPICAL_OINTMENT | Freq: Two times a day (BID) | CUTANEOUS | Status: DC
  Administered 2011-03-23: 10:00:00 via NASAL

## 2011-03-23 MED ORDER — SODIUM CHLORIDE 0.45 % IV SOLN: INTRAVENOUS | Status: DC

## 2011-03-23 MED ORDER — CHLORHEXIDINE GLUCONATE 4 % EX LIQD
60.0000 mL | Freq: Once | CUTANEOUS | Status: DC
  Filled 2011-03-23: qty 60

## 2011-03-23 MED ORDER — MUPIROCIN 2 % EX OINT
TOPICAL_OINTMENT | CUTANEOUS | Status: AC
  Filled 2011-03-23: qty 22

## 2011-03-23 MED ORDER — LIDOCAINE HCL (PF) 1 % IJ SOLN
INTRAMUSCULAR | Status: AC
  Filled 2011-03-23: qty 60

## 2011-03-23 NOTE — H&P (View-Only) (Signed)
HPI  Sandra [name] is a 78 y.o. female  seen in followup for congestive heart failure in the setting of hypertrophic cardiomyopathy with depressed left ventricular function. She is status post CRT-D implantation. The procedure was complicated by hematoma that required evacuation as well as a retained suture resulting in some cutaneous fluid collection. I do not have records of this but the patient recalls    She is on Coumadin. Because of recurrences of atrial fibrillation for the fall and winter, she is also a put on dofetilide in February. She recently underwent repeat cardioversion because of recurrence of atrial fibrillation; her Tikosyn dose at that time was increased from 250-375 twice daily.she has had problems with recurrent atrial fibrillation.    She has also had problems with recurrent heart failure manifested by dyspnea. This is relatively stable at the present time.  She has not had edema or chest pain.  Her device has reached ERI    Past Medical History  Diagnosis Date  . Atrial fibrillation   . Hyperlipidemia   . Hypothyroidism   . Bradycardia   . Cardiomyopathy, hypertrophic nonobstructive     ejection fraction 25%  . Anal fissure   . Adenomatous colon polyp   . Diverticulosis   . AICD (automatic cardioverter/defibrillator) present   . Systolic heart failure   . History of colonoscopy   . Heart murmur     Past Surgical History  Procedure Date  . Cardioversion   . Cardiac defibrillator placement     Current Outpatient Prescriptions  Medication Sig Dispense Refill  . benazepril (LOTENSIN) 10 MG tablet TAKE ONE TABLET BY MOUTH TWICE DAILY  180 tablet  3  . calcipotriene (DOVONOX) 0.005 % cream Apply topically 2 (two) times daily.        . carvedilol (COREG) 12.5 MG tablet Take 1 tablet (12.5 mg total) by mouth 2 (two) times daily with a meal.  180 tablet  3  . clorazepate (TRANXENE) 7.5 MG tablet Take 1 tablet (7.5 mg total) by mouth 2 (two) times daily  as needed.  60 tablet  3  . dofetilide (TIKOSYN) 125 MCG capsule Take three capsules by mouth twice daily  540 capsule  3  . ezetimibe (ZETIA) 10 MG tablet Take 1 tablet (10 mg total) by mouth daily.  90 tablet  3  . furosemide (LASIX) 20 MG tablet Take 1 tablet (20 mg total) by mouth 3 (three) times daily.  270 tablet  3  . HYDROcodone-homatropine (HYCODAN) 5-1.5 MG/5ML syrup TAKE ONE TEASPOONFUL BY MOUTH TWICE DAILY AS NEEDED FOR COUGH  180 mL  1  . hydrocortisone (ANUSOL-HC) 2.5 % rectal cream Place 1 application rectally 2 (two) times daily.        . metoprolol (TOPROL-XL) 50 MG 24 hr tablet Take 1 tablet (50 mg total) by mouth daily.  90 tablet  3  . SYNTHROID 100 MCG tablet TAKE ONE TABLET BY MOUTH EVERY DAY  90 each  3  . vitamin E 400 UNIT capsule Take 400 Units by mouth daily.        Marland Kitchen warfarin (COUMADIN) 5 MG tablet AS DIRECTED  90 tablet  3    Allergies  Allergen Reactions  . Codeine     REACTION: nausea  . Niacin     REACTION: rash  . Procaine Hcl     REACTION: smothers  . Ramipril     REACTION: unspecified  . Statins     REACTION: muscle aches  Review of Systems negative except from HPI and PMH  Physical Exam Well developed and well nourished in no acute distress HENT normal E scleral and icterus clear Neck Supple JVP flat; carotids brisk and full Clear to ausculation Device pocket is healed with atrophy the device has migrated caudally Regular rate and rhythm, no murmurs gallops or rub Soft with active bowel sounds No clubbing cyanosis none Edema Alert and oriented, grossly normal motor and sensory function Skin Warm and Dry  ECG demonstrates Biventricular pacing  Assessment and  Plan

## 2011-03-23 NOTE — Brief Op Note (Signed)
03/23/2011  2:21 PM  PATIENT:  Sandra [name]  78 y.o. female  PRE-OPERATIVE DIAGNOSIS: caudal migration of ICD with generator at end of life; RIATA lead POST-OPERATIVE DIAGNOSIS:  caudal migration of ICD with generator at end of life; RIATA lea  PROCEDURE:  Procedure(s) (LRB): BIV ICD GENERTAOR CHANGE OUT (N/A); flourscopy of RIATA lead. Pocket revision  SURGEON:  Surgeon(s) and Role:    * Duke Salvia, MD - Primary  PHYSICIAN ASSISTANT:   ASSISTANTS: none   ANESTHESIA:   local and IV sedation   DICTATION: .Other Dictation: Dictation Number [id number]

## 2011-03-23 NOTE — Discharge Instructions (Signed)
Pacemaker Battery Change A pacemaker battery usually lasts 4 to 12 years. Once or twice per year, you will be asked to visit your caregiver to have a full evaluation of your pacemaker. When a battery needs to be replaced, the entire pacemaker is actually replaced so that you can benefit from new circuitry and any new features that have recently been added to pacemakers. Most often, this procedure is very simple because the leads are already in place. After giving medicine to numb the skin, your health care provider makes a cut to reopen the pocket holding the pacemaker and disconnects the old device from its leads. The leads are routinely tested at this time. If they are working okay, the new pacemaker may simply be connected to the existing leads. If there is any problem with the old lead system, it may be wise to replace the lead system while inserting the new pacemaker. There are many things that affect how long a pacemaker battery will last:  Age of the pacemaker.   Number of leads (1, 2 or 3).   Pacemaker work load. If the pacemaker is helping the heart more often, then the battery will not last as long as if the pacemaker does not need to help the heart.   Resistance of the leads. The greater the resistance, the greater the drain on the battery. This can increase as the leads get older or if one or more of the leads does not have the best contact with the heart.   Power (voltage) settings.   The health of the person's heart. If the health of the heart gets worse, then the pacemaker may have to work more often and the setting changed to accommodate these changes.  Your health care provider will be alerted to the fact that it is time to replace the battery during follow-up exams. He or she will check your pacemaker using a small table-top computer, called a programmer, and a wand. The wand is about the same size as a remote control. Your provider puts the wand on your body in the area where the  pacemaker is located. Information from the pacemaker is received about how well your heart is working and the status of the battery. It is not painful, and it usually takes just a few minutes. You will have plenty of time before the battery is fully used up to plan for replacement.  LET YOUR CAREGIVER KNOW ABOUT:   Symptoms of chest pain, trouble breathing, palpitations, lightheadedness, or feelings of an abnormal or irregular heart beat.   Allergies.   Medications taken including herbs, eye drops, over the counter medications, and creams   Use of steroids (by mouth or creams).   Possible pregnancy, if applicable.   Previous problems with anesthetics or Novocaine.   History of blood clots (thrombophlebitis).   History of bleeding or blood problems.   Surgery since your last pacemaker placement.   Other health problems.  RISKS AND COMPLICATIONS These are very uncommon but include:  Bleeding.   Bruising of the skin around where the incision was made.   Pain at the site of the incision.   Pulling apart of the skin at the incision site.   Infection.   Allergic reaction to anesthetics or medicines used during the procedure.  Diabetics may have a temporary increase in their blood sugar after any surgical procedure.  BEFORE THE PROCEDURE  Wash all of the skin around the area of the chest where the pacemaker is located.   Try to remove any loose, scaling skin. Unless advised otherwise, avoid using aspirin, ibuprofen, or naproxen for 3-4 days before the procedure. Ask your caregiver for help with any other medication adjustments before the pacemaker is replaced. Unless advised otherwise, do not eat or drink after midnight on the night before the procedure EXCEPT for drinking water and taking your medications as you normally would. AFTER THE PROCEDURE   A heart monitor and the pacemaker programmer will be used to make sure that the new pacemaker is working properly.   You can go home  after the procedure.   Your caregiver will advise you if you need to have any stitches. They will be removed 5-7 days after the procedure.  HOME CARE INSTRUCTIONS   Keep the incision clean and dry.   Unless advised otherwise, you may shower after carefully covering the incision with plastic wrap that is taped to your chest.   For the first week after the replacement, avoid stretching motions that pull at the incision site and avoid heavy exercise with the arm on the same side as the incision.   Only take over-the-counter or prescription medicines for pain, discomfort, or fever as directed by your caregiver.   Your caregiver will tell you when you will need to next test your pacemaker by telephone or when to return to the office for re-exam and/or removal of stitches, if necessary.  SEEK MEDICAL CARE IF:   You have unusual pain at the incision site that is not adequately helped by over-the-counter or prescription medicine.   There is drainage or pus from the incision site.   You develop red streaking that extends above or below the incision site.   You feel brief intermittent palpitations, lightheadedness or any symptoms that you feel might be related to your heart.  SEEK IMMEDIATE MEDICAL CARE IF:   You experience chest pain that is different than the pain at the incision site.   You experience:   Shortness of breath.   Palpitations.   Irregular heart beat.   Lightheadedness that does not go away quickly.   Fainting.   You develop a fever.   You have pain that gets worse even though you are taking pain medicine.  MAKE SURE YOU:   Understand these instructions.   Will watch your condition.   Will get help right away if you are not doing well or get worse.  Document Released: 04/08/2006 Document Revised: 12/18/2010 Document Reviewed: 07/12/2006 [employer] Patient Information 2012 [employer]. 

## 2011-03-23 NOTE — Interval H&P Note (Signed)
History and Physical Interval Note:  03/23/2011 12:18 PM  Sandra [name]  has presented today for surgery, with the diagnosis of End of life  The various methods of treatment have been discussed with the patient and family. After consideration of risks, benefits and other options for treatment, the patient has consented to  Procedure(s) (LRB): BIV ICD GENERTAOR CHANGE OUT (N/A) as a surgical intervention .  The patients' history has been reviewed, patient examined, no change in status, stable for surgery.  I have reviewed the patients' chart and labs.  Questions were answered to the patient's satisfaction.     Sherryl Manges  We will flouro the lead and then decide between generator replacement, lead replacement or deferral

## 2011-03-24 NOTE — Op Note (Signed)
NAMEPARISA, Sandra [name] NO.:  [id number]  MEDICAL RECORD NO.:  [id number]  LOCATION:  [facility]                         FACILITY:  [facility]  PHYSICIAN:  Duke Salvia, MD, FACCDATE OF BIRTH:  Oct 14, 1932  DATE OF PROCEDURE: DATE OF DISCHARGE:  03/23/2011                              OPERATIVE REPORT   PREOPERATIVE DIAGNOSIS:  Previously implanted CRT-D, now elective replacement indicator with a Riata lead.  POSTOPERATIVE DIAGNOSIS:  Previously implanted CRT-D, now elective replacement indicator with a Riata lead.  PROCEDURES:  Fluoroscopy of the defibrillator lead, pocket revision, explantation of previous device, implantation of new device, and intraoperative defibrillation threshold testing.  Following obtaining informed consent, the patient was brought to the electrophysiology laboratory and then placed on the fluoroscopic table in supine position.  After routine prep and drape, an incision was made about half a centimeter to a centimeter caudal to the previous incision and carried down to layer of the prepectoral fascia.  The device was excavated from about 1-2 cm caudal to this.  The leads thankfully were quite easy to free up, that turned out to be quite difficult to extricate from the device and we needed the lead removal tool.  We actually placed a new towel and all the hardware gasket, screws, and rings were all collected and disposed off.  Interrogation of the previously implanted [employer] LV lead [id number] demonstrated a unipolar amplitude of 29.1 with a pace impedance of 529, a threshold 0.7 at 0.5,  a current threshold of 1.2 mA.  Previously implanted 7000 Riata lead, which fluoroscopically had demonstrated no evidence of inside erosion, had an amplitude of 9.2 with a pace impedance of 355 with a threshold of 0.9 at 0.5, and a current of threshold was 2.4 mA.  Interrogation of the previously implanted atrial lead, [employer] 1688 TC demonstrated  amplitude of 1.9 with a pace impedance of 432 with a threshold of 1  at 0.5, and a current threshold was 2.2 mA.  With these acceptable parameters recorded, the pocket was revised and extended cephalad about a 1.5 cm and medially about 2 cm and a 2 silk suture was placed at the cephalad most extension of this pocket.  The leads were attached to a [employer] [id number] device, serial number [id number], and through the device, the bipolar P-wave was 1.5 with a pace impedance of 410, a threshold of 1.2 at 0.5.  The R-wave was greater than 12 with a pace impedance of 360, a threshold of 1 volt at 0.5 msec and the LV threshold of 0.7 at 0.5.  The high-voltage impedance was 41 ohms.  Defibrillation threshold testing was undertaken following securing of the device and having tethered it at the cephalad most medial possible aspect of the pocket.  The pocket was irrigated with antibiotic- containing saline solution and defibrillation threshold testing was undertaken.  Ventricular fibrillation was induced via the T-wave shock. After a total duration of 5.5 seconds, VF had stabilized into VT, was terminated with a 20-joule shock through measured resistance of 41 ohms, terminating ventricular tachycardia and restoring an AV paced rhythm, and the device was implanted.  Hemostasis was assured and the wound was then closed  in 3 layers in normal fashion. The wound was washed, dried, and a Benzoin and Steri-Strip dressing was applied.  Needle counts, sponge counts, and instrument counts were correct at the end of the procedure according to staff.  The patient tolerated the procedure without apparent complication.     Duke Salvia, MD, [facility]     SCK/MEDQ  D:  03/23/2011  T:  03/24/2011  Job:  [id number]

## 2011-03-30 ENCOUNTER — Encounter: Payer: Self-pay | Admitting: Internal Medicine

## 2011-04-01 ENCOUNTER — Ambulatory Visit (INDEPENDENT_AMBULATORY_CARE_PROVIDER_SITE_OTHER): Payer: Medicare Other

## 2011-04-01 ENCOUNTER — Ambulatory Visit (INDEPENDENT_AMBULATORY_CARE_PROVIDER_SITE_OTHER): Payer: Medicare Other | Admitting: *Deleted

## 2011-04-01 ENCOUNTER — Encounter: Payer: Self-pay | Admitting: Internal Medicine

## 2011-04-01 DIAGNOSIS — I5022 Chronic systolic (congestive) heart failure: Secondary | ICD-10-CM

## 2011-04-01 DIAGNOSIS — I421 Obstructive hypertrophic cardiomyopathy: Secondary | ICD-10-CM

## 2011-04-01 DIAGNOSIS — Z7901 Long term (current) use of anticoagulants: Secondary | ICD-10-CM

## 2011-04-01 DIAGNOSIS — I4891 Unspecified atrial fibrillation: Secondary | ICD-10-CM

## 2011-04-01 LAB — ICD DEVICE OBSERVATION
AL AMPLITUDE: 1.4 mv
AL THRESHOLD: 1 V
ATRIAL PACING ICD: 99.87 pct
BAMS-0001: 170 {beats}/min
DEV-0020ICD: NEGATIVE
DEVICE MODEL ICD: 7032457
FVT: 0
MODE SWITCH EPISODES: 0
PACEART VT: 0
RV LEAD AMPLITUDE: 12 mv
RV LEAD THRESHOLD: 1 V
TZAT-0001FASTVT: 1
TZAT-0004FASTVT: 8
TZAT-0018SLOWVT: NEGATIVE
TZAT-0019FASTVT: 7.5 V
TZAT-0019SLOWVT: 7.5 V
TZON-0003SLOWVT: 350 ms
TZON-0004FASTVT: 30
TZON-0004SLOWVT: 40
TZON-0010FASTVT: 40 ms
TZON-0010SLOWVT: 40 ms
TZST-0001FASTVT: 3
TZST-0001FASTVT: 4
TZST-0001SLOWVT: 2
TZST-0003FASTVT: 30 J
TZST-0003FASTVT: 40 J
TZST-0003SLOWVT: 40 J
VENTRICULAR PACING ICD: 99.94 pct
VF: 0

## 2011-04-01 LAB — POCT INR: INR: 1.6

## 2011-04-01 NOTE — Progress Notes (Signed)
Wound check-ICD 

## 2011-04-01 NOTE — Patient Instructions (Signed)
  Latest dosing instructions   Total Sun Mon Tue Wed Thu Fri Sat   25 5 mg 2.5 mg 2.5 mg 5 mg 2.5 mg 2.5 mg 5 mg    (5 mg1) (5 mg0.5) (5 mg0.5) (5 mg1) (5 mg0.5) (5 mg0.5) (5 mg1)        

## 2011-04-15 ENCOUNTER — Ambulatory Visit (INDEPENDENT_AMBULATORY_CARE_PROVIDER_SITE_OTHER): Payer: Medicare Other | Admitting: Internal Medicine

## 2011-04-15 DIAGNOSIS — I4891 Unspecified atrial fibrillation: Secondary | ICD-10-CM

## 2011-04-15 LAB — POCT INR: INR: 2.1

## 2011-04-15 NOTE — Patient Instructions (Signed)
  Latest dosing instructions   Total Sun Mon Tue Wed Thu Fri Sat   25 5 mg 2.5 mg 2.5 mg 5 mg 2.5 mg 2.5 mg 5 mg    (5 mg1) (5 mg0.5) (5 mg0.5) (5 mg1) (5 mg0.5) (5 mg0.5) (5 mg1)        

## 2011-04-20 ENCOUNTER — Other Ambulatory Visit: Payer: Self-pay | Admitting: Internal Medicine

## 2011-05-05 ENCOUNTER — Ambulatory Visit (INDEPENDENT_AMBULATORY_CARE_PROVIDER_SITE_OTHER): Payer: Medicare Other | Admitting: Internal Medicine

## 2011-05-05 ENCOUNTER — Encounter: Payer: Self-pay | Admitting: Internal Medicine

## 2011-05-05 VITALS — BP 130/80 | HR 72 | Temp 98.3°F | Resp 16 | Wt 146.0 lb

## 2011-05-05 DIAGNOSIS — E785 Hyperlipidemia, unspecified: Secondary | ICD-10-CM

## 2011-05-05 DIAGNOSIS — E039 Hypothyroidism, unspecified: Secondary | ICD-10-CM

## 2011-05-05 DIAGNOSIS — I4891 Unspecified atrial fibrillation: Secondary | ICD-10-CM

## 2011-05-05 NOTE — Patient Instructions (Signed)
The patient is instructed to continue all medications as prescribed. Schedule followup with check out clerk upon leaving the clinic  

## 2011-05-05 NOTE — Progress Notes (Signed)
Subjective:    Patient ID: Sandra [name], female    DOB: May 20, 1932, 78 y.o.   MRN: [id number]  HPI  Pt has HOCM and AF The pt was hospitalized for new defibrillator due to battery failure Blood pressure stable Pulse stable Anxiety due to husbands pain syndrome   Review of Systems  Constitutional: Negative for activity change, appetite change and fatigue.  HENT: Negative for ear pain, congestion, neck pain, postnasal drip and sinus pressure.   Eyes: Negative for redness and visual disturbance.  Respiratory: Negative for cough, shortness of breath and wheezing.   Gastrointestinal: Negative for abdominal pain and abdominal distention.  Genitourinary: Negative for dysuria, frequency and menstrual problem.  Musculoskeletal: Negative for myalgias, joint swelling and arthralgias.  Skin: Negative for rash and wound.  Neurological: Negative for dizziness, weakness and headaches.  Hematological: Negative for adenopathy. Does not bruise/bleed easily.  Psychiatric/Behavioral: Negative for sleep disturbance and decreased concentration.   Past Medical History  Diagnosis Date  . Atrial fibrillation   . Hyperlipidemia   . Hypothyroidism   . Bradycardia   . Cardiomyopathy, hypertrophic nonobstructive     ejection fraction 25%  . Anal fissure   . Adenomatous colon polyp   . Diverticulosis   . AICD (automatic cardioverter/defibrillator) present   . Systolic heart failure   . History of colonoscopy   . Heart murmur     History   Social History  . Marital Status: Married    Spouse Name: N/A    Number of Children: N/A  . Years of Education: N/A   Occupational History  . Retired    Social History Main Topics  . Smoking status: Former Games developer  . Smokeless tobacco: Not on file  . Alcohol Use: No  . Drug Use: No  . Sexually Active: Not Currently   Other Topics Concern  . Not on file   Social History Narrative  . No narrative on file    Past Surgical History  Procedure  Date  . Cardioversion   . Cardiac defibrillator placement     Family History  Problem Relation Age of Onset  . Heart disease Mother   . Cancer Father     colon  . COPD Father   . Heart disease Father   . Cardiomyopathy Daughter   . Cancer Paternal Aunt     BREAST    Allergies  Allergen Reactions  . Codeine     REACTION: nausea  . Niacin     REACTION: rash  . Procaine Hcl     REACTION: smothers  . Ramipril     REACTION: unspecified  . Statins     REACTION: muscle aches    Current Outpatient Prescriptions on File Prior to Visit  Medication Sig Dispense Refill  . benazepril (LOTENSIN) 10 MG tablet TAKE ONE TABLET BY MOUTH TWICE DAILY  180 tablet  3  . calcipotriene (DOVONOX) 0.005 % cream Apply topically 2 (two) times daily.        . carvedilol (COREG) 12.5 MG tablet Take 1 tablet (12.5 mg total) by mouth 2 (two) times daily with a meal.  180 tablet  3  . clorazepate (TRANXENE) 7.5 MG tablet Take 1 tablet (7.5 mg total) by mouth 2 (two) times daily as needed.  60 tablet  3  . ezetimibe (ZETIA) 10 MG tablet Take 1 tablet (10 mg total) by mouth daily.  90 tablet  3  . furosemide (LASIX) 20 MG tablet Take two and a half tablets by  mouth daily.      Marland Kitchen HYDROcodone-homatropine (HYCODAN) 5-1.5 MG/5ML syrup TAKE ONE TEASPOONFUL BY MOUTH TWICE DAILY AS NEEDED FOR COUGH  180 mL  1  . hydrocortisone (ANUSOL-HC) 2.5 % rectal cream Place 1 application rectally 2 (two) times daily.        . metoprolol succinate (TOPROL-XL) 50 MG 24 hr tablet Take 1/2 tablet by mouth as needed      . SYNTHROID 100 MCG tablet TAKE ONE TABLET BY MOUTH EVERY DAY  90 each  3  . vitamin E 400 UNIT capsule Take 400 Units by mouth daily.        Marland Kitchen warfarin (COUMADIN) 5 MG tablet AS DIRECTED  90 tablet  3    BP 130/80  Pulse 72  Temp 98.3 F (36.8 C)  Resp 16  Wt 146 lb (66.225 kg)       Objective:   Physical Exam  Nursing note and vitals reviewed. Constitutional: She is oriented to person, place,  and time. She appears well-developed and well-nourished. No distress.  HENT:  Head: Normocephalic and atraumatic.  Right Ear: External ear normal.  Left Ear: External ear normal.  Nose: Nose normal.  Mouth/Throat: Oropharynx is clear and moist.  Eyes: Conjunctivae and EOM are normal. Pupils are equal, round, and reactive to light.  Neck: Normal range of motion. Neck supple. No JVD present. No tracheal deviation present. No thyromegaly present.  Cardiovascular: Normal rate, regular rhythm, normal heart sounds and intact distal pulses.   No murmur heard. Pulmonary/Chest: Effort normal and breath sounds normal. She has no wheezes. She exhibits no tenderness.  Abdominal: Soft. Bowel sounds are normal.  Musculoskeletal: Normal range of motion. She exhibits no edema and no tenderness.  Lymphadenopathy:    She has no cervical adenopathy.  Neurological: She is alert and oriented to person, place, and time. She has normal reflexes. No cranial nerve deficit.  Skin: Skin is warm and dry. She is not diaphoretic.  Psychiatric: She has a normal mood and affect. Her behavior is normal.          Assessment & Plan:  General she is done well since the insertion of the new pacemaker due to that her entire she states that she has not had any rapid heartbeat but does feel that her pacemaker kicks in from time to time.  We will monitor her thyroid to make sure she is stable she has no symptomatic bradycardia or hypotension at this time. She is under increased stress due to her husband's illness

## 2011-05-13 ENCOUNTER — Ambulatory Visit: Payer: Medicare Other

## 2011-05-13 DIAGNOSIS — I4891 Unspecified atrial fibrillation: Secondary | ICD-10-CM

## 2011-05-13 LAB — POCT INR: INR: 2.3

## 2011-05-13 NOTE — Patient Instructions (Signed)
  Latest dosing instructions   Total Sun Mon Tue Wed Thu Fri Sat   25 5 mg 2.5 mg 2.5 mg 5 mg 2.5 mg 2.5 mg 5 mg    (5 mg1) (5 mg0.5) (5 mg0.5) (5 mg1) (5 mg0.5) (5 mg0.5) (5 mg1)        

## 2011-06-04 ENCOUNTER — Telehealth: Payer: Self-pay

## 2011-06-04 NOTE — Telephone Encounter (Signed)
Okay to use the Cipro twice daily

## 2011-06-04 NOTE — Telephone Encounter (Signed)
Left message on machine Keep area clean- upto day date on dt--instructed to start cipro as dr Kirtland Bouchard suggested

## 2011-06-04 NOTE — Telephone Encounter (Signed)
Left message for pt to call back  °

## 2011-06-04 NOTE — Telephone Encounter (Signed)
Triage VM:  Pt states she was trying to take a toy away from her dog and the dog tore the skin on her hand.  Pt states 2 to 3 weeks prior pt's dog was treated for an infection on the lip and the dog had to have an antibiotic.  Pt states she cleaned the site and put peroxide on it and it looks ok.  Pt states the site is still red but it is a "closed tear".  Pt would like to know if she needs to be on an antibotic.  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 Pt states she has some Cipro left that she can take.  Pls advise.

## 2011-06-17 ENCOUNTER — Ambulatory Visit (INDEPENDENT_AMBULATORY_CARE_PROVIDER_SITE_OTHER): Payer: Medicare Other | Admitting: Family

## 2011-06-17 DIAGNOSIS — I4891 Unspecified atrial fibrillation: Secondary | ICD-10-CM

## 2011-06-17 NOTE — Patient Instructions (Signed)
Continue the same dosage. Recheck in 4 weeks    Latest dosing instructions   Total Sun Mon Tue Wed Thu Fri Sat   25 5 mg 2.5 mg 2.5 mg 5 mg 2.5 mg 2.5 mg 5 mg    (5 mg1) (5 mg0.5) (5 mg0.5) (5 mg1) (5 mg0.5) (5 mg0.5) (5 mg1)

## 2011-07-02 ENCOUNTER — Ambulatory Visit (INDEPENDENT_AMBULATORY_CARE_PROVIDER_SITE_OTHER): Payer: Medicare Other | Admitting: *Deleted

## 2011-07-02 ENCOUNTER — Encounter: Payer: Self-pay | Admitting: Internal Medicine

## 2011-07-02 DIAGNOSIS — I428 Other cardiomyopathies: Secondary | ICD-10-CM

## 2011-07-02 LAB — ICD DEVICE OBSERVATION
AL AMPLITUDE: 1.7 mv
AL IMPEDENCE ICD: 400 Ohm
DEVICE MODEL ICD: 7032457
HV IMPEDENCE: 43 Ohm
MODE SWITCH EPISODES: 2
PACEART VT: 0
RV LEAD THRESHOLD: 0.875 V
TOT-0007: 1
TOT-0009: 0
TOT-0010: 2
TZAT-0004SLOWVT: 8
TZAT-0012SLOWVT: 200 ms
TZAT-0013FASTVT: 2
TZAT-0013SLOWVT: 3
TZAT-0018FASTVT: NEGATIVE
TZAT-0019FASTVT: 7.5 V
TZAT-0019SLOWVT: 7.5 V
TZAT-0020FASTVT: 1 ms
TZAT-0020SLOWVT: 1 ms
TZON-0004FASTVT: 30
TZON-0004SLOWVT: 40
TZON-0005FASTVT: 6
TZON-0005SLOWVT: 6
TZST-0001FASTVT: 2
TZST-0001FASTVT: 4
TZST-0001SLOWVT: 3
TZST-0001SLOWVT: 4
TZST-0003FASTVT: 36 J
TZST-0003FASTVT: 40 J
TZST-0003SLOWVT: 30 J
TZST-0003SLOWVT: 36 J
VF: 0

## 2011-07-02 NOTE — Progress Notes (Signed)
defib check in clinic  

## 2011-07-09 ENCOUNTER — Encounter: Payer: Self-pay | Admitting: Internal Medicine

## 2011-07-15 ENCOUNTER — Ambulatory Visit (INDEPENDENT_AMBULATORY_CARE_PROVIDER_SITE_OTHER): Payer: Medicare Other | Admitting: Family

## 2011-07-15 DIAGNOSIS — I4891 Unspecified atrial fibrillation: Secondary | ICD-10-CM

## 2011-07-15 NOTE — Patient Instructions (Addendum)
Same dose, 5mg., 2.5mg., 2.5mg. Alternating, Check in 6 weeks.    Latest dosing instructions   Total Sun Mon Tue Wed Thu Fri Sat   25 5 mg 2.5 mg 2.5 mg 5 mg 2.5 mg 2.5 mg 5 mg    (5 mg1) (5 mg0.5) (5 mg0.5) (5 mg1) (5 mg0.5) (5 mg0.5) (5 mg1)        

## 2011-08-03 ENCOUNTER — Other Ambulatory Visit: Payer: Self-pay | Admitting: Internal Medicine

## 2011-08-04 ENCOUNTER — Ambulatory Visit (INDEPENDENT_AMBULATORY_CARE_PROVIDER_SITE_OTHER): Payer: Medicare Other | Admitting: Internal Medicine

## 2011-08-04 ENCOUNTER — Encounter: Payer: Self-pay | Admitting: Internal Medicine

## 2011-08-04 VITALS — BP 130/80 | HR 76 | Temp 98.2°F | Resp 16 | Ht 63.0 in | Wt 145.0 lb

## 2011-08-04 DIAGNOSIS — Z95810 Presence of automatic (implantable) cardiac defibrillator: Secondary | ICD-10-CM

## 2011-08-04 DIAGNOSIS — I4891 Unspecified atrial fibrillation: Secondary | ICD-10-CM

## 2011-08-04 DIAGNOSIS — I421 Obstructive hypertrophic cardiomyopathy: Secondary | ICD-10-CM

## 2011-08-04 DIAGNOSIS — E039 Hypothyroidism, unspecified: Secondary | ICD-10-CM

## 2011-08-04 DIAGNOSIS — N182 Chronic kidney disease, stage 2 (mild): Secondary | ICD-10-CM

## 2011-08-04 LAB — BASIC METABOLIC PANEL
BUN: 30 mg/dL — ABNORMAL HIGH (ref 6–23)
Calcium: 9.2 mg/dL (ref 8.4–10.5)
GFR: 38.39 mL/min — ABNORMAL LOW (ref >60.00)
Glucose, Bld: 72 mg/dL (ref 70–99)
Potassium: 4 mEq/L (ref 3.5–5.1)

## 2011-08-04 LAB — LIPID PANEL
Cholesterol: 231 mg/dL — ABNORMAL HIGH (ref 0–200)
VLDL: 44 mg/dL — ABNORMAL HIGH (ref 0.0–40.0)

## 2011-08-04 LAB — LDL CHOLESTEROL, DIRECT: Direct LDL: 158 mg/dL

## 2011-08-04 NOTE — Progress Notes (Signed)
  Subjective:    Patient ID: Sandra [name], female    DOB: 1932-11-15, 78 y.o.   MRN: [id number]  HPI patient is a 78 year old female who is followed for hypertension hyperlipidemia hypothyroidism and a history of atrial fibrillation in the setting of hypertrophic cardiomyopathy. Patient has had a implantable defibrillator pacer change she states that it has dropped in position on her chest wall there is significant scar tissue from prior attempts and replacement.  Generally she is doing well with the pacemaker but the position of the pacemaker is problematic and sensitive.  Her blood pressure stable her rate is stable she has no current chest pains associated with her disease although she does have some discomfort when there is pressure against the scar tissue     Review of Systems  Constitutional: Negative for activity change, appetite change and fatigue.  HENT: Negative for ear pain, congestion, neck pain, postnasal drip and sinus pressure.   Eyes: Negative for redness and visual disturbance.  Respiratory: Negative for cough, shortness of breath and wheezing.   Gastrointestinal: Negative for abdominal pain and abdominal distention.  Genitourinary: Negative for dysuria, frequency and menstrual problem.  Musculoskeletal: Negative for myalgias, joint swelling and arthralgias.  Skin: Negative for rash and wound.  Neurological: Negative for dizziness, weakness and headaches.  Hematological: Negative for adenopathy. Does not bruise/bleed easily.  Psychiatric/Behavioral: Negative for disturbed wake/sleep cycle and decreased concentration.       Objective:   Physical Exam  Nursing note and vitals reviewed. Constitutional: She is oriented to person, place, and time. She appears well-developed and well-nourished. No distress.  HENT:  Head: Normocephalic and atraumatic.  Right Ear: External ear normal.  Left Ear: External ear normal.  Nose: Nose normal.  Mouth/Throat: Oropharynx is  clear and moist.  Eyes: Conjunctivae and EOM are normal. Pupils are equal, round, and reactive to light.  Neck: Normal range of motion. Neck supple. No JVD present. No tracheal deviation present. No thyromegaly present.  Cardiovascular: Normal rate, regular rhythm and intact distal pulses.   Murmur heard. Pulmonary/Chest: Effort normal and breath sounds normal. She has no wheezes. She exhibits no tenderness.  Abdominal: Soft. Bowel sounds are normal.  Musculoskeletal: Normal range of motion. She exhibits no edema and no tenderness.  Lymphadenopathy:    She has no cervical adenopathy.  Neurological: She is alert and oriented to person, place, and time. She has normal reflexes. No cranial nerve deficit.  Skin: Skin is warm and dry. She is not diaphoretic.  Psychiatric: She has a normal mood and affect. Her behavior is normal.          Assessment & Plan:   Stable HTN Site of pacer healing well but position has dropped monitoring labs for lipids, CRI and TSH today

## 2011-08-05 ENCOUNTER — Other Ambulatory Visit: Payer: Self-pay | Admitting: Internal Medicine

## 2011-08-26 ENCOUNTER — Ambulatory Visit (INDEPENDENT_AMBULATORY_CARE_PROVIDER_SITE_OTHER): Payer: Medicare Other | Admitting: Family

## 2011-08-26 DIAGNOSIS — I4891 Unspecified atrial fibrillation: Secondary | ICD-10-CM

## 2011-08-26 LAB — POCT INR: INR: 2.6

## 2011-08-26 NOTE — Patient Instructions (Signed)
Same dose, 5mg., 2.5mg., 2.5mg. Alternating, Check in 6 weeks.    Latest dosing instructions   Total Sun Mon Tue Wed Thu Fri Sat   25 5 mg 2.5 mg 2.5 mg 5 mg 2.5 mg 2.5 mg 5 mg    (5 mg1) (5 mg0.5) (5 mg0.5) (5 mg1) (5 mg0.5) (5 mg0.5) (5 mg1)        

## 2011-09-29 ENCOUNTER — Ambulatory Visit (INDEPENDENT_AMBULATORY_CARE_PROVIDER_SITE_OTHER): Payer: Medicare Other | Admitting: *Deleted

## 2011-09-29 ENCOUNTER — Encounter: Payer: Self-pay | Admitting: Internal Medicine

## 2011-09-29 DIAGNOSIS — I5022 Chronic systolic (congestive) heart failure: Secondary | ICD-10-CM

## 2011-09-29 DIAGNOSIS — I4891 Unspecified atrial fibrillation: Secondary | ICD-10-CM

## 2011-09-29 LAB — ICD DEVICE OBSERVATION
AL AMPLITUDE: 2.3 mv
AL THRESHOLD: 1.125 V
ATRIAL PACING ICD: 98 pct
BAMS-0003: 70 {beats}/min
DEV-0020ICD: NEGATIVE
DEVICE MODEL ICD: 7032457
RV LEAD AMPLITUDE: 12 mv
RV LEAD THRESHOLD: 0.875 V
TZAT-0001FASTVT: 1
TZAT-0004SLOWVT: 8
TZAT-0012FASTVT: 200 ms
TZAT-0012SLOWVT: 200 ms
TZAT-0018SLOWVT: NEGATIVE
TZAT-0019FASTVT: 7.5 V
TZAT-0019SLOWVT: 7.5 V
TZAT-0020FASTVT: 1 ms
TZON-0003SLOWVT: 350 ms
TZON-0004FASTVT: 30
TZON-0004SLOWVT: 40
TZON-0005FASTVT: 6
TZON-0005SLOWVT: 6
TZST-0001FASTVT: 2
TZST-0001FASTVT: 4
TZST-0001SLOWVT: 3
TZST-0003FASTVT: 30 J
TZST-0003FASTVT: 36 J
TZST-0003FASTVT: 40 J
TZST-0003SLOWVT: 36 J
VENTRICULAR PACING ICD: 98 pct

## 2011-09-29 NOTE — Progress Notes (Signed)
defib check in clinic  

## 2011-10-07 ENCOUNTER — Ambulatory Visit (INDEPENDENT_AMBULATORY_CARE_PROVIDER_SITE_OTHER): Payer: Medicare Other | Admitting: Family

## 2011-10-07 ENCOUNTER — Encounter: Payer: Medicare Other | Admitting: Family

## 2011-10-07 DIAGNOSIS — I4891 Unspecified atrial fibrillation: Secondary | ICD-10-CM

## 2011-10-07 LAB — POCT INR: INR: 2.1

## 2011-10-07 NOTE — Patient Instructions (Addendum)
Same dose, 5mg ., 2.5mg ., 2.5mg . Alternating, Check in 6 weeks.    Latest dosing instructions   Total Glynis Smiles Tue Wed Thu Fri Sat   25 5 mg 2.5 mg 2.5 mg 5 mg 2.5 mg 2.5 mg 5 mg    (5 mg1) (5 mg0.5) (5 mg0.5) (5 mg1) (5 mg0.5) (5 mg0.5) (5 mg1)

## 2011-10-20 ENCOUNTER — Telehealth: Payer: Self-pay | Admitting: Internal Medicine

## 2011-10-20 NOTE — Telephone Encounter (Signed)
Appt made to see Sandra [name] on 10/22/2011.

## 2011-10-20 NOTE — Telephone Encounter (Signed)
plz return call to pt who is complaining of SOB on exhertion. She can be reached at [phone]

## 2011-10-22 ENCOUNTER — Ambulatory Visit (INDEPENDENT_AMBULATORY_CARE_PROVIDER_SITE_OTHER): Payer: Medicare Other | Admitting: Nurse Practitioner

## 2011-10-22 ENCOUNTER — Encounter: Payer: Self-pay | Admitting: Nurse Practitioner

## 2011-10-22 ENCOUNTER — Telehealth: Payer: Self-pay | Admitting: *Deleted

## 2011-10-22 ENCOUNTER — Ambulatory Visit (HOSPITAL_COMMUNITY): Payer: Medicare Other | Attending: Cardiology | Admitting: Radiology

## 2011-10-22 VITALS — BP 116/72 | HR 76 | Ht 64.0 in | Wt 146.1 lb

## 2011-10-22 DIAGNOSIS — I10 Essential (primary) hypertension: Secondary | ICD-10-CM | POA: Insufficient documentation

## 2011-10-22 DIAGNOSIS — I379 Nonrheumatic pulmonary valve disorder, unspecified: Secondary | ICD-10-CM | POA: Insufficient documentation

## 2011-10-22 DIAGNOSIS — R0609 Other forms of dyspnea: Secondary | ICD-10-CM

## 2011-10-22 DIAGNOSIS — I421 Obstructive hypertrophic cardiomyopathy: Secondary | ICD-10-CM

## 2011-10-22 DIAGNOSIS — R0989 Other specified symptoms and signs involving the circulatory and respiratory systems: Secondary | ICD-10-CM

## 2011-10-22 DIAGNOSIS — E039 Hypothyroidism, unspecified: Secondary | ICD-10-CM

## 2011-10-22 DIAGNOSIS — I369 Nonrheumatic tricuspid valve disorder, unspecified: Secondary | ICD-10-CM | POA: Insufficient documentation

## 2011-10-22 DIAGNOSIS — R0600 Dyspnea, unspecified: Secondary | ICD-10-CM

## 2011-10-22 DIAGNOSIS — R0602 Shortness of breath: Secondary | ICD-10-CM

## 2011-10-22 DIAGNOSIS — I080 Rheumatic disorders of both mitral and aortic valves: Secondary | ICD-10-CM | POA: Insufficient documentation

## 2011-10-22 LAB — BASIC METABOLIC PANEL
BUN: 32 mg/dL — ABNORMAL HIGH (ref 6–23)
CO2: 27 mEq/L (ref 19–32)
Calcium: 9 mg/dL (ref 8.4–10.5)
Chloride: 107 mEq/L (ref 96–112)
Creatinine, Ser: 1.5 mg/dL — ABNORMAL HIGH (ref 0.4–1.2)
GFR: 36.85 mL/min — ABNORMAL LOW (ref >60.00)
Glucose, Bld: 71 mg/dL (ref 70–99)
Potassium: 3.7 mEq/L (ref 3.5–5.1)
Sodium: 141 mEq/L (ref 135–145)

## 2011-10-22 LAB — CBC WITH DIFFERENTIAL/PLATELET
Basophils Absolute: 0 10*3/uL (ref 0.0–0.1)
Basophils Relative: 0.4 % (ref 0.0–3.0)
Eosinophils Absolute: 0.2 10*3/uL (ref 0.0–0.7)
Eosinophils Relative: 2.7 % (ref 0.0–5.0)
HCT: 36.1 % (ref 36.0–46.0)
Hemoglobin: 11.7 g/dL — ABNORMAL LOW (ref 12.0–15.0)
Lymphocytes Relative: 14.9 % (ref 12.0–46.0)
Lymphs Abs: 1 10*3/uL (ref 0.7–4.0)
MCHC: 32.4 g/dL (ref 30.0–36.0)
MCV: 87.4 fl (ref 78.0–100.0)
Monocytes Absolute: 0.5 10*3/uL (ref 0.1–1.0)
Monocytes Relative: 7.8 % (ref 3.0–12.0)
Neutro Abs: 5.1 10*3/uL (ref 1.4–7.7)
Neutrophils Relative %: 74.2 % (ref 43.0–77.0)
Platelets: 158 10*3/uL (ref 150.0–400.0)
RBC: 4.13 Mil/uL (ref 3.87–5.11)
RDW: 15.3 % — ABNORMAL HIGH (ref 11.5–14.6)
WBC: 6.8 10*3/uL (ref 4.5–10.5)

## 2011-10-22 LAB — BRAIN NATRIURETIC PEPTIDE: Pro B Natriuretic peptide (BNP): 1114 pg/mL — ABNORMAL HIGH (ref 0.0–100.0)

## 2011-10-22 LAB — MAGNESIUM: Magnesium: 2.3 mg/dL (ref 1.5–2.5)

## 2011-10-22 LAB — TSH: TSH: 0.25 u[IU]/mL — ABNORMAL LOW (ref 0.35–5.50)

## 2011-10-22 MED ORDER — POTASSIUM CHLORIDE CRYS ER 20 MEQ PO TBCR: 20.0000 meq | EXTENDED_RELEASE_TABLET | Freq: Every day | ORAL | Status: DC

## 2011-10-22 NOTE — Telephone Encounter (Signed)
Pt notified of lab results.  Pt verbalized understanding of recommendations to increase Lasix to 40 mg bid x 3 days.  She will pick up the Potassium 20 mEq and take it once daily for 3 days.  Pt has questions about echo results.  Told her they would not be available until tomorrow once echo is read by physician.  Vista Mink, CMA

## 2011-10-22 NOTE — Patient Instructions (Addendum)
We are going to get an ultrasound of your heart  We are going to check labs today  I want to see you in a month  I want you to stay on your current medicines  Call the [facility] office at [phone] if you have any questions, problems or concerns.

## 2011-10-22 NOTE — Progress Notes (Signed)
Echocardiogram performed.  

## 2011-10-22 NOTE — Telephone Encounter (Signed)
Message copied by Awilda Bill on Thu Oct 22, 2011  3:47 PM ------      Message from: Rosalio Macadamia      Created: Thu Oct 22, 2011  3:43 PM       Ok to report. Labs are satisfactory but fluid test is up. Would change her lasix to 40 mg BID for 3 days, then back to her usual dose. Add Kdur 20 meq daily. Will see what the echo shows. Thyroid level was a little better. Please send copy to her PCP.

## 2011-10-22 NOTE — Progress Notes (Signed)
Sandra [name] Date of Birth: [birth year] Medical Record #[id number]  History of Present Illness: Ms. Mckean is seen today for a work in visit. She is seen for Dr. Graciela Husbands. She has a history of atrial fib, on Tikosyn and on chronic anticoagulation. Has a hypertrophic cardiomyopathy and has decreased LV function. CRT-D is in place with generator change out earlier this year. Her other problems include hypothyroidism.   She comes in today. She is here alone. She called earlier this week complaining of DOE. She notes that she has really done very well since her ICD change out in March. About 7 weeks ago, she took on more projects. Started painting the inside of her house. Her dog is getting old and having health issues. Her husband has been having issues. She thought she had overdone things. She felt like she was having to "push" to do everything. She felt more DOE. No chest pain. No atrial fib. Has had to sit up some at night. No swelling. More abdominal bloating. Did increase her Lasix for 3 days and this has helped. She is planning a trip to [place] with a girlfriend on the 19th for a week and was concerned. She does not cough. No fever or chills. No swelling. Has been trying to lose weight. Her weight is down here today. Her last TSH is noted. Her thyroid dose has not been changed. She has checked her oxygen sats at home and it was 97%. She wonders if she could have asthma. Her device was checked last month and found to be satisfactory. She does not use salt. She usually tries to pace herself in regards to her activities.   Current Outpatient Prescriptions on File Prior to Visit  Medication Sig Dispense Refill  . benazepril (LOTENSIN) 10 MG tablet TAKE ONE TABLET BY MOUTH TWICE DAILY  180 tablet  3  . calcipotriene (DOVONOX) 0.005 % cream Apply topically as needed.       . carvedilol (COREG) 12.5 MG tablet Take 1 tablet (12.5 mg total) by mouth 2 (two) times daily with a meal.  180 tablet  3  .  clorazepate (TRANXENE) 7.5 MG tablet Take 1 tablet (7.5 mg total) by mouth 2 (two) times daily as needed.  60 tablet  3  . dofetilide (TIKOSYN) 125 MCG capsule Take 125 mcg by mouth 2 (two) times daily. Total of 375 bid      . dofetilide (TIKOSYN) 250 MCG capsule Take 250 mcg by mouth 2 (two) times daily. Total of 375 bid      . ezetimibe (ZETIA) 10 MG tablet Take 1 tablet (10 mg total) by mouth daily.  90 tablet  3  . furosemide (LASIX) 20 MG tablet Take two and a half tablets by mouth daily.      . metoprolol succinate (TOPROL-XL) 50 MG 24 hr tablet Take 1/2 tablet by mouth as needed      . PROCTOSOL HC 2.5 % rectal cream USE AS DIRECTED  29 g  3  . SYNTHROID 100 MCG tablet TAKE ONE TABLET BY MOUTH EVERY DAY  90 each  3  . vitamin E 400 UNIT capsule Take 400 Units by mouth daily.        Marland Kitchen warfarin (COUMADIN) 5 MG tablet AS DIRECTED  90 tablet  3  . HYDROcodone-homatropine (HYCODAN) 5-1.5 MG/5ML syrup TAKE ONE TEASPOONFUL BY MOUTH TWICE DAILY AS NEEDED FOR COUGH  180 mL  1    Allergies  Allergen Reactions  . Codeine  REACTION: nausea  . Niacin     REACTION: rash  . Procaine Hcl     REACTION: smothers  . Ramipril     REACTION: unspecified  . Statins     REACTION: muscle aches    Past Medical History  Diagnosis Date  . Atrial fibrillation     on Tikosyn and Coumadin  . Hyperlipidemia   . Hypothyroidism   . Bradycardia   . Cardiomyopathy, hypertrophic nonobstructive     ejection fraction 25%  . Anal fissure   . Adenomatous colon polyp   . Diverticulosis   . AICD (automatic cardioverter/defibrillator) present   . Systolic heart failure   . History of colonoscopy   . Heart murmur     Past Surgical History  Procedure Date  . Cardioversion   . Cardiac defibrillator placement     History  Smoking status  . Former Smoker  Smokeless tobacco  . Not on file    History  Alcohol Use No    Family History  Problem Relation Age of Onset  . Heart disease Mother   .  Cancer Father     colon  . COPD Father   . Heart disease Father   . Cardiomyopathy Daughter   . Cancer Paternal Aunt     BREAST    Review of Systems: The review of systems is per the HPI.  All other systems were reviewed and are negative.  Physical Exam: BP 116/72  Pulse 76  Ht 5\' 4"  (1.626 m)  Wt 146 lb 1.9 oz (66.28 kg)  BMI 25.08 kg/m2 Patient is very pleasant and in no acute distress. Skin is warm and dry. Color is normal.  HEENT is unremarkable. Normocephalic/atraumatic. PERRL. Sclera are nonicteric. Neck is supple. No masses. No JVD. Lungs are clear. Cardiac exam shows a regular rate and rhythm. Systolic murmur noted. ICD sits low in the left chest. Abdomen is protruberant but soft. Extremities are without edema. Gait and ROM are intact. No gross neurologic deficits noted.  LABORATORY DATA: PENDING  Echo Study Conclusions form 2011  - Left ventricle: The EF is 25%. The apex moves better than the other walls. The base of the inferior wall is akinetic. The cavity size was normal. Wall thickness was increased in a pattern of mild LVH. Doppler parameters are consistent with high ventricular filling pressure. - Mitral valve: Mild regurgitation. - Left atrium: The atrium was mildly to moderately dilated. - Right ventricle: The cavity size was normal. Pacer wire or catheter noted in right ventricle. Systolic function was normal. - Right atrium: The atrium was mildly to moderately dilated. - Pulmonary arteries: PA peak pressure: 41mm Hg (S).  Lab Results  Component Value Date   WBC 6.9 03/20/2011   HGB 12.3 03/20/2011   HCT 38.9 03/20/2011   PLT 224 03/20/2011   GLUCOSE 72 08/04/2011   CHOL 231* 08/04/2011   TRIG 220.0* 08/04/2011   HDL 42.30 08/04/2011   LDLDIRECT 158.0 08/04/2011   LDLCALC 124* 06/24/2009   ALT 30 06/24/2009   AST 35 06/24/2009   NA 140 08/04/2011   K 4.0 08/04/2011   CL 104 08/04/2011   CREATININE 1.4* 08/04/2011   BUN 30* 08/04/2011   CO2 27 08/04/2011   TSH 0.06*  08/04/2011   INR 2.1 10/07/2011    Assessment / Plan:  1. Dyspnea - seems like she feels better after increasing her diuretics. Her weight is down by 6 pounds today. We are going to recheck her labs today.  Update her echo as well. For now, I have left her on her current regimen.   2. PAF - holding in sinus on Tikosyn. Will check labs today and include MG level since she has increased her diuretics.   3. Hypertrophic CM - EF is 25% - has CRT-D in place. Will update her echo  4. Hypothyroidism - last TSH quite low. Rechecking TSH today. May need dose adjusted.   I will plan on seeing her back in about a month.   Patient is agreeable to this plan and will call if any problems develop in the interim.

## 2011-11-04 ENCOUNTER — Ambulatory Visit: Payer: Medicare Other | Admitting: Internal Medicine

## 2011-11-10 ENCOUNTER — Encounter: Payer: Self-pay | Admitting: Internal Medicine

## 2011-11-10 ENCOUNTER — Ambulatory Visit (INDEPENDENT_AMBULATORY_CARE_PROVIDER_SITE_OTHER): Payer: Medicare Other | Admitting: Internal Medicine

## 2011-11-10 VITALS — BP 126/80 | HR 76 | Temp 98.2°F | Resp 16 | Ht 64.0 in | Wt 146.0 lb

## 2011-11-10 DIAGNOSIS — I4891 Unspecified atrial fibrillation: Secondary | ICD-10-CM

## 2011-11-10 DIAGNOSIS — I5022 Chronic systolic (congestive) heart failure: Secondary | ICD-10-CM

## 2011-11-10 DIAGNOSIS — E039 Hypothyroidism, unspecified: Secondary | ICD-10-CM

## 2011-11-10 DIAGNOSIS — Z23 Encounter for immunization: Secondary | ICD-10-CM

## 2011-11-10 NOTE — Progress Notes (Signed)
Subjective:    Patient ID: Sandra [name], female    DOB: 03/16/1932, 78 y.o.   MRN: [id number]  HPI Has mild persistant CHF that is probable rate related but she has increased lasix to 60 EF was 30% Has follow up with PA and Dr Graciela Husbands Stable HR    Review of Systems  Constitutional: Negative for activity change, appetite change and fatigue.  HENT: Negative for ear pain, congestion, neck pain, postnasal drip and sinus pressure.   Eyes: Negative for redness and visual disturbance.  Respiratory: Positive for shortness of breath. Negative for cough and wheezing.   Cardiovascular: Positive for palpitations.  Gastrointestinal: Negative for abdominal pain and abdominal distention.  Genitourinary: Negative for dysuria, frequency and menstrual problem.  Musculoskeletal: Negative for myalgias, joint swelling and arthralgias.  Skin: Negative for rash and wound.  Neurological: Positive for facial asymmetry. Negative for dizziness, weakness and headaches.  Hematological: Negative for adenopathy. Does not bruise/bleed easily.  Psychiatric/Behavioral: Negative for disturbed wake/sleep cycle and decreased concentration.   Past Medical History  Diagnosis Date  . Atrial fibrillation     on Tikosyn and Coumadin  . Hyperlipidemia   . Hypothyroidism   . Bradycardia   . Cardiomyopathy, hypertrophic nonobstructive     ejection fraction 25%  . Anal fissure   . Adenomatous colon polyp   . Diverticulosis   . AICD (automatic cardioverter/defibrillator) present   . Systolic heart failure   . History of colonoscopy   . Heart murmur     History   Social History  . Marital Status: Married    Spouse Name: N/A    Number of Children: N/A  . Years of Education: N/A   Occupational History  . Retired    Social History Main Topics  . Smoking status: Former Games developer  . Smokeless tobacco: Not on file  . Alcohol Use: No  . Drug Use: No  . Sexually Active: Not Currently   Other Topics Concern  .  Not on file   Social History Narrative  . No narrative on file    Past Surgical History  Procedure Date  . Cardioversion   . Cardiac defibrillator placement     Family History  Problem Relation Age of Onset  . Heart disease Mother   . Cancer Father     colon  . COPD Father   . Heart disease Father   . Cardiomyopathy Daughter   . Cancer Paternal Aunt     BREAST    Allergies  Allergen Reactions  . Codeine     REACTION: nausea  . Niacin     REACTION: rash  . Procaine Hcl     REACTION: smothers  . Ramipril     REACTION: unspecified  . Statins     REACTION: muscle aches    Current Outpatient Prescriptions on File Prior to Visit  Medication Sig Dispense Refill  . benazepril (LOTENSIN) 10 MG tablet TAKE ONE TABLET BY MOUTH TWICE DAILY  180 tablet  3  . calcipotriene (DOVONOX) 0.005 % cream Apply topically as needed.       . carvedilol (COREG) 12.5 MG tablet Take 1 tablet (12.5 mg total) by mouth 2 (two) times daily with a meal.  180 tablet  3  . clorazepate (TRANXENE) 7.5 MG tablet Take 1 tablet (7.5 mg total) by mouth 2 (two) times daily as needed.  60 tablet  3  . dofetilide (TIKOSYN) 125 MCG capsule Take 125 mcg by mouth 2 (two) times daily. Total  of 375 bid      . dofetilide (TIKOSYN) 250 MCG capsule Take 250 mcg by mouth 2 (two) times daily. Total of 375 bid      . ezetimibe (ZETIA) 10 MG tablet Take 1 tablet (10 mg total) by mouth daily.  90 tablet  3  . furosemide (LASIX) 20 MG tablet Take two and a half tablets by mouth daily.      Marland Kitchen HYDROcodone-homatropine (HYCODAN) 5-1.5 MG/5ML syrup TAKE ONE TEASPOONFUL BY MOUTH TWICE DAILY AS NEEDED FOR COUGH  180 mL  1  . metoprolol succinate (TOPROL-XL) 50 MG 24 hr tablet Take 1/2 tablet by mouth as needed      . potassium chloride SA (KLOR-CON M20) 20 MEQ tablet Take 1 tablet (20 mEq total) by mouth daily.  30 tablet  0  . PROCTOSOL HC 2.5 % rectal cream USE AS DIRECTED  29 g  3  . SYNTHROID 100 MCG tablet TAKE ONE TABLET  BY MOUTH EVERY DAY  90 each  3  . vitamin E 400 UNIT capsule Take 400 Units by mouth daily.        Marland Kitchen warfarin (COUMADIN) 5 MG tablet AS DIRECTED  90 tablet  3    BP 126/80  Pulse 76  Temp 98.2 F (36.8 C)  Resp 16  Ht 5\' 4"  (1.626 m)  Wt 146 lb (66.225 kg)  BMI 25.06 kg/m2       Objective:   Physical Exam  Nursing note and vitals reviewed. Constitutional: She is oriented to person, place, and time. She appears well-developed and well-nourished. No distress.  HENT:  Head: Normocephalic and atraumatic.  Right Ear: External ear normal.  Left Ear: External ear normal.  Nose: Nose normal.  Mouth/Throat: Oropharynx is clear and moist.  Eyes: Conjunctivae normal and EOM are normal. Pupils are equal, round, and reactive to light.  Neck: Normal range of motion. Neck supple. No JVD present. No tracheal deviation present. No thyromegaly present.  Cardiovascular: Normal rate, regular rhythm and intact distal pulses.   Murmur heard. Pulmonary/Chest: Effort normal and breath sounds normal. She has no wheezes. She exhibits no tenderness.  Abdominal: Soft. Bowel sounds are normal.  Musculoskeletal: Normal range of motion. She exhibits no edema and no tenderness.  Lymphadenopathy:    She has no cervical adenopathy.  Neurological: She is alert and oriented to person, place, and time. She has normal reflexes. No cranial nerve deficit.  Skin: Skin is warm and dry. She is not diaphoretic.  Psychiatric: She has a normal mood and affect. Her behavior is normal.          Assessment & Plan:  Mild CHF is complicated by moderate renal insufficiency probably stage II and the diuretic has resulted in a stable weight and but some persistent shortness of breath. She may require changing from Lasix to [facility] for better diuresis and her blood pressure stable her current medications her rate is stable. Reviewed her blood work and discuss the results of the laboratories with her

## 2011-11-18 ENCOUNTER — Ambulatory Visit (INDEPENDENT_AMBULATORY_CARE_PROVIDER_SITE_OTHER): Payer: Medicare Other | Admitting: Family

## 2011-11-18 DIAGNOSIS — I4891 Unspecified atrial fibrillation: Secondary | ICD-10-CM

## 2011-11-18 LAB — POCT INR: INR: 2

## 2011-11-18 NOTE — Patient Instructions (Addendum)
Same dose, 5mg., 2.5mg., 2.5mg. Alternating, Check in 6 weeks.    Latest dosing instructions   Total Sun Mon Tue Wed Thu Fri Sat   25 5 mg 2.5 mg 2.5 mg 5 mg 2.5 mg 2.5 mg 5 mg    (5 mg1) (5 mg0.5) (5 mg0.5) (5 mg1) (5 mg0.5) (5 mg0.5) (5 mg1)        

## 2011-11-23 ENCOUNTER — Encounter: Payer: Self-pay | Admitting: Nurse Practitioner

## 2011-11-23 ENCOUNTER — Ambulatory Visit (INDEPENDENT_AMBULATORY_CARE_PROVIDER_SITE_OTHER): Payer: Medicare Other | Admitting: Nurse Practitioner

## 2011-11-23 VITALS — BP 110/66 | HR 68 | Ht 63.0 in | Wt 146.4 lb

## 2011-11-23 DIAGNOSIS — R0600 Dyspnea, unspecified: Secondary | ICD-10-CM

## 2011-11-23 DIAGNOSIS — R0609 Other forms of dyspnea: Secondary | ICD-10-CM

## 2011-11-23 LAB — BASIC METABOLIC PANEL
BUN: 25 mg/dL — ABNORMAL HIGH (ref 6–23)
CO2: 27 mEq/L (ref 19–32)
Calcium: 9 mg/dL (ref 8.4–10.5)
Chloride: 105 mEq/L (ref 96–112)
Creatinine, Ser: 1.6 mg/dL — ABNORMAL HIGH (ref 0.4–1.2)
GFR: 34.39 mL/min — ABNORMAL LOW (ref >60.00)
Glucose, Bld: 79 mg/dL (ref 70–99)
Potassium: 4 mEq/L (ref 3.5–5.1)
Sodium: 140 mEq/L (ref 135–145)

## 2011-11-23 LAB — BRAIN NATRIURETIC PEPTIDE: Pro B Natriuretic peptide (BNP): 1159 pg/mL — ABNORMAL HIGH (ref 0.0–100.0)

## 2011-11-23 NOTE — Patient Instructions (Addendum)
We will arrange for a breathing test and will send you to the lung doctor for your shortness of breath.  We need to check labs today.   You are probably allergic to Penicillin  Stay on your current medicines  Call the [facility] office at [phone] if you have any questions, problems or concerns.

## 2011-11-23 NOTE — Progress Notes (Signed)
Sandra [name] Date of Birth: 12-20-1932 Medical Record #[id number]  History of Present Illness: Sandra [name] is seen back today for a follow up visit. She is seen for Dr. Graciela Husbands. She has a history of atrial fib on Tikosyn and on chronic anticoagulation. She also has a hypertrophic CM with decreased LV function. Has a CRT-D in place with generator change out earlier this year.   I saw her a month ago with dyspnea. We updated her echo. It was felt to be unchanged.  BNP was up some and we increased her Lasix.   She comes back today. She is here alone. Says she continues to be short of breath. She went on her trip to [place] and did "just fine". Came home and started feeling bad again. Has a lot of stress at home with her husband's illnesses. No chest pain. Her weight is stable. She has no swelling. No cough. She does have a past tobacco history. Stopped about 25 years ago. Had remote asthma as well. She is more upset today with the state of the health care benefits with the government that are playing out. She has had some issues with her bridge in her mouth and was given a couple doses of amoxicillin and then got a rash on her trunk. It is now fading.   Current Outpatient Prescriptions on File Prior to Visit  Medication Sig Dispense Refill  . benazepril (LOTENSIN) 10 MG tablet TAKE ONE TABLET BY MOUTH TWICE DAILY  180 tablet  3  . calcipotriene (DOVONOX) 0.005 % cream Apply topically as needed.       . carvedilol (COREG) 12.5 MG tablet Take 1 tablet (12.5 mg total) by mouth 2 (two) times daily with a meal.  180 tablet  3  . clorazepate (TRANXENE) 7.5 MG tablet Take 1 tablet (7.5 mg total) by mouth 2 (two) times daily as needed.  60 tablet  3  . dofetilide (TIKOSYN) 125 MCG capsule Take 125 mcg by mouth 2 (two) times daily. Total of 375 bid      . dofetilide (TIKOSYN) 250 MCG capsule Take 250 mcg by mouth 2 (two) times daily. Total of 375 bid      . ezetimibe (ZETIA) 10 MG tablet Take 1 tablet (10 mg  total) by mouth daily.  90 tablet  3  . furosemide (LASIX) 20 MG tablet Take two and a half tablets by mouth daily.      Marland Kitchen HYDROcodone-homatropine (HYCODAN) 5-1.5 MG/5ML syrup TAKE ONE TEASPOONFUL BY MOUTH TWICE DAILY AS NEEDED FOR COUGH  180 mL  1  . metoprolol succinate (TOPROL-XL) 50 MG 24 hr tablet Take 1/2 tablet by mouth as needed      . potassium chloride SA (KLOR-CON M20) 20 MEQ tablet Take 1 tablet (20 mEq total) by mouth daily.  30 tablet  0  . PROCTOSOL HC 2.5 % rectal cream USE AS DIRECTED  29 g  3  . SYNTHROID 100 MCG tablet TAKE ONE TABLET BY MOUTH EVERY DAY  90 each  3  . vitamin E 400 UNIT capsule Take 400 Units by mouth daily.        Marland Kitchen warfarin (COUMADIN) 5 MG tablet AS DIRECTED  90 tablet  3    Allergies  Allergen Reactions  . Amoxicillin     Rash   . Codeine     REACTION: nausea  . Niacin     REACTION: rash  . Procaine Hcl     REACTION: smothers  . Ramipril  REACTION: unspecified  . Statins     REACTION: muscle aches    Past Medical History  Diagnosis Date  . Atrial fibrillation     on Tikosyn and Coumadin  . Hyperlipidemia   . Hypothyroidism   . Bradycardia   . Cardiomyopathy, hypertrophic nonobstructive     ejection fraction 25%  . Anal fissure   . Adenomatous colon polyp   . Diverticulosis   . AICD (automatic cardioverter/defibrillator) present   . Systolic heart failure   . History of colonoscopy   . Heart murmur     Past Surgical History  Procedure Date  . Cardioversion   . Cardiac defibrillator placement     History  Smoking status  . Former Smoker  Smokeless tobacco  . Not on file    History  Alcohol Use No    Family History  Problem Relation Age of Onset  . Heart disease Mother   . Cancer Father     colon  . COPD Father   . Heart disease Father   . Cardiomyopathy Daughter   . Cancer Paternal Aunt     BREAST    Review of Systems: The review of systems is per the HPI.  All other systems were reviewed and are  negative.  Physical Exam: BP 110/66  Pulse 68  Ht 5\' 3"  (1.6 m)  Wt 146 lb 6.4 oz (66.407 kg)  BMI 25.93 kg/m2 Patient is very pleasant and in no acute distress. She is quite talkative. Skin is warm and dry. Color is normal.  HEENT is unremarkable. Normocephalic/atraumatic. PERRL. Sclera are nonicteric. Neck is supple. No masses. No JVD. Lungs are clear. Cardiac exam shows a regular rate and rhythm. Abdomen is soft. Extremities are without edema. Gait and ROM are intact. No gross neurologic deficits noted.  LABORATORY DATA: PENDING   Echo Study Conclusions  - Left ventricle: The cavity size was normal. Wall thickness was normal. The estimated ejection fraction was 30%. Diffuse hypokinesis. Features are consistent with a pseudonormal left ventricular filling pattern, with concomitant abnormal relaxation and increased filling pressure (grade 2 diastolic dysfunction). - Aortic valve: There was no stenosis. Trivial regurgitation. - Mitral valve: Trivial regurgitation. - Left atrium: The atrium was moderately dilated. - Right ventricle: The cavity size was mildly dilated. Pacer wire or catheter noted in right ventricle. Systolic function was mildly reduced. - Right atrium: The atrium was moderately dilated. - Tricuspid valve: Peak RV-RA gradient:19mm Hg (S). - Pulmonary arteries: PA peak pressure: 67mm Hg (S). - Systemic veins: IVC measures 1.9 cm with some respirophasic variation, suggesting RA pressure 10 mmHg.  Lab Results  Component Value Date   WBC 6.8 10/22/2011   HGB 11.7* 10/22/2011   HCT 36.1 10/22/2011   PLT 158.0 10/22/2011   GLUCOSE 71 10/22/2011   CHOL 231* 08/04/2011   TRIG 220.0* 08/04/2011   HDL 42.30 08/04/2011   LDLDIRECT 158.0 08/04/2011   LDLCALC 124* 06/24/2009   ALT 30 06/24/2009   AST 35 06/24/2009   NA 141 10/22/2011   K 3.7 10/22/2011   CL 107 10/22/2011   CREATININE 1.5* 10/22/2011   BUN 32* 10/22/2011   CO2 27 10/22/2011   TSH 0.25* 10/22/2011    INR 2.0 11/18/2011   Assessment / Plan: 1. Dyspnea - probably multifactorial. She does not look like she is in heart failure. Her weights have been stable. We will recheck her labs today. I have left her on her current dose of medicines. Will arrange for PFT's and  refer to pulmonary for further evaluation.   2. PAF - on Tikosyn.   3. CRT-D in place.   4. Stress - in regards to her family issues. This may be more of the problem.  5. Rash - possibly from PCN. I have listed as an allergy.   Patient is agreeable to this plan and will call if any problems develop in the interim.

## 2011-11-27 ENCOUNTER — Encounter (INDEPENDENT_AMBULATORY_CARE_PROVIDER_SITE_OTHER): Payer: Self-pay | Admitting: Surgery

## 2011-11-27 ENCOUNTER — Ambulatory Visit (INDEPENDENT_AMBULATORY_CARE_PROVIDER_SITE_OTHER): Payer: Medicare Other | Admitting: Surgery

## 2011-11-27 VITALS — BP 102/62 | HR 68 | Resp 14 | Ht 63.0 in | Wt 146.0 lb

## 2011-11-27 DIAGNOSIS — N6019 Diffuse cystic mastopathy of unspecified breast: Secondary | ICD-10-CM

## 2011-11-27 NOTE — Progress Notes (Signed)
CHIEF COMPLAINT Breast followup.  History of present illness: This patient has had fibrocystic issues and I followed her annually almost since 1993. She's had no changes since I saw her last year. I told her she could simply followup with her primary care but she wants to be seen here.  EXAMINATION: GENERAL: The patient is alert, oriented, healthy appearing. BREASTS: The breasts are symmetric in appearance. There is a deformity from her pacer on the left. There is no mass, tenderness, nipple or skin changes noted. There is mild fibrocystic regularity which has been present as long as I can remember.  LYMPHATICS: There is no axillary or supraclavicular adenopathy on either side.  DATA REVIEWED Mammogram was done in June, 2013 @ [facility] and is negative (scanned report in Epic).  IMPRESSION Stable exam no suspicious areas noted.  PLANNED I will continue to follow her here and yearly. We will see her p.r.n. if she finds any abnormality in her breasts.

## 2011-11-27 NOTE — Patient Instructions (Addendum)
Continue annual mammograms  

## 2011-12-07 ENCOUNTER — Ambulatory Visit (INDEPENDENT_AMBULATORY_CARE_PROVIDER_SITE_OTHER): Payer: Medicare Other | Admitting: Emergency Medicine

## 2011-12-07 ENCOUNTER — Encounter: Payer: Self-pay | Admitting: Emergency Medicine

## 2011-12-07 ENCOUNTER — Ambulatory Visit (INDEPENDENT_AMBULATORY_CARE_PROVIDER_SITE_OTHER)
Admission: RE | Admit: 2011-12-07 | Discharge: 2011-12-07 | Disposition: A | Discharge status: Home / Self Care | Payer: Medicare Other | Source: Ambulatory Visit | Attending: Emergency Medicine | Admitting: Emergency Medicine

## 2011-12-07 VITALS — BP 122/80 | HR 70 | Temp 97.8°F | Ht 63.0 in | Wt 147.6 lb

## 2011-12-07 DIAGNOSIS — R0609 Other forms of dyspnea: Secondary | ICD-10-CM

## 2011-12-07 DIAGNOSIS — R0600 Dyspnea, unspecified: Secondary | ICD-10-CM | POA: Insufficient documentation

## 2011-12-07 NOTE — Progress Notes (Signed)
Subjective:    Patient ID: Sandra [name], female    DOB: January 27, 1932, 78 y.o.   MRN: [id number]  HPI 78 yo former smoker, Hx of HTN, A Fib, HOCM with EF 25%. She has had SOB for many years - it has been largely stable. About 2 months ago she began to have more SOB, especially when she lays down for bed. Also hears some UA noise / wheeze at that time. Her lasix has been titrated up with improvement in edema but no real change in SOB. She has never been dx with asthma before. She is able to do housework, gets tired w exertion if she doesn't "pace herself". She isn't really sure that the breathing is any worse than her usual baseline.    Review of Systems  Constitutional: Positive for fatigue. Negative for fever, chills, diaphoresis and unexpected weight change.  HENT: Positive for rhinorrhea. Negative for nosebleeds, congestion, sore throat, sneezing, trouble swallowing, neck stiffness and postnasal drip.   Eyes: Negative for visual disturbance.  Respiratory: Positive for shortness of breath and wheezing. Negative for cough, choking and chest tightness.   Cardiovascular: Positive for palpitations and leg swelling. Negative for chest pain.  Gastrointestinal: Negative for nausea, vomiting, abdominal pain, constipation and abdominal distention.  Genitourinary: Negative for dysuria.  Musculoskeletal: Negative for myalgias, back pain, joint swelling, arthralgias and gait problem.  Skin: Negative for color change, pallor and rash.  Neurological: Positive for headaches. Negative for dizziness, tremors, seizures, syncope, weakness and light-headedness.  Hematological: Negative for adenopathy. Does not bruise/bleed easily.  Psychiatric/Behavioral: Negative for confusion, sleep disturbance and dysphoric mood. The patient is not nervous/anxious.     Past Medical History  Diagnosis Date  . Atrial fibrillation     on Tikosyn and Coumadin  . Hyperlipidemia   . Hypothyroidism   . Bradycardia   .  Cardiomyopathy, hypertrophic nonobstructive     ejection fraction 25%  . Anal fissure   . Adenomatous colon polyp   . Diverticulosis   . AICD (automatic cardioverter/defibrillator) present   . Systolic heart failure   . History of colonoscopy   . Heart murmur   . Migraines   . Hyperlipidemia   . Hypertension      Family History  Problem Relation Age of Onset  . Heart disease Mother   . Cancer Father     colon  . COPD Father   . Heart disease Father   . Cardiomyopathy Daughter   . Cancer Paternal Aunt     BREAST     History   Social History  . Marital Status: Married    Spouse Name: N/A    Number of Children: 3  . Years of Education: N/A   Occupational History  . Retired     Engineer, petroleum   Social History Main Topics  . Smoking status: Former Smoker -- 0.2 packs/day for 19 years    Types: Cigarettes    Quit date: 01/13/1987  . Smokeless tobacco: Not on file     Comment: 1-2 cigs per day  . Alcohol Use: No  . Drug Use: No  . Sexually Active: Not Currently   Other Topics Concern  . Not on file   Social History Narrative  . No narrative on file     Allergies  Allergen Reactions  . Amoxicillin     Rash   . Codeine     REACTION: nausea  . Niacin     REACTION: rash  . Procaine Hcl  REACTION: smothers  . Ramipril     REACTION: unspecified  . Statins     REACTION: muscle aches     Outpatient Prescriptions Prior to Visit  Medication Sig Dispense Refill  . benazepril (LOTENSIN) 10 MG tablet TAKE ONE TABLET BY MOUTH TWICE DAILY  180 tablet  3  . carvedilol (COREG) 12.5 MG tablet Take 1 tablet (12.5 mg total) by mouth 2 (two) times daily with a meal.  180 tablet  3  . dofetilide (TIKOSYN) 125 MCG capsule Take 125 mcg by mouth 2 (two) times daily. Total of 375 bid      . dofetilide (TIKOSYN) 250 MCG capsule Take 250 mcg by mouth 2 (two) times daily. Total of 375 bid      . ezetimibe (ZETIA) 10 MG tablet Take 1 tablet (10 mg total) by mouth daily.   90 tablet  3  . furosemide (LASIX) 20 MG tablet Take 1/2 pill in AM and 1 whole pill in PM      . HYDROcodone-homatropine (HYCODAN) 5-1.5 MG/5ML syrup TAKE ONE TEASPOONFUL BY MOUTH TWICE DAILY AS NEEDED FOR COUGH  180 mL  1  . metoprolol succinate (TOPROL-XL) 50 MG 24 hr tablet Take 1/2 tablet by mouth as needed      . PROCTOSOL HC 2.5 % rectal cream USE AS DIRECTED  29 g  3  . SYNTHROID 100 MCG tablet TAKE ONE TABLET BY MOUTH EVERY DAY  90 each  3  . vitamin E 400 UNIT capsule Take 400 Units by mouth daily.        Marland Kitchen warfarin (COUMADIN) 5 MG tablet AS DIRECTED  90 tablet  3  . calcipotriene (DOVONOX) 0.005 % cream Apply topically as needed.       . clorazepate (TRANXENE) 7.5 MG tablet Take 1 tablet (7.5 mg total) by mouth 2 (two) times daily as needed.  60 tablet  3         Objective:   Physical Exam Filed Vitals:   12/07/11 1442  BP: 122/80  Pulse: 70  Temp: 97.8 F (36.6 C)   Gen: Pleasant, well-nourished, in no distress,  normal affect  ENT: No lesions,  mouth clear,  oropharynx clear, no postnasal drip  Neck: No JVD, no TMG, no carotid bruits  Lungs: No use of accessory muscles, no dullness to percussion, clear without rales or rhonchi  Cardiovascular: RRR, heart sounds normal, no murmur or gallops, no peripheral edema  Musculoskeletal: No deformities, no cyanosis or clubbing  Neuro: alert, non focal  Skin: Warm, no lesions or rashes  10/01/10 --  Comparison: 10/29/2008  Findings: Lungs are clear. The cardiopericardial silhouette is  enlarged. Left-sided triple lead pacer / AICD noted. The  appearance is stable. There is no evidence for fraying or  discontinuity of the leads. Imaged bony structures of the thorax  are intact.  IMPRESSION:  No acute cardiopulmonary process.  No evidence for lead discontinuity or irregularity/fraying.      Assessment & Plan:  Dyspnea Etiology unclear, but she has several cardiac components that may be contributing. No overt  signs of asthma, but would like to rule out occult AFL. Her CXR in 2012 was reassuring.  - full PFT  - CXR - rov next available

## 2011-12-07 NOTE — Assessment & Plan Note (Signed)
Etiology unclear, but she has several cardiac components that may be contributing. No overt signs of asthma, but would like to rule out occult AFL. Her CXR in 2012 was reassuring.  - full PFT  - CXR - rov next available

## 2011-12-07 NOTE — Patient Instructions (Signed)
We will perform your pulmonary function testing  Follow with Dr Delton Coombes next available with full PFT.

## 2011-12-24 ENCOUNTER — Encounter: Payer: Self-pay | Admitting: *Deleted

## 2011-12-30 ENCOUNTER — Ambulatory Visit (INDEPENDENT_AMBULATORY_CARE_PROVIDER_SITE_OTHER): Payer: Medicare Other | Admitting: *Deleted

## 2011-12-30 ENCOUNTER — Encounter: Payer: Self-pay | Admitting: Internal Medicine

## 2011-12-30 ENCOUNTER — Encounter: Payer: Medicare Other | Admitting: Family

## 2011-12-30 ENCOUNTER — Ambulatory Visit (INDEPENDENT_AMBULATORY_CARE_PROVIDER_SITE_OTHER): Payer: Medicare Other | Admitting: Family

## 2011-12-30 DIAGNOSIS — I5022 Chronic systolic (congestive) heart failure: Secondary | ICD-10-CM

## 2011-12-30 DIAGNOSIS — Z7901 Long term (current) use of anticoagulants: Secondary | ICD-10-CM

## 2011-12-30 DIAGNOSIS — I4891 Unspecified atrial fibrillation: Secondary | ICD-10-CM

## 2011-12-30 DIAGNOSIS — I421 Obstructive hypertrophic cardiomyopathy: Secondary | ICD-10-CM

## 2011-12-30 LAB — ICD DEVICE OBSERVATION
AL AMPLITUDE: 2.8 mv
AL THRESHOLD: 1 V
BAMS-0003: 70 {beats}/min
FVT: 0
HV IMPEDENCE: 37 Ohm
PACEART VT: 0
RV LEAD AMPLITUDE: 12 mv
RV LEAD IMPEDENCE ICD: 325 Ohm
TOT-0009: 0
TOT-0010: 4
TZAT-0001FASTVT: 1
TZAT-0004SLOWVT: 8
TZAT-0012FASTVT: 200 ms
TZAT-0012SLOWVT: 200 ms
TZAT-0013SLOWVT: 3
TZAT-0018SLOWVT: NEGATIVE
TZAT-0019FASTVT: 7.5 V
TZAT-0019SLOWVT: 7.5 V
TZAT-0020FASTVT: 1 ms
TZON-0003SLOWVT: 350 ms
TZON-0004FASTVT: 30
TZON-0004SLOWVT: 40
TZON-0005FASTVT: 6
TZON-0010FASTVT: 40 ms
TZST-0001FASTVT: 4
TZST-0001SLOWVT: 3
TZST-0003FASTVT: 30 J
VF: 0

## 2011-12-30 LAB — POCT INR: INR: 1.9

## 2011-12-30 NOTE — Progress Notes (Signed)
ICD check with [employer] 

## 2011-12-30 NOTE — Patient Instructions (Signed)
Take an additional 1/2 tab today only. Then continue Same dose, 5mg ., 2.5mg ., 2.5mg . Alternating, Check in 4 weeks.    Latest dosing instructions   Total Glynis Smiles Tue Wed Thu Fri Sat   25 5 mg 2.5 mg 2.5 mg 5 mg 2.5 mg 2.5 mg 5 mg    (5 mg1) (5 mg0.5) (5 mg0.5) (5 mg1) (5 mg0.5) (5 mg0.5) (5 mg1)

## 2012-01-11 ENCOUNTER — Telehealth: Payer: Self-pay | Admitting: Internal Medicine

## 2012-01-11 NOTE — Telephone Encounter (Signed)
Patient called stated she wanted appointment with Dr.Klein.States she has no energy and thinks her ICD needs to be checked.States she also has sob with exertion and is scheduled to have PFT's 01/14/12 and appointment with Dr.Byrum 01/14/12.States she has appointment with Dr.Klein 03/31/12 and wants to be seen sooner.Message sent to Dr.Klein's nurse.

## 2012-01-11 NOTE — Telephone Encounter (Signed)
Pt was wondering if her defib can be tweeked a little cause she can hardly go

## 2012-01-14 ENCOUNTER — Encounter: Payer: Self-pay | Admitting: Emergency Medicine

## 2012-01-14 ENCOUNTER — Ambulatory Visit (INDEPENDENT_AMBULATORY_CARE_PROVIDER_SITE_OTHER): Payer: Medicare Other | Admitting: Emergency Medicine

## 2012-01-14 VITALS — BP 124/82 | HR 68 | Temp 97.1°F | Ht 63.0 in | Wt 146.0 lb

## 2012-01-14 DIAGNOSIS — R0600 Dyspnea, unspecified: Secondary | ICD-10-CM

## 2012-01-14 DIAGNOSIS — R0609 Other forms of dyspnea: Secondary | ICD-10-CM

## 2012-01-14 DIAGNOSIS — R0989 Other specified symptoms and signs involving the circulatory and respiratory systems: Secondary | ICD-10-CM

## 2012-01-14 LAB — PULMONARY FUNCTION TEST

## 2012-01-14 NOTE — Assessment & Plan Note (Signed)
She had PFT today - no real AFL. The UA sx she describes are more consistent with VCD > happens when she has a cold or if she "gets choked up". I highly doubt the chronic exertional SOB that she is experiencing is due to an underlying lung or airways problem. We could try an empiric SABA to see if it gives her relief. She is willing to do this. I have encouraged her to speak again with Cardiology to see if there are adjustments that they can make as well.

## 2012-01-14 NOTE — Progress Notes (Signed)
  Subjective:    Patient ID: Sandra [name], female    DOB: 1932-02-02, 79 y.o.   MRN: [id number]  HPI 79 yo former smoker, Hx of HTN, A Fib, HOCM with EF 25%. She has had SOB for many years - it has been largely stable. About 2 months ago she began to have more SOB, especially when she lays down for bed. Also hears some UA noise / wheeze at that time. Her lasix has been titrated up with improvement in edema but no real change in SOB. She has never been dx with asthma before. She is able to do housework, gets tired w exertion if she doesn't "pace herself". She isn't really sure that the breathing is any worse than her usual baseline.   ROV 01/14/12 -- follow up for dyspnea, some nocturnal sx including wheeze, significant cardiac disease but also a hx of tobacco.  She had PFT today: Normal airflow but with some curve to F-V loop and some evidence for small airways disease. No bronchodilator response or overt evidence asthma.      Objective:   Physical Exam Filed Vitals:   01/14/12 1355  BP: 124/82  Pulse: 68  Temp: 97.1 F (36.2 C)   Gen: Pleasant, well-nourished, in no distress,  normal affect  ENT: No lesions,  mouth clear,  oropharynx clear, no postnasal drip  Neck: No JVD, no TMG, no carotid bruits  Lungs: No use of accessory muscles, no dullness to percussion, clear without rales or rhonchi  Cardiovascular: RRR, heart sounds normal, no murmur or gallops, no peripheral edema  Musculoskeletal: No deformities, no cyanosis or clubbing  Neuro: alert, non focal  Skin: Warm, no lesions or rashes  10/01/10 --  Comparison: 10/29/2008  Findings: Lungs are clear. The cardiopericardial silhouette is  enlarged. Left-sided triple lead pacer / AICD noted. The  appearance is stable. There is no evidence for fraying or  discontinuity of the leads. Imaged bony structures of the thorax  are intact.  IMPRESSION:  No acute cardiopulmonary process.  No evidence for lead discontinuity or  irregularity/fraying.      Assessment & Plan:  Dyspnea She had PFT today - no real AFL. The UA sx she describes are more consistent with VCD > happens when she has a cold or if she "gets choked up". I highly doubt the chronic exertional SOB that she is experiencing is due to an underlying lung or airways problem. We could try an empiric SABA to see if it gives her relief. She is willing to do this. I have encouraged her to speak again with Cardiology to see if there are adjustments that they can make as well.

## 2012-01-14 NOTE — Progress Notes (Signed)
PFT done today. 

## 2012-01-14 NOTE — Patient Instructions (Addendum)
Your breathing testing shows good lung function.  We will try an inhaled medication to see if it can improve your breathing with exertion.  I agree with continued conversation with Cardiology to see if there is any adjustment to be made Follow with Dr Delton Coombes in 6 months

## 2012-01-15 NOTE — Telephone Encounter (Signed)
Will forward to Glynda Jaeger to reschedule pt sooner if time available.  Please contact pt to schedule.

## 2012-01-19 ENCOUNTER — Telehealth: Payer: Self-pay | Admitting: Internal Medicine

## 2012-01-19 DIAGNOSIS — R0602 Shortness of breath: Secondary | ICD-10-CM

## 2012-01-19 NOTE — Telephone Encounter (Signed)
I spoke with the patient. She states that she has continued to have SOB. She saw Norma Fredrickson, NP back on 11/23/11 and was set up for PFT's and an appointment with Dr. Delton Coombes. Per the patient, her lung tests were ok. She still complains of SOB with walking and feels her heart racing for about 10 seconds when she lays down at night. She states that the last time her device was interrogated, she was told she was "using too much power," and the settings were adjusted a little. I advised I would discuss with Dr. Graciela Husbands to see if she needs to be seen for possible adjustment of her device. I will call her back after reviewing with the MD. She is agreeable.

## 2012-01-19 NOTE — Telephone Encounter (Signed)
Pt would like test results and to tell you how she is feeling

## 2012-01-20 NOTE — Telephone Encounter (Signed)
Per Dr. Graciela Husbands, get a 24 holter and a 12 lead EKG, and BNP level. I have notified the patient and will call her back on Friday when I can speak with the holter room and find out what day I can bring the patient in on a day Dr. Graciela Husbands will be in the office. The patient is agreeable.

## 2012-01-27 ENCOUNTER — Ambulatory Visit (INDEPENDENT_AMBULATORY_CARE_PROVIDER_SITE_OTHER): Payer: Medicare Other | Admitting: Family

## 2012-01-27 ENCOUNTER — Other Ambulatory Visit: Payer: Self-pay | Admitting: *Deleted

## 2012-01-27 DIAGNOSIS — I4891 Unspecified atrial fibrillation: Secondary | ICD-10-CM

## 2012-01-27 LAB — POCT INR: INR: 2

## 2012-01-27 NOTE — Telephone Encounter (Signed)
I spoke with the patient. She will come around 9:30 am on Friday for a 24 hour e-cardia, EKG, bmp/bnp. She also states we should have gotten a fax for a refill request for her Tikosyn. I have not seen this. I will call her mail order pharmacy Friday to see if this has been done. [phone].

## 2012-01-27 NOTE — Patient Instructions (Addendum)
Continue Same dose 1 tablet Sun, Wed and Sat then 0.5 tablet Mon, Tues, Thur, Fri.  Recheck in 4 weeks    Latest dosing instructions   Total Sun Mon Tue Wed Thu Fri Sat   25 5 mg 2.5 mg 2.5 mg 5 mg 2.5 mg 2.5 mg 5 mg    (5 mg1) (5 mg0.5) (5 mg0.5) (5 mg1) (5 mg0.5) (5 mg0.5) (5 mg1)

## 2012-01-27 NOTE — Telephone Encounter (Signed)
I attempted to call the patient. Per her husband, she is in [place]. I will attempt to call her back later. I have spoken with Rose and she can put a 24 hour holter (e-cardio) on her on Friday morning between 8:00-9:30am. She will need an EKG which I can do since I am on follow up. She will need a BMP as well. I will try to call her back again before I leave today. I will forward to triage to call her tomorrow if I can't reach her today.

## 2012-01-29 ENCOUNTER — Ambulatory Visit (INDEPENDENT_AMBULATORY_CARE_PROVIDER_SITE_OTHER): Payer: Medicare Other

## 2012-01-29 ENCOUNTER — Other Ambulatory Visit (INDEPENDENT_AMBULATORY_CARE_PROVIDER_SITE_OTHER): Payer: Medicare Other

## 2012-01-29 ENCOUNTER — Encounter: Payer: Self-pay | Admitting: *Deleted

## 2012-01-29 ENCOUNTER — Ambulatory Visit (INDEPENDENT_AMBULATORY_CARE_PROVIDER_SITE_OTHER): Payer: Medicare Other | Admitting: *Deleted

## 2012-01-29 VITALS — HR 65 | Resp 18

## 2012-01-29 DIAGNOSIS — I4891 Unspecified atrial fibrillation: Secondary | ICD-10-CM

## 2012-01-29 DIAGNOSIS — R0602 Shortness of breath: Secondary | ICD-10-CM

## 2012-01-29 LAB — BASIC METABOLIC PANEL
BUN: 33 mg/dL — ABNORMAL HIGH (ref 6–23)
Chloride: 106 mEq/L (ref 96–112)
Glucose, Bld: 72 mg/dL (ref 70–99)
Potassium: 3.8 mEq/L (ref 3.5–5.1)

## 2012-01-29 MED ORDER — DOFETILIDE 250 MCG PO CAPS: 250.0000 ug | ORAL_CAPSULE | Freq: Two times a day (BID) | ORAL | Status: DC

## 2012-01-29 MED ORDER — DOFETILIDE 125 MCG PO CAPS: 125.0000 ug | ORAL_CAPSULE | Freq: Two times a day (BID) | ORAL | Status: DC

## 2012-01-29 NOTE — Progress Notes (Signed)
Placed a 24 hr holter on patient and went over instructions on how to use it and when to return it

## 2012-01-29 NOTE — Telephone Encounter (Signed)
The patient came in this morning for her EKG/ lab/ holter. I called [facility] and spoke with Raiford Noble. I have authorized refills for Tikosyn 125 mcg one capsule BID & Tikosyn 250 mcg one capsule BID #180 with 3 RF's for each. The patient is aware. She should have these by Wednesday.

## 2012-01-29 NOTE — Progress Notes (Signed)
The patient was in the office this morning for an EKG/ BMP/BNP/ & 24 hour holter due to SOB. She states she feels some better than she has over the last few months. Will await the results of her test to see if her device may need to be reprogrammed.

## 2012-01-29 NOTE — Addendum Note (Signed)
Addended by: Yolonda Kida on: 01/29/2012 04:27 PM   Modules accepted: Orders

## 2012-02-06 ENCOUNTER — Other Ambulatory Visit: Payer: Self-pay | Admitting: Internal Medicine

## 2012-02-12 ENCOUNTER — Telehealth: Payer: Self-pay | Admitting: *Deleted

## 2012-02-12 NOTE — Telephone Encounter (Signed)
Lets ave her comee in thnaks ----- Message ----- From: Jefferey Pica, RN Sent: 01/29/2012 10:27 AM To: Duke Salvia, MD   I have left a message for the patient to call. Sherri Rad, RN, BSN

## 2012-02-15 ENCOUNTER — Encounter: Payer: Self-pay | Admitting: Internal Medicine

## 2012-02-15 ENCOUNTER — Ambulatory Visit (INDEPENDENT_AMBULATORY_CARE_PROVIDER_SITE_OTHER): Payer: Medicare Other | Admitting: Internal Medicine

## 2012-02-15 ENCOUNTER — Telehealth: Payer: Self-pay | Admitting: Internal Medicine

## 2012-02-15 VITALS — BP 139/85 | HR 65 | Ht 63.0 in | Wt 147.0 lb

## 2012-02-15 DIAGNOSIS — I4891 Unspecified atrial fibrillation: Secondary | ICD-10-CM

## 2012-02-15 DIAGNOSIS — I421 Obstructive hypertrophic cardiomyopathy: Secondary | ICD-10-CM

## 2012-02-15 DIAGNOSIS — Z95810 Presence of automatic (implantable) cardiac defibrillator: Secondary | ICD-10-CM

## 2012-02-15 DIAGNOSIS — I5022 Chronic systolic (congestive) heart failure: Secondary | ICD-10-CM

## 2012-02-15 DIAGNOSIS — E039 Hypothyroidism, unspecified: Secondary | ICD-10-CM

## 2012-02-15 NOTE — Assessment & Plan Note (Signed)
I suspect that this represents systolic heart failure. The orthopnea somewhat atypical in that it seems to be worse upon lying down and then abates while she continues to lie down. The fact that it is mitigated by lying on more pillows speaks too orthopneic component. I worry a little bit about neuromuscular issues as diaphragmatic compression can occur with lying; this too however, I would think would worsen with prolonged lying flat. No neuro fasciculations were noted today  Her BNP has been elevated. Her BUN and creatinine makes it a little bit treacherous in terms of augmenting diuresis. We'll undertake a short one week trial of increase her Lasix from 60-80. We will undertake an AV optimization echo.  In the event that these interventions are not helpful, I think I would recommend right and left heart catheterization

## 2012-02-15 NOTE — Assessment & Plan Note (Signed)
No intercurrent atrial fibrillation of any note

## 2012-02-15 NOTE — Assessment & Plan Note (Signed)
The patient has hypertrophic cardiomyopathy with significant left ventricular systolic dysfunction with an EF of 35-40%. There is evidence of pulmonary hypertension on echo with right-sided chamber enlargement

## 2012-02-15 NOTE — Assessment & Plan Note (Signed)
Her TSH has been elevated for some time. I see also that her T4 was elevated. These were apparently sent for PCP but no adjustment of Synthroid is evident. We will check her TSH free T3 and free T4 at her next visit.

## 2012-02-15 NOTE — Telephone Encounter (Signed)
PT RTN CALL TO HEATHER

## 2012-02-15 NOTE — Patient Instructions (Addendum)
Your physician has recommended you make the following change in your medication: INCREASE your lasix to 80 mg for one week  Your physician has requested that you have an echocardiogram on a day when Dr Graciela Husbands is in the office. Echocardiography is a painless test that uses sound waves to create images of your heart. It provides your doctor with information about the size and shape of your heart and how well your heart's chambers and valves are working. This procedure takes approximately one hour. There are no restrictions for this procedure.  Your physician recommends that you return for lab work in: when you return for the echocardiogram on a day when Dr Graciela Husbands is in the office (bmet, tsh, free t4, free t3)

## 2012-02-15 NOTE — Telephone Encounter (Signed)
Appt scheduled today with Dr Graciela Husbands.

## 2012-02-15 NOTE — Assessment & Plan Note (Signed)
The patient's device was interrogated.  The information was reviewed. No changes were made in the programming.    

## 2012-02-15 NOTE — Progress Notes (Signed)
Patient Care Team: [name], MD as PCP - General Currie Paris, MD (General Surgery)   HPI  Sandra [name] is a 79 y.o. female seen in followup for congestive heart failure in the setting of hypertrophic cardiomyopathy with depressed left ventricular function. She is status post CRT-D implantation.    She is on Coumadin. Because of recurrences of atrial fibrillation   she is also a put on dofetilide in February.   She underwent device generator replacement 3/13 and for the next 6 months did very well. Over the last 4-5 months she has had problems with a variety of complaints related to dyspnea. There has been some with exertion. She also notes dyspnea on lying flat that resolved after 5-10 seconds. She notes she has less dyspnea if she lies on pillows. There has been no edema. Her BNP has been persistently elevated in the 1000 range. There were some efforts to augment her diuresis several complicated by increasing BUN and creatinine. She is seen pulmonary which has been unilluminating  She has been under a great deal of psychosocial stress  Past Medical History  Diagnosis Date  . Atrial fibrillation     on Tikosyn and Coumadin  . Hyperlipidemia   . Hypothyroidism   . Bradycardia   . Cardiomyopathy, hypertrophic nonobstructive     ejection fraction 25%  . Anal fissure   . Adenomatous colon polyp   . Diverticulosis   . AICD (automatic cardioverter/defibrillator) present   . Systolic heart failure   . History of colonoscopy   . Heart murmur   . Migraines   . Hyperlipidemia   . Hypertension     Past Surgical History  Procedure Date  . Cardioversion   . Cardiac defibrillator placement 2007    Current Outpatient Prescriptions  Medication Sig Dispense Refill  . benazepril (LOTENSIN) 10 MG tablet TAKE ONE TABLET BY MOUTH TWICE DAILY  180 tablet  3  . calcipotriene (DOVONOX) 0.005 % cream Apply topically as needed.       . carvedilol (COREG) 12.5 MG tablet Take 1  tablet (12.5 mg total) by mouth 2 (two) times daily with a meal.  180 tablet  3  . clorazepate (TRANXENE) 7.5 MG tablet Take 1 tablet (7.5 mg total) by mouth 2 (two) times daily as needed.  60 tablet  3  . dofetilide (TIKOSYN) 125 MCG capsule Take 1 capsule (125 mcg total) by mouth 2 (two) times daily. Total of 375 bid  180 capsule  3  . dofetilide (TIKOSYN) 250 MCG capsule Take 1 capsule (250 mcg total) by mouth 2 (two) times daily.  180 capsule  3  . furosemide (LASIX) 20 MG tablet Take 1/2 pill in AM and 2 1/2 whole pill in PM      . HYDROcodone-homatropine (HYCODAN) 5-1.5 MG/5ML syrup TAKE ONE TEASPOONFUL BY MOUTH TWICE DAILY AS NEEDED FOR COUGH  180 mL  1  . metoprolol succinate (TOPROL-XL) 50 MG 24 hr tablet Take 1/2 tablet by mouth as needed      . PROCTOSOL HC 2.5 % rectal cream USE AS DIRECTED  29 g  3  . SYNTHROID 100 MCG tablet TAKE ONE TABLET BY MOUTH EVERY DAY  90 each  3  . vitamin E 400 UNIT capsule Take 400 Units by mouth daily.        Marland Kitchen warfarin (COUMADIN) 5 MG tablet AS DIRECTED  90 tablet  3  . ZETIA 10 MG tablet TAKE ONE TABLET BY MOUTH DAILY.  90 tablet  2    Allergies  Allergen Reactions  . Amoxicillin     Rash   . Codeine     REACTION: nausea  . Niacin     REACTION: rash  . Procaine Hcl     REACTION: smothers  . Ramipril     REACTION: unspecified  . Statins     REACTION: muscle aches    Review of Systems negative except from HPI and PMH  Physical Exam BP 139/85  Pulse 65  Ht 5\' 3"  (1.6 m)  Wt 147 lb (66.679 kg)  BMI 26.04 kg/m2 Well developed and well nourished in no acute distress HENT normal E scleral and icterus clear Neck Supple JVP 8-9cm; +HJR carotids brisk and full Clear to ausculation  Regular rate and rhythm, no murmurs gallops or rub Soft with active bowel sounds No clubbing cyanosis Trace and none Edema Alert and oriented, grossly normal motor and sensory function Skin Warm and Dry  ECG demonstrates AV biventricular  pacing  Assessment and  Plan

## 2012-02-15 NOTE — Telephone Encounter (Signed)
Appt scheduled with Dr Graciela Husbands today.

## 2012-02-23 ENCOUNTER — Other Ambulatory Visit (INDEPENDENT_AMBULATORY_CARE_PROVIDER_SITE_OTHER): Payer: Medicare Other

## 2012-02-23 ENCOUNTER — Ambulatory Visit (HOSPITAL_COMMUNITY): Payer: Medicare Other | Attending: Cardiology | Admitting: Radiology

## 2012-02-23 ENCOUNTER — Encounter: Payer: Self-pay | Admitting: Internal Medicine

## 2012-02-23 ENCOUNTER — Ambulatory Visit (INDEPENDENT_AMBULATORY_CARE_PROVIDER_SITE_OTHER): Payer: Medicare Other | Admitting: Family

## 2012-02-23 ENCOUNTER — Ambulatory Visit (INDEPENDENT_AMBULATORY_CARE_PROVIDER_SITE_OTHER): Payer: Medicare Other | Admitting: *Deleted

## 2012-02-23 ENCOUNTER — Other Ambulatory Visit: Payer: Self-pay

## 2012-02-23 DIAGNOSIS — E785 Hyperlipidemia, unspecified: Secondary | ICD-10-CM | POA: Insufficient documentation

## 2012-02-23 DIAGNOSIS — Z87891 Personal history of nicotine dependence: Secondary | ICD-10-CM | POA: Insufficient documentation

## 2012-02-23 DIAGNOSIS — I5022 Chronic systolic (congestive) heart failure: Secondary | ICD-10-CM | POA: Insufficient documentation

## 2012-02-23 DIAGNOSIS — R0609 Other forms of dyspnea: Secondary | ICD-10-CM | POA: Insufficient documentation

## 2012-02-23 DIAGNOSIS — I4891 Unspecified atrial fibrillation: Secondary | ICD-10-CM | POA: Insufficient documentation

## 2012-02-23 DIAGNOSIS — R0989 Other specified symptoms and signs involving the circulatory and respiratory systems: Secondary | ICD-10-CM | POA: Insufficient documentation

## 2012-02-23 DIAGNOSIS — N189 Chronic kidney disease, unspecified: Secondary | ICD-10-CM | POA: Insufficient documentation

## 2012-02-23 DIAGNOSIS — E039 Hypothyroidism, unspecified: Secondary | ICD-10-CM

## 2012-02-23 DIAGNOSIS — Z7901 Long term (current) use of anticoagulants: Secondary | ICD-10-CM

## 2012-02-23 DIAGNOSIS — I4949 Other premature depolarization: Secondary | ICD-10-CM | POA: Insufficient documentation

## 2012-02-23 LAB — ICD DEVICE OBSERVATION
AL THRESHOLD: 1.25 V
ATRIAL PACING ICD: 98 pct
BAMS-0003: 70 {beats}/min
DEV-0020ICD: NEGATIVE
HV IMPEDENCE: 41 Ohm
LV LEAD IMPEDENCE ICD: 490 Ohm
TZAT-0001FASTVT: 1
TZAT-0004FASTVT: 8
TZAT-0004SLOWVT: 8
TZAT-0012FASTVT: 200 ms
TZAT-0012SLOWVT: 200 ms
TZAT-0013FASTVT: 2
TZAT-0018FASTVT: NEGATIVE
TZAT-0018SLOWVT: NEGATIVE
TZAT-0019FASTVT: 7.5 V
TZAT-0019SLOWVT: 7.5 V
TZAT-0020FASTVT: 1 ms
TZON-0003FASTVT: 300 ms
TZON-0003SLOWVT: 350 ms
TZON-0004FASTVT: 30
TZON-0004SLOWVT: 40
TZON-0005FASTVT: 6
TZON-0010SLOWVT: 40 ms
TZST-0001FASTVT: 3
TZST-0001FASTVT: 4
TZST-0001SLOWVT: 3
TZST-0001SLOWVT: 5
TZST-0003FASTVT: 36 J
TZST-0003FASTVT: 40 J
TZST-0003SLOWVT: 30 J
TZST-0003SLOWVT: 40 J
VENTRICULAR PACING ICD: 99 pct

## 2012-02-23 LAB — BASIC METABOLIC PANEL
BUN: 28 mg/dL — ABNORMAL HIGH (ref 6–23)
Chloride: 106 mEq/L (ref 96–112)
Creatinine, Ser: 1.5 mg/dL — ABNORMAL HIGH (ref 0.4–1.2)
Glucose, Bld: 89 mg/dL (ref 70–99)
Potassium: 3.9 mEq/L (ref 3.5–5.1)

## 2012-02-23 NOTE — Progress Notes (Signed)
Limited Echocardiogram Performed with AV Optimization.

## 2012-02-23 NOTE — Patient Instructions (Addendum)
Continue Same dose 1 tablet Sun, Wed and Sat then 0.5 tablet Mon, Tues, Thur, Fri.  Recheck in 4 weeks.  Anticoagulation Dose Instructions as of 02/23/2012     Sandra [name] Tue Wed Thu Fri Sat   New Dose 5 mg 2.5 mg 2.5 mg 5 mg 2.5 mg 2.5 mg 5 mg    Description       Continue Same dose 1 tablet Sun, Wed and Sat then 0.5 tablet Mon, Tues, Thur, Fri.  Recheck in 4 weeks.

## 2012-02-23 NOTE — Progress Notes (Signed)
Av opt in clinic

## 2012-02-24 ENCOUNTER — Encounter: Payer: Medicare Other | Admitting: Family

## 2012-03-08 ENCOUNTER — Encounter: Payer: Self-pay | Admitting: Internal Medicine

## 2012-03-08 ENCOUNTER — Ambulatory Visit (INDEPENDENT_AMBULATORY_CARE_PROVIDER_SITE_OTHER): Payer: Medicare Other | Admitting: Internal Medicine

## 2012-03-08 VITALS — BP 130/80 | HR 72 | Temp 98.2°F | Resp 16 | Ht 63.0 in | Wt 145.0 lb

## 2012-03-08 DIAGNOSIS — I10 Essential (primary) hypertension: Secondary | ICD-10-CM

## 2012-03-08 DIAGNOSIS — E785 Hyperlipidemia, unspecified: Secondary | ICD-10-CM

## 2012-03-08 DIAGNOSIS — I4891 Unspecified atrial fibrillation: Secondary | ICD-10-CM

## 2012-03-08 NOTE — Progress Notes (Signed)
  Subjective:    Patient ID: Sandra [name], female    DOB: 1932/02/20, 79 y.o.   MRN: [id number]  HPI reviewed diet and food Af stable Hypothyroidism And follw up for   Review of Systems  Constitutional: Negative for activity change, appetite change and fatigue.  HENT: Negative for ear pain, congestion, neck pain, postnasal drip and sinus pressure.   Eyes: Negative for redness and visual disturbance.  Respiratory: Negative for cough, shortness of breath and wheezing.   Gastrointestinal: Negative for abdominal pain and abdominal distention.  Genitourinary: Negative for dysuria, frequency and menstrual problem.  Musculoskeletal: Negative for myalgias, joint swelling and arthralgias.  Skin: Negative for rash and wound.  Neurological: Negative for dizziness, weakness and headaches.  Hematological: Negative for adenopathy. Does not bruise/bleed easily.  Psychiatric/Behavioral: Negative for sleep disturbance and decreased concentration.       Objective:   Physical Exam  Nursing note and vitals reviewed. Constitutional: She is oriented to person, place, and time. She appears well-developed and well-nourished. No distress.  HENT:  Head: Normocephalic and atraumatic.  Right Ear: External ear normal.  Left Ear: External ear normal.  Nose: Nose normal.  Mouth/Throat: Oropharynx is clear and moist.  Eyes: Conjunctivae and EOM are normal. Pupils are equal, round, and reactive to light.  Neck: Normal range of motion. Neck supple. No JVD present. No tracheal deviation present. No thyromegaly present.  Cardiovascular: Normal rate and regular rhythm.   Murmur heard. Pulmonary/Chest: Effort normal and breath sounds normal. She has no wheezes. She exhibits no tenderness.  Abdominal: Soft. Bowel sounds are normal.  Musculoskeletal: She exhibits no edema and no tenderness.  Lymphadenopathy:    She has no cervical adenopathy.  Neurological: She is alert and oriented to person, place, and  time. She has normal reflexes. No cranial nerve deficit.  Skin: Skin is warm and dry. Rash noted. She is not diaphoretic. There is pallor.  Psychiatric: She has a normal mood and affect. Her behavior is normal.          Assessment & Plan:  HTN and AF stable with st jude VR Hypothyroid Monitoring renal function Stable creatinine Stable T3 and t4 Reviewed her HOCM hx and cardiology visit for CHF  Inject trigger finger on right hand

## 2012-03-11 ENCOUNTER — Ambulatory Visit: Payer: Medicare Other | Admitting: Internal Medicine

## 2012-03-16 ENCOUNTER — Other Ambulatory Visit: Payer: Self-pay | Admitting: Internal Medicine

## 2012-03-23 ENCOUNTER — Ambulatory Visit (INDEPENDENT_AMBULATORY_CARE_PROVIDER_SITE_OTHER): Payer: Medicare Other | Admitting: Family

## 2012-03-23 DIAGNOSIS — Z7901 Long term (current) use of anticoagulants: Secondary | ICD-10-CM

## 2012-03-23 DIAGNOSIS — I4891 Unspecified atrial fibrillation: Secondary | ICD-10-CM

## 2012-03-23 NOTE — Patient Instructions (Addendum)
Continue Same dose 1 tablet Sun, Wed and Sat then 0.5 tablet Mon, Tues, Thur, Fri.  Recheck in 6 weeks.  Anticoagulation Dose Instructions as of 03/23/2012     Glynis Smiles Tue Wed Thu Fri Sat   New Dose 5 mg 2.5 mg 2.5 mg 5 mg 2.5 mg 2.5 mg 5 mg    Description       Continue Same dose 1 tablet Sun, Wed and Sat then 0.5 tablet Mon, Tues, Thur, Fri.  Recheck in 6 weeks.

## 2012-03-31 ENCOUNTER — Encounter: Payer: Medicare Other | Admitting: Internal Medicine

## 2012-04-12 ENCOUNTER — Encounter: Payer: Self-pay | Admitting: Internal Medicine

## 2012-04-12 ENCOUNTER — Ambulatory Visit (INDEPENDENT_AMBULATORY_CARE_PROVIDER_SITE_OTHER): Payer: Medicare Other | Admitting: Internal Medicine

## 2012-04-12 VITALS — BP 108/60 | HR 70 | Ht 63.0 in | Wt 145.4 lb

## 2012-04-12 DIAGNOSIS — I421 Obstructive hypertrophic cardiomyopathy: Secondary | ICD-10-CM

## 2012-04-12 DIAGNOSIS — I5022 Chronic systolic (congestive) heart failure: Secondary | ICD-10-CM

## 2012-04-12 DIAGNOSIS — I4891 Unspecified atrial fibrillation: Secondary | ICD-10-CM

## 2012-04-12 DIAGNOSIS — Z95810 Presence of automatic (implantable) cardiac defibrillator: Secondary | ICD-10-CM

## 2012-04-12 LAB — BASIC METABOLIC PANEL
BUN: 26 mg/dL — ABNORMAL HIGH (ref 6–23)
CO2: 28 mEq/L (ref 19–32)
Chloride: 106 mEq/L (ref 96–112)
GFR: 34.61 mL/min — ABNORMAL LOW (ref >60.00)
Glucose, Bld: 86 mg/dL (ref 70–99)
Potassium: 4.5 mEq/L (ref 3.5–5.1)
Sodium: 142 mEq/L (ref 135–145)

## 2012-04-12 LAB — ICD DEVICE OBSERVATION
AL THRESHOLD: 1 V
ATRIAL PACING ICD: 91 pct
BAMS-0001: 170 {beats}/min
BAMS-0003: 70 {beats}/min
CHARGE TIME: 9.3 s
DEV-0020ICD: NEGATIVE
LV LEAD THRESHOLD: 0.75 V
PACEART VT: 0
RV LEAD IMPEDENCE ICD: 362.5 Ohm
RV LEAD THRESHOLD: 1 V
TOT-0006: 20130311000000
TOT-0009: 0
TZAT-0001FASTVT: 1
TZAT-0001SLOWVT: 1
TZAT-0004FASTVT: 8
TZAT-0004SLOWVT: 8
TZAT-0012FASTVT: 200 ms
TZAT-0019SLOWVT: 7.5 V
TZON-0004FASTVT: 30
TZON-0010SLOWVT: 40 ms
TZST-0001FASTVT: 3
TZST-0001FASTVT: 5
TZST-0001SLOWVT: 2
TZST-0001SLOWVT: 3
TZST-0003FASTVT: 30 J
TZST-0003FASTVT: 40 J
TZST-0003SLOWVT: 30 J
TZST-0003SLOWVT: 40 J

## 2012-04-12 MED ORDER — FUROSEMIDE 40 MG PO TABS: ORAL_TABLET | ORAL | Status: DC

## 2012-04-12 MED ORDER — METOPROLOL SUCCINATE ER 50 MG PO TB24: ORAL_TABLET | ORAL | Status: DC

## 2012-04-12 MED ORDER — MAGNESIUM OXIDE 400 MG PO TABS: 400.0000 mg | ORAL_TABLET | Freq: Every day | ORAL | Status: DC

## 2012-04-12 NOTE — Assessment & Plan Note (Signed)
As above.

## 2012-04-12 NOTE — Assessment & Plan Note (Signed)
She is improved with increasing her diuretics. She notes also that her blood pressure was low in the morning. Will make the following adjustments in her medications. 1-increase her Lasix from 20/40-40/40. 2-decrease her lisinopril from 10/10-10 every morning.  We'll check a metabolic profile. We'll also have her begin magnesium supplementation with MAG  oxide

## 2012-04-12 NOTE — Patient Instructions (Signed)
Your physician recommends that you have lab work today: bmp  Your physician has recommended you make the following change in your medication:  1) decrease benazepril to 10 mg one tablet by mouth every morning 2) increase lasix (furosemide) to 40 mg twice daily 3) start magesium oxide 400 mg one tablet by mouth daily.  Your physician recommends that you schedule a follow-up appointment in: 3 months with Dr. Graciela Husbands.

## 2012-04-12 NOTE — Progress Notes (Signed)
Patient Care Team: [name], MD as PCP - General Currie Paris, MD (General Surgery)   HPI  Sandra [name] is a 79 y.o. female seen in followup for congestive heart failure in the setting of hypertrophic cardiomyopathy with depressed left ventricular function. She is status post CRT-D implantation.   She is on Coumadin. Because of recurrences of atrial fibrillation she is also a put on dofetilide in February.   She underwent device generator replacement 3/13 and for the next 6 months did very well.  She is having some problems with shortness of breath which she has improved with adjusting her Lasix by herself. She continues to have significant fatigue.   Past Medical History  Diagnosis Date  . Atrial fibrillation     on Tikosyn and Coumadin  . Hyperlipidemia   . Hypothyroidism   . Bradycardia   . Cardiomyopathy, hypertrophic nonobstructive     ejection fraction 25%  . Anal fissure   . Adenomatous colon polyp   . Diverticulosis   . ICD-CRT     generator change 2013  . Systolic heart failure   . History of colonoscopy   . Heart murmur   . Migraines   . Hyperlipidemia   . Hypertension     Past Surgical History  Procedure Laterality Date  . Cardioversion    . Cardiac defibrillator placement  2007    Current Outpatient Prescriptions  Medication Sig Dispense Refill  . benazepril (LOTENSIN) 10 MG tablet TAKE ONE TABLET BY MOUTH TWICE DAILY  180 tablet  0  . calcipotriene (DOVONOX) 0.005 % cream Apply topically as needed.       . carvedilol (COREG) 12.5 MG tablet Take 1 tablet (12.5 mg total) by mouth 2 (two) times daily with a meal.  180 tablet  3  . clorazepate (TRANXENE) 7.5 MG tablet Take 1 tablet (7.5 mg total) by mouth 2 (two) times daily as needed.  60 tablet  3  . dofetilide (TIKOSYN) 125 MCG capsule Take 1 capsule (125 mcg total) by mouth 2 (two) times daily. Total of 375 bid  180 capsule  3  . furosemide (LASIX) 20 MG tablet Take 1 in am and 2 in pm       . HYDROcodone-homatropine (HYCODAN) 5-1.5 MG/5ML syrup TAKE ONE TEASPOONFUL BY MOUTH TWICE DAILY AS NEEDED FOR COUGH  180 mL  1  . metoprolol succinate (TOPROL-XL) 50 MG 24 hr tablet Take 1/2 tablet by mouth as needed      . PROCTOSOL HC 2.5 % rectal cream USE AS DIRECTED  29 g  3  . SYNTHROID 100 MCG tablet TAKE ONE TABLET BY MOUTH EVERY DAY  90 each  3  . vitamin E 400 UNIT capsule Take 400 Units by mouth daily.        Marland Kitchen warfarin (COUMADIN) 5 MG tablet AS DIRECTED  90 tablet  3  . ZETIA 10 MG tablet TAKE ONE TABLET BY MOUTH DAILY.  90 tablet  2   No current facility-administered medications for this visit.    Allergies  Allergen Reactions  . Amoxicillin     Rash   . Codeine     REACTION: nausea  . Niacin     REACTION: rash  . Procaine Hcl     REACTION: smothers  . Ramipril     REACTION: unspecified  . Statins     REACTION: muscle aches    Review of Systems negative except from HPI and PMH  Physical Exam BP  108/60  Pulse 70  Ht 5\' 3"  (1.6 m)  Wt 145 lb 6.4 oz (65.953 kg)  BMI 25.76 kg/m2  SpO2 98% Well developed and well nourished in no acute distress HENT normal E scleral and icterus clear Neck Supple JVP 6-7 carotids brisk and full Clear to ausculation  Regular rate and rhythm, 2/6 murmurs  Soft with active bowel sounds No clubbing cyanosis none Edema Alert and oriented, grossly normal motor and sensory function Skin Warm and Dry    Assessment and  Plan

## 2012-05-04 ENCOUNTER — Ambulatory Visit (INDEPENDENT_AMBULATORY_CARE_PROVIDER_SITE_OTHER): Payer: Medicare Other | Admitting: Family

## 2012-05-04 DIAGNOSIS — I4891 Unspecified atrial fibrillation: Secondary | ICD-10-CM

## 2012-05-04 NOTE — Patient Instructions (Addendum)
Continue Same dose 1 tablet Sun, Wed and Sat then 0.5 tablet Mon, Tues, Thur, Fri.  Recheck in 6 weeks.  Anticoagulation Dose Instructions as of 05/04/2012     Glynis Smiles Tue Wed Thu Fri Sat   New Dose 5 mg 2.5 mg 2.5 mg 5 mg 2.5 mg 2.5 mg 5 mg    Description       Continue Same dose 1 tablet Sun, Wed and Sat then 0.5 tablet Mon, Tues, Thur, Fri.  Recheck in 6 weeks.

## 2012-05-09 ENCOUNTER — Telehealth: Payer: Self-pay | Admitting: Internal Medicine

## 2012-05-09 NOTE — Telephone Encounter (Signed)
New Problem:    Patient called in wanting to speak with you about changing her medication regimen back to the way she previously had it because she did better on her old medications.  Please call back.

## 2012-05-09 NOTE — Telephone Encounter (Signed)
I spoke with the patient. She states that she thinks she may need to go back to her previous dose of medications. She has been checking her weight, which is stable. She is still complaining of SOB. She is concerned that her DBP is going up in the 80's and her HR is in the low to upper 70's when this happens. At her office visit with Dr. Graciela Husbands on 4/1, he increased her lasix from 20/40 to 40/40 and decreased her benazepril to 10 mg daily. I advised the patient that she could go back to her previous dose of medications. She will continue to track her BP/HR/ & weight. She will let me know in 1 week if her symptoms have not improved. I explained I am not certain what her continued SOB is related to. I advised this may be her baseline now, but we will adjust her medications and if things are worse or not improving, she will let me know. I will forward to Dr. Graciela Husbands as an Lorain Childes.

## 2012-05-10 ENCOUNTER — Other Ambulatory Visit: Payer: Self-pay | Admitting: Internal Medicine

## 2012-05-16 ENCOUNTER — Other Ambulatory Visit: Payer: Self-pay | Admitting: Internal Medicine

## 2012-05-16 ENCOUNTER — Encounter: Payer: Self-pay | Admitting: Family

## 2012-05-16 ENCOUNTER — Ambulatory Visit (INDEPENDENT_AMBULATORY_CARE_PROVIDER_SITE_OTHER): Payer: Medicare Other | Admitting: Family

## 2012-05-16 ENCOUNTER — Telehealth: Payer: Self-pay | Admitting: Internal Medicine

## 2012-05-16 VITALS — BP 120/74 | HR 89 | Wt 148.0 lb

## 2012-05-16 DIAGNOSIS — R51 Headache: Secondary | ICD-10-CM

## 2012-05-16 DIAGNOSIS — I10 Essential (primary) hypertension: Secondary | ICD-10-CM

## 2012-05-16 MED ORDER — LOSARTAN POTASSIUM 50 MG PO TABS: 50.0000 mg | ORAL_TABLET | Freq: Every day | ORAL | Status: DC

## 2012-05-16 NOTE — Telephone Encounter (Signed)
Patient Information:  [name] Name: Avaley  Phone: [phone]  Patient: Sandra [name], Sandra [name]  Gender: Female  DOB: 1932-03-15  Age: 79 Years  PCP: Darryll Capers (Adults only)  Office Follow Up:  Does the office need to follow up with this patient?: No  Instructions For The Office: N/A  RN Note:  Reports daily headache and 2 migraines since 03/15/12. Headaches were worse since 05/12/12. Headache feels dull now. Headache present during night.  Reports relief with Tylenol. Concerned about headache as medication side effect. Headache unchanged since meds adjusted by cardiologist, Dr Clide Cliff 05/09/12.  Symptoms  Reason For Call & Symptoms: Daily headaches; concerned Benazepril dose was changed due to pill was a different color, label said Benazepril hydrochloride and cost less than ususal generic.  Reviewed Health History In EMR: Yes  Reviewed Medications In EMR: Yes  Reviewed Allergies In EMR: Yes  Reviewed Surgeries / Procedures: Yes  Date of Onset of Symptoms: 03/15/2012  Treatments Tried: Cardiologist reduced dose to 1 daily, Tylenol  Treatments Tried Worked: No  Guideline(s) Used:  Headache  Disposition Per Guideline:   See Today or Tomorrow in Office  Reason For Disposition Reached:   Unexplained headache that is present > 24 hours  Advice Given:  Reassurance - Migraine Headache:  You have told me that this headache is similar to previous migraine headaches that you have had. If the pattern or severity of your headache changes, you will need to see your physician.  Migraine headaches are also called vascular headaches. A migraine can be anywhere from mild to severely painful. Sufferers often describe it as throbbing or pulsing. It is often just on one side. Associated symptoms include nausea and vomiting. Some individuals will have visual or other neurological warning symptoms (aura) that a migraine is coming.  Rest:   Lie down in a dark, quiet place and try to relax. Close your eyes  and imagine your entire body relaxing.  Apply Cold to the Area:   Apply a cold wet washcloth or cold pack to the forehead for 20 minutes.  Call Back If:  Headache lasts longer than 24 hours  You become worse.  Patient Will Follow Care Advice:  YES  Appointment Scheduled:  05/16/2012 15:30:00 Appointment Scheduled Provider:  Adline [facility])

## 2012-05-17 ENCOUNTER — Encounter: Payer: Self-pay | Admitting: Family

## 2012-05-17 NOTE — Progress Notes (Signed)
Subjective:    Patient ID: Sandra [name], female    DOB: 06/29/1932, 79 y.o.   MRN: [id number]  HPI 79 year old white female, nonsmoker, patient of Dr. Lovell Sheehan is in today with c/o persistent headaches that occur daily since 03/16/2012. She reports the manufacturer changing and her tablet being white this month. She decreased her Lotensin dosage to 1 tab and the headache improved. She reports the headache being in the front of her forehead, rates it a 6/10 that does not respond to Tylenol. The headaches ae sometimes accompanied by nausea and vomiting. She occasionally take Advil that helps. She is on coumadin and tolerating it well.  She has a dog and a cat.   Review of Systems  Constitutional: Negative.   HENT: Positive for congestion. Negative for sneezing and postnasal drip.   Eyes: Negative.   Respiratory: Negative.   Cardiovascular: Negative.   Musculoskeletal: Negative.   Skin: Negative.   Allergic/Immunologic: Negative.  Negative for environmental allergies and immunocompromised state.       Has a cat and dog at home  Neurological: Positive for headaches. Negative for dizziness and light-headedness.  Psychiatric/Behavioral: Negative.    Past Medical History  Diagnosis Date  . Atrial fibrillation     on Tikosyn and Coumadin  . Hyperlipidemia   . Hypothyroidism   . Bradycardia   . Cardiomyopathy, hypertrophic nonobstructive     ejection fraction 25%  . Anal fissure   . Adenomatous colon polyp   . Diverticulosis   . ICD-CRT     generator change 2013  . Systolic heart failure   . History of colonoscopy   . Heart murmur   . Migraines   . Hyperlipidemia   . Hypertension     History   Social History  . Marital Status: Married    Spouse Name: N/A    Number of Children: 3  . Years of Education: N/A   Occupational History  . Retired     Engineer, petroleum   Social History Main Topics  . Smoking status: Former Smoker -- 0.20 packs/day for 19 years    Types:  Cigarettes    Quit date: 01/13/1987  . Smokeless tobacco: Not on file  . Alcohol Use: No  . Drug Use: No  . Sexually Active: Not Currently   Other Topics Concern  . Not on file   Social History Narrative  . No narrative on file    Past Surgical History  Procedure Laterality Date  . Cardioversion    . Cardiac defibrillator placement  2007    Family History  Problem Relation Age of Onset  . Heart disease Mother   . Cancer Father     colon  . COPD Father   . Heart disease Father   . Cardiomyopathy Daughter   . Cancer Paternal Aunt     BREAST    Allergies  Allergen Reactions  . Amoxicillin     Rash   . Codeine     REACTION: nausea  . Niacin     REACTION: rash  . Procaine Hcl     REACTION: smothers  . Ramipril     REACTION: unspecified  . Statins     REACTION: muscle aches    Current Outpatient Prescriptions on File Prior to Visit  Medication Sig Dispense Refill  . calcipotriene (DOVONOX) 0.005 % cream Apply topically as needed.       . carvedilol (COREG) 12.5 MG tablet TAKE ONE TABLET BY MOUTH TWICE DAILY  WITH A MEAL  180 tablet  1  . clorazepate (TRANXENE) 7.5 MG tablet Take 1 tablet (7.5 mg total) by mouth 2 (two) times daily as needed.  60 tablet  3  . dofetilide (TIKOSYN) 125 MCG capsule Take 1 capsule (125 mcg total) by mouth 2 (two) times daily. Total of 375 bid  180 capsule  3  . furosemide (LASIX) 40 MG tablet 40 mg. Take 1/2 tablet every morning and one tablet every afternoon.      Marland Kitchen HYDROcodone-homatropine (HYCODAN) 5-1.5 MG/5ML syrup TAKE ONE TEASPOONFUL BY MOUTH TWICE DAILY AS NEEDED FOR COUGH  180 mL  1  . magnesium oxide (MAG-OX 400) 400 MG tablet Take 1 tablet (400 mg total) by mouth daily.  90 tablet  3  . metoprolol succinate (TOPROL-XL) 50 MG 24 hr tablet Take 1/2 tablet by mouth as needed  90 tablet  1  . PROCTOSOL HC 2.5 % rectal cream USE AS DIRECTED  29 g  3  . SYNTHROID 100 MCG tablet TAKE ONE TABLET BY MOUTH EVERY DAY  90 each  3  .  vitamin E 400 UNIT capsule Take 400 Units by mouth daily.        Marland Kitchen warfarin (COUMADIN) 5 MG tablet AS DIRECTED  90 tablet  0  . ZETIA 10 MG tablet TAKE ONE TABLET BY MOUTH DAILY.  90 tablet  2   No current facility-administered medications on file prior to visit.    BP 120/74  Pulse 89  Wt 148 lb (67.132 kg)  BMI 26.22 kg/m2  SpO2 97%chart    Objective:   Physical Exam  Constitutional: She is oriented to person, place, and time. She appears well-developed and well-nourished.  HENT:  Right Ear: External ear normal.  Left Ear: External ear normal.  Nose: Nose normal.  Mouth/Throat: Oropharynx is clear and moist.  Neck: Normal range of motion. Neck supple.  Cardiovascular: Normal rate, regular rhythm and normal heart sounds.   Pulmonary/Chest: Effort normal and breath sounds normal.  Neurological: She is alert and oriented to person, place, and time. She has normal reflexes. No cranial nerve deficit.  Skin: Skin is warm and dry.  Psychiatric: She has a normal mood and affect.          Assessment & Plan:  Assessment:  1. Headache 2. Hypertension  Plan: Change Lotensin to Cozaar 50mg  once daily. Recheck in 2 weeks. Advil only sparingly bc of the interaction with Coumadin. I believe that her headache could be related to sinus inflammation. She has a long haired dog.

## 2012-06-15 ENCOUNTER — Ambulatory Visit (INDEPENDENT_AMBULATORY_CARE_PROVIDER_SITE_OTHER): Payer: Medicare Other | Admitting: Family

## 2012-06-15 DIAGNOSIS — I4891 Unspecified atrial fibrillation: Secondary | ICD-10-CM

## 2012-06-15 NOTE — Patient Instructions (Addendum)
Continue Same dose 1 tablet Sun, Wed and Sat then 0.5 tablet Mon, Tues, Thur, Fri.  Recheck in 6 weeks.  Anticoagulation Dose Instructions as of 06/15/2012     Glynis Smiles Tue Wed Thu Fri Sat   New Dose 5 mg 2.5 mg 2.5 mg 5 mg 2.5 mg 2.5 mg 5 mg    Description       Continue Same dose 1 tablet Sun, Wed and Sat then 0.5 tablet Mon, Tues, Thur, Fri.  Recheck in 6 weeks.

## 2012-06-23 ENCOUNTER — Telehealth: Payer: Self-pay | Admitting: Internal Medicine

## 2012-06-23 NOTE — Telephone Encounter (Signed)
Patient Information:  [name] Name: Samari  Phone: [phone]  Patient: Sandra [name], Sandra [name]  Gender: Female  DOB: 08-Apr-1932  Age: 79 Years  PCP: Darryll Capers (Adults only)  Office Follow Up:  Does the office need to follow up with this patient?: No  Instructions For The Office: N/A  RN Note:  Patient reports she is currently out of town at [place]. Offered to call in A/B otic ear drops per standing orders for pain. Also advised patient that she would need to go [place] in [place] to check for any infection. Patient refuses A/B otic ear drops at this time. Reports she will go to an UC if it gets bad enough. Again offered to call in ear drops, but patient refuses.  Symptoms  Reason For Call & Symptoms: Reports pain to left ear. Pain worsens when swallowing or moving head.  Reviewed Health History In EMR: Yes  Reviewed Medications In EMR: Yes  Reviewed Allergies In EMR: Yes  Reviewed Surgeries / Procedures: Yes  Date of Onset of Symptoms: 06/21/2012  Treatments Tried: Tylenol  Treatments Tried Worked: Yes  Guideline(s) Used:  Earache  Disposition Per Guideline:   See Today in Office  Reason For Disposition Reached:   All other earaches (Exceptions: earache lasting < 1 hour, and earache from air travel)  Advice Given:  Call Back If  You become worse.  Patient Will Follow Care Advice:  YES

## 2012-06-27 ENCOUNTER — Telehealth: Payer: Self-pay | Admitting: Internal Medicine

## 2012-06-27 ENCOUNTER — Ambulatory Visit (INDEPENDENT_AMBULATORY_CARE_PROVIDER_SITE_OTHER): Payer: Medicare Other | Admitting: Family Medicine

## 2012-06-27 ENCOUNTER — Encounter: Payer: Self-pay | Admitting: Family Medicine

## 2012-06-27 VITALS — BP 94/60 | Temp 98.1°F | Wt 148.0 lb

## 2012-06-27 DIAGNOSIS — B029 Zoster without complications: Secondary | ICD-10-CM

## 2012-06-27 DIAGNOSIS — H5712 Ocular pain, left eye: Secondary | ICD-10-CM

## 2012-06-27 DIAGNOSIS — H5710 Ocular pain, unspecified eye: Secondary | ICD-10-CM

## 2012-06-27 MED ORDER — VALACYCLOVIR HCL 1 G PO TABS: 1000.0000 mg | ORAL_TABLET | Freq: Three times a day (TID) | ORAL | Status: DC

## 2012-06-27 NOTE — Telephone Encounter (Signed)
Patient Information:  [name] Name: Zoelle  Phone: [phone]  Patient: Sandra [name], Sandra [name]  Gender: Female  DOB: February 10, 1932  Age: 79 Years  PCP: Darryll Capers (Adults only)  Office Follow Up:  Does the office need to follow up with this patient?: No  Instructions For The Office: N/A   Symptoms  Reason For Call & Symptoms: Onset 06/21/12 left ear pain, stabbing pain.  06/23/12  ENT called in Ciprodex opic ear drops.  Ear feels better.  06/26/12 bumped head on cabinet, small knot on forehead above left eye, that evening a patch of blisters beside left eye came up, approximately 1 inch patch, reddened, pt popped a couple of blisters with clear drainage, c/o eye pain.  06/27/12 afebrile.  Reviewed Health History In EMR: Yes  Reviewed Medications In EMR: Yes  Reviewed Allergies In EMR: Yes  Reviewed Surgeries / Procedures: Yes  Date of Onset of Symptoms: 06/23/2012  Treatments Tried: hydrocortisone cream  Treatments Tried Worked: No  Guideline(s) Used:  Rash or Redness - Localized  Disposition Per Guideline:   See Today in Office  Reason For Disposition Reached:   Painful rash and has multiple small blisters grouped together in one area of body (i.e., dermatomal distribution or "band" or "stripe")  Advice Given:  Apply Cold to the Area:  Apply or soak in cold water for 20 minutes every 3 to 4 hours to reduce itching or pain.  Call Back If:  You become worse.  Patient Will Follow Care Advice:  YES  Appointment Scheduled:  06/27/2012 13:30:00 Appointment Scheduled Provider:  Kriste Basque [facility])

## 2012-06-27 NOTE — Progress Notes (Signed)
Chief Complaint  Patient presents with  . Herpes Zoster    around eye  . hit head yesterday    HPI:  Acute visit for eye disorder: -started last night -symptoms: vesicular painful rash near L eye, pain in L eye - feels like sand in eye and pain when moves eyeball -denies: fevers, HA, malaise, vision disturbance, confusion  ROS: See pertinent positives and negatives per HPI.  Past Medical History  Diagnosis Date  . Atrial fibrillation     on Tikosyn and Coumadin  . Hyperlipidemia   . Hypothyroidism   . Bradycardia   . Cardiomyopathy, hypertrophic nonobstructive     ejection fraction 25%  . Anal fissure   . Adenomatous colon polyp   . Diverticulosis   . ICD-CRT     generator change 2013  . Systolic heart failure   . History of colonoscopy   . Heart murmur   . Migraines   . Hyperlipidemia   . Hypertension     Family History  Problem Relation Age of Onset  . Heart disease Mother   . Cancer Father     colon  . COPD Father   . Heart disease Father   . Cardiomyopathy Daughter   . Cancer Paternal Aunt     BREAST    History   Social History  . Marital Status: Married    Spouse Name: N/A    Number of Children: 3  . Years of Education: N/A   Occupational History  . Retired     Engineer, petroleum   Social History Main Topics  . Smoking status: Former Smoker -- 0.20 packs/day for 19 years    Types: Cigarettes    Quit date: 01/13/1987  . Smokeless tobacco: None  . Alcohol Use: No  . Drug Use: No  . Sexually Active: Not Currently   Other Topics Concern  . None   Social History Narrative  . None    Current outpatient prescriptions:benazepril (LOTENSIN) 10 MG tablet, TAKE ONE TABLET BY MOUTH TWICE DAILY, Disp: 180 tablet, Rfl: 0;  calcipotriene (DOVONOX) 0.005 % cream, Apply topically as needed. , Disp: , Rfl: ;  carvedilol (COREG) 12.5 MG tablet, TAKE ONE TABLET BY MOUTH TWICE DAILY WITH A MEAL, Disp: 180 tablet, Rfl: 1 clorazepate (TRANXENE) 7.5 MG tablet,  Take 1 tablet (7.5 mg total) by mouth 2 (two) times daily as needed., Disp: 60 tablet, Rfl: 3;  dofetilide (TIKOSYN) 125 MCG capsule, Take 1 capsule (125 mcg total) by mouth 2 (two) times daily. Total of 375 bid, Disp: 180 capsule, Rfl: 3;  furosemide (LASIX) 40 MG tablet, 40 mg. Take 1/2 tablet every morning and one tablet every afternoon., Disp: , Rfl:  HYDROcodone-homatropine (HYCODAN) 5-1.5 MG/5ML syrup, TAKE ONE TEASPOONFUL BY MOUTH TWICE DAILY AS NEEDED FOR COUGH, Disp: 180 mL, Rfl: 1;  losartan (COZAAR) 50 MG tablet, Take 1 tablet (50 mg total) by mouth daily., Disp: 30 tablet, Rfl: 0;  magnesium oxide (MAG-OX 400) 400 MG tablet, Take 1 tablet (400 mg total) by mouth daily., Disp: 90 tablet, Rfl: 3 metoprolol succinate (TOPROL-XL) 50 MG 24 hr tablet, Take 1/2 tablet by mouth as needed, Disp: 90 tablet, Rfl: 1;  PROCTOSOL HC 2.5 % rectal cream, USE AS DIRECTED, Disp: 29 g, Rfl: 3;  SYNTHROID 100 MCG tablet, TAKE ONE TABLET BY MOUTH EVERY DAY, Disp: 90 each, Rfl: 3;  vitamin E 400 UNIT capsule, Take 400 Units by mouth daily.  , Disp: , Rfl: ;  warfarin (COUMADIN) 5  MG tablet, AS DIRECTED, Disp: 90 tablet, Rfl: 0 ZETIA 10 MG tablet, TAKE ONE TABLET BY MOUTH DAILY., Disp: 90 tablet, Rfl: 2;  valACYclovir (VALTREX) 1000 MG tablet, Take 1 tablet (1,000 mg total) by mouth 3 (three) times daily., Disp: 21 tablet, Rfl: 0  EXAM:  Filed Vitals:   06/27/12 1323  BP: 94/60  Temp: 98.1 F (36.7 C)    Body mass index is 26.22 kg/(m^2).  GENERAL: vitals reviewed and listed above, alert, oriented, appears well hydrated and in no acute distress  HEENT: atraumatic, L pupil smaller then R, mild erythema of conjunctiva with ? blister cornea, visual acuity and EOM intact, no obvious abnormalities on inspection of external nose and ears  SKIN: small patch of vesicular lesion new L eye  NECK: no obvious masses on inspection  LUNGS: clear to auscultation bilaterally, no wheezes, rales or rhonchi, good air  movement  CV: HRRR, no peripheral edema  MS: moves all extremities without noticeable abnormality  PSYCH: pleasant and cooperative, no obvious depression or anxiety  ASSESSMENT AND PLAN:  Discussed the following assessment and plan:  Shingles - Plan: valACYclovir (VALTREX) 1000 MG tablet  Eye pain, left  -appearance of shingles near l eye with ocular symptoms and pain, valtrex - risks discussed, urgent referral to optho for further evaluation and managment given eye pain, findings on exam and 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 Pt will go straight there from here. Return precautions. Has follow up with PCP next week. -Patient advised to return or notify a doctor immediately if symptoms worsen or persist or new concerns arise.  There are no Patient Instructions on file for this visit.   Kriste Basque R.

## 2012-07-08 ENCOUNTER — Encounter: Payer: Self-pay | Admitting: Internal Medicine

## 2012-07-08 ENCOUNTER — Ambulatory Visit (INDEPENDENT_AMBULATORY_CARE_PROVIDER_SITE_OTHER): Payer: Medicare Other | Admitting: Internal Medicine

## 2012-07-08 VITALS — BP 122/80 | HR 76 | Temp 98.2°F | Resp 16 | Ht 63.0 in | Wt 174.0 lb

## 2012-07-08 DIAGNOSIS — I421 Obstructive hypertrophic cardiomyopathy: Secondary | ICD-10-CM

## 2012-07-08 DIAGNOSIS — B0222 Postherpetic trigeminal neuralgia: Secondary | ICD-10-CM

## 2012-07-08 DIAGNOSIS — I4891 Unspecified atrial fibrillation: Secondary | ICD-10-CM

## 2012-07-08 DIAGNOSIS — N182 Chronic kidney disease, stage 2 (mild): Secondary | ICD-10-CM

## 2012-07-08 DIAGNOSIS — E785 Hyperlipidemia, unspecified: Secondary | ICD-10-CM

## 2012-07-08 DIAGNOSIS — E039 Hypothyroidism, unspecified: Secondary | ICD-10-CM

## 2012-07-08 MED ORDER — PREGABALIN 75 MG PO CAPS: 75.0000 mg | ORAL_CAPSULE | Freq: Two times a day (BID) | ORAL | Status: DC

## 2012-07-08 NOTE — Progress Notes (Signed)
  Subjective:    Patient ID: Sandra [name], female    DOB: 04/14/1932, 79 y.o.   MRN: [id number]  HPI Has post shingles neuropathy Was diagnosed with Dr Selena Batten Was on valtrex for 7 days Moderate post neuropathy    Review of Systems  Constitutional: Negative for activity change, appetite change and fatigue.  HENT: Positive for neck stiffness. Negative for ear pain, congestion, neck pain, postnasal drip and sinus pressure.   Eyes: Negative for redness and visual disturbance.  Respiratory: Positive for shortness of breath. Negative for cough and wheezing.   Cardiovascular: Positive for palpitations.  Gastrointestinal: Negative for abdominal pain and abdominal distention.  Genitourinary: Negative for dysuria, frequency and menstrual problem.  Musculoskeletal: Negative for myalgias, joint swelling and arthralgias.  Skin: Positive for color change and rash. Negative for wound.       Residual form shingles healing  Neurological: Negative for dizziness, weakness and headaches.  Hematological: Negative for adenopathy. Does not bruise/bleed easily.  Psychiatric/Behavioral: Negative for sleep disturbance and decreased concentration.       Objective:   Physical Exam  Vitals reviewed. Constitutional: She is oriented to person, place, and time. She appears well-developed and well-nourished. No distress.  HENT:  Head: Normocephalic and atraumatic.  shingles  Eyes: Conjunctivae and EOM are normal. Pupils are equal, round, and reactive to light.  Neck: Normal range of motion. Neck supple. No JVD present. No tracheal deviation present. No thyromegaly present.  Cardiovascular:  Murmur heard. irregular  Pulmonary/Chest: Effort normal and breath sounds normal. She has no wheezes. She exhibits no tenderness.  Abdominal: Soft. Bowel sounds are normal.  Musculoskeletal: Normal range of motion. She exhibits no edema and no tenderness.  Lymphadenopathy:    She has no cervical adenopathy.   Neurological: She is alert and oriented to person, place, and time. She has normal reflexes. No cranial nerve deficit.  Skin: Skin is warm and dry. Rash noted. She is not diaphoretic.  Psychiatric: She has a normal mood and affect. Her behavior is normal.          Assessment & Plan:  Lyrica for 30 days for post herpetic neuropathy Rash has crusted over and no new rash HTN Stable hyperlipidemia

## 2012-07-11 ENCOUNTER — Encounter: Payer: Self-pay | Admitting: Internal Medicine

## 2012-07-11 ENCOUNTER — Ambulatory Visit (INDEPENDENT_AMBULATORY_CARE_PROVIDER_SITE_OTHER): Payer: Medicare Other | Admitting: Internal Medicine

## 2012-07-11 VITALS — BP 102/64 | HR 65 | Ht 63.0 in | Wt 145.0 lb

## 2012-07-11 DIAGNOSIS — Z95810 Presence of automatic (implantable) cardiac defibrillator: Secondary | ICD-10-CM

## 2012-07-11 DIAGNOSIS — I5022 Chronic systolic (congestive) heart failure: Secondary | ICD-10-CM

## 2012-07-11 DIAGNOSIS — I4891 Unspecified atrial fibrillation: Secondary | ICD-10-CM

## 2012-07-11 DIAGNOSIS — I421 Obstructive hypertrophic cardiomyopathy: Secondary | ICD-10-CM

## 2012-07-11 LAB — MAGNESIUM: Magnesium: 2.3 mg/dL (ref 1.5–2.5)

## 2012-07-11 LAB — ICD DEVICE OBSERVATION
AL IMPEDENCE ICD: 412.5 Ohm
CHARGE TIME: 9.3 s
HV IMPEDENCE: 43 Ohm
LV LEAD IMPEDENCE ICD: 562.5 Ohm
PACEART VT: 0
RV LEAD IMPEDENCE ICD: 350 Ohm
TOT-0006: 20130311000000
TOT-0007: 1
TOT-0008: 0
TOT-0009: 0
TOT-0010: 5
TZAT-0001SLOWVT: 1
TZAT-0004SLOWVT: 8
TZAT-0012FASTVT: 200 ms
TZAT-0012SLOWVT: 200 ms
TZAT-0013FASTVT: 2
TZAT-0013SLOWVT: 3
TZAT-0018FASTVT: NEGATIVE
TZAT-0020FASTVT: 1 ms
TZAT-0020SLOWVT: 1 ms
TZON-0003FASTVT: 300 ms
TZON-0004FASTVT: 30
TZON-0004SLOWVT: 40
TZON-0005FASTVT: 6
TZON-0005SLOWVT: 6
TZST-0001FASTVT: 2
TZST-0001SLOWVT: 3
TZST-0001SLOWVT: 4
TZST-0001SLOWVT: 5
TZST-0003FASTVT: 36 J
TZST-0003FASTVT: 40 J
TZST-0003SLOWVT: 30 J
TZST-0003SLOWVT: 36 J

## 2012-07-11 NOTE — Patient Instructions (Addendum)
Your physician recommends that you have lab work today: magnesium  Your physician recommends that you schedule a follow-up appointment in: 3 months with the device clinic.  Your physician wants you to follow-up in: 6 months with Dr. Graciela Husbands. You will receive a reminder letter in the mail two months in advance. If you don't receive a letter, please call our office to schedule the follow-up appointment.

## 2012-07-11 NOTE — Assessment & Plan Note (Addendum)
She is euvolemic. Continue currently diuretic dose

## 2012-07-11 NOTE — Assessment & Plan Note (Signed)
For her daughter would like to undergo genetic testing. She was phenotypically negative.

## 2012-07-11 NOTE — Assessment & Plan Note (Signed)
The patient's device was interrogated.  The information was reviewed. No changes were made in the programming.    

## 2012-07-11 NOTE — Progress Notes (Signed)
Patient Care Team: [name], MD as PCP - General Currie Paris, MD (General Surgery)   HPI  Sandra [name] is a 79 y.o. female seen in followup for congestive heart failure in the setting of hypertrophic cardiomyopathy with depressed left ventricular function. She is status post CRT-D implantation.   She is on Coumadin. Because of recurrences of atrial fibrillation she is also a put on dofetilide in February.   From a reading for review she has done quite well. Unfortunately, she developed zoster which has left her with a fair amount of pain around her eye as well as dilatation of her left eye  Past Medical History  Diagnosis Date  . Atrial fibrillation     on Tikosyn and Coumadin  . Hyperlipidemia   . Hypothyroidism   . Bradycardia   . Cardiomyopathy, hypertrophic nonobstructive     ejection fraction 25%  . Anal fissure   . Adenomatous colon polyp   . Diverticulosis   . ICD-CRT     generator change 2013  . Systolic heart failure   . History of colonoscopy   . Heart murmur   . Migraines   . Hyperlipidemia   . Hypertension     Past Surgical History  Procedure Laterality Date  . Cardioversion    . Cardiac defibrillator placement  2007    Current Outpatient Prescriptions  Medication Sig Dispense Refill  . benazepril (LOTENSIN) 10 MG tablet TAKE ONE TABLET BY MOUTH TWICE DAILY  180 tablet  0  . calcipotriene (DOVONOX) 0.005 % cream Apply topically as needed.       . carvedilol (COREG) 12.5 MG tablet TAKE ONE TABLET BY MOUTH TWICE DAILY WITH A MEAL  180 tablet  1  . clorazepate (TRANXENE) 7.5 MG tablet Take 1 tablet (7.5 mg total) by mouth 2 (two) times daily as needed.  60 tablet  3  . dofetilide (TIKOSYN) 125 MCG capsule Take 1 capsule (125 mcg total) by mouth 2 (two) times daily. Total of 375 bid  180 capsule  3  . furosemide (LASIX) 40 MG tablet 40 mg. Take 1/2 tablet every morning and one tablet every afternoon.      Marland Kitchen HYDROcodone-homatropine (HYCODAN)  5-1.5 MG/5ML syrup TAKE ONE TEASPOONFUL BY MOUTH TWICE DAILY AS NEEDED FOR COUGH  180 mL  1  . losartan (COZAAR) 50 MG tablet Take 1 tablet (50 mg total) by mouth daily.  30 tablet  0  . magnesium oxide (MAG-OX 400) 400 MG tablet Take 1 tablet (400 mg total) by mouth daily.  90 tablet  3  . metoprolol succinate (TOPROL-XL) 50 MG 24 hr tablet Take 1/2 tablet by mouth as needed  90 tablet  1  . pregabalin (LYRICA) 75 MG capsule Take 1 capsule (75 mg total) by mouth 2 (two) times daily.      Marland Kitchen PROCTOSOL HC 2.5 % rectal cream USE AS DIRECTED  29 g  3  . SYNTHROID 100 MCG tablet TAKE ONE TABLET BY MOUTH EVERY DAY  90 each  3  . valACYclovir (VALTREX) 1000 MG tablet Take 1 tablet (1,000 mg total) by mouth 3 (three) times daily.  21 tablet  0  . vitamin E 400 UNIT capsule Take 400 Units by mouth daily.        Marland Kitchen warfarin (COUMADIN) 5 MG tablet AS DIRECTED  90 tablet  0  . ZETIA 10 MG tablet TAKE ONE TABLET BY MOUTH DAILY.  90 tablet  2   No current  facility-administered medications for this visit.    Allergies  Allergen Reactions  . Amoxicillin     Rash   . Codeine     REACTION: nausea  . Niacin     REACTION: rash  . Procaine Hcl     REACTION: smothers  . Ramipril     REACTION: unspecified  . Statins     REACTION: muscle aches    Review of Systems negative except from HPI and PMH  Physical Exam BP 102/64  Pulse 65  Ht 5\' 3"  (1.6 m)  Wt 145 lb (65.772 kg)  BMI 25.69 kg/m2 Well developed and well nourished in no acute distress HENT residual zoster around the left thigh E scleral and icterus clear Neck Supple JVP 6-7 carotids brisk and full Clear to ausculation  Regular rate and rhythm, 2/6 murmurs  Soft with active bowel sounds No clubbing cyanosis none Edema Alert and oriented, grossly normal motor and sensory function Skin Warm and Dry    Assessment and  Plan

## 2012-07-11 NOTE — Assessment & Plan Note (Signed)
Continues with paroxysms of atrial fibrillation; is on dofetilide and tolerating. Potassium in April with normal; magnesium has not been checked. QTC is long but so is her QRS

## 2012-07-13 ENCOUNTER — Encounter: Payer: Self-pay | Admitting: Internal Medicine

## 2012-07-20 ENCOUNTER — Ambulatory Visit (INDEPENDENT_AMBULATORY_CARE_PROVIDER_SITE_OTHER): Payer: Medicare Other | Admitting: General Practice

## 2012-07-20 ENCOUNTER — Encounter: Payer: Medicare Other | Admitting: Family

## 2012-07-20 DIAGNOSIS — I4891 Unspecified atrial fibrillation: Secondary | ICD-10-CM

## 2012-07-21 ENCOUNTER — Ambulatory Visit: Payer: Medicare Other

## 2012-07-26 ENCOUNTER — Encounter: Payer: Self-pay | Admitting: Emergency Medicine

## 2012-07-26 ENCOUNTER — Ambulatory Visit (INDEPENDENT_AMBULATORY_CARE_PROVIDER_SITE_OTHER): Payer: Medicare Other | Admitting: Emergency Medicine

## 2012-07-26 ENCOUNTER — Telehealth: Payer: Self-pay | Admitting: Internal Medicine

## 2012-07-26 VITALS — BP 108/62 | HR 65 | Temp 97.3°F | Ht 63.0 in | Wt 147.2 lb

## 2012-07-26 DIAGNOSIS — R0600 Dyspnea, unspecified: Secondary | ICD-10-CM

## 2012-07-26 DIAGNOSIS — R0609 Other forms of dyspnea: Secondary | ICD-10-CM

## 2012-07-26 DIAGNOSIS — R0989 Other specified symptoms and signs involving the circulatory and respiratory systems: Secondary | ICD-10-CM

## 2012-07-26 MED ORDER — HYDROXYZINE HCL 10 MG PO TABS: 10.0000 mg | ORAL_TABLET | Freq: Three times a day (TID) | ORAL | Status: DC | PRN

## 2012-07-26 MED ORDER — VALACYCLOVIR HCL 1 G PO TABS: 1000.0000 mg | ORAL_TABLET | Freq: Three times a day (TID) | ORAL | Status: DC

## 2012-07-26 NOTE — Assessment & Plan Note (Signed)
She is significantly improved after adjustment of her diuretics and cardiac meds by Dr Graciela Husbands. Given her reassuring PFT, I believe that this was driven by her cardiac status. She isn;t sure whether her pacer was changed - I dont see that it was per the notes. I will stop her albuterol since it hasn;t helped, will see her prn.

## 2012-07-26 NOTE — Telephone Encounter (Signed)
Patient Information:  [name] Name: Maelle  Phone: [phone]  Patient: Sandra [name], Sandra [name]  Gender: Female  DOB: [birth year]  Age: 79 Years  PCP: Darryll Capers (Adults only)  Office Follow Up:  Does the office need to follow up with this patient?: Yes  Instructions For The Office: Appt needs/Rx/callback krs/can  RN Note:  Stopped her lyrica on 07/23/12 but itching, redness and headache are present.  Diagnosed with trigeminal neuralgia 07/08/12 after being seen for shingles 06/27/12.  Seen by pulmonary 07/26/12 AM but "as long as I'm in town, if they can call me in something while I'm here."  States the itching is intensely bothersome.  States the right eye area is pink but does not appear infected.  Cortisone 10 is not helpful.  Per localized itching protocol, advised appt in follow up for moderate/severe local itching that interferes with sleeping and daily activities.  No appts available in Epic; info to office for staff/provider review/Rx/appt management.  Patient callback number [phone].  krs/can  Symptoms  Reason For Call & Symptoms: itching with headache  Reviewed Health History In EMR: Yes  Reviewed Medications In EMR: Yes  Reviewed Allergies In EMR: Yes  Reviewed Surgeries / Procedures: Yes  Date of Onset of Symptoms: 07/12/2012  Guideline(s) Used:  Itching - Localized  Disposition Per Guideline:   See Within 3 Days in Office  Reason For Disposition Reached:   MODERATE-SEVERE local itching (i.e., interferes with work, school, activities) and not improved after 24 hours of hydrocortisone cream  Advice Given:  N/A  Patient Will Follow Care Advice:  YES

## 2012-07-26 NOTE — Telephone Encounter (Signed)
q 

## 2012-07-26 NOTE — Telephone Encounter (Signed)
Pt was offerred appt at [place] location and decline. Pt is aware MD out of office today

## 2012-07-26 NOTE — Patient Instructions (Addendum)
Please continue your medications as prescribed by Dr Graciela Husbands and Dr Lovell Sheehan.  Follow with Dr Delton Coombes as needed.

## 2012-07-26 NOTE — Telephone Encounter (Signed)
Talked with pt and she states she slight lesion around eyes that are itching,mostly raised areas- per dr Melvern Sample not use cream or ointment around eyes, give valtrex 1 gm tid for 7 days and may have atarax for the itching

## 2012-07-26 NOTE — Progress Notes (Signed)
  Subjective:    Patient ID: Sandra [name], female    DOB: 01-01-1933, 79 y.o.   MRN: [id number]  HPI 79 yo former smoker, Hx of HTN, A Fib, HOCM with EF 25%. She has had SOB for many years - it has been largely stable. About 2 months ago she began to have more SOB, especially when she lays down for bed. Also hears some UA noise / wheeze at that time. Her lasix has been titrated up with improvement in edema but no real change in SOB. She has never been dx with asthma before. She is able to do housework, gets tired w exertion if she doesn't "pace herself". She isn't really sure that the breathing is any worse than her usual baseline.   ROV 01/14/12 -- follow up for dyspnea, some nocturnal sx including wheeze, significant cardiac disease but also a hx of tobacco.  She had PFT today: Normal airflow but with some curve to F-V loop and some evidence for small airways disease. No bronchodilator response or overt evidence asthma.   ROV 07/26/12 -- hx dyspnea, possible mild AFL vs VCD sx.  She was seen by Dr Graciela Husbands to have AICD pacer evaluated, also had her diuretics increased since last time. Her orthopnea is improved. She hasn't experienced any further dyspnea or UA sx. She believes that Dr Odessa Fleming changes have gotten her to baseline.  She does still have some aspiration sx when she eats quickly or talks while eating. She barely used the SABA, didn't notice that it helped.       Objective:   Physical Exam Filed Vitals:   07/26/12 1002  BP: 108/62  Pulse: 65  Temp: 97.3 F (36.3 C)   Gen: Pleasant, well-nourished, in no distress,  normal affect  ENT: No lesions,  mouth clear,  oropharynx clear, no postnasal drip  Neck: No JVD, no TMG, no carotid bruits  Lungs: No use of accessory muscles, no dullness to percussion, clear without rales or rhonchi  Cardiovascular: RRR, heart sounds normal, no murmur or gallops, no peripheral edema  Musculoskeletal: No deformities, no cyanosis or clubbing  Neuro:  alert, non focal  Skin: Warm, no lesions or rashes  10/01/10 --  Comparison: 10/29/2008  Findings: Lungs are clear. The cardiopericardial silhouette is  enlarged. Left-sided triple lead pacer / AICD noted. The  appearance is stable. There is no evidence for fraying or  discontinuity of the leads. Imaged bony structures of the thorax  are intact.  IMPRESSION:  No acute cardiopulmonary process.  No evidence for lead discontinuity or irregularity/fraying.      Assessment & Plan:  Dyspnea She is significantly improved after adjustment of her diuretics and cardiac meds by Dr Graciela Husbands. Given her reassuring PFT, I believe that this was driven by her cardiac status. She isn;t sure whether her pacer was changed - I dont see that it was per the notes. I will stop her albuterol since it hasn;t helped, will see her prn.

## 2012-08-09 ENCOUNTER — Other Ambulatory Visit: Payer: Self-pay | Admitting: *Deleted

## 2012-08-09 MED ORDER — SYNTHROID 100 MCG PO TABS: ORAL_TABLET | ORAL | Status: DC

## 2012-08-09 MED ORDER — WARFARIN SODIUM 5 MG PO TABS: ORAL_TABLET | ORAL | Status: DC

## 2012-08-09 MED ORDER — BENAZEPRIL HCL 10 MG PO TABS: ORAL_TABLET | ORAL | Status: DC

## 2012-08-31 ENCOUNTER — Ambulatory Visit (INDEPENDENT_AMBULATORY_CARE_PROVIDER_SITE_OTHER): Payer: Medicare Other | Admitting: Family Medicine

## 2012-08-31 DIAGNOSIS — I4891 Unspecified atrial fibrillation: Secondary | ICD-10-CM

## 2012-08-31 LAB — POCT INR: INR: 2.5

## 2012-10-05 ENCOUNTER — Ambulatory Visit (INDEPENDENT_AMBULATORY_CARE_PROVIDER_SITE_OTHER): Payer: Medicare Other | Admitting: *Deleted

## 2012-10-05 DIAGNOSIS — I421 Obstructive hypertrophic cardiomyopathy: Secondary | ICD-10-CM

## 2012-10-05 DIAGNOSIS — I5022 Chronic systolic (congestive) heart failure: Secondary | ICD-10-CM

## 2012-10-05 LAB — ICD DEVICE OBSERVATION
AL THRESHOLD: 1 V
DEV-0020ICD: NEGATIVE
FVT: 0
HV IMPEDENCE: 42 Ohm
PACEART VT: 0
RV LEAD AMPLITUDE: 12 mv
RV LEAD IMPEDENCE ICD: 350 Ohm
TOT-0008: 0
TOT-0009: 0
TOT-0010: 6
TZAT-0001FASTVT: 1
TZAT-0004SLOWVT: 8
TZAT-0012FASTVT: 200 ms
TZAT-0012SLOWVT: 200 ms
TZAT-0013SLOWVT: 3
TZAT-0018FASTVT: NEGATIVE
TZAT-0018SLOWVT: NEGATIVE
TZAT-0019FASTVT: 7.5 V
TZAT-0019SLOWVT: 7.5 V
TZAT-0020FASTVT: 1 ms
TZON-0003FASTVT: 300 ms
TZON-0003SLOWVT: 350 ms
TZON-0004FASTVT: 30
TZON-0004SLOWVT: 40
TZON-0005FASTVT: 6
TZON-0010FASTVT: 40 ms
TZON-0010SLOWVT: 40 ms
TZST-0001FASTVT: 3
TZST-0001FASTVT: 4
TZST-0001SLOWVT: 3
TZST-0001SLOWVT: 5
TZST-0003FASTVT: 30 J
TZST-0003FASTVT: 40 J
TZST-0003SLOWVT: 36 J
TZST-0003SLOWVT: 40 J
VF: 0

## 2012-10-05 NOTE — Progress Notes (Signed)
CRT-D device check in office. Thresholds and sensing consistent with previous device measurements. Lead impedance trends stable over time. 459 mode switch episodes recorded---longest was 1 hour 44 minutes. No ventricular arrhythmia episodes recorded. Patient bi-ventricularly pacing 99% of the time. Device programmed with appropriate safety margins. CorVue increase 07-14-12 x 8 days but stable at this time. No changes made this session. Estimated longevity 5.0 to 5.3 years. ROV in 3 mths w/device clinic.

## 2012-10-12 ENCOUNTER — Ambulatory Visit (INDEPENDENT_AMBULATORY_CARE_PROVIDER_SITE_OTHER): Payer: Medicare Other | Admitting: General Practice

## 2012-10-12 DIAGNOSIS — I4891 Unspecified atrial fibrillation: Secondary | ICD-10-CM

## 2012-10-12 LAB — POCT INR: INR: 3.5

## 2012-10-14 ENCOUNTER — Telehealth: Payer: Self-pay | Admitting: Internal Medicine

## 2012-10-14 NOTE — Telephone Encounter (Signed)
Patient states that she is feeling better today. She states that she is not currently in Afib, but that she thinks she goes in and out. She states that she takes Metoprolol as needed if she feels that she is in AFib. She asked if she should take it tonight and I advised her only to take it if she felt like she was in AFib. I also advised her that if she felt like she was out of rhythm again or her heart rate was too high to call us and we can bring her in for an EKG. Patient is agreeable to plan.

## 2012-10-14 NOTE — Telephone Encounter (Signed)
New Problem:  Pt states she has not been feeling well since Sunday. Pt also states she feels like she is going in and out of atrial fib. Pt would like to be advised.

## 2012-10-15 ENCOUNTER — Encounter: Payer: Self-pay | Admitting: Internal Medicine

## 2012-10-17 ENCOUNTER — Ambulatory Visit (INDEPENDENT_AMBULATORY_CARE_PROVIDER_SITE_OTHER): Payer: Medicare Other | Admitting: Internal Medicine

## 2012-10-17 ENCOUNTER — Encounter: Payer: Self-pay | Admitting: Internal Medicine

## 2012-10-17 VITALS — BP 130/78 | HR 68 | Temp 98.2°F | Resp 16 | Ht 63.0 in | Wt 150.0 lb

## 2012-10-17 DIAGNOSIS — E785 Hyperlipidemia, unspecified: Secondary | ICD-10-CM

## 2012-10-17 DIAGNOSIS — E039 Hypothyroidism, unspecified: Secondary | ICD-10-CM

## 2012-10-17 DIAGNOSIS — N182 Chronic kidney disease, stage 2 (mild): Secondary | ICD-10-CM

## 2012-10-17 DIAGNOSIS — Z23 Encounter for immunization: Secondary | ICD-10-CM

## 2012-10-17 DIAGNOSIS — J441 Chronic obstructive pulmonary disease with (acute) exacerbation: Secondary | ICD-10-CM

## 2012-10-17 NOTE — Progress Notes (Signed)
Subjective:    Patient ID: Sandra [name], female    DOB: 1932-01-23, 79 y.o.   MRN: [id number]  HPI  Has an episode of wheezing with atrial fib and she was SOB But the SOB responded to an inhaler. She did have swelling in legs and this SOB was different? She was sitting up in a chair for SOB Rate? Vs COPD?  History of smoking but had PFT's and passed.    Review of Systems  Constitutional: Negative for activity change, appetite change and fatigue.  HENT: Negative for ear pain, congestion, neck pain, postnasal drip and sinus pressure.   Eyes: Negative for redness and visual disturbance.  Respiratory: Positive for chest tightness, shortness of breath and wheezing. Negative for cough.   Gastrointestinal: Negative for abdominal pain and abdominal distention.  Genitourinary: Negative for dysuria, frequency and menstrual problem.  Musculoskeletal: Negative for myalgias, joint swelling and arthralgias.  Skin: Negative for rash and wound.  Neurological: Negative for dizziness, weakness and headaches.  Hematological: Negative for adenopathy. Does not bruise/bleed easily.  Psychiatric/Behavioral: Negative for sleep disturbance and decreased concentration.   Past Medical History  Diagnosis Date  . Atrial fibrillation     on Tikosyn and Coumadin  . Hyperlipidemia   . Hypothyroidism   . Bradycardia   . Cardiomyopathy, hypertrophic nonobstructive     ejection fraction 25%  . Anal fissure   . Adenomatous colon polyp   . Diverticulosis   . ICD-CRT     generator change 2013  . Systolic heart failure   . History of colonoscopy   . Heart murmur   . Migraines   . Hyperlipidemia   . Hypertension     History   Social History  . Marital Status: Married    Spouse Name: N/A    Number of Children: 3  . Years of Education: N/A   Occupational History  . Retired     Engineer, petroleum   Social History Main Topics  . Smoking status: Former Smoker -- 0.20 packs/day for 19 years   Types: Cigarettes    Quit date: 01/13/1987  . Smokeless tobacco: Never Used  . Alcohol Use: No  . Drug Use: No  . Sexual Activity: Not Currently   Other Topics Concern  . Not on file   Social History Narrative  . No narrative on file    Past Surgical History  Procedure Laterality Date  . Cardioversion    . Cardiac defibrillator placement  2007    Family History  Problem Relation Age of Onset  . Heart disease Mother   . Cancer Father     colon  . COPD Father   . Heart disease Father   . Cardiomyopathy Daughter   . Cancer Paternal Aunt     BREAST    Allergies  Allergen Reactions  . Amoxicillin     Rash   . Codeine     REACTION: nausea  . Niacin     REACTION: rash  . Procaine Hcl     REACTION: smothers  . Ramipril     REACTION: unspecified  . Statins     REACTION: muscle aches    Current Outpatient Prescriptions on File Prior to Visit  Medication Sig Dispense Refill  . benazepril (LOTENSIN) 10 MG tablet TAKE ONE TABLET BY MOUTH TWICE DAILY  180 tablet  3  . calcipotriene (DOVONOX) 0.005 % cream Apply topically as needed.       . carvedilol (COREG) 12.5 MG tablet TAKE ONE  TABLET BY MOUTH TWICE DAILY WITH A MEAL  180 tablet  1  . clorazepate (TRANXENE) 7.5 MG tablet Take 1 tablet (7.5 mg total) by mouth 2 (two) times daily as needed.  60 tablet  3  . dofetilide (TIKOSYN) 125 MCG capsule Take 1 capsule (125 mcg total) by mouth 2 (two) times daily. Total of 375 bid  180 capsule  3  . furosemide (LASIX) 40 MG tablet 40 mg. Take 1/2 tablet every morning and one tablet every afternoon.      Marland Kitchen HYDROcodone-homatropine (HYCODAN) 5-1.5 MG/5ML syrup TAKE ONE TEASPOONFUL BY MOUTH TWICE DAILY AS NEEDED FOR COUGH  180 mL  1  . hydrOXYzine (ATARAX/VISTARIL) 10 MG tablet Take 1 tablet (10 mg total) by mouth every 8 (eight) hours as needed for itching.  50 tablet  0  . magnesium oxide (MAG-OX 400) 400 MG tablet Take 1 tablet (400 mg total) by mouth daily.  90 tablet  3  .  metoprolol succinate (TOPROL-XL) 50 MG 24 hr tablet Take 1/2 tablet by mouth as needed  90 tablet  1  . PROCTOSOL HC 2.5 % rectal cream USE AS DIRECTED  29 g  3  . SYNTHROID 100 MCG tablet TAKE ONE TABLET BY MOUTH EVERY DAY  90 tablet  3  . valACYclovir (VALTREX) 1000 MG tablet Take 1 tablet (1,000 mg total) by mouth 3 (three) times daily.  21 tablet  0  . vitamin E 400 UNIT capsule Take 400 Units by mouth every other day.       . warfarin (COUMADIN) 5 MG tablet AS DIRECTED  90 tablet  3  . ZETIA 10 MG tablet TAKE ONE TABLET BY MOUTH DAILY.  90 tablet  2   No current facility-administered medications on file prior to visit.    BP 130/78  Pulse 68  Temp(Src) 98.2 F (36.8 C)  Resp 16  Ht 5\' 3"  (1.6 m)  Wt 150 lb (68.04 kg)  BMI 26.58 kg/m2       Objective:   Physical Exam  Constitutional: She is oriented to person, place, and time. She appears well-developed and well-nourished. No distress.  HENT:  Head: Normocephalic and atraumatic.  Eyes: Conjunctivae and EOM are normal. Pupils are equal, round, and reactive to light.  Neck: Normal range of motion. Neck supple. No JVD present. No tracheal deviation present. No thyromegaly present.  Cardiovascular: Normal rate and regular rhythm.   Murmur heard. Pulmonary/Chest: Effort normal. She has wheezes. She exhibits no tenderness.  Abdominal: Soft. Bowel sounds are normal.  Musculoskeletal: Normal range of motion. She exhibits no edema and no tenderness.  Lymphadenopathy:    She has no cervical adenopathy.  Neurological: She is alert and oriented to person, place, and time. She has normal reflexes. No cranial nerve deficit.  Skin: Skin is warm and dry. She is not diaphoretic.  Psychiatric: She has a normal mood and affect. Her behavior is normal.          Assessment & Plan:  Start with the inhaler and if the symptoms quickly changed ... But if the wheezing stays its probable the rate and call cardiology. Use of the metoprolol  reviewed possible mixed etiology   Blood pressure stable

## 2012-10-18 ENCOUNTER — Telehealth: Payer: Self-pay | Admitting: Internal Medicine

## 2012-10-18 NOTE — Telephone Encounter (Signed)
Scheduled patient to come in tomorrow to devic clinic for interrogation, at 9:00am per [facility]. Discussed setting her up for home monitoring, she is agreeable to talk to device about it tomorrow. Patient agreeable to current plans, she questioned any medication changes and I explained that we needed to interrogate pacemaker first and then make decisions from there. She stated she understood.

## 2012-10-18 NOTE — Telephone Encounter (Signed)
New problem    C/O in & out of Afib . Went to see PCP on yesterday . Blood pressure this am  126/78 .pulse 80 this am. Pulse last night 99.

## 2012-10-19 ENCOUNTER — Ambulatory Visit (INDEPENDENT_AMBULATORY_CARE_PROVIDER_SITE_OTHER): Payer: Medicare Other | Admitting: *Deleted

## 2012-10-19 ENCOUNTER — Telehealth: Payer: Self-pay | Admitting: Internal Medicine

## 2012-10-19 DIAGNOSIS — I421 Obstructive hypertrophic cardiomyopathy: Secondary | ICD-10-CM

## 2012-10-19 DIAGNOSIS — I5022 Chronic systolic (congestive) heart failure: Secondary | ICD-10-CM

## 2012-10-19 LAB — ICD DEVICE OBSERVATION
AL IMPEDENCE ICD: 390 Ohm
BAMS-0001: 170 {beats}/min
BAMS-0003: 70 {beats}/min
HV IMPEDENCE: 42 Ohm
LV LEAD IMPEDENCE ICD: 540 Ohm
RV LEAD AMPLITUDE: 12 mv
RV LEAD THRESHOLD: 0.875 V
TZAT-0001FASTVT: 1
TZAT-0001SLOWVT: 1
TZAT-0004FASTVT: 8
TZAT-0004SLOWVT: 8
TZAT-0012FASTVT: 200 ms
TZAT-0018FASTVT: NEGATIVE
TZAT-0019SLOWVT: 7.5 V
TZAT-0020FASTVT: 1 ms
TZAT-0020SLOWVT: 1 ms
TZON-0004FASTVT: 30
TZON-0004SLOWVT: 40
TZON-0005SLOWVT: 6
TZON-0010FASTVT: 40 ms
TZON-0010SLOWVT: 40 ms
TZST-0001FASTVT: 2
TZST-0001FASTVT: 3
TZST-0001FASTVT: 5
TZST-0001SLOWVT: 2
TZST-0001SLOWVT: 3
TZST-0001SLOWVT: 4
TZST-0003FASTVT: 30 J
TZST-0003FASTVT: 40 J
TZST-0003SLOWVT: 30 J
TZST-0003SLOWVT: 40 J
VF: 0

## 2012-10-19 NOTE — Progress Notes (Signed)
CRT-D device check in office. Thresholds and sensing consistent with previous device measurements. Lead impedance trends stable over time. Patient is presently mode switched (AFL with a burden of 26% since 9-24)---max duration of >7 hours, although based on the times of the episodes it appears that she has been in ongoing AF since 2am on 10-7 + Warfarin, and PRN lopressor (no HVRs). Patient bi-ventricularly pacing 90% of the time, but based on trend BiVp appears to be closer to 70% at present. Device programmed with appropriate safety margins. Heart failure diagnostics reviewed and trends are stable for patient. No changes made this session. Estimated longevity >5 years.  Patient will follow up with the device clinic on 01-09-13.

## 2012-10-19 NOTE — Telephone Encounter (Signed)
Spoke with patient   She is feeling lousy and there have been recurring paroxysms of atrial fib per interrogation Issues raised then are failure of tikosyn or secondary casue, ie thryoid anemia all of which were normal 6-8 months ago Will discuss in office amiodarone vs AV ablation

## 2012-10-31 ENCOUNTER — Ambulatory Visit: Payer: Medicare Other

## 2012-10-31 ENCOUNTER — Ambulatory Visit (INDEPENDENT_AMBULATORY_CARE_PROVIDER_SITE_OTHER): Payer: Medicare Other | Admitting: Family

## 2012-10-31 DIAGNOSIS — Z7901 Long term (current) use of anticoagulants: Secondary | ICD-10-CM

## 2012-10-31 DIAGNOSIS — I4891 Unspecified atrial fibrillation: Secondary | ICD-10-CM

## 2012-10-31 LAB — POCT INR: INR: 2.6

## 2012-10-31 NOTE — Patient Instructions (Addendum)
1/2 tablet all days except take 1 tablet on M/W/F.  Re-check in 4 weeks.  Anticoagulation Dose Instructions as of 10/31/2012     Glynis Smiles Tue Wed Thu Fri Sat   New Dose 2.5 mg 5 mg 2.5 mg 5 mg 2.5 mg 5 mg 2.5 mg    Description       1/2 tablet all days except take 1 tablet on M/W/F.  Re-check in 4 weeks.

## 2012-11-03 ENCOUNTER — Ambulatory Visit (INDEPENDENT_AMBULATORY_CARE_PROVIDER_SITE_OTHER): Payer: Medicare Other | Admitting: Internal Medicine

## 2012-11-03 ENCOUNTER — Encounter: Payer: Self-pay | Admitting: Internal Medicine

## 2012-11-03 ENCOUNTER — Other Ambulatory Visit: Payer: Self-pay | Admitting: Internal Medicine

## 2012-11-03 VITALS — BP 132/88 | HR 65 | Ht 63.0 in | Wt 150.0 lb

## 2012-11-03 DIAGNOSIS — I421 Obstructive hypertrophic cardiomyopathy: Secondary | ICD-10-CM

## 2012-11-03 DIAGNOSIS — I5022 Chronic systolic (congestive) heart failure: Secondary | ICD-10-CM

## 2012-11-03 DIAGNOSIS — I4891 Unspecified atrial fibrillation: Secondary | ICD-10-CM

## 2012-11-03 DIAGNOSIS — Z95810 Presence of automatic (implantable) cardiac defibrillator: Secondary | ICD-10-CM

## 2012-11-03 LAB — MAGNESIUM: Magnesium: 2.4 mg/dL (ref 1.5–2.5)

## 2012-11-03 LAB — BASIC METABOLIC PANEL
CO2: 30 mEq/L (ref 19–32)
Chloride: 103 mEq/L (ref 96–112)
Glucose, Bld: 104 mg/dL — ABNORMAL HIGH (ref 70–99)
Sodium: 142 mEq/L (ref 135–145)

## 2012-11-03 MED ORDER — CARVEDILOL 12.5 MG PO TABS: 25.0000 mg | ORAL_TABLET | Freq: Two times a day (BID) | ORAL | Status: DC

## 2012-11-03 NOTE — Assessment & Plan Note (Signed)
Currently euvolemic. She does not tolerate atrial fibrillation well given her HCM. For now we'll continue her on her current course

## 2012-11-03 NOTE — Assessment & Plan Note (Signed)
Stable with LV dysfunction

## 2012-11-03 NOTE — Progress Notes (Signed)
Patient Care Team: [name], MD as PCP - General Currie Paris, MD (General Surgery)   HPI  Sandra [name] is a 79 y.o. female seen in followup for congestive heart failure in the setting of hypertrophic cardiomyopathy with depressed left ventricular function. She is status post CRT-D implantation.  She is on Coumadin. Because of recurrences of atrial fibrillation she is also a put on dofetilide in February.   She is troubled with recurrent episodes of atrial fibrillation which have been quite symptomatic. She is coming in today to discuss amiodarone versus AV junction ablation.  She feels some better that she had. She is quite short of breath when she is in atrial fibrillation. She remains inverse 3 times a day of amiodarone because of potential side effects which she saw her daughter   Past Medical History  Diagnosis Date  . Atrial fibrillation     on Tikosyn and Coumadin  . Hyperlipidemia   . Hypothyroidism   . Bradycardia   . Cardiomyopathy, hypertrophic nonobstructive     ejection fraction 25%  . Anal fissure   . Adenomatous colon polyp   . Diverticulosis   . ICD-CRT     generator change 2013  . Systolic heart failure   . History of colonoscopy   . Migraines   . Hyperlipidemia   . Hypertension     Past Surgical History  Procedure Laterality Date  . Cardioversion    . Cardiac defibrillator placement  2007    Current Outpatient Prescriptions  Medication Sig Dispense Refill  . benazepril (LOTENSIN) 10 MG tablet TAKE ONE TABLET BY MOUTH TWICE DAILY  180 tablet  3  . calcipotriene (DOVONOX) 0.005 % cream Apply topically as needed.       . carvedilol (COREG) 12.5 MG tablet TAKE ONE TABLET BY MOUTH TWICE DAILY WITH A MEAL  180 tablet  1  . clorazepate (TRANXENE) 7.5 MG tablet Take 1 tablet (7.5 mg total) by mouth 2 (two) times daily as needed.  60 tablet  3  . dofetilide (TIKOSYN) 125 MCG capsule Take 1 capsule (125 mcg total) by mouth 2 (two)  times daily. Total of 375 bid  180 capsule  3  . furosemide (LASIX) 40 MG tablet 40 mg. Take 1/2 tablet every morning and one tablet every afternoon.      Marland Kitchen HYDROcodone-homatropine (HYCODAN) 5-1.5 MG/5ML syrup TAKE ONE TEASPOONFUL BY MOUTH TWICE DAILY AS NEEDED FOR COUGH  180 mL  1  . hydrOXYzine (ATARAX/VISTARIL) 10 MG tablet Take 1 tablet (10 mg total) by mouth every 8 (eight) hours as needed for itching.  50 tablet  0  . magnesium oxide (MAG-OX 400) 400 MG tablet Take 1 tablet (400 mg total) by mouth daily.  90 tablet  3  . metoprolol succinate (TOPROL-XL) 50 MG 24 hr tablet Take 1/2 tablet by mouth as needed  90 tablet  1  . PROCTOSOL HC 2.5 % rectal cream USE AS DIRECTED  29 g  3  . SYNTHROID 100 MCG tablet TAKE ONE TABLET BY MOUTH EVERY DAY  90 tablet  3  . valACYclovir (VALTREX) 1000 MG tablet Take 1 tablet (1,000 mg total) by mouth 3 (three) times daily.  21 tablet  0  . vitamin E 400 UNIT capsule Take 400 Units by mouth every other day.       . warfarin (COUMADIN) 5 MG tablet AS DIRECTED  90 tablet  3  . ZETIA 10 MG tablet TAKE  ONE TABLET BY MOUTH DAILY.  90 tablet  2   No current facility-administered medications for this visit.    Allergies  Allergen Reactions  . Procaine Hcl     REACTION: smothers  . Amoxicillin     Rash   . Codeine     REACTION: nausea  . Niacin     REACTION: rash  . Ramipril     REACTION: unspecified  . Statins     REACTION: muscle aches    Review of Systems negative except from HPI and PMH  Physical Exam BP 132/88  Pulse 65  Ht 5\' 3"  (1.6 m)  Wt 150 lb (68.04 kg)  BMI 26.58 kg/m2 Well developed and well nourished in no acute distress HENT normal E scleral and icterus clear Neck Supple JVP flat; carotids brisk and full Clear to ausculation  Regular rate and rhythm, no murmurs gallops or rub Soft with active bowel sounds No clubbing cyanosis none Edema Alert and oriented, grossly normal motor and sensory function Skin Warm and  Dry    Assessment and  Plan

## 2012-11-03 NOTE — Assessment & Plan Note (Signed)
I interrogated the device. She has reverted back to sinus rhythm and it has been so for the last couple days. She is increased her Lasix dose to 80 mg a day. She is also been taking Toprol every night. We will have her stop taking her Toprol increase her carvedilol 12.5--25 twice a day  We will check her surveillance laboratories today

## 2012-11-03 NOTE — Assessment & Plan Note (Signed)
The patient's device was interrogated.  The information was reviewed. No changes were made in the programming.    

## 2012-11-03 NOTE — Patient Instructions (Addendum)
Your physician recommends that you have lab work today: BMET/Mg  Your physician wants you to follow-up in: 4 months with Dr. Graciela Husbands.  You will receive a reminder letter in the mail two months in advance. If you don't receive a letter, please call our office to schedule the follow-up appointment.  Remote monitoring is used to monitor your Pacemaker of ICD from home. This monitoring reduces the number of office visits required to check your device to one time per year. It allows [place] to keep an eye on the functioning of your device to ensure it is working properly. You are scheduled for a device check from home on 02/06/2013. You may send your transmission at any time that day. If you have a wireless device, the transmission will be sent automatically. After your physician reviews your transmission, you will receive a postcard with your next transmission date.  Your physician has recommended you make the following change in your medication:  1) Stop Metoprolol 2) Increase Carvedilol to 25mg  twice daily

## 2012-11-21 ENCOUNTER — Telehealth: Payer: Self-pay | Admitting: *Deleted

## 2012-11-21 ENCOUNTER — Encounter: Payer: Self-pay | Admitting: Internal Medicine

## 2012-11-21 NOTE — Telephone Encounter (Signed)
[employer] approved Tikosyn 125 mcg capsules for tier cost sharing exception at the Tier 3 co-pay through 01/11/2014,  "this decision is based on coverage criteria for the 2015 benefit year and will be effective 01/12/2013" [id number]

## 2012-11-23 ENCOUNTER — Telehealth: Payer: Self-pay | Admitting: Internal Medicine

## 2012-11-23 NOTE — Telephone Encounter (Signed)
New message    Sandra [name] will be faxing a paper for [place] to approve 250mg  of tikosyn-----125mg  has been approved.  Pt want nurse to be looking out for this fax so that it can be completed and returned asap.  You do not have to call pt back.

## 2012-11-28 ENCOUNTER — Ambulatory Visit (INDEPENDENT_AMBULATORY_CARE_PROVIDER_SITE_OTHER): Payer: Medicare Other | Admitting: General Practice

## 2012-11-28 DIAGNOSIS — Z7901 Long term (current) use of anticoagulants: Secondary | ICD-10-CM

## 2012-11-28 LAB — POCT INR: INR: 3.4

## 2012-11-28 NOTE — Progress Notes (Signed)
Pre-visit discussion using our clinic review tool. No additional management support is needed unless otherwise documented below in the visit note.  

## 2012-11-29 ENCOUNTER — Encounter (INDEPENDENT_AMBULATORY_CARE_PROVIDER_SITE_OTHER): Payer: Self-pay

## 2012-11-29 ENCOUNTER — Ambulatory Visit (INDEPENDENT_AMBULATORY_CARE_PROVIDER_SITE_OTHER): Payer: Medicare Other | Admitting: Surgery

## 2012-11-29 ENCOUNTER — Encounter (INDEPENDENT_AMBULATORY_CARE_PROVIDER_SITE_OTHER): Payer: Self-pay | Admitting: Surgery

## 2012-11-29 VITALS — BP 132/70 | HR 76 | Temp 96.9°F | Resp 20 | Ht 63.0 in | Wt 149.0 lb

## 2012-11-29 DIAGNOSIS — N6019 Diffuse cystic mastopathy of unspecified breast: Secondary | ICD-10-CM

## 2012-11-29 NOTE — Progress Notes (Signed)
CHIEF COMPLAINT Breast followup.  History of present illness: This patient has had fibrocystic issues and I followed her annually almost since 1993. She's had no changes since I saw her last year. I told her she could simply followup with her primary care but she wants to be seen here.  EXAMINATION: GENERAL: The patient is alert, oriented, healthy appearing. BREASTS: The breasts are symmetric in appearance. There is a deformity from her pacer on the left. There is no mass, tenderness, nipple or skin changes noted. There is mild fibrocystic regularity which has been present as long as I can remember.  LYMPHATICS: There is no axillary or supraclavicular adenopathy on either side.  DATA REVIEWED Mammogram was done in June, 2014 @ [facility] and is negative (scanned report in Epic).  IMPRESSION Stable exam no suspicious areas noted.  PLANNED She wants to continue to be seen here and knows Dr Dwain Sarna from his operating on her daughter

## 2012-11-29 NOTE — Patient Instructions (Signed)
Continue annual mammograms  

## 2012-12-05 ENCOUNTER — Other Ambulatory Visit: Payer: Self-pay | Admitting: *Deleted

## 2012-12-05 MED ORDER — CARVEDILOL 12.5 MG PO TABS: 12.5000 mg | ORAL_TABLET | Freq: Two times a day (BID) | ORAL | Status: DC

## 2012-12-05 NOTE — Telephone Encounter (Signed)
Advised patient to decrease Carvedilol back down to 12.5 mg twice daily. Patient agreeable to plan.

## 2012-12-05 NOTE — Telephone Encounter (Signed)
New Message   Pt experiencing side effects on Carvedilo, Within 2 weeks of the medication dosage change.. She states she has no energy and is continuously short of breath// she also states that it is making her blood really thin//She wants to cut down on the medication again. Please call back to assist.

## 2012-12-05 NOTE — Telephone Encounter (Signed)
Pt called in.  She said she is not tolerating her Coreg. She has 12.5mg  tablets and she takes 1 1/2 tabs twice a day. She is on same dose of Tikosyn and Lasix. Her chief complaint is extreme fatigue.  SOB only when lying flat. She would like to lower her dose.  States heart rate is 65-72 bpm. Not sure if she is in afib.  Advised pt will discuss with Dr. Graciela Husbands and will call her back this afternoon.

## 2012-12-18 ENCOUNTER — Encounter (HOSPITAL_COMMUNITY): Payer: Self-pay | Admitting: Emergency Medicine

## 2012-12-18 ENCOUNTER — Emergency Department (HOSPITAL_COMMUNITY): Payer: Medicare Other

## 2012-12-18 ENCOUNTER — Emergency Department (HOSPITAL_COMMUNITY)
Admission: EM | Admit: 2012-12-18 | Discharge: 2012-12-18 | Disposition: A | Discharge status: Home / Self Care | Payer: Medicare Other | Attending: Emergency Medicine | Admitting: Emergency Medicine

## 2012-12-18 DIAGNOSIS — Z881 Allergy status to other antibiotic agents status: Secondary | ICD-10-CM | POA: Insufficient documentation

## 2012-12-18 DIAGNOSIS — Z8719 Personal history of other diseases of the digestive system: Secondary | ICD-10-CM | POA: Insufficient documentation

## 2012-12-18 DIAGNOSIS — Z87891 Personal history of nicotine dependence: Secondary | ICD-10-CM | POA: Insufficient documentation

## 2012-12-18 DIAGNOSIS — Z9889 Other specified postprocedural states: Secondary | ICD-10-CM | POA: Insufficient documentation

## 2012-12-18 DIAGNOSIS — F411 Generalized anxiety disorder: Secondary | ICD-10-CM | POA: Insufficient documentation

## 2012-12-18 DIAGNOSIS — I4891 Unspecified atrial fibrillation: Secondary | ICD-10-CM | POA: Insufficient documentation

## 2012-12-18 DIAGNOSIS — Z79899 Other long term (current) drug therapy: Secondary | ICD-10-CM | POA: Insufficient documentation

## 2012-12-18 DIAGNOSIS — R609 Edema, unspecified: Secondary | ICD-10-CM | POA: Insufficient documentation

## 2012-12-18 DIAGNOSIS — Z7901 Long term (current) use of anticoagulants: Secondary | ICD-10-CM | POA: Insufficient documentation

## 2012-12-18 DIAGNOSIS — Z860100 Personal history of colon polyps, unspecified: Secondary | ICD-10-CM | POA: Insufficient documentation

## 2012-12-18 DIAGNOSIS — I422 Other hypertrophic cardiomyopathy: Secondary | ICD-10-CM | POA: Insufficient documentation

## 2012-12-18 DIAGNOSIS — Z95810 Presence of automatic (implantable) cardiac defibrillator: Secondary | ICD-10-CM | POA: Insufficient documentation

## 2012-12-18 DIAGNOSIS — Z8669 Personal history of other diseases of the nervous system and sense organs: Secondary | ICD-10-CM | POA: Insufficient documentation

## 2012-12-18 DIAGNOSIS — M7989 Other specified soft tissue disorders: Secondary | ICD-10-CM | POA: Insufficient documentation

## 2012-12-18 DIAGNOSIS — E039 Hypothyroidism, unspecified: Secondary | ICD-10-CM | POA: Insufficient documentation

## 2012-12-18 DIAGNOSIS — I10 Essential (primary) hypertension: Secondary | ICD-10-CM | POA: Insufficient documentation

## 2012-12-18 DIAGNOSIS — Z888 Allergy status to other drugs, medicaments and biological substances status: Secondary | ICD-10-CM | POA: Insufficient documentation

## 2012-12-18 DIAGNOSIS — I509 Heart failure, unspecified: Secondary | ICD-10-CM | POA: Insufficient documentation

## 2012-12-18 DIAGNOSIS — E785 Hyperlipidemia, unspecified: Secondary | ICD-10-CM | POA: Insufficient documentation

## 2012-12-18 DIAGNOSIS — Z8679 Personal history of other diseases of the circulatory system: Secondary | ICD-10-CM | POA: Insufficient documentation

## 2012-12-18 DIAGNOSIS — Z885 Allergy status to narcotic agent status: Secondary | ICD-10-CM | POA: Insufficient documentation

## 2012-12-18 LAB — COMPREHENSIVE METABOLIC PANEL
ALT: 20 U/L (ref 0–35)
Albumin: 3.8 g/dL (ref 3.5–5.2)
Alkaline Phosphatase: 58 U/L (ref 39–117)
BUN: 31 mg/dL — ABNORMAL HIGH (ref 6–23)
CO2: 25 mEq/L (ref 19–32)
Chloride: 102 mEq/L (ref 96–112)
GFR calc Af Amer: 36 mL/min — ABNORMAL LOW (ref >90)
GFR calc non Af Amer: 31 mL/min — ABNORMAL LOW (ref >90)
Glucose, Bld: 147 mg/dL — ABNORMAL HIGH (ref 70–99)
Potassium: 4.1 mEq/L (ref 3.5–5.1)
Sodium: 140 mEq/L (ref 135–145)
Total Bilirubin: 0.6 mg/dL (ref 0.3–1.2)
Total Protein: 6.7 g/dL (ref 6.0–8.3)

## 2012-12-18 LAB — CBC WITH DIFFERENTIAL/PLATELET
Basophils Relative: 0 % (ref 0–1)
HCT: 37.5 % (ref 36.0–46.0)
Hemoglobin: 11.8 g/dL — ABNORMAL LOW (ref 12.0–15.0)
Lymphs Abs: 1.2 10*3/uL (ref 0.7–4.0)
Monocytes Absolute: 0.5 10*3/uL (ref 0.1–1.0)
Monocytes Relative: 9 % (ref 3–12)
Neutro Abs: 3.7 10*3/uL (ref 1.7–7.7)
Neutrophils Relative %: 65 % (ref 43–77)
RBC: 4.06 MIL/uL (ref 3.87–5.11)

## 2012-12-18 LAB — URINALYSIS, ROUTINE W REFLEX MICROSCOPIC
Bilirubin Urine: NEGATIVE
Glucose, UA: NEGATIVE mg/dL
Hgb urine dipstick: NEGATIVE
Ketones, ur: NEGATIVE mg/dL
Leukocytes, UA: NEGATIVE
Protein, ur: 30 mg/dL — AB
Specific Gravity, Urine: 1.01 (ref 1.005–1.030)
pH: 6.5 (ref 5.0–8.0)

## 2012-12-18 LAB — URINE MICROSCOPIC-ADD ON

## 2012-12-18 LAB — TROPONIN I: Troponin I: <0.3 ng/mL (ref <0.30)

## 2012-12-18 MED ORDER — FUROSEMIDE 10 MG/ML IJ SOLN
80.0000 mg | Freq: Once | INTRAMUSCULAR | Status: AC
  Administered 2012-12-18: 80 mg via INTRAVENOUS
  Filled 2012-12-18: qty 8

## 2012-12-18 NOTE — ED Provider Notes (Signed)
CSN: [id number]     Arrival date & time 12/18/12  0240 History   First MD Initiated Contact with Patient 12/18/12 0248     Chief Complaint  Patient presents with  . Shortness of Breath   (Consider location/radiation/quality/duration/timing/severity/associated sxs/prior Treatment) HPI Patient with a history of hypertrophic cardiomyopathy with EF of 25% and atrial fibrillation presents with shortness of breath on awaking this evening. She's had gradual increased shortness of breath with exertion and cough with clear sputum for the past few days. She's had mild lower extremity bilateral swelling. She states that she is currently taking 80 mg Lasix a day. This is unchanged recently. Denies any fevers or chills. She denies specifically that she's had no chest pain. Patient states that her shortness of breath has improved since being in an emergency department. Past Medical History  Diagnosis Date  . Atrial fibrillation     on Tikosyn and Coumadin  . Hyperlipidemia   . Hypothyroidism   . Bradycardia   . Cardiomyopathy, hypertrophic nonobstructive     ejection fraction 25%  . Anal fissure   . Adenomatous colon polyp   . Diverticulosis   . ICD-CRT     generator change 2013  . Systolic heart failure   . History of colonoscopy   . Migraines   . Hyperlipidemia   . Hypertension    Past Surgical History  Procedure Laterality Date  . Cardioversion    . Cardiac defibrillator placement  2007   Family History  Problem Relation Age of Onset  . Heart disease Mother   . Cancer Father     colon  . COPD Father   . Heart disease Father   . Cardiomyopathy Daughter   . Cancer Paternal Aunt     BREAST   History  Substance Use Topics  . Smoking status: Former Smoker -- 0.20 packs/day for 19 years    Types: Cigarettes    Quit date: 01/13/1987  . Smokeless tobacco: Never Used  . Alcohol Use: No   OB History   Grav Para Term Preterm Abortions TAB SAB Ect Mult Living                  Review of Systems  Constitutional: Negative for fever and chills.  HENT: Negative for congestion and sore throat.   Respiratory: Positive for cough and shortness of breath. Negative for chest tightness and wheezing.   Cardiovascular: Positive for leg swelling. Negative for chest pain and palpitations.  Gastrointestinal: Negative for nausea, vomiting, abdominal pain, diarrhea and constipation.  Musculoskeletal: Negative for back pain, myalgias, neck pain and neck stiffness.  Skin: Negative for rash and wound.  Neurological: Negative for dizziness, weakness, light-headedness, numbness and headaches.  All other systems reviewed and are negative.    Allergies  Procaine hcl; Amoxicillin; Codeine; Niacin; Ramipril; and Statins  Home Medications   Current Outpatient Rx  Name  Route  Sig  Dispense  Refill  . benazepril (LOTENSIN) 10 MG tablet      TAKE ONE TABLET BY MOUTH TWICE DAILY   180 tablet   3   . calcipotriene (DOVONOX) 0.005 % cream   Topical   Apply topically as needed.          . carvedilol (COREG) 12.5 MG tablet   Oral   Take 1 tablet (12.5 mg total) by mouth 2 (two) times daily with a meal.         . dofetilide (TIKOSYN) 125 MCG capsule   Oral  Take 1 capsule (125 mcg total) by mouth 2 (two) times daily. Total of 375 bid   180 capsule   3   . furosemide (LASIX) 40 MG tablet      40 mg. Take 1/2 tablet every morning and one tablet every afternoon.         . magnesium oxide (MAG-OX 400) 400 MG tablet   Oral   Take 1 tablet (400 mg total) by mouth daily.   90 tablet   3   . PROCTOSOL HC 2.5 % rectal cream      USE AS DIRECTED   29 g   3   . SYNTHROID 100 MCG tablet      TAKE ONE TABLET BY MOUTH EVERY DAY   90 tablet   3     Dispense as written.   . valACYclovir (VALTREX) 1000 MG tablet   Oral   Take 1 tablet (1,000 mg total) by mouth 3 (three) times daily.   21 tablet   0   . vitamin E 400 UNIT capsule   Oral   Take 400 Units by  mouth every other day.          . warfarin (COUMADIN) 5 MG tablet      AS DIRECTED   90 tablet   3   . ZETIA 10 MG tablet      TAKE ONE TABLET BY MOUTH DAILY.   90 tablet   3    BP 138/85  Pulse 65  Resp 17  SpO2 98% Physical Exam  Nursing note and vitals reviewed. Constitutional: She is oriented to person, place, and time. She appears well-developed and well-nourished. No distress.  Patient is anxious appearing  HENT:  Head: Normocephalic and atraumatic.  Mouth/Throat: Oropharynx is clear and moist. No oropharyngeal exudate.  Eyes: EOM are normal. Pupils are equal, round, and reactive to light.  Neck: Normal range of motion. Neck supple.  Cardiovascular: Normal rate and regular rhythm.   Pulmonary/Chest: Effort normal. No respiratory distress. She has no wheezes. She has no rales.  Few scattered rhonchi. patient speaking in full sentences.  Abdominal: Soft. Bowel sounds are normal. She exhibits no distension and no mass. There is no tenderness. There is no rebound and no guarding.  Musculoskeletal: Normal range of motion. She exhibits edema. She exhibits no tenderness.  Bilateral 1+ pitting edema.  Neurological: She is alert and oriented to person, place, and time.  Results from this without deficit. Sensation grossly intact.  Skin: Skin is warm and dry. No rash noted. No erythema.  Psychiatric: She has a normal mood and affect. Her behavior is normal.    ED Course  Procedures (including critical care time) Labs Review Labs Reviewed  PRO B NATRIURETIC PEPTIDE  CBC WITH DIFFERENTIAL  COMPREHENSIVE METABOLIC PANEL  TROPONIN I  PROTIME-INR  URINALYSIS, ROUTINE W REFLEX MICROSCOPIC   Imaging Review No results found.  EKG Interpretation   None       MDM   Patient states her shortness of breath is improving significantly. She is resting comfortably at this time.  Patient states she is at her baseline. Her ambulatory O2 saturations are in the mid-nineties.  We'll discharge home to followup with her cardiologist. Return precautions have been given.   Loren Racer, MD 12/19/12 [phone]

## 2012-12-18 NOTE — ED Notes (Signed)
Patient with acute shortness of breath in to ED via private vehicle.  Patient states she woke up this morning feeling this way.  Patient is nauseated and feels like she is going to vomit.  Patient with bilat rales in bases.

## 2012-12-19 ENCOUNTER — Other Ambulatory Visit: Payer: Self-pay | Admitting: *Deleted

## 2012-12-19 ENCOUNTER — Telehealth: Payer: Self-pay | Admitting: Internal Medicine

## 2012-12-19 ENCOUNTER — Ambulatory Visit (INDEPENDENT_AMBULATORY_CARE_PROVIDER_SITE_OTHER): Payer: Medicare Other | Admitting: General Practice

## 2012-12-19 DIAGNOSIS — I4891 Unspecified atrial fibrillation: Secondary | ICD-10-CM

## 2012-12-19 LAB — POCT INR: INR: 2.4

## 2012-12-19 MED ORDER — LEVOFLOXACIN 250 MG PO TABS: 250.0000 mg | ORAL_TABLET | Freq: Every day | ORAL | Status: DC

## 2012-12-19 MED ORDER — CIPROFLOXACIN HCL 250 MG PO TABS: 250.0000 mg | ORAL_TABLET | Freq: Every day | ORAL | Status: DC

## 2012-12-19 NOTE — Telephone Encounter (Signed)
New Problem    Pt Went to Er 12/7 and was told she has fluid build up.the  Pt needs to know what she should do Er told her to call her Cardiologist.

## 2012-12-19 NOTE — Progress Notes (Signed)
Pre-visit discussion using our clinic review tool. No additional management support is needed unless otherwise documented below in the visit note.  

## 2012-12-19 NOTE — Telephone Encounter (Signed)
App made and accepted by pt.  Pt seen in ED/ 12/7 and was told she has fluid build up.  Per [facility] RN/ ask pt to come for an app w Brooke PA to manage her medications. Pt was accepting of an app.

## 2012-12-19 NOTE — Telephone Encounter (Signed)
Pt complaining of laryngitis, states she was seen in the ED on yesterday and chest x-ray was clear.  She thinks she needs an antibiotic to clear this up.  Pt has an appt at 11:30 today at [facility].

## 2012-12-19 NOTE — Telephone Encounter (Signed)
Pt needs an app tomorrow 12/20/12 with Nehemiah Settle PA for optivol check and medication management.

## 2012-12-19 NOTE — Telephone Encounter (Signed)
Per dr Yvonne Kendall have levaquin- Left message on machine for pt

## 2012-12-20 ENCOUNTER — Encounter: Payer: Self-pay | Admitting: Cardiology

## 2012-12-20 ENCOUNTER — Other Ambulatory Visit: Payer: Self-pay | Admitting: Internal Medicine

## 2012-12-20 ENCOUNTER — Ambulatory Visit (INDEPENDENT_AMBULATORY_CARE_PROVIDER_SITE_OTHER): Payer: Medicare Other | Admitting: Cardiology

## 2012-12-20 VITALS — BP 115/64 | HR 69 | Ht 63.0 in | Wt 147.0 lb

## 2012-12-20 DIAGNOSIS — I5022 Chronic systolic (congestive) heart failure: Secondary | ICD-10-CM

## 2012-12-20 DIAGNOSIS — I422 Other hypertrophic cardiomyopathy: Secondary | ICD-10-CM

## 2012-12-20 DIAGNOSIS — I4891 Unspecified atrial fibrillation: Secondary | ICD-10-CM

## 2012-12-20 DIAGNOSIS — I519 Heart disease, unspecified: Secondary | ICD-10-CM

## 2012-12-20 NOTE — Patient Instructions (Signed)
Keep recall visit in system with Dr. Graciela Husbands  Your physician recommends that you continue on your current medications as directed. Please refer to the Current Medication list given to you today.

## 2012-12-22 ENCOUNTER — Ambulatory Visit: Payer: Medicare Other

## 2012-12-22 LAB — MDC_IDC_ENUM_SESS_TYPE_INCLINIC
Battery Remaining Longevity: 56.4 mo
Brady Statistic RA Percent Paced: 85 %
Brady Statistic RV Percent Paced: 97 %
Lead Channel Impedance Value: 350 Ohm
Lead Channel Impedance Value: 400 Ohm
Lead Channel Impedance Value: 550 Ohm
Lead Channel Pacing Threshold Pulse Width: 0.5 ms
Lead Channel Sensing Intrinsic Amplitude: 2 mV
Lead Channel Setting Pacing Amplitude: 2 V
Lead Channel Setting Pacing Amplitude: 2 V
Lead Channel Setting Pacing Amplitude: 2 V
Lead Channel Setting Pacing Pulse Width: 0.5 ms
Lead Channel Setting Sensing Sensitivity: 0.5 mV
Zone Setting Detection Interval: 350 ms

## 2012-12-22 NOTE — Progress Notes (Signed)
ELECTROPHYSIOLOGY OFFICE NOTE  Patient ID: Sandra [name] MRN: [id number], DOB/AGE: 06/06/1932   Date of Visit: 12/22/2012  Primary Physician: Carrie Mew, MD Primary Cardiologist: Berton Mount, MD Reason for Visit: Hospital follow-up  History of Present Illness  Sandra [name] is a 79 y.o. female with HCM and LV dysfunction, EF  , s/p CRT-D implant and PAF who presents today for follow-up regarding possible HF exacerbation after being seen in the ED on Saturday. Sandra [name] reports she starting "coming down with a cold" last weekend. She has had dry, hacking cough since last Thursday. She states, "I have always done this, even since I was a little girl. If I get a cold, I get a terrible cough." She reports being evaluated on multiple occasions by ENT who felt she "just had a sensitive trachea." At times she coughs so hard that she gags and begins to feel SOB. She has felt this way before when she develops cold or URI symptoms. She has also had sinus congestion and hoarseness. She denies fever. She states this cough and SOB feels different than her HF.   On Saturday she experienced "a coughing spell" and she had vomiting and SOB with it, prompting her family to bring her to the ED. CXR showed no acute abnormality. Once her cough resolved, her SOB resolved and she was feeling better. She was discharged to follow-up today. Of note, when she notified her PCP of her symptoms, she was given Levaquin. She has only taken one dose.   She denies chest pain or exertional shortness of breath. She denies palpitations, dizziness, near syncope or syncope. She denies LE swelling, orthopnea, PND or recent weight gain. She is compliant and tolerating medications without difficulty.  Past Medical History Past Medical History  Diagnosis Date  . Atrial fibrillation     on Tikosyn and Coumadin  . Hyperlipidemia   . Hypothyroidism   . Bradycardia   . Cardiomyopathy, hypertrophic nonobstructive    ejection fraction 25%  . Anal fissure   . Adenomatous colon polyp   . Diverticulosis   . ICD-CRT     generator change 2013  . Systolic heart failure   . History of colonoscopy   . Migraines   . Hyperlipidemia   . Hypertension     Past Surgical History Past Surgical History  Procedure Laterality Date  . Cardioversion    . Cardiac defibrillator placement  2007    Allergies/Intolerances Allergies  Allergen Reactions  . Procaine Hcl     REACTION: smothers  . Amoxicillin     Rash   . Codeine     REACTION: nausea  . Niacin     REACTION: rash  . Ramipril     REACTION: unspecified  . Statins     REACTION: muscle aches    Current Home Medications Current Outpatient Prescriptions  Medication Sig Dispense Refill  . benazepril (LOTENSIN) 10 MG tablet Take 10 mg by mouth 2 (two) times daily.      . carvedilol (COREG) 12.5 MG tablet Take 1 tablet (12.5 mg total) by mouth 2 (two) times daily with a meal.      . dofetilide (TIKOSYN) 125 MCG capsule Take 125 mcg by mouth 2 (two) times daily. Takes with cpasule to = 392mcg/dose      . dofetilide (TIKOSYN) 250 MCG capsule Take 250 mcg by mouth 2 (two) times daily. Takes with to = 375 mcg/dose      . ezetimibe (ZETIA) 10 MG  tablet Take 10 mg by mouth daily.      . furosemide (LASIX) 40 MG tablet Take 40 mg by mouth 2 (two) times daily. Takes 0.5 tablet in the morning, and 1 tablet in the afternoon      . HYDROcodone-homatropine (HYCODAN) 5-1.5 MG/5ML syrup Take 5 mLs by mouth 2 (two) times daily as needed for cough.      Marland Kitchen levofloxacin (LEVAQUIN) 250 MG tablet Take 1 tablet (250 mg total) by mouth daily.  7 tablet  0  . levothyroxine (SYNTHROID, LEVOTHROID) 100 MCG tablet Take 100 mcg by mouth daily before breakfast.      . magnesium oxide (MAG-OX 400) 400 MG tablet Take 1 tablet (400 mg total) by mouth daily.  90 tablet  3  . valACYclovir (VALTREX) 1000 MG tablet Take 1 tablet (1,000 mg total) by mouth 3 (three) times  daily.  21 tablet  0  . vitamin E 400 UNIT capsule Take 400 Units by mouth every other day.       . warfarin (COUMADIN) 5 MG tablet Take 2.5-5 mg by mouth See admin instructions. Takes 1 tablet on Monday and Friday. Takes 0.5 tablet (2.5mg ) all other days.       No current facility-administered medications for this visit.    Social History Social History  . Marital Status: Married   Social History Main Topics  . Smoking status: Former Smoker -- 0.20 packs/day for 19 years    Types: Cigarettes    Quit date: 01/13/1987  . Smokeless tobacco: Never Used  . Alcohol Use: No  . Drug Use: No   Review of Systems General: No chills, fever, night sweats or weight changes Cardiovascular: No chest pain, dyspnea on exertion, edema, orthopnea, palpitations, paroxysmal nocturnal dyspnea Dermatological: No rash, lesions or masses Respiratory: No cough, dyspnea Urologic: No hematuria, dysuria Abdominal: No nausea, vomiting, diarrhea, bright red blood per rectum, melena, or hematemesis Neurologic: No visual changes, weakness, changes in mental status All other systems reviewed and are otherwise negative except as noted above.  Physical Exam Vitals: Blood pressure 115/64, pulse 69, height 5\' 3"  (1.6 m), weight 147 lb (66.679 kg).  General: Well developed, well appearing 79 y.o. female in no acute distress. HEENT: Normocephalic, atraumatic. EOMs intact. Sclera nonicteric. Oropharynx clear.  Neck: Supple without bruits. No JVD. Lungs: Respirations regular and unlabored, CTA bilaterally. No wheezes, rales or rhonchi. Heart: RRR. S1, S2 present. No murmurs, rub, S3 or S4. Abdomen: Soft, non-tender, non-distended. BS present x 4 quadrants. No hepatosplenomegaly.  Extremities: No clubbing, cyanosis or edema. PT/Radials 2+ and equal bilaterally. Psych: Normal affect. Neuro: Alert and oriented X 3. Moves all extremities spontaneously.   Diagnostics Chest x-ray 12/18/2012 FINDINGS: Portable view of  the chest demonstrates a left cardiac ICD. Stable enlargement of the heart. Coarse lung markings appear chronic without frank pulmonary edema. Limited evaluation of the left lung base without definite pleural fluid or consolidation. IMPRESSION: Chronic lung changes without acute findings. Recent labs 12/18/2012 Troponin <0.03 Pro BNP 6603    Component Value Date/Time   WBC 5.7 12/18/2012 0307   RBC 4.06 12/18/2012 0307   HGB 11.8* 12/18/2012 0307   HCT 37.5 12/18/2012 0307   PLT 185 12/18/2012 0307   MCV 92.4 12/18/2012 0307   MCH 29.1 12/18/2012 0307   MCHC 31.5 12/18/2012 0307   RDW 14.4 12/18/2012 0307   LYMPHSABS 1.2 12/18/2012 0307   MONOABS 0.5 12/18/2012 0307   EOSABS 0.2 12/18/2012 0307   BASOSABS 0.0 12/18/2012  1191      Component Value Date/Time   NA 140 12/18/2012 0307   K 4.1 12/18/2012 0307   CL 102 12/18/2012 0307   CO2 25 12/18/2012 0307   GLUCOSE 147* 12/18/2012 0307   GLUCOSE 72 11/04/2005 1547   BUN 31* 12/18/2012 0307   CREATININE 1.54* 12/18/2012 0307   CREATININE 1.57* 03/20/2011 1513   CALCIUM 8.9 12/18/2012 0307   GFRNONAA 31* 12/18/2012 0307   GFRAA 36* 12/18/2012 0307    Device interrogation today - Quick look for CorVue and episodes only. CorVue trends are stable. No episodes / no evidence of any ventricular arrhythmias. Full device interrogation performed 11/03/2012 and normal.  Assessment and Plan 1. Chronic systolic HF - stable; does not appear volume overloaded by exam at this time - CorVue shows volume status / impedance has improved - continue current diuretic regimen 2. PAF - AT/AF burden 12% by quick look interrogation today which is improved from 24% in October - longest mode switch episode 11 minutes 36 seconds in duration - continue current regimen with Tikosyn as directed by Dr. Graciela Husbands - she was instructed to stop Levaquin as this medication is contraindicated for use with Tikosyn - continue warfarin for stroke prevention  Signed, Pharrell Ledford,  PA-C 12/22/2012, 4:10 PM

## 2012-12-23 ENCOUNTER — Telehealth: Payer: Self-pay | Admitting: Cardiology

## 2012-12-23 NOTE — Telephone Encounter (Signed)
Called to follow-up with patient regarding symptoms. She reports she is feeling "much better." She is back to her normal self. She has not had any further cough or SOB since Monday. She is able to perform usual activities without limitation or difficulty. She will keep her scheduled follow-up with Dr. Graciela Husbands unless needed sooner.   Kaleigha Chamberlin [place], PA-C

## 2013-01-09 ENCOUNTER — Telehealth: Payer: Self-pay | Admitting: Internal Medicine

## 2013-01-09 ENCOUNTER — Other Ambulatory Visit: Payer: Self-pay | Admitting: *Deleted

## 2013-01-09 MED ORDER — CEFUROXIME AXETIL 250 MG PO TABS: 250.0000 mg | ORAL_TABLET | Freq: Two times a day (BID) | ORAL | Status: DC

## 2013-01-09 NOTE — Telephone Encounter (Signed)
Per dr Lovell Sheehan- may have ceftin 250 bid for 10 days-husband informed

## 2013-01-09 NOTE — Telephone Encounter (Signed)
Patient Information:  [name] Name: Kennesha  Phone: [phone]  Patient: Sandra [name], Sandra [name]  Gender: Female  DOB: 1932/12/18  Age: 79 Years  PCP: Darryll Capers (Adults only)  Office Follow Up:  Does the office need to follow up with this patient?: Yes  Instructions For The Office: Abx request for cough x 3 weeks.  RN Note:  Patient requesting abx because she never got to complete the Levaquin due to interference with her Tikosyne.  Rx's sent to [facility] in [place];  Call back # Cell:  [phone].  Symptoms  Reason For Call & Symptoms: Cough, feels like she "closes up" and can't breath at times with coughing; States a croupy cough, if talking alot loses voice and has a hard time breathing;  Cough is productive but clear.  Afebrile.  Seen in er 12/18/12 after starting Levaquin due to heart racing, f/u with her cardiologist.  Was not able to complete Levaquin that had been ordered, and was not put on any other abx's.  Reviewed Health History In EMR: Yes  Reviewed Medications In EMR: Yes  Reviewed Allergies In EMR: Yes  Reviewed Surgeries / Procedures: Yes  Date of Onset of Symptoms: 12/19/2012  Treatments Tried: Cool air; Mucinex; Had a sample of a cough syrup given by MD which helps.  Treatments Tried Worked: No  Guideline(s) Used:  Cough  Disposition Per Guideline:   See Within 3 Days in Office  Reason For Disposition Reached:   Cough has been present for > 10 days  Advice Given:  Call Back If:  Difficulty breathing  Cough lasts more than 3 weeks  Fever lasts > 3 days  You become worse.  Patient Refused Recommendation:  Patient Requests Prescription  Request abx for continuous cough present x 3 wks, unable to complete Levaquin due to interference with her Tikosyne.

## 2013-01-16 ENCOUNTER — Ambulatory Visit (INDEPENDENT_AMBULATORY_CARE_PROVIDER_SITE_OTHER): Payer: Medicare Other | Admitting: General Practice

## 2013-01-16 ENCOUNTER — Ambulatory Visit: Payer: Medicare Other

## 2013-01-16 DIAGNOSIS — I4891 Unspecified atrial fibrillation: Secondary | ICD-10-CM

## 2013-01-16 LAB — POCT INR: INR: 2.9

## 2013-01-16 NOTE — Progress Notes (Signed)
Pre-visit discussion using our clinic review tool. No additional management support is needed unless otherwise documented below in the visit note.  

## 2013-01-18 ENCOUNTER — Telehealth: Payer: Self-pay | Admitting: Internal Medicine

## 2013-01-18 NOTE — Telephone Encounter (Signed)
Pt would like a new rx hydrocodone cough syrup. Pt still has 2 abx pills left.

## 2013-01-18 NOTE — Telephone Encounter (Signed)
Dr Arnoldo Morale out os the office- encourged pt to come in and see md if still coughing for >1 month,but she refused ,stating she would call back if she didn't get better

## 2013-01-25 ENCOUNTER — Other Ambulatory Visit: Payer: Self-pay

## 2013-01-25 MED ORDER — DOFETILIDE 250 MCG PO CAPS: 250.0000 ug | ORAL_CAPSULE | Freq: Two times a day (BID) | ORAL | Status: DC

## 2013-01-25 MED ORDER — DOFETILIDE 125 MCG PO CAPS: 125.0000 ug | ORAL_CAPSULE | Freq: Two times a day (BID) | ORAL | Status: DC

## 2013-01-26 ENCOUNTER — Other Ambulatory Visit: Payer: Self-pay

## 2013-01-26 MED ORDER — DOFETILIDE 250 MCG PO CAPS: 250.0000 ug | ORAL_CAPSULE | Freq: Two times a day (BID) | ORAL | Status: DC

## 2013-01-26 MED ORDER — DOFETILIDE 125 MCG PO CAPS: 125.0000 ug | ORAL_CAPSULE | Freq: Two times a day (BID) | ORAL | Status: DC

## 2013-02-13 ENCOUNTER — Ambulatory Visit (INDEPENDENT_AMBULATORY_CARE_PROVIDER_SITE_OTHER): Payer: Medicare Other | Admitting: General Practice

## 2013-02-13 DIAGNOSIS — Z5181 Encounter for therapeutic drug level monitoring: Secondary | ICD-10-CM

## 2013-02-13 DIAGNOSIS — I4891 Unspecified atrial fibrillation: Secondary | ICD-10-CM

## 2013-02-13 LAB — POCT INR: INR: 2.7

## 2013-02-13 NOTE — Progress Notes (Signed)
Pre-visit discussion using our clinic review tool. No additional management support is needed unless otherwise documented below in the visit note.  

## 2013-02-27 ENCOUNTER — Encounter: Payer: Self-pay | Admitting: Internal Medicine

## 2013-02-27 ENCOUNTER — Ambulatory Visit (INDEPENDENT_AMBULATORY_CARE_PROVIDER_SITE_OTHER): Payer: Medicare Other | Admitting: Internal Medicine

## 2013-02-27 VITALS — BP 140/86 | HR 76 | Temp 98.0°F | Resp 16 | Ht 63.0 in | Wt 146.0 lb

## 2013-02-27 DIAGNOSIS — J45991 Cough variant asthma: Secondary | ICD-10-CM

## 2013-02-27 DIAGNOSIS — I421 Obstructive hypertrophic cardiomyopathy: Secondary | ICD-10-CM

## 2013-02-27 MED ORDER — HYDROCOD POLST-CHLORPHEN POLST 10-8 MG/5ML PO LQCR: 5.0000 mL | Freq: Two times a day (BID) | ORAL | Status: DC

## 2013-02-27 NOTE — Progress Notes (Signed)
   Subjective:    Patient ID: Sandra [name], female    DOB: [birth year], 80 y.o.   MRN: [id number]  Atrial Fibrillation Symptoms include shortness of breath. Past medical history includes atrial fibrillation.    COugh wiyth cold that resulted in cough asthma (Reactive airway ) Stable AF and HOCM No chest pain In AF  Review of Systems  Constitutional: Negative.   HENT: Negative.   Respiratory: Positive for cough, choking and shortness of breath.   Gastrointestinal: Negative.   Endocrine: Negative.   Genitourinary: Positive for urgency.  Neurological: Negative.   Hematological: Negative.   Psychiatric/Behavioral: Negative.        Objective:   Physical Exam  Nursing note and vitals reviewed. Constitutional: She appears well-developed and well-nourished.  HENT:  Head: Normocephalic and atraumatic.  Eyes: Conjunctivae are normal. Pupils are equal, round, and reactive to light.  Cardiovascular: Intact distal pulses.   Murmur heard. Irregular   Pulmonary/Chest: Breath sounds normal.  Abdominal: Soft. Bowel sounds are normal.  Musculoskeletal: She exhibits edema and tenderness.  Neurological: She is alert.  Psychiatric: She has a normal mood and affect. Her behavior is normal.          Assessment & Plan:   Cough asthma with severe coughing up a difficult medicine to prophylax her that should she develop another upper respiratory tract infection Stable CV followed by cardiology Added levofloxin to allergies due to rate issues

## 2013-02-27 NOTE — Progress Notes (Signed)
Pre visit review using our clinic review tool, if applicable. No additional management support is needed unless otherwise documented below in the visit note. 

## 2013-02-27 NOTE — Progress Notes (Signed)
.  llbpc

## 2013-02-27 NOTE — Patient Instructions (Signed)
The patient is instructed to continue all medications as prescribed. Schedule followup with check out clerk upon leaving the clinic  

## 2013-03-27 ENCOUNTER — Ambulatory Visit (INDEPENDENT_AMBULATORY_CARE_PROVIDER_SITE_OTHER): Payer: Medicare Other | Admitting: General Practice

## 2013-03-27 DIAGNOSIS — Z5181 Encounter for therapeutic drug level monitoring: Secondary | ICD-10-CM

## 2013-03-27 LAB — POCT INR: INR: 2.3

## 2013-03-27 NOTE — Progress Notes (Signed)
Pre visit review using our clinic review tool, if applicable. No additional management support is needed unless otherwise documented below in the visit note. 

## 2013-04-07 ENCOUNTER — Ambulatory Visit (INDEPENDENT_AMBULATORY_CARE_PROVIDER_SITE_OTHER): Payer: Medicare Other | Admitting: Internal Medicine

## 2013-04-07 ENCOUNTER — Encounter: Payer: Self-pay | Admitting: Internal Medicine

## 2013-04-07 VITALS — BP 120/76 | HR 65 | Ht 63.0 in | Wt 148.8 lb

## 2013-04-07 DIAGNOSIS — I421 Obstructive hypertrophic cardiomyopathy: Secondary | ICD-10-CM

## 2013-04-07 DIAGNOSIS — I5022 Chronic systolic (congestive) heart failure: Secondary | ICD-10-CM

## 2013-04-07 DIAGNOSIS — I480 Paroxysmal atrial fibrillation: Secondary | ICD-10-CM

## 2013-04-07 DIAGNOSIS — I4891 Unspecified atrial fibrillation: Secondary | ICD-10-CM

## 2013-04-07 DIAGNOSIS — Z95810 Presence of automatic (implantable) cardiac defibrillator: Secondary | ICD-10-CM

## 2013-04-07 LAB — MDC_IDC_ENUM_SESS_TYPE_INCLINIC
Battery Remaining Longevity: 51.6 mo
Brady Statistic RA Percent Paced: 85 %
Brady Statistic RV Percent Paced: 97 %
HIGH POWER IMPEDANCE MEASURED VALUE: 40 Ohm
Implantable Pulse Generator Model: 3257
Lead Channel Impedance Value: 375 Ohm
Lead Channel Impedance Value: 525 Ohm
Lead Channel Pacing Threshold Amplitude: 1.5 V
Lead Channel Pacing Threshold Pulse Width: 0.5 ms
Lead Channel Pacing Threshold Pulse Width: 0.5 ms
Lead Channel Setting Pacing Amplitude: 2 V
Lead Channel Setting Pacing Amplitude: 2.5 V
Lead Channel Setting Pacing Pulse Width: 0.5 ms
Lead Channel Setting Sensing Sensitivity: 0.5 mV
MDC IDC MSMT LEADCHNL RA SENSING INTR AMPL: 2 mV
MDC IDC MSMT LEADCHNL RV IMPEDANCE VALUE: 325 Ohm
MDC IDC MSMT LEADCHNL RV PACING THRESHOLD AMPLITUDE: 0.875 V
MDC IDC MSMT LEADCHNL RV SENSING INTR AMPL: 12 mV
MDC IDC PG SERIAL: 7032457
MDC IDC SESS DTM: 20150327125801
MDC IDC SET LEADCHNL LV PACING AMPLITUDE: 2 V
MDC IDC SET LEADCHNL RV PACING PULSEWIDTH: 0.5 ms
MDC IDC SET ZONE DETECTION INTERVAL: 260 ms
MDC IDC SET ZONE DETECTION INTERVAL: 300 ms
Zone Setting Detection Interval: 350 ms

## 2013-04-07 LAB — MAGNESIUM: MAGNESIUM: 2.4 mg/dL (ref 1.5–2.5)

## 2013-04-07 NOTE — Progress Notes (Signed)
Patient Care Team: Ricard Dillon, MD as PCP - General Haywood Lasso, MD (General Surgery)   HPI  Sandra [name] is a 80 y.o. female seen in followup for congestive heart failure in the setting of hypertrophic cardiomyopathy with depressed left ventricular function. She is status post CRT-D implantation.  She is on Coumadin. Because of recurrences of atrial fibrillation she is also a put on dofetilide    She is troubled with recurrent episodes of atrial fibrillation which have been quite symptomatic. She is coming in today to discuss amiodarone versus AV junction ablation.  She feels some better that she had. She is quite short of breath when she is in atrial fibrillation.      Past Medical History  Diagnosis Date  . Atrial fibrillation     on Tikosyn and Coumadin  . Hyperlipidemia   . Hypothyroidism   . Bradycardia   . Cardiomyopathy, hypertrophic nonobstructive     ejection fraction 25%  . Anal fissure   . Adenomatous colon polyp   . Diverticulosis   . ICD-CRT     generator change 2013  . Systolic heart failure   . History of colonoscopy   . Migraines   . Hyperlipidemia   . Hypertension     Past Surgical History  Procedure Laterality Date  . Cardioversion    . Cardiac defibrillator placement  2007    Current Outpatient Prescriptions  Medication Sig Dispense Refill  . benazepril (LOTENSIN) 10 MG tablet Take 10 mg by mouth 2 (two) times daily.      . carvedilol (COREG) 12.5 MG tablet Take 1 tablet (12.5 mg total) by mouth 2 (two) times daily with a meal.      . dofetilide (TIKOSYN) 125 MCG capsule Take 1 capsule (125 mcg total) by mouth 2 (two) times daily. Takes with 253mcg cpasule to = 33mcg/dose  180 capsule  3  . ezetimibe (ZETIA) 10 MG tablet Take 10 mg by mouth daily.      . furosemide (LASIX) 40 MG tablet Take 40 mg by mouth 2 (two) times daily. Takes 0.5 tablet in the morning, and 1 tablet in the afternoon      . levothyroxine (SYNTHROID,  LEVOTHROID) 100 MCG tablet Take 100 mcg by mouth daily before breakfast.      . magnesium oxide (MAG-OX) 400 MG tablet Take 400 mg by mouth as directed. Three times a week      . vitamin E 400 UNIT capsule Take 400 Units by mouth every other day.       . warfarin (COUMADIN) 5 MG tablet Take 2.5-5 mg by mouth See admin instructions. Takes 1 tablet on Monday and Friday. Takes 0.5 tablet (2.5mg ) all other days.       No current facility-administered medications for this visit.    Allergies  Allergen Reactions  . Procaine Hcl     REACTION: smothers  . Amoxicillin     Rash   . Codeine     REACTION: nausea  . Levofloxacin     Due to cardiac  AF  . Niacin     REACTION: rash  . Ramipril     REACTION: unspecified  . Statins     REACTION: muscle aches    Review of Systems negative except from HPI and PMH  Physical Exam BP 120/76  Pulse 65  Ht 5\' 3"  (1.6 m)  Wt 148 lb 12.8 oz (67.495 kg)  BMI 26.37 kg/m2 Well developed  and well nourished in no acute distress HENT normal E scleral and icterus clear Neck Supple JVP flat; carotids brisk and full Clear to ausculation Device pocket well healed; without hematoma or erythema.  There is no tethering   Regular rate and rhythm, no murmurs gallops or rub Soft with active bowel sounds No clubbing cyanosis  Edema Alert and oriented, grossly normal motor and sensory function Skin Warm and Dry  ECG demonstrates AV pacing Interval 24/17/51 Axis leftward Assessment and  Plan  Hypertrophic cardiomyopathy  Left ventricular dysfunction  Implantable defibrillator-CRT-[employer]  The patient's device was interrogated.  The information was reviewed. The AV delay was shortened on a paced AV 250--200   Atrial fibrillation -on dofetilide  Congestive heart failure-chronic-systolic  She is euvolemic and continues with intermittent atrial fibrillation. She has had no ventricular tachycardia. She is euvolemic. She will continue her current  medications. Her potassium levels were normal in December. We'll check her magnesium level today.

## 2013-04-07 NOTE — Patient Instructions (Signed)
Your physician recommends that you return for lab work today: Magnesium  Your physician recommends that you continue on your current medications as directed. Please refer to the Current Medication list given to you today.  Remote monitoring is used to monitor your Pacemaker of ICD from home. This monitoring reduces the number of office visits required to check your device to one time per year. It allows [place] to keep an eye on the functioning of your device to ensure it is working properly. You are scheduled for a device check from home on 07/10/13. You may send your transmission at any time that day. If you have a wireless device, the transmission will be sent automatically. After your physician reviews your transmission, you will receive a postcard with your next transmission date.  Your physician wants you to follow-up in: 1 year with Dr. Caryl Comes.  You will receive a reminder letter in the mail two months in advance. If you don't receive a letter, please call our office to schedule the follow-up appointment.

## 2013-05-05 ENCOUNTER — Other Ambulatory Visit: Payer: Self-pay | Admitting: Internal Medicine

## 2013-05-08 ENCOUNTER — Ambulatory Visit: Payer: Medicare Other

## 2013-05-08 ENCOUNTER — Ambulatory Visit (INDEPENDENT_AMBULATORY_CARE_PROVIDER_SITE_OTHER): Payer: Medicare Other | Admitting: General Practice

## 2013-05-08 ENCOUNTER — Telehealth: Payer: Self-pay | Admitting: Internal Medicine

## 2013-05-08 DIAGNOSIS — I4891 Unspecified atrial fibrillation: Secondary | ICD-10-CM

## 2013-05-08 DIAGNOSIS — Z5181 Encounter for therapeutic drug level monitoring: Secondary | ICD-10-CM

## 2013-05-08 LAB — POCT INR: INR: 2.5

## 2013-05-08 NOTE — Telephone Encounter (Addendum)
Pt would like dr Arnoldo Morale recommendation as to who she  should  see at [place]. Pt would like to rsc her aug 2015 to sooner appt

## 2013-05-08 NOTE — Progress Notes (Signed)
Pre visit review using our clinic review tool, if applicable. No additional management support is needed unless otherwise documented below in the visit note. 

## 2013-05-18 ENCOUNTER — Encounter: Payer: Self-pay | Admitting: Family Medicine

## 2013-05-18 ENCOUNTER — Ambulatory Visit (INDEPENDENT_AMBULATORY_CARE_PROVIDER_SITE_OTHER): Payer: Medicare Other | Admitting: Family Medicine

## 2013-05-18 ENCOUNTER — Telehealth: Payer: Self-pay | Admitting: Internal Medicine

## 2013-05-18 VITALS — BP 100/70 | HR 70 | Temp 98.3°F | Ht 63.0 in | Wt 149.5 lb

## 2013-05-18 DIAGNOSIS — R059 Cough, unspecified: Secondary | ICD-10-CM

## 2013-05-18 DIAGNOSIS — R05 Cough: Secondary | ICD-10-CM

## 2013-05-18 DIAGNOSIS — J309 Allergic rhinitis, unspecified: Secondary | ICD-10-CM

## 2013-05-18 MED ORDER — PREDNISONE 20 MG PO TABS: 40.0000 mg | ORAL_TABLET | Freq: Every day | ORAL | Status: DC

## 2013-05-18 MED ORDER — ALBUTEROL SULFATE HFA 108 (90 BASE) MCG/ACT IN AERS: 2.0000 | INHALATION_SPRAY | Freq: Four times a day (QID) | RESPIRATORY_TRACT | Status: DC | PRN

## 2013-05-18 MED ORDER — FLUTICASONE PROPIONATE 50 MCG/ACT NA SUSP: 2.0000 | Freq: Every day | NASAL | Status: DC

## 2013-05-18 NOTE — Patient Instructions (Signed)
-  flonase daily 2 sprays each nostirl for 21 days  -albuterol per instructions as needed  -prednisone for 3 days if worsening  -follow up if worsening or persists

## 2013-05-18 NOTE — Progress Notes (Signed)
Pre visit review using our clinic review tool, if applicable. No additional management support is needed unless otherwise documented below in the visit note. 

## 2013-05-18 NOTE — Telephone Encounter (Signed)
Patient Information:  [name] Name: Karleigh  Phone: [phone]  Patient: Sandra [name], Sandra [name]  Gender: Female  DOB: [birth year]  Age: 80 Years  PCP: Benay Pillow (Adults only)  Office Follow Up:  Does the office need to follow up with this patient?: No  Instructions For The Office: N/A   Symptoms  Reason For Call & Symptoms: Patient reports she has laryngitis and feels that she can't get air in.  She went to ED for similar symptoms previously.  She stuck her head in the freezer with some relief and used inhaler with some relief.  She states, I do not want to go to ED; "It's just the croup I think."  She is currently able to talk in full sentences without pausing.  Emergent symptoms ruled out.  Go to Office Now per Cough protocol due to Wheezing is present.  Patient reports the symptoms are worst in her throat; it happens in spring and fall every year since high school.  Reviewed Health History In EMR: Yes  Reviewed Medications In EMR: Yes  Reviewed Allergies In EMR: Yes  Reviewed Surgeries / Procedures: Yes  Date of Onset of Symptoms: 05/18/2013  Treatments Tried: Cool air, INhaler  Treatments Tried Worked: Yes  Guideline(s) Used:  Cough  Disposition Per Guideline:   Go to Office Now  Reason For Disposition Reached:   Wheezing is present  Advice Given:  Reassurance  Here is some care advice that should help.  Cough Medicines:  OTC Cough Syrups: The most common cough suppressant in OTC cough medications is dextromethorphan. Often the letters "DM" appear in the name.  OTC Cough Drops: Cough drops can help a lot, especially for mild coughs. They reduce coughing by soothing your irritated throat and removing that tickle sensation in the back of the throat. Cough drops also have the advantage of portability - you can carry them with you.  Home Remedy - Hard Candy: Hard candy works just as well as medicine-flavored OTC cough drops. Diabetics should use sugar-free candy.  Coughing  Spasms:  Drink warm fluids. Inhale warm mist (Reason: both relax the airway and loosen up the phlegm).  Prevent Dehydration:  Drink adequate liquids.  Call Back If:  You become worse.  Patient Will Follow Care Advice:  YES  Appointment Scheduled:  05/18/2013 10:15:00 Appointment Scheduled Provider:  Maudie Mercury (TEXT 1st, after 20 mins can call), Jarrett Soho [facility])

## 2013-05-18 NOTE — Progress Notes (Signed)
No chief complaint on file.   HPI:  Acute visit for cough: -hx of ? cough variant asthma per review of notes -reports hx of this twice yearly, treated ED in the past -symptoms: PND, layrngitis, cough, nasal congestion - denies symptoms in chest, more in nose and throat, very anxious about this -denies: CP, SOB in chest, fevers, sinus pain, tooth pain -needs refill on albuterol  ROS: See pertinent positives and negatives per HPI.  Past Medical History  Diagnosis Date  . Atrial fibrillation     on Tikosyn and Coumadin  . Hyperlipidemia   . Hypothyroidism   . Bradycardia   . Cardiomyopathy, hypertrophic nonobstructive     ejection fraction 25%  . Anal fissure   . Adenomatous colon polyp   . Diverticulosis   . ICD-CRT     generator change 2013  . Systolic heart failure   . History of colonoscopy   . Migraines   . Hyperlipidemia   . Hypertension     Past Surgical History  Procedure Laterality Date  . Cardioversion    . Cardiac defibrillator placement  2007    Family History  Problem Relation Age of Onset  . Heart disease Mother   . Cancer Father     colon  . COPD Father   . Heart disease Father   . Cardiomyopathy Daughter   . Cancer Paternal Aunt     BREAST    History   Social History  . Marital Status: Married    Spouse Name: N/A    Number of Children: 3  . Years of Education: N/A   Occupational History  . Retired     Probation officer   Social History Main Topics  . Smoking status: Former Smoker -- 0.20 packs/day for 19 years    Types: Cigarettes    Quit date: 01/13/1987  . Smokeless tobacco: Never Used  . Alcohol Use: No  . Drug Use: No  . Sexual Activity: Not Currently   Other Topics Concern  . None   Social History Narrative  . None    Current outpatient prescriptions:benazepril (LOTENSIN) 10 MG tablet, Take 10 mg by mouth 2 (two) times daily., Disp: , Rfl: ;  carvedilol (COREG) 12.5 MG tablet, Take 1 tablet (12.5 mg total) by mouth 2  (two) times daily with a meal., Disp: , Rfl: ;  clorazepate (TRANXENE) 7.5 MG tablet, TAKE ONE TABLET BY MOUTH TWICE DAILY AS NEEDED, Disp: 60 tablet, Rfl: 0 dofetilide (TIKOSYN) 125 MCG capsule, Take 1 capsule (125 mcg total) by mouth 2 (two) times daily. Takes with 263mcg cpasule to = 360mcg/dose, Disp: 180 capsule, Rfl: 3;  ezetimibe (ZETIA) 10 MG tablet, Take 10 mg by mouth daily., Disp: , Rfl: ;  furosemide (LASIX) 40 MG tablet, TAKE ONE TABLET BY MOUTH TWICE DAILY, Disp: 180 tablet, Rfl: 3 levothyroxine (SYNTHROID, LEVOTHROID) 100 MCG tablet, Take 100 mcg by mouth daily before breakfast., Disp: , Rfl: ;  magnesium oxide (MAG-OX) 400 MG tablet, Take 400 mg by mouth as directed. Three times a week, Disp: , Rfl: ;  vitamin E 400 UNIT capsule, Take 400 Units by mouth every other day. , Disp: , Rfl:  warfarin (COUMADIN) 5 MG tablet, Take 2.5-5 mg by mouth See admin instructions. Takes 1 tablet on Monday and Friday. Takes 0.5 tablet (2.5mg ) all other days., Disp: , Rfl: ;  albuterol (PROVENTIL HFA;VENTOLIN HFA) 108 (90 BASE) MCG/ACT inhaler, Inhale 2 puffs into the lungs every 6 (six) hours as needed for  wheezing or shortness of breath., Disp: 1 Inhaler, Rfl: 0 fluticasone (FLONASE) 50 MCG/ACT nasal spray, Place 2 sprays into both nostrils daily., Disp: 16 g, Rfl: 1;  predniSONE (DELTASONE) 20 MG tablet, Take 2 tablets (40 mg total) by mouth daily with breakfast., Disp: 6 tablet, Rfl: 0  EXAM:  Filed Vitals:   05/18/13 1011  BP: 100/70  Pulse: 70  Temp: 98.3 F (36.8 C)    Body mass index is 26.49 kg/(m^2).  GENERAL: vitals reviewed and listed above, alert, oriented, appears well hydrated and in no acute distress  HEENT: atraumatic, conjunttiva clear, no obvious abnormalities on inspection of external nose and ears, normal appearance of ear canals and TMs, clear nasal congestion, mild post oropharyngeal erythema with PND, no tonsillar edema or exudate, no sinus TTP  NECK: no obvious masses on  inspection  LUNGS: clear to auscultation bilaterally, no wheezes, rales or rhonchi, good air movement, O2 95% on RA  CV: HRRR, no peripheral edema  MS: moves all extremities without noticeable abnormality  PSYCH: pleasant and cooperative, no obvious depression or anxiety  ASSESSMENT AND PLAN:  Discussed the following assessment and plan:  Cough - Plan: fluticasone (FLONASE) 50 MCG/ACT nasal spray, predniSONE (DELTASONE) 20 MG tablet, albuterol (PROVENTIL HFA;VENTOLIN HFA) 108 (90 BASE) MCG/ACT inhaler  Allergic rhinitis - Plan: fluticasone (FLONASE) 50 MCG/ACT nasal spray  -I do not think this is asthma or lung related as with detailed hx sounds like symptoms are in throat and nose. She reports she has seen pulm and ENT for this. -She wanted an antibiotic but after discussion likely etiologies, risks opted to hold on this as more likely allergic or viral. -tx per orders and instructions with return precautions discussed -Patient advised to return or notify a doctor immediately if symptoms worsen or persist or new concerns arise.  Patient Instructions  -flonase daily 2 sprays each nostirl for 21 days  -albuterol per instructions as needed  -prednisone for 3 days if worsening  -follow up if worsening or persists     Lucretia Kern

## 2013-06-13 ENCOUNTER — Ambulatory Visit (INDEPENDENT_AMBULATORY_CARE_PROVIDER_SITE_OTHER): Payer: Medicare Other | Admitting: Family Medicine

## 2013-06-13 ENCOUNTER — Encounter: Payer: Self-pay | Admitting: Family Medicine

## 2013-06-13 VITALS — BP 110/80 | HR 98 | Wt 149.0 lb

## 2013-06-13 DIAGNOSIS — IMO0001 Reserved for inherently not codable concepts without codable children: Secondary | ICD-10-CM | POA: Insufficient documentation

## 2013-06-13 DIAGNOSIS — R059 Cough, unspecified: Secondary | ICD-10-CM

## 2013-06-13 DIAGNOSIS — M1710 Unilateral primary osteoarthritis, unspecified knee: Secondary | ICD-10-CM

## 2013-06-13 DIAGNOSIS — L03019 Cellulitis of unspecified finger: Secondary | ICD-10-CM

## 2013-06-13 DIAGNOSIS — R05 Cough: Secondary | ICD-10-CM

## 2013-06-13 MED ORDER — CEPHALEXIN 500 MG PO CAPS: ORAL_CAPSULE | ORAL | Status: DC

## 2013-06-13 MED ORDER — PREDNISONE 20 MG PO TABS: ORAL_TABLET | ORAL | Status: DC

## 2013-06-13 MED ORDER — TRAMADOL HCL 50 MG PO TABS: ORAL_TABLET | ORAL | Status: DC

## 2013-06-13 NOTE — Progress Notes (Signed)
   Subjective:    Patient ID: Sandra [name], female    DOB: 1932-03-18, 80 y.o.   MRN: [id number]  HPI Sandra [name] is a 80 year old female nonsmoker who comes in today for evaluation of 2 problems  She states that last week she pulled a piece of skin from her left index finger and on Friday he noticed it became red hot and swollen. She's been soaking it in warm water but it hasn't resolved.  She's had problems with osteoarthritis in both right and left hips. Sunday morning it started bothering her left hip. She's unable to take anti-inflammatories because she is on Coumadin. No history of trauma  Her med list said allergic to cat he female her on inquiry she says it just makes her feel funny. She does not gets hives airway obstruction   Review of Systems Neurologic musculoskeletal review of systems otherwise negative  She got hives from penicillin and amoxicillin as far as we know there is no side effect from cephalosporins    Objective:   Physical Exam  Well-developed and nourished female no acute distress examination the hand shows the left index finger to have a paronychia. No evidence of infection in the palm  In the supine position the right hip was normal. The left hip shows tenderness with external rotation.      Assessment & Plan:  Paronychia left index finger....... continue warm soaks and Keflex 1 g twice a day  Osteoarthritis left hip,,,,,,,,,,, tramadol 50 mg.....Marland Kitchen one half tab 3 times a day for couple days and then once infection her fingers cleared up we'll have her take a short course of prednisone.Marland Kitchen

## 2013-06-13 NOTE — Progress Notes (Signed)
Pre visit review using our clinic review tool, if applicable. No additional management support is needed unless otherwise documented below in the visit note. 

## 2013-06-13 NOTE — Patient Instructions (Signed)
Tramadol 50 mg........ one half tab 3 times daily when necessary for hip pain  Keflex 500 mg........ 2 tabs twice daily for the infection in her finger  Continue the warm soaks 3 times daily  When the infection resolves you can take a short course of prednisone  20 mg...........Marland Kitchen 1 tab for 5 days, a half a tab for 5 days, then a half a tab Monday Wednesday Friday for a two-week taper

## 2013-06-13 NOTE — Telephone Encounter (Signed)
Patient Information:  [name] Name: Nike  Phone: [phone]  Patient: Sandra [name], Sandra [name]  Gender: Female  DOB: February 10, 1932  Age: 80 Years  PCP: Benay Pillow (Adults only)  Office Follow Up:  Does the office need to follow up with this patient?: No  Instructions For The Office: N/A  RN Note:  RN spoke with Derinda Late and appt was given 0915 with Dr. Sherren Mocha.  Information relayed to patient.  On the way to office now.  Symptoms  Reason For Call & Symptoms: Pt calling, L hip pain, onset 06/11/13, still hurting this am 06/13/13, able to walk, currently on blood thinner, mother has h/o blood clot  Reviewed Health History In EMR: Yes  Reviewed Medications In EMR: Yes  Reviewed Allergies In EMR: Yes  Reviewed Surgeries / Procedures: Yes  Date of Onset of Symptoms: 06/11/2013  Treatments Tried: Tylenol with minimal relief  Treatments Tried Worked: Yes  Guideline(s) Used:  Leg Pain  Disposition Per Guideline:   Go to ED Now (or to Office with PCP Approval)  Reason For Disposition Reached:   History of inherited increased risk of blood clots (e.g., factor 5 Leiden, antithrombin 3, protein C or protein S deficiency, prothrombin mutation)  Advice Given:  N/A  Patient Will Follow Care Advice:  YES  Appointment Scheduled: Per Derinda Late in office:   06/13/2013 09:15:00 Appointment Scheduled Provider:  Stevie Kern (Family Practice)

## 2013-06-19 ENCOUNTER — Ambulatory Visit (INDEPENDENT_AMBULATORY_CARE_PROVIDER_SITE_OTHER): Payer: Medicare Other | Admitting: General Practice

## 2013-06-19 ENCOUNTER — Encounter: Payer: Self-pay | Admitting: Family

## 2013-06-19 ENCOUNTER — Ambulatory Visit (INDEPENDENT_AMBULATORY_CARE_PROVIDER_SITE_OTHER)
Admission: RE | Admit: 2013-06-19 | Discharge: 2013-06-19 | Disposition: A | Discharge status: Home / Self Care | Payer: Medicare Other | Source: Ambulatory Visit | Attending: Family | Admitting: Family

## 2013-06-19 ENCOUNTER — Telehealth: Payer: Self-pay | Admitting: Internal Medicine

## 2013-06-19 ENCOUNTER — Ambulatory Visit (INDEPENDENT_AMBULATORY_CARE_PROVIDER_SITE_OTHER): Payer: Medicare Other | Admitting: Family

## 2013-06-19 ENCOUNTER — Ambulatory Visit: Payer: Medicare Other

## 2013-06-19 VITALS — BP 124/80 | HR 68 | Temp 98.0°F | Wt 148.0 lb

## 2013-06-19 DIAGNOSIS — I4891 Unspecified atrial fibrillation: Secondary | ICD-10-CM

## 2013-06-19 DIAGNOSIS — M25559 Pain in unspecified hip: Secondary | ICD-10-CM

## 2013-06-19 DIAGNOSIS — Z5181 Encounter for therapeutic drug level monitoring: Secondary | ICD-10-CM

## 2013-06-19 DIAGNOSIS — L03019 Cellulitis of unspecified finger: Secondary | ICD-10-CM

## 2013-06-19 LAB — POCT INR: INR: 2

## 2013-06-19 NOTE — Telephone Encounter (Signed)
Per [name] since pt does not have swelling it is very unlikely that it is a DVT.  If it were a DVT pt is already on an anticoagulation medication. It is probably a musculoskeletal problem and pt needs to follow up with Rodman Key or Abby Potash on tomorrow.  Called and spoke with pt and pt is aware of appt today at 11:15 pt [place].  Pt has a coumadin appt at 11:30

## 2013-06-19 NOTE — Progress Notes (Signed)
Subjective:    Patient ID: Sandra [name], female    DOB: 09/29/1932, 80 y.o.   MRN: [id number]  Hip Pain    80 y.o. White female presents with chief complaint of "left hip pain". The pain began last Sunday while at church, the patient describes initial pain as a clicking pain. The pain has continued since that time and is described as dull, aching pain to left hip; she rates the pain as a 5; the pain radiates from outer hip to inner hip and to ankle; the pain is made worse when standing and decreases when she takes tylenol. Pt also describes pain to her second finger on left hand that has continued since being seen last Tuesday. She describes swelling and pain as "so much better" but it is still red and sore to touch. Pt states that her mother had a similar issue and required hand surgery. Pt did try to lance the finger herself and describes that nothing came out. Denies fever, fatigue, SOB, and change in appetite.     Review of Systems  Constitutional: Positive for activity change.       Difficulty walking from hip pain   HENT: Negative.   Respiratory: Negative.   Cardiovascular: Negative.   Gastrointestinal: Negative.   Endocrine: Negative.   Musculoskeletal: Positive for arthralgias.       Pain in left hip and second left finger.   Allergic/Immunologic: Negative.   Neurological: Positive for weakness.       To left hip  Hematological: Negative.   Psychiatric/Behavioral: Negative.    Past Medical History  Diagnosis Date  . Atrial fibrillation     on Tikosyn and Coumadin  . Hyperlipidemia   . Hypothyroidism   . Bradycardia   . Cardiomyopathy, hypertrophic nonobstructive     ejection fraction 25%  . Anal fissure   . Adenomatous colon polyp   . Diverticulosis   . ICD-CRT     generator change 2013  . Systolic heart failure   . History of colonoscopy   . Migraines   . Hyperlipidemia   . Hypertension     History   Social History  . Marital Status: Married    Spouse  Name: N/A    Number of Children: 3  . Years of Education: N/A   Occupational History  . Retired     Probation officer   Social History Main Topics  . Smoking status: Former Smoker -- 0.20 packs/day for 19 years    Types: Cigarettes    Quit date: 01/13/1987  . Smokeless tobacco: Never Used  . Alcohol Use: No  . Drug Use: No  . Sexual Activity: Not Currently   Other Topics Concern  . Not on file   Social History Narrative  . No narrative on file    Past Surgical History  Procedure Laterality Date  . Cardioversion    . Cardiac defibrillator placement  2007    Family History  Problem Relation Age of Onset  . Heart disease Mother   . Cancer Father     colon  . COPD Father   . Heart disease Father   . Cardiomyopathy Daughter   . Cancer Paternal Aunt     BREAST    Allergies  Allergen Reactions  . Procaine Hcl     REACTION: smothers  . Amoxicillin     Rash   . Codeine     REACTION: nausea  . Levofloxacin     Due to cardiac  AF  . Niacin     REACTION: rash  . Ramipril     REACTION: unspecified  . Statins     REACTION: muscle aches    Current Outpatient Prescriptions on File Prior to Visit  Medication Sig Dispense Refill  . albuterol (PROVENTIL HFA;VENTOLIN HFA) 108 (90 BASE) MCG/ACT inhaler Inhale 2 puffs into the lungs every 6 (six) hours as needed for wheezing or shortness of breath.  1 Inhaler  0  . benazepril (LOTENSIN) 10 MG tablet Take 10 mg by mouth 2 (two) times daily.      . carvedilol (COREG) 12.5 MG tablet Take 1 tablet (12.5 mg total) by mouth 2 (two) times daily with a meal.      . cephALEXin (KEFLEX) 500 MG capsule 2 by mouth twice a day  40 capsule  1  . clorazepate (TRANXENE) 7.5 MG tablet TAKE ONE TABLET BY MOUTH TWICE DAILY AS NEEDED  60 tablet  0  . dofetilide (TIKOSYN) 125 MCG capsule Take 1 capsule (125 mcg total) by mouth 2 (two) times daily. Takes with 258mcg cpasule to = 345mcg/dose  180 capsule  3  . ezetimibe (ZETIA) 10 MG tablet  Take 10 mg by mouth daily.      . fluticasone (FLONASE) 50 MCG/ACT nasal spray Place 2 sprays into both nostrils daily.  16 g  1  . furosemide (LASIX) 40 MG tablet TAKE ONE TABLET BY MOUTH TWICE DAILY  180 tablet  3  . levothyroxine (SYNTHROID, LEVOTHROID) 100 MCG tablet Take 100 mcg by mouth daily before breakfast.      . magnesium oxide (MAG-OX) 400 MG tablet Take 400 mg by mouth as directed. Three times a week      . predniSONE (DELTASONE) 20 MG tablet 1 tab x5 days, a half a tab x5 days, then a half a tab Monday Wednesday Friday for a two-week taper  40 tablet  1  . traMADol (ULTRAM) 50 MG tablet One half tab 3 times daily when necessary for hip pain  50 tablet  1  . vitamin E 400 UNIT capsule Take 400 Units by mouth every other day.       . warfarin (COUMADIN) 5 MG tablet Take 2.5-5 mg by mouth See admin instructions. Takes 1 tablet on Monday and Friday. Takes 0.5 tablet (2.5mg ) all other days.       No current facility-administered medications on file prior to visit.    BP 124/80  Pulse 68  Temp(Src) 98 F (36.7 C) (Oral)  Wt 148 lb (67.132 kg)chart    Objective:   Physical Exam  Constitutional: She is oriented to person, place, and time. She appears well-developed and well-nourished. She is active.  HENT:  Head: Normocephalic.  Cardiovascular: Normal rate, regular rhythm, normal heart sounds and normal pulses.   Pulmonary/Chest: Effort normal and breath sounds normal.  Abdominal: Soft. Normal appearance and bowel sounds are normal.  Musculoskeletal:  Decrease ROM in left hip. Stiffness with abduction and adduction. Decrease ROM to second left finger. Redness, swelling present to finger   Neurological: She is alert and oriented to person, place, and time.  Skin: Skin is warm and dry. There is erythema.  Second left finger   Psychiatric: She has a normal mood and affect. Her speech is normal and behavior is normal. Thought content normal.          Assessment & Plan:    Arlinda was seen today for hip pain.  Diagnoses and associated orders for this  visit:  Hip pain - DG Hip Complete Left; Future  Paronychia of finger - Ambulatory referral to Hand Surgery   Education: ROM exercise for hip. Continue antibiotics as prescribed and steroids.   Follow up: pending xray

## 2013-06-19 NOTE — Patient Instructions (Signed)
Hip Pain  The hips join the upper legs to the lower pelvis. The bones, cartilage, tendons, and muscles of the hip joint perform a lot of work each day holding your body weight and allowing you to move around.  Hip pain is a common symptom. It can range from a minor ache to severe pain on 1 or both hips. Pain may be felt on the inside of the hip joint near the groin, or the outside near the buttocks and upper thigh. There may be swelling or stiffness as well. It occurs more often when a person walks or performs activity. There are many reasons hip pain can develop.  CAUSES   It is important to work with your caregiver to identify the cause since many conditions can impact the bones, cartilage, muscles, and tendons of the hips. Causes for hip pain include:   Broken (fractured) bones.   Separation of the thighbone from the hip socket (dislocation).   Torn cartilage of the hip joint.   Swelling (inflammation) of a tendon (tendonitis), the sac within the hip joint (bursitis), or a joint.   A weakening in the abdominal wall (hernia), affecting the nerves to the hip.   Arthritis in the hip joint or lining of the hip joint.   Pinched nerves in the back, hip, or upper thigh.   A bulging disc in the spine (herniated disc).   Rarely, bone infection or cancer.  DIAGNOSIS   The location of your hip pain will help your caregiver understand what may be causing the pain. A diagnosis is based on your medical history, your symptoms, results from your physical exam, and results from diagnostic tests. Diagnostic tests may include X-ray exams, a computerized magnetic scan (magnetic resonance imaging, MRI), or bone scan.  TREATMENT   Treatment will depend on the cause of your hip pain. Treatment may include:   Limiting activities and resting until symptoms improve.   Crutches or other walking supports (a cane or brace).   Ice, elevation, and compression.   Physical therapy or home exercises.   Shoe inserts or special  shoes.   Losing weight.   Medications to reduce pain.   Undergoing surgery.  HOME CARE INSTRUCTIONS    Only take over-the-counter or prescription medicines for pain, discomfort, or fever as directed by your caregiver.   Put ice on the injured area:   Put ice in a plastic bag.   Place a towel between your skin and the bag.   Leave the ice on for 15-20 minutes at a time, 03-04 times a day.   Keep your leg raised (elevated) when possible to lessen swelling.   Avoid activities that cause pain.   Follow specific exercises as directed by your caregiver.   Sleep with a pillow between your legs on your most comfortable side.   Record how often you have hip pain, the location of the pain, and what it feels like. This information may be helpful to you and your caregiver.   Ask your caregiver about returning to work or sports and whether you should drive.   Follow up with your caregiver for further exams, therapy, or testing as directed.  SEEK MEDICAL CARE IF:    Your pain or swelling continues or worsens after 1 week.   You are feeling unwell or have chills.   You have increasing difficulty with walking.   You have a loss of sensation or other new symptoms.   You have questions or concerns.  SEEK   IMMEDIATE MEDICAL CARE IF:    You cannot put weight on the affected hip.   You have fallen.   You have a sudden increase in pain and swelling in your hip.   You have a fever.  MAKE SURE YOU:    Understand these instructions.   Will watch your condition.   Will get help right away if you are not doing well or get worse.  Document Released: 06/18/2009 Document Revised: 03/23/2011 Document Reviewed: 06/18/2009  [employer] Patient Information 2014 [employer].

## 2013-06-19 NOTE — Progress Notes (Signed)
Pre visit review using our clinic review tool, if applicable. No additional management support is needed unless otherwise documented below in the visit note. 

## 2013-06-19 NOTE — Telephone Encounter (Signed)
Patient Information:  [name] Name: Taleigha  Phone: [phone]  Patient: Sandra, [name]  Gender: Female  DOB: 11/08/1932  Age: 80 Years  PCP: Benay Pillow (Adults only, leaving end of July 2015)  Office Follow Up:  Does the office need to follow up with this patient?: Yes  Instructions For The Office: Call to let her know if Xray can be scheduled and if she needs to go to ER to be checked for DVT.   Symptoms  Reason For Call & Symptoms: Calling about seen in the office on 06/13/13 for L Hip pain- onset 06/11/13. Sx were getting better until this morning-06/19/13. Hurts to weight on leg. Tramadol causes itching so she is taking Advil and Tylenol and helps some. L ankle is sore when she turns it outward and back of leg feels crampy. Pain in hip radiates down into L groin and L thigh. She is coming into office at 1130 to have PT/INR. Supposed to go to beach with kids on 06/24/13 and wants to be sure that leg and hip okay. Requesting Xray or dopper to see if arthritis and make sure not a DVT.  Reviewed Health History In EMR: Yes  Reviewed Medications In EMR: Yes  Reviewed Allergies In EMR: Yes  Reviewed Surgeries / Procedures: Yes  Date of Onset of Symptoms: 06/11/2013  Treatments Tried: Ibuprofen 2 tabs po, Tylenol 2 tabs PO  Treatments Tried Worked: Yes  Guideline(s) Used:  Leg Pain  Disposition Per Guideline:   Go to ED Now (or to Office with PCP Approval)  Reason For Disposition Reached:   Thigh or calf pain in only one leg and present > 1 hour  Advice Given:  Call Back If:  Moderate pain (e.g., limping) lasts more than 3 days  Mild pain lasts more than 7 days  Signs of infection occur (e.g., spreading redness, warmth, fever)  You become worse.  Patient Refused Recommendation:  Patient Will Follow Up With Office Later  Please call patient to let her know if she needs to go to ED or if she can be checked in the office. Coming in at 11:30 for PT/INR.

## 2013-07-10 ENCOUNTER — Encounter: Payer: Self-pay | Admitting: Internal Medicine

## 2013-07-10 ENCOUNTER — Ambulatory Visit (INDEPENDENT_AMBULATORY_CARE_PROVIDER_SITE_OTHER): Payer: Medicare Other | Admitting: *Deleted

## 2013-07-10 DIAGNOSIS — I5022 Chronic systolic (congestive) heart failure: Secondary | ICD-10-CM

## 2013-07-10 DIAGNOSIS — I422 Other hypertrophic cardiomyopathy: Secondary | ICD-10-CM

## 2013-07-10 LAB — MDC_IDC_ENUM_SESS_TYPE_REMOTE
Battery Remaining Longevity: 53 mo
Battery Remaining Percentage: 68 %
Brady Statistic AP VP Percent: 93 %
Brady Statistic AS VP Percent: 5.2 %
Brady Statistic AS VS Percent: 1 %
Date Time Interrogation Session: 20150629060019
HighPow Impedance: 40 Ohm
Implantable Pulse Generator Model: 3257
Implantable Pulse Generator Serial Number: 7032457
Lead Channel Pacing Threshold Amplitude: 0.5 V
Lead Channel Pacing Threshold Amplitude: 1.5 V
Lead Channel Pacing Threshold Pulse Width: 0.5 ms
Lead Channel Pacing Threshold Pulse Width: 0.5 ms
Lead Channel Pacing Threshold Pulse Width: 0.5 ms
Lead Channel Sensing Intrinsic Amplitude: 1.6 mV
Lead Channel Setting Pacing Amplitude: 2 V
Lead Channel Setting Pacing Amplitude: 2.5 V
Lead Channel Setting Pacing Pulse Width: 0.5 ms
Lead Channel Setting Pacing Pulse Width: 0.5 ms
MDC IDC MSMT BATTERY VOLTAGE: 2.95 V
MDC IDC MSMT LEADCHNL LV IMPEDANCE VALUE: 480 Ohm
MDC IDC MSMT LEADCHNL RA IMPEDANCE VALUE: 390 Ohm
MDC IDC MSMT LEADCHNL RV IMPEDANCE VALUE: 330 Ohm
MDC IDC MSMT LEADCHNL RV PACING THRESHOLD AMPLITUDE: 0.875 V
MDC IDC MSMT LEADCHNL RV SENSING INTR AMPL: 12 mV
MDC IDC SET LEADCHNL LV PACING AMPLITUDE: 2 V
MDC IDC SET LEADCHNL RV SENSING SENSITIVITY: 0.5 mV
MDC IDC STAT BRADY AP VS PERCENT: 1 %
MDC IDC STAT BRADY RA PERCENT PACED: 83 %
Zone Setting Detection Interval: 260 ms
Zone Setting Detection Interval: 300 ms
Zone Setting Detection Interval: 350 ms

## 2013-07-10 NOTE — Progress Notes (Signed)
Remote ICD transmission.   

## 2013-07-17 ENCOUNTER — Other Ambulatory Visit: Payer: Self-pay | Admitting: Internal Medicine

## 2013-07-20 ENCOUNTER — Encounter: Payer: Self-pay | Admitting: Internal Medicine

## 2013-07-26 ENCOUNTER — Encounter: Payer: Self-pay | Admitting: Cardiology

## 2013-07-31 ENCOUNTER — Ambulatory Visit: Payer: Medicare Other

## 2013-08-03 ENCOUNTER — Ambulatory Visit (INDEPENDENT_AMBULATORY_CARE_PROVIDER_SITE_OTHER): Payer: Medicare Other | Admitting: General Practice

## 2013-08-03 DIAGNOSIS — I4891 Unspecified atrial fibrillation: Secondary | ICD-10-CM

## 2013-08-03 DIAGNOSIS — Z5181 Encounter for therapeutic drug level monitoring: Secondary | ICD-10-CM

## 2013-08-03 LAB — POCT INR: INR: 3

## 2013-08-03 NOTE — Progress Notes (Signed)
Pre visit review using our clinic review tool, if applicable. No additional management support is needed unless otherwise documented below in the visit note. 

## 2013-08-28 ENCOUNTER — Ambulatory Visit: Payer: Medicare Other | Admitting: Internal Medicine

## 2013-08-29 ENCOUNTER — Telehealth: Payer: Self-pay | Admitting: Internal Medicine

## 2013-08-29 NOTE — Telephone Encounter (Signed)
New message    Patient calling c/o irregular heart beat x 2 days.

## 2013-08-30 NOTE — Telephone Encounter (Signed)
Pt tells me that she thinks she has been irregular since Monday, but felt better after 2pm yesterday. She states she feels better today and has made her bed, went to mailbox, pulled some weeds, and going to get her hair done.  Pt advised to send in manual transmission and we will contact her if necessary about findings. She is also agreeable to calling if symptoms return.

## 2013-09-04 ENCOUNTER — Other Ambulatory Visit: Payer: Self-pay | Admitting: Internal Medicine

## 2013-09-11 ENCOUNTER — Telehealth: Payer: Self-pay | Admitting: Internal Medicine

## 2013-09-11 ENCOUNTER — Encounter: Payer: Self-pay | Admitting: Internal Medicine

## 2013-09-11 NOTE — Telephone Encounter (Signed)
Spoke with pt, she has been out of rhythm since Thursday she thinks. She is going to send a transmission so we can make sure of the rhythm. Her pulse this time is 89. She is fatigued and gets SOB with walking extended distance but no SOB with ADL's or walking in the home. She has not missed any doses of medicine. She has a f/u with dr Caryl Comes the first of oct. She is going out of town the end of sept and wants to make sure nothing else needs to be done prior to follow up Aware dr Caryl Comes is not in the office today, will forward for his review. Pt agreed with this plan.

## 2013-09-11 NOTE — Telephone Encounter (Signed)
°  Patient feel that she is out of rhythm. Please call and advise.

## 2013-09-12 NOTE — Telephone Encounter (Signed)
Advised pt Dr. Caryl Comes reviewed transmission, which shows her out of rhythm since yesterday. Per Caryl Comes request pt will send another transmission tomorrow and we will determine plan after results.  She is agreeable to this.

## 2013-09-14 ENCOUNTER — Ambulatory Visit (INDEPENDENT_AMBULATORY_CARE_PROVIDER_SITE_OTHER): Payer: Medicare Other | Admitting: Family

## 2013-09-14 ENCOUNTER — Ambulatory Visit: Payer: Medicare Other

## 2013-09-14 ENCOUNTER — Encounter: Payer: Self-pay | Admitting: Internal Medicine

## 2013-09-14 DIAGNOSIS — Z5181 Encounter for therapeutic drug level monitoring: Secondary | ICD-10-CM

## 2013-09-14 DIAGNOSIS — I482 Chronic atrial fibrillation, unspecified: Secondary | ICD-10-CM

## 2013-09-14 DIAGNOSIS — I4891 Unspecified atrial fibrillation: Secondary | ICD-10-CM

## 2013-09-14 LAB — POCT INR: INR: 2.4

## 2013-09-14 NOTE — Patient Instructions (Signed)
Continue to take 1/2 tablet all days except take 1 tablet on M/F.  Re-check on 6 weeks.  Anticoagulation Dose Instructions as of 09/14/2013     Dorene Grebe Tue Wed Thu Fri Sat   New Dose 2.5 mg 5 mg 2.5 mg 2.5 mg 2.5 mg 5 mg 2.5 mg    Description       Continue to take 1/2 tablet all days except take 1 tablet on M/F.  Re-check on 6 weeks.

## 2013-09-15 NOTE — Telephone Encounter (Signed)
After review by Dr. Caryl Comes - patient advised back in rhythm. She will call if symptoms reoccur.

## 2013-10-02 ENCOUNTER — Ambulatory Visit (INDEPENDENT_AMBULATORY_CARE_PROVIDER_SITE_OTHER): Payer: Medicare Other | Admitting: Family Medicine

## 2013-10-02 ENCOUNTER — Encounter: Payer: Self-pay | Admitting: Family Medicine

## 2013-10-02 VITALS — BP 102/72 | HR 64 | Temp 97.6°F | Wt 150.0 lb

## 2013-10-02 DIAGNOSIS — R059 Cough, unspecified: Secondary | ICD-10-CM

## 2013-10-02 DIAGNOSIS — R05 Cough: Secondary | ICD-10-CM

## 2013-10-02 MED ORDER — ALBUTEROL SULFATE HFA 108 (90 BASE) MCG/ACT IN AERS: 2.0000 | INHALATION_SPRAY | Freq: Four times a day (QID) | RESPIRATORY_TRACT | Status: DC | PRN

## 2013-10-02 NOTE — Patient Instructions (Signed)
Bronchitis  Unfortunately can last up to a month  antibiotics typically didnt help and didn't in your case  When i see you back, could try prednisone if needed  For wheezing and cough, can try albuterol inhaler  Bruise on side/? Costochondritis  Please ice area 2-3 x a day for 15 minutes  Seek care if you get shortness of breath or chest pain or fevers, otherwise, we can check in October at your visit.   Thanks, Dr. Yong Channel  Health Maintenance Due  Topic Date Due  . Zostavax  02/03/1994  . Pneumococcal Polysaccharide Vaccine Age 9 And Over  02/04/1999  . Influenza Vaccine  08/12/2013
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
## 2013-10-04 ENCOUNTER — Ambulatory Visit (INDEPENDENT_AMBULATORY_CARE_PROVIDER_SITE_OTHER): Payer: Medicare Other | Admitting: Family Medicine

## 2013-10-04 ENCOUNTER — Encounter: Payer: Self-pay | Admitting: Family Medicine

## 2013-10-04 VITALS — BP 112/80 | Temp 98.0°F

## 2013-10-04 DIAGNOSIS — N183 Chronic kidney disease, stage 3 unspecified: Secondary | ICD-10-CM

## 2013-10-04 DIAGNOSIS — B029 Zoster without complications: Secondary | ICD-10-CM

## 2013-10-04 MED ORDER — VALACYCLOVIR HCL 1 G PO TABS: 1000.0000 mg | ORAL_TABLET | Freq: Two times a day (BID) | ORAL | Status: DC

## 2013-10-04 NOTE — Patient Instructions (Signed)
This very well could be shingles. Since you just started with bumps yesterday we will treat you. Take valtrex for 7 days. Medicine can cause confusion so make sure your girlfriend looks out for you but you can still go on the trip.
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
## 2013-10-05 ENCOUNTER — Telehealth: Payer: Self-pay | Admitting: Internal Medicine

## 2013-10-05 MED ORDER — FAMCICLOVIR 500 MG PO TABS: 500.0000 mg | ORAL_TABLET | Freq: Every day | ORAL | Status: DC

## 2013-10-05 NOTE — Telephone Encounter (Signed)
Pt notified and stated she will like to take it now incase it starts spreading, medication sent into [facility] in [place]

## 2013-10-05 NOTE — Telephone Encounter (Signed)
Pt said the rx valacy valacyclovir 1000mg  is making her sick. Would like to know what else she can take.  She said no generic   Pharmacy [facility]

## 2013-10-05 NOTE — Telephone Encounter (Signed)
Pt states she stopped taking the Valtrex b/c it put her in A-fib and gave her a headache. She states the spot has not spread or burned or caused any itching and she would rather not take anything. She would like to if it would be ok for her to stay of meds. Please advise.

## 2013-10-05 NOTE — Telephone Encounter (Signed)
Dr. Hunter please see below 

## 2013-10-05 NOTE — Telephone Encounter (Signed)
Yes that is fine. Diagnosis was not clear.   If she is having issues with a fib, she needs to call her cardiologist

## 2013-10-05 NOTE — Telephone Encounter (Signed)
i sent in famciclovir. This class of medicine in general are older medications-there is no generic for this. The other option available she would have to take 5x a day so I prefer this medication.   Stop taking the valtrex.

## 2013-10-16 ENCOUNTER — Ambulatory Visit (INDEPENDENT_AMBULATORY_CARE_PROVIDER_SITE_OTHER): Payer: Medicare Other | Admitting: Family Medicine

## 2013-10-16 ENCOUNTER — Encounter: Payer: Self-pay | Admitting: Family Medicine

## 2013-10-16 VITALS — BP 100/64 | HR 60 | Temp 98.1°F | Wt 152.0 lb

## 2013-10-16 DIAGNOSIS — I421 Obstructive hypertrophic cardiomyopathy: Secondary | ICD-10-CM

## 2013-10-16 DIAGNOSIS — E038 Other specified hypothyroidism: Secondary | ICD-10-CM

## 2013-10-16 DIAGNOSIS — E785 Hyperlipidemia, unspecified: Secondary | ICD-10-CM

## 2013-10-16 DIAGNOSIS — I10 Essential (primary) hypertension: Secondary | ICD-10-CM

## 2013-10-16 DIAGNOSIS — Z23 Encounter for immunization: Secondary | ICD-10-CM

## 2013-10-16 DIAGNOSIS — E034 Atrophy of thyroid (acquired): Secondary | ICD-10-CM

## 2013-10-16 DIAGNOSIS — M109 Gout, unspecified: Secondary | ICD-10-CM | POA: Insufficient documentation

## 2013-10-16 LAB — CBC
HCT: 38.1 % (ref 36.0–46.0)
Hemoglobin: 12.4 g/dL (ref 12.0–15.0)
MCHC: 32.4 g/dL (ref 30.0–36.0)
MCV: 87.8 fl (ref 78.0–100.0)
PLATELETS: 182 10*3/uL (ref 150.0–400.0)
RBC: 4.34 Mil/uL (ref 3.87–5.11)
RDW: 16.8 % — ABNORMAL HIGH (ref 11.5–15.5)
WBC: 7.3 10*3/uL (ref 4.0–10.5)

## 2013-10-16 LAB — COMPREHENSIVE METABOLIC PANEL
ALBUMIN: 4 g/dL (ref 3.5–5.2)
ALT: 25 U/L (ref 0–35)
AST: 35 U/L (ref 0–37)
Alkaline Phosphatase: 55 U/L (ref 39–117)
BUN: 32 mg/dL — ABNORMAL HIGH (ref 6–23)
CALCIUM: 9.3 mg/dL (ref 8.4–10.5)
CHLORIDE: 103 meq/L (ref 96–112)
CO2: 25 mEq/L (ref 19–32)
Creatinine, Ser: 1.7 mg/dL — ABNORMAL HIGH (ref 0.4–1.2)
GFR: 29.95 mL/min — ABNORMAL LOW (ref >60.00)
Glucose, Bld: 92 mg/dL (ref 70–99)
POTASSIUM: 3.5 meq/L (ref 3.5–5.1)
Sodium: 141 mEq/L (ref 135–145)
TOTAL PROTEIN: 7 g/dL (ref 6.0–8.3)
Total Bilirubin: 0.9 mg/dL (ref 0.2–1.2)

## 2013-10-16 NOTE — Patient Instructions (Signed)
Great to get to know you a little better!   I hope you get a good report at cardiology this week. It does sound like you are going in and out of rhythm.   See me in 4 months  Labs today

## 2013-10-16 NOTE — Assessment & Plan Note (Signed)
Poor control. No clear benefit to zetia alone for morbidity/mortality but patient unwilling to try statin due to prior failures. Check LDL and offer once weekly therapy again potentially.

## 2013-10-16 NOTE — Assessment & Plan Note (Signed)
Check TSH today, if over treated, could be predisposing to patient going into atrial fibrillation. Previously over treated. For present time, continue synthroid 170mcg.

## 2013-10-16 NOTE — Progress Notes (Signed)
Sandra Reddish, MD Phone: [phone]  Subjective:  Patient presents today to establish care with me as their new primary care provider. Patient was formerly a patient of [name]. Chief complaint-noted.   Hypothyroidism-previously overtreated  Lab Results  Component Value Date   TSH 0.22* 02/23/2012  On thyroid medication-yes synthroid 130mcg. Patient does describe going into and out of atrial fibrillation several times over last week but feels well at present.  ROS-No hair or nail changes. No heat/cold intolerance. No constipation or diarrhea. Denies shakiness.   Hyperlipidemia-poor control  Lab Results  Component Value Date   LDLCALC 124* 06/24/2009  On statin: no, zetia alone due to intolerance to statins (tried 5). Daughter got sick with once weekly and patient not willing to try.  ROS- no chest pain or shortness of breath. No myalgias  Hypertension-well controlled  BP Readings from Last 3 Encounters:  10/16/13 100/64  10/04/13 112/80  10/02/13 102/72  Compliant with medications-yes without side effects ROS-Denies any CP, HA, SOB, blurry vision. .   The following were reviewed and entered/updated in epic: Past Medical History  Diagnosis Date  . Atrial fibrillation     on Tikosyn and Coumadin  . Hyperlipidemia   . Hypothyroidism   . Bradycardia   . Cardiomyopathy, hypertrophic nonobstructive     ejection fraction 25%  . Anal fissure   . Adenomatous colon polyp   . Diverticulosis   . ICD-CRT     generator change 2013  . Systolic heart failure   . History of colonoscopy   . Migraines   . Hyperlipidemia   . Hypertension    Patient Active Problem List   Diagnosis Date Noted  . Chronic systolic heart failure 91/66/0600    Priority: High  . Biventricular implantable cardioverter-defibrillator -St. Jude 05/15/2010    Priority: High  . Hypertrophic obstructive cardiomyopathy 02/08/2007    Priority: High  . Atrial fibrillation 07/08/2006    Priority: High  .  Essential hypertension, benign 10/16/2013    Priority: Medium  . Gout 10/16/2013    Priority: Medium  . Anxiety 03/31/2010    Priority: Medium  . Chronic kidney disease, stage III (moderate) 12/30/2009    Priority: Medium  . Hypothyroidism 07/08/2006    Priority: Medium  . Hyperlipidemia 07/08/2006    Priority: Medium  . Encounter for therapeutic drug monitoring 02/13/2013    Priority: Low  . Dyspnea 12/07/2011    Priority: Low  . RIATA Lead 03/23/2011    Priority: Low  . History of colonic polyps 09/10/2008    Priority: Low  . Fibrocystic breast disease 09/17/1991    Priority: Low   Past Surgical History  Procedure Laterality Date  . Cardioversion    . Cardiac defibrillator placement  2007    pacemaker  . Vaginal hysterectomy      uterus alone  . Ovarian cyst removal      1989-benign    Family History  Problem Relation Age of Onset  . Heart disease Mother   . Cancer Father     colon  . COPD Father   . Heart disease Father   . Cardiomyopathy Daughter   . Cancer Paternal Aunt     BREAST    Medications- reviewed and updated Current Outpatient Prescriptions  Medication Sig Dispense Refill  . benazepril (LOTENSIN) 10 MG tablet TAKE ONE TABLET BY MOUTH TWICE DAILY  180 tablet  0  . carvedilol (COREG) 12.5 MG tablet Take 1 tablet (12.5 mg total) by mouth 2 (two)  times daily with a meal.      . clorazepate (TRANXENE) 7.5 MG tablet TAKE ONE TABLET BY MOUTH TWICE DAILY AS NEEDED  60 tablet  0  . dofetilide (TIKOSYN) 125 MCG capsule Take 1 capsule (125 mcg total) by mouth 2 (two) times daily. Takes with 239mcg cpasule to = 360mcg/dose  180 capsule  3  . ezetimibe (ZETIA) 10 MG tablet Take 10 mg by mouth daily.      . furosemide (LASIX) 40 MG tablet TAKE ONE TABLET BY MOUTH TWICE DAILY  180 tablet  3  . magnesium oxide (MAG-OX) 400 MG tablet TAKE ONE TABLET BY MOUTH ONCE DAILY  90 tablet  3  . SYNTHROID 100 MCG tablet TAKE ONE TABLET BY MOUTH ONCE DAILY  90 tablet  0  .  vitamin E 400 UNIT capsule Take 400 Units by mouth every other day.       . warfarin (COUMADIN) 5 MG tablet TAKE AS DIRECTED  90 tablet  0  . albuterol (PROVENTIL HFA;VENTOLIN HFA) 108 (90 BASE) MCG/ACT inhaler Inhale 2 puffs into the lungs every 6 (six) hours as needed for wheezing or shortness of breath.  1 Inhaler  0  . fluticasone (FLONASE) 50 MCG/ACT nasal spray Place 2 sprays into both nostrils daily.  16 g  1   No current facility-administered medications for this visit.    Allergies-reviewed and updated Allergies  Allergen Reactions  . Procaine Hcl     REACTION: smothers  . Amoxicillin     Rash   . Codeine     REACTION: nausea  . Levofloxacin     Due to cardiac  AF  . Niacin     REACTION: rash  . Ramipril     REACTION: unspecified  . Statins     REACTION: muscle aches    History   Social History  . Marital Status: Married    Spouse Name: N/A    Number of Children: 3  . Years of Education: N/A   Occupational History  . Retired     Probation officer   Social History Main Topics  . Smoking status: Former Smoker -- 0.20 packs/day for 19 years    Types: Cigarettes    Quit date: 01/13/1987  . Smokeless tobacco: Never Used  . Alcohol Use: No  . Drug Use: No  . Sexual Activity: Not Currently   Other Topics Concern  . None   Social History Narrative   Married 1953 (husband Sandra [name] in our Network engineer). 3 girls (1 is a patient here-Sandra [name]). 2 grandkids.       Retired in [place]: very active, going to church and church friends, movies    ROS--See HPI   Objective: BP 100/64  Pulse 60  Temp(Src) 98.1 F (36.7 C)  Wt 152 lb (68.947 kg) Gen: NAD, resting comfortably, energetic HEENT: Mucous membranes are moist.  Neck: no thyromegaly CV: RRR no murmurs rubs or gallops (not in a fib) Lungs: CTAB no crackles, wheeze, rhonchi Abdomen: soft/nontender/nondistended/normal bowel sounds.  Ext: trace edema Skin: warm, dry Neuro: grossly  normal, moves all extremities, PERRLA  Assessment/Plan:  Hypothyroidism Check TSH today, if over treated, could be predisposing to patient going into atrial fibrillation. Previously over treated. For present time, continue synthroid 151mcg.   Hyperlipidemia Poor control. No clear benefit to zetia alone for morbidity/mortality but patient unwilling to try statin due to prior failures. Check LDL and offer once weekly therapy again potentially.  Essential hypertension, benign Well controlled on benazepril and coreg. Bmet today due to history chronic kidney disease stage III. Continue current medicine   nonfasting Orders Placed This Encounter  Procedures  . CBC    [place]  . Comprehensive metabolic panel    [place]  . TSH    [place]  . LDL cholesterol, direct    [place]

## 2013-10-16 NOTE — Assessment & Plan Note (Addendum)
Well controlled on benazepril and coreg. Bmet today due to history chronic kidney disease stage III. Continue current medicine

## 2013-10-17 ENCOUNTER — Telehealth: Payer: Self-pay

## 2013-10-17 ENCOUNTER — Other Ambulatory Visit: Payer: Self-pay | Admitting: Internal Medicine

## 2013-10-17 LAB — TSH: TSH: 0.41 u[IU]/mL (ref 0.35–4.50)

## 2013-10-17 LAB — LDL CHOLESTEROL, DIRECT: Direct LDL: 124.9 mg/dL

## 2013-10-17 NOTE — Telephone Encounter (Signed)
Sandra [name], can you please call pt to see if she wants her flu shot and if so schedule her in my injection slot or flu clinic per Dr. Yong Channel, Dr. Yong Channel forgot to offer it to her at her visit yesterday.

## 2013-10-18 ENCOUNTER — Encounter: Payer: Self-pay | Admitting: Family Medicine

## 2013-10-19 ENCOUNTER — Ambulatory Visit (INDEPENDENT_AMBULATORY_CARE_PROVIDER_SITE_OTHER): Payer: Medicare Other | Admitting: Internal Medicine

## 2013-10-19 ENCOUNTER — Other Ambulatory Visit: Payer: Self-pay

## 2013-10-19 ENCOUNTER — Encounter: Payer: Self-pay | Admitting: Internal Medicine

## 2013-10-19 VITALS — BP 132/80 | HR 82 | Ht 63.0 in | Wt 149.2 lb

## 2013-10-19 DIAGNOSIS — Z95810 Presence of automatic (implantable) cardiac defibrillator: Secondary | ICD-10-CM

## 2013-10-19 DIAGNOSIS — I5022 Chronic systolic (congestive) heart failure: Secondary | ICD-10-CM

## 2013-10-19 DIAGNOSIS — I480 Paroxysmal atrial fibrillation: Secondary | ICD-10-CM

## 2013-10-19 DIAGNOSIS — I422 Other hypertrophic cardiomyopathy: Secondary | ICD-10-CM

## 2013-10-19 LAB — MDC_IDC_ENUM_SESS_TYPE_INCLINIC
Brady Statistic RV Percent Paced: 94 %
HighPow Impedance: 38.3744
Implantable Pulse Generator Model: 3257
Lead Channel Impedance Value: 387.5 Ohm
Lead Channel Impedance Value: 450 Ohm
Lead Channel Pacing Threshold Amplitude: 0.75 V
Lead Channel Pacing Threshold Amplitude: 0.75 V
Lead Channel Pacing Threshold Amplitude: 1.375 V
Lead Channel Pacing Threshold Pulse Width: 0.5 ms
Lead Channel Pacing Threshold Pulse Width: 0.5 ms
Lead Channel Pacing Threshold Pulse Width: 0.5 ms
Lead Channel Pacing Threshold Pulse Width: 0.5 ms
Lead Channel Sensing Intrinsic Amplitude: 1 mV
Lead Channel Setting Pacing Amplitude: 2 V
Lead Channel Setting Pacing Amplitude: 2.375
Lead Channel Setting Pacing Pulse Width: 0.5 ms
Lead Channel Setting Pacing Pulse Width: 0.5 ms
Lead Channel Setting Sensing Sensitivity: 0.5 mV
MDC IDC MSMT BATTERY REMAINING LONGEVITY: 48 mo
MDC IDC MSMT LEADCHNL RV IMPEDANCE VALUE: 350 Ohm
MDC IDC MSMT LEADCHNL RV PACING THRESHOLD AMPLITUDE: 0.875 V
MDC IDC MSMT LEADCHNL RV SENSING INTR AMPL: 11.6 mV
MDC IDC PG SERIAL: 7032457
MDC IDC SESS DTM: 20151008124945
MDC IDC SET LEADCHNL RV PACING AMPLITUDE: 2 V
MDC IDC SET ZONE DETECTION INTERVAL: 260 ms
MDC IDC STAT BRADY RA PERCENT PACED: 77 %
Zone Setting Detection Interval: 300 ms
Zone Setting Detection Interval: 350 ms

## 2013-10-19 MED ORDER — SPIRONOLACTONE 25 MG PO TABS: 12.5000 mg | ORAL_TABLET | Freq: Every day | ORAL | Status: DC

## 2013-10-19 MED ORDER — CARVEDILOL 12.5 MG PO TABS: 12.5000 mg | ORAL_TABLET | Freq: Two times a day (BID) | ORAL | Status: DC

## 2013-10-19 NOTE — Progress Notes (Signed)
Patient Care Team: Marin Olp, MD as PCP - General (Family Medicine) Haywood Lasso, MD (General Surgery)   HPI  Sandra [name] is a 80 y.o. female seen in followup for congestive heart failure in the setting of hypertrophic cardiomyopathy with depressed left ventricular function. She is status post CRT-D implantation.  She is on Coumadin. Because of recurrences of atrial fibrillation she is also a put on dofetilide    She is troubled with recurrent episodes of atrial fibrillation which have been quite symptomatic.    We have in the past discuss amiodarone versus AV junction ablation. She has been reluctant to consider amiodarone because of the side effects.    She comes in today with complaints of shortness of breath that is getting worse. She had some problems with peripheral edema at which point she adjust her diuretics on her but not in appropriately.  She feels some better that she had. She is quite short of breath when she is in atrial fibrillation.    Last echocardiogram 30% 10/13 followed by AV optimization 2/14  Past Medical History  Diagnosis Date  . Atrial fibrillation     on Tikosyn and Coumadin  . Hyperlipidemia   . Hypothyroidism   . Bradycardia   . Cardiomyopathy, hypertrophic nonobstructive     ejection fraction 25%  . Anal fissure   . Adenomatous colon polyp   . Diverticulosis   . ICD-CRT     generator change 2013  . Systolic heart failure   . History of colonoscopy   . Migraines   . Hyperlipidemia   . Hypertension     Past Surgical History  Procedure Laterality Date  . Cardioversion    . Cardiac defibrillator placement  2007    pacemaker  . Vaginal hysterectomy      uterus alone  . Ovarian cyst removal      1989-benign    Current Outpatient Prescriptions  Medication Sig Dispense Refill  . albuterol (PROVENTIL HFA;VENTOLIN HFA) 108 (90 BASE) MCG/ACT inhaler Inhale 2 puffs into the lungs every 6 (six) hours as needed for  wheezing or shortness of breath.  1 Inhaler  0  . benazepril (LOTENSIN) 10 MG tablet TAKE ONE TABLET BY MOUTH TWICE DAILY  180 tablet  1  . carvedilol (COREG) 12.5 MG tablet Take 1 tablet (12.5 mg total) by mouth 2 (two) times daily with a meal.  60 tablet  0  . clorazepate (TRANXENE) 7.5 MG tablet TAKE ONE TABLET BY MOUTH TWICE DAILY AS NEEDED  60 tablet  0  . dofetilide (TIKOSYN) 125 MCG capsule Take 1 capsule (125 mcg total) by mouth 2 (two) times daily. Takes with 228mcg cpasule to = 39mcg/dose  180 capsule  3  . ezetimibe (ZETIA) 10 MG tablet Take 10 mg by mouth daily.      . fluticasone (FLONASE) 50 MCG/ACT nasal spray Place 2 sprays into both nostrils daily.  16 g  1  . furosemide (LASIX) 40 MG tablet TAKE ONE TABLET BY MOUTH TWICE DAILY  180 tablet  3  . magnesium oxide (MAG-OX) 400 MG tablet TAKE ONE TABLET BY MOUTH ONCE DAILY  90 tablet  3  . SYNTHROID 100 MCG tablet TAKE ONE TABLET BY MOUTH ONCE DAILY  90 tablet  0  . vitamin E 400 UNIT capsule Take 400 Units by mouth every other day.       . warfarin (COUMADIN) 5 MG tablet TAKE AS DIRECTED  90 tablet  0   No current facility-administered medications for this visit.    Allergies  Allergen Reactions  . Levofloxacin     Due to cardiac  AF  . Procaine Hcl     REACTION: smothers  . Amoxicillin     Rash   . Codeine     REACTION: nausea  . Niacin     REACTION: rash  . Ramipril     REACTION: unspecified  . Statins     REACTION: muscle aches    Review of Systems negative except from HPI and PMH  Physical Exam BP 132/80  Pulse 82  Ht 5\' 3"  (1.6 m)  Wt 149 lb 4 oz (67.699 kg)  BMI 26.44 kg/m2 Well developed and well nourished in no acute distress HENT normal E scleral and icterus clear Neck Supple JVP  greater than 10 cm  carotids brisk and full Clear to ausculation Device pocket well healed; without hematoma or erythema.  There is no tethering   Regular rate and rhythm, no murmurs gallops or rub Soft with active  bowel sounds No clubbing cyanosis  trace  Edema Alert and oriented, grossly normal motor and sensory function Skin Warm and Dry  ECG demonstrates AV pacing Interval 24/17/51 Axis leftward Assessment and  Plan  Hypertrophic cardiomyopathy  Left ventricular dysfunction  Implantable defibrillator-CRT-St. Jude  The patient's device was interrogated.  The information was reviewed. The AV delay was shortened on a paced AV 250--200  Atrial fibrillation -on dofetilide  Congestive heart failure-chronic-systolic   there is evidence of significant right-sided volume overload. For now we will plan to Aldactone and check potassium levels in one week in 3 weeks. We'll undertake a repeat echo to assess the LV function and LA size. This may help with her decision process regarding ablation of the AV junction in the pulmonary veins.  We'll reassess her in about 8 weeks.

## 2013-10-19 NOTE — Patient Instructions (Signed)
Your physician has recommended you make the following change in your medication:  1) START Aldactone 12.5 mg daily  Your physician recommends that you return for lab work in: 1 week for BMET  Your physician recommends that you return for lab work in: 3 weeks for BMET  Your physician has requested that you have an echocardiogram. Echocardiography is a painless test that uses sound waves to create images of your heart. It provides your doctor with information about the size and shape of your heart and how well your heart's chambers and valves are working. This procedure takes approximately one hour. There are no restrictions for this procedure.  Your physician recommends that you schedule a follow-up appointment in: 8 weeks with Dr. Caryl Comes.

## 2013-10-26 ENCOUNTER — Ambulatory Visit: Payer: Medicare Other

## 2013-10-27 ENCOUNTER — Other Ambulatory Visit (INDEPENDENT_AMBULATORY_CARE_PROVIDER_SITE_OTHER): Payer: Medicare Other | Admitting: *Deleted

## 2013-10-27 ENCOUNTER — Ambulatory Visit (HOSPITAL_COMMUNITY): Payer: Medicare Other | Attending: Cardiology | Admitting: Radiology

## 2013-10-27 DIAGNOSIS — I422 Other hypertrophic cardiomyopathy: Secondary | ICD-10-CM

## 2013-10-27 DIAGNOSIS — R0600 Dyspnea, unspecified: Secondary | ICD-10-CM

## 2013-10-27 DIAGNOSIS — I480 Paroxysmal atrial fibrillation: Secondary | ICD-10-CM

## 2013-10-27 DIAGNOSIS — I5022 Chronic systolic (congestive) heart failure: Secondary | ICD-10-CM

## 2013-10-27 DIAGNOSIS — I4891 Unspecified atrial fibrillation: Secondary | ICD-10-CM

## 2013-10-27 LAB — BASIC METABOLIC PANEL
BUN: 32 mg/dL — AB (ref 6–23)
CALCIUM: 9.6 mg/dL (ref 8.4–10.5)
CO2: 22 mEq/L (ref 19–32)
Chloride: 105 mEq/L (ref 96–112)
Creatinine, Ser: 1.7 mg/dL — ABNORMAL HIGH (ref 0.4–1.2)
GFR: 30.76 mL/min — AB (ref >60.00)
GLUCOSE: 86 mg/dL (ref 70–99)
Potassium: 4.1 mEq/L (ref 3.5–5.1)
Sodium: 142 mEq/L (ref 135–145)

## 2013-10-27 NOTE — Progress Notes (Signed)
Echocardiogram performed.  

## 2013-11-02 ENCOUNTER — Ambulatory Visit (INDEPENDENT_AMBULATORY_CARE_PROVIDER_SITE_OTHER): Payer: Medicare Other | Admitting: Family

## 2013-11-02 DIAGNOSIS — Z5181 Encounter for therapeutic drug level monitoring: Secondary | ICD-10-CM

## 2013-11-02 LAB — POCT INR: INR: 2

## 2013-11-02 NOTE — Patient Instructions (Signed)
Continue to take 1/2 tablet all days except take 1 tablet on M/F.  Re-check on 6 weeks. Anticoagulation Dose Instructions as of 11/02/2013     Sandra [name] Tue Wed Thu Fri Sat   New Dose 2.5 mg 5 mg 2.5 mg 2.5 mg 2.5 mg 5 mg 2.5 mg    Description       Continue to take 1/2 tablet all days except take 1 tablet on M/F.  Re-check on 6 weeks.

## 2013-11-07 ENCOUNTER — Telehealth: Payer: Self-pay | Admitting: Family Medicine

## 2013-11-07 MED ORDER — SYNTHROID 100 MCG PO TABS: 100.0000 ug | ORAL_TABLET | Freq: Every day | ORAL | Status: DC

## 2013-11-07 MED ORDER — HYDROCORTISONE ACE-PRAMOXINE 1-1 % RE FOAM: 1.0000 | Freq: Two times a day (BID) | RECTAL | Status: DC

## 2013-11-07 MED ORDER — EZETIMIBE 10 MG PO TABS: 10.0000 mg | ORAL_TABLET | Freq: Every day | ORAL | Status: DC

## 2013-11-07 NOTE — Telephone Encounter (Signed)
yes

## 2013-11-07 NOTE — Telephone Encounter (Signed)
[facility] [place],  - [id number] Is requesting re-fill on PROCTOSOL HC 2.5 % rectal cream

## 2013-11-07 NOTE — Telephone Encounter (Signed)
zetia and Synthroid refilled. Pt was on Proctosol in the past, is it ok to refill?

## 2013-11-07 NOTE — Telephone Encounter (Signed)
Proctosol sent in 

## 2013-11-07 NOTE — Telephone Encounter (Signed)
Please add ezetimibe (ZETIA) 10 MG tablet and SYNTHROID 100 MCG tablet to the below re-fill request

## 2013-11-10 ENCOUNTER — Other Ambulatory Visit (INDEPENDENT_AMBULATORY_CARE_PROVIDER_SITE_OTHER): Payer: Medicare Other | Admitting: *Deleted

## 2013-11-10 DIAGNOSIS — I480 Paroxysmal atrial fibrillation: Secondary | ICD-10-CM

## 2013-11-10 DIAGNOSIS — I5022 Chronic systolic (congestive) heart failure: Secondary | ICD-10-CM

## 2013-11-10 LAB — BASIC METABOLIC PANEL
BUN: 31 mg/dL — AB (ref 6–23)
CO2: 26 mEq/L (ref 19–32)
CREATININE: 1.6 mg/dL — AB (ref 0.4–1.2)
Calcium: 9.9 mg/dL (ref 8.4–10.5)
Chloride: 104 mEq/L (ref 96–112)
GFR: 34.21 mL/min — AB (ref >60.00)
Glucose, Bld: 70 mg/dL (ref 70–99)
Potassium: 4 mEq/L (ref 3.5–5.1)
Sodium: 139 mEq/L (ref 135–145)

## 2013-11-15 ENCOUNTER — Encounter: Payer: Self-pay | Admitting: Family Medicine

## 2013-11-15 ENCOUNTER — Encounter: Payer: Self-pay | Admitting: Internal Medicine

## 2013-11-15 ENCOUNTER — Ambulatory Visit (INDEPENDENT_AMBULATORY_CARE_PROVIDER_SITE_OTHER): Payer: Medicare Other | Admitting: Family Medicine

## 2013-11-15 VITALS — BP 122/72 | Temp 97.7°F | Wt 148.0 lb

## 2013-11-15 DIAGNOSIS — K5732 Diverticulitis of large intestine without perforation or abscess without bleeding: Secondary | ICD-10-CM

## 2013-11-15 MED ORDER — AMOXICILLIN-POT CLAVULANATE 875-125 MG PO TABS: 1.0000 | ORAL_TABLET | Freq: Two times a day (BID) | ORAL | Status: DC

## 2013-11-15 NOTE — Patient Instructions (Signed)
augmentin x 7 days. Come see me if you get a rash.    Diverticulitis Diverticulitis is inflammation or infection of small pouches in your colon that form when you have a condition called diverticulosis. The pouches in your colon are called diverticula. Your colon, or large intestine, is where water is absorbed and stool is formed. Complications of diverticulitis can include:  Bleeding.  Severe infection.  Severe pain.  Perforation of your colon.  Obstruction of your colon. CAUSES  Diverticulitis is caused by bacteria. Diverticulitis happens when stool becomes trapped in diverticula. This allows bacteria to grow in the diverticula, which can lead to inflammation and infection. RISK FACTORS People with diverticulosis are at risk for diverticulitis. Eating a diet that does not include enough fiber from fruits and vegetables may make diverticulitis more likely to develop. SYMPTOMS  Symptoms of diverticulitis may include:  Abdominal pain and tenderness. The pain is normally located on the left side of the abdomen, but may occur in other areas.  Fever and chills.  Bloating.  Cramping.  Nausea.  Vomiting.  Constipation.  Diarrhea.  Blood in your stool. DIAGNOSIS  Your health care provider will ask you about your medical history and do a physical exam. You may need to have tests done because many medical conditions can cause the same symptoms as diverticulitis. Tests may include:  Blood tests.  Urine tests.  Imaging tests of the abdomen, including X-rays and CT scans. When your condition is under control, your health care provider may recommend that you have a colonoscopy. A colonoscopy can show how severe your diverticula are and whether something else is causing your symptoms. TREATMENT  Most cases of diverticulitis are mild and can be treated at home. Treatment may include:  Taking over-the-counter pain medicines.  Following a clear liquid diet.  Taking antibiotic  medicines by mouth for 7-10 days. More severe cases may be treated at a hospital. Treatment may include:  Not eating or drinking.  Taking prescription pain medicine.  Receiving antibiotic medicines through an IV tube.  Receiving fluids and nutrition through an IV tube.  Surgery. HOME CARE INSTRUCTIONS   Follow your health care provider's instructions carefully.  Follow a full liquid diet or other diet as directed by your health care provider. After your symptoms improve, your health care provider may tell you to change your diet. He or she may recommend you eat a high-fiber diet. Fruits and vegetables are good sources of fiber. Fiber makes it easier to pass stool.  Take fiber supplements or probiotics as directed by your health care provider.  Only take medicines as directed by your health care provider.  Keep all your follow-up appointments. SEEK MEDICAL CARE IF:   Your pain does not improve.  You have a hard time eating food.  Your bowel movements do not return to normal. SEEK IMMEDIATE MEDICAL CARE IF:   Your pain becomes worse.  Your symptoms do not get better.  Your symptoms suddenly get worse.  You have a fever.  You have repeated vomiting.  You have bloody or black, tarry stools. MAKE SURE YOU:   Understand these instructions.  Will watch your condition.  Will get help right away if you are not doing well or get worse. Document Released: 10/08/2004 Document Revised: 01/03/2013 Document Reviewed: 11/23/2012 [facility] Patient Information 2015 [place], [place]. This information is not intended to replace advice given to you by your health care provider. Make sure you discuss any questions you have with your health care  provider.  

## 2013-11-15 NOTE — Progress Notes (Signed)
Garret Reddish, MD Phone: [phone]  Subjective:   Sandra [name] is a 80 y.o. year old very pleasant female patient who presents with the following:  LLQ Abdominal Pain Started 2 days ago. Took tylenol with mild relief. Symptoms worsened. Took ibuprofen with mild relief. LLQ aching 6/10 abdominal pain. Feels similar to diverticulitis in the past. Known severe sigmoid diverticulitis. Patient states year since she had this. Still has her appendix. Pencil thin stool this AM but no blood in her stool.   ROS-no fever/chills. No nausea/vomiting. Slight decreased appetite. No dysuria. Polyuria since starting aldactone with no recent increase.   Past Medical History- Patient Active Problem List   Diagnosis Date Noted  . Chronic systolic heart failure 84/66/5993    Priority: High  . Biventricular implantable cardioverter-defibrillator -St. Jude 05/15/2010    Priority: High  . Hypertrophic obstructive cardiomyopathy 02/08/2007    Priority: High  . Atrial fibrillation 07/08/2006    Priority: High  . Essential hypertension, benign 10/16/2013    Priority: Medium  . Gout 10/16/2013    Priority: Medium  . Anxiety 03/31/2010    Priority: Medium  . Chronic kidney disease, stage III (moderate) 12/30/2009    Priority: Medium  . Hypothyroidism 07/08/2006    Priority: Medium  . Hyperlipidemia 07/08/2006    Priority: Medium  . Encounter for therapeutic drug monitoring 02/13/2013    Priority: Low  . Dyspnea 12/07/2011    Priority: Low  . RIATA Lead 03/23/2011    Priority: Low  . History of colonic polyps 09/10/2008    Priority: Low  . Fibrocystic breast disease 09/17/1991    Priority: Low   Medications- reviewed and updated Current Outpatient Prescriptions  Medication Sig Dispense Refill  . benazepril (LOTENSIN) 10 MG tablet TAKE ONE TABLET BY MOUTH TWICE DAILY 180 tablet 1  . carvedilol (COREG) 12.5 MG tablet Take 1 tablet (12.5 mg total) by mouth 2 (two) times daily with a meal.  60 tablet 0  . clorazepate (TRANXENE) 7.5 MG tablet TAKE ONE TABLET BY MOUTH TWICE DAILY AS NEEDED 60 tablet 0  . dofetilide (TIKOSYN) 125 MCG capsule Take 1 capsule (125 mcg total) by mouth 2 (two) times daily. Takes with 231mcg cpasule to = 359mcg/dose 180 capsule 3  . ezetimibe (ZETIA) 10 MG tablet Take 1 tablet (10 mg total) by mouth daily. 30 tablet 1  . furosemide (LASIX) 40 MG tablet TAKE ONE TABLET BY MOUTH TWICE DAILY 180 tablet 3  . magnesium oxide (MAG-OX) 400 MG tablet TAKE ONE TABLET BY MOUTH ONCE DAILY 90 tablet 3  . spironolactone (ALDACTONE) 25 MG tablet Take 0.5 tablets (12.5 mg total) by mouth daily. 30 tablet 3  . SYNTHROID 100 MCG tablet Take 1 tablet (100 mcg total) by mouth daily before breakfast. 90 tablet 0  . vitamin E 400 UNIT capsule Take 400 Units by mouth every other day.     . warfarin (COUMADIN) 5 MG tablet TAKE AS DIRECTED 90 tablet 0  . albuterol (PROVENTIL HFA;VENTOLIN HFA) 108 (90 BASE) MCG/ACT inhaler Inhale 2 puffs into the lungs every 6 (six) hours as needed for wheezing or shortness of breath. 1 Inhaler 0  . fluticasone (FLONASE) 50 MCG/ACT nasal spray Place 2 sprays into both nostrils daily. 16 g 1  . hydrocortisone-pramoxine (PROCTOFOAM HC) rectal foam Place 1 applicator rectally 2 (two) times daily. 10 g 0   No current facility-administered medications for this visit.    Objective: BP 122/72 mmHg  Temp(Src) 97.7 F (36.5 C)  Wt 148 lb (67.132 kg) Gen: NAD, resting comfortably in chair, in paint with certain movements.  CV: RRR no murmurs rubs or gallops Lungs: CTAB no crackles, wheeze, rhonchi Abdomen: soft. Moderate tenderness in LLQ. No rebound or guarding. Normal bowel sounds.  Ext: no edema Skin: warm, dry, no rash Neuro: grossly normal, moves all extremities   Assessment/Plan:  Diverticulitis Likely diverticulitis. Fortunately I think patient is caught this very early. Difficult situation with medication.Considered ciprofloxacin but the  patient has had what sounds like atrial fibrillation with RVR with levofloxacin and it is listed as an allergy. Likely could handle Flagyl. Patient does have a rash history with amoxicillin but ultimately we decided that a side effect of rash was less worrisome than worsening of atrial fibrillation. Will send Dr. Caryl Comes a message to let him know antibiotic choice. Patient does seem to have tolerated low-dose ciprofloxacin before 250 mg twice a day. And I will see if he thinks that's a reasonable backup if she ends up getting a rash on Augmentin.  Return precautions advised.   Meds ordered this encounter  Medications  . amoxicillin-clavulanate (AUGMENTIN) 875-125 MG per tablet    Sig: Take 1 tablet by mouth 2 (two) times daily.    Dispense:  14 tablet    Refill:  0

## 2013-11-16 ENCOUNTER — Ambulatory Visit: Payer: Medicare Other | Admitting: Family Medicine

## 2013-11-16 ENCOUNTER — Other Ambulatory Visit: Payer: Self-pay | Admitting: Internal Medicine

## 2013-12-01 ENCOUNTER — Telehealth: Payer: Self-pay | Admitting: Family Medicine

## 2013-12-01 NOTE — Telephone Encounter (Signed)
Pt states she is doing better and if she gets work she will go to urgent care

## 2013-12-01 NOTE — Telephone Encounter (Signed)
Bevelyn Ngo can you work with Derinda Late to put her on schedule for 4:15

## 2013-12-01 NOTE — Telephone Encounter (Signed)
Dr. Hunter please advise. 

## 2013-12-01 NOTE — Telephone Encounter (Signed)
Not clear this is diverticulitis with lack of resolution. Would recommend office visit. Can inform patient Monday-she already declined an appointment today with me and she knows to go to urgent care if worsens over weekend.

## 2013-12-01 NOTE — Telephone Encounter (Signed)
Please advise 

## 2013-12-01 NOTE — Telephone Encounter (Signed)
Pt states she thinks she would like to try another dose of a different antibiotic. Pt does not fill comfortable taking the augmentin again. She did break out w/ it.  Like a bad sunburn, especially lower body, groin and lower back.  Pt doesn't want to have to come in, but if another antibiotic doesn't work, she will be right here. [facility]

## 2013-12-01 NOTE — Telephone Encounter (Signed)
Patient Information:  [name] Name: Stevey  Phone: [phone]  Patient: Sandra [name], Sandra [name]  Gender: Female  DOB: Jun 07, 1932  Age: 80 Years  PCP: Garret Reddish  Office Follow Up:  Does the office need to follow up with this patient?: Yes  Instructions For The Office: Calling about constant lower abd pain that has come back but not as bad as it was when seen in the office and having more freq soft stools--feels like she has to have a BM all the time. Was seen in the office on 11/15/13 for lower abd pain--> dx with diverticulitis flair up and started on Augmentin. Took full course of Augmentin 125 mg BID x 7 days--had a red sunburn like rash all over body and severe itching from med but continued to take med until it was finished. Wants to know if something else can be called in because sxs are coming back. PLEASE ADVISE.  RN Note:  Attempted to schedule an appt for today but none were available with Dr.Hunter so pt didn't want to schedule with another doctor. Per caller request, will send a message to the office to follow-up with pt. Pt has several allergies to antibiotics. She wants med called to [facility] on [address] in [place].  Symptoms  Reason For Call & Symptoms: Calling about constant lower abd pain that has come back but not as bad as it was when seen in the office before and having more freq soft stools--feels like she has to have a BM alll the time. Was seen at office on 11/15/13 for lower abd pain--> dx with diverticulitis flair up and started on Augmentin. Took full course of Augmentin 125 mg BID x 7 days--had a red sunburn like rash all over body and severe itching from med but continued to take med until it was finished. Wants to know if something else can be called in because sxs are coming back.  Reviewed Health History In EMR: No  Reviewed Medications In EMR: No  Reviewed Allergies In EMR: No  Reviewed Surgeries / Procedures: No  Date of Onset of Symptoms:  11/23/2013  Treatments Tried: Tylenol  Treatments Tried Worked: No  Guideline(s) Used:  Abdominal Pain - Female  Disposition Per Guideline:   See Today in Office  Reason For Disposition Reached:   Moderate or mild pain that comes and goes (cramps) lasts > 24 hours  Advice Given:  N/A  Patient Will Follow Care Advice:  YES

## 2013-12-02 NOTE — Telephone Encounter (Signed)
Noted  

## 2013-12-12 ENCOUNTER — Ambulatory Visit (INDEPENDENT_AMBULATORY_CARE_PROVIDER_SITE_OTHER): Payer: Medicare Other | Admitting: Internal Medicine

## 2013-12-12 VITALS — BP 116/72 | HR 76 | Ht 63.0 in | Wt 146.0 lb

## 2013-12-12 DIAGNOSIS — I422 Other hypertrophic cardiomyopathy: Secondary | ICD-10-CM

## 2013-12-12 DIAGNOSIS — I10 Essential (primary) hypertension: Secondary | ICD-10-CM

## 2013-12-12 DIAGNOSIS — Z95810 Presence of automatic (implantable) cardiac defibrillator: Secondary | ICD-10-CM

## 2013-12-12 DIAGNOSIS — I480 Paroxysmal atrial fibrillation: Secondary | ICD-10-CM

## 2013-12-12 DIAGNOSIS — I5022 Chronic systolic (congestive) heart failure: Secondary | ICD-10-CM

## 2013-12-12 LAB — MDC_IDC_ENUM_SESS_TYPE_INCLINIC
Battery Remaining Longevity: 46.8 mo
Brady Statistic RA Percent Paced: 82 %
Brady Statistic RV Percent Paced: 96 %
Date Time Interrogation Session: 20151201141221
HIGH POWER IMPEDANCE MEASURED VALUE: 41.9566
HIGH POWER IMPEDANCE MEASURED VALUE: 42 Ohm
Implantable Pulse Generator Model: 3257
Implantable Pulse Generator Serial Number: 7032457
Lead Channel Impedance Value: 350 Ohm
Lead Channel Impedance Value: 400 Ohm
Lead Channel Impedance Value: 487.5 Ohm
Lead Channel Pacing Threshold Amplitude: 0.5 V
Lead Channel Pacing Threshold Amplitude: 1 V
Lead Channel Pacing Threshold Amplitude: 1 V
Lead Channel Pacing Threshold Amplitude: 1 V
Lead Channel Pacing Threshold Pulse Width: 0.5 ms
Lead Channel Sensing Intrinsic Amplitude: 1 mV
Lead Channel Sensing Intrinsic Amplitude: 12 mV
Lead Channel Setting Pacing Amplitude: 2 V
Lead Channel Setting Pacing Amplitude: 2.125
Lead Channel Setting Pacing Pulse Width: 0.5 ms
MDC IDC MSMT LEADCHNL LV PACING THRESHOLD PULSEWIDTH: 0.5 ms
MDC IDC MSMT LEADCHNL RA PACING THRESHOLD PULSEWIDTH: 0.5 ms
MDC IDC MSMT LEADCHNL RV PACING THRESHOLD PULSEWIDTH: 0.5 ms
MDC IDC SET LEADCHNL LV PACING PULSEWIDTH: 0.5 ms
MDC IDC SET LEADCHNL RV PACING AMPLITUDE: 2 V
MDC IDC SET LEADCHNL RV SENSING SENSITIVITY: 0.5 mV
MDC IDC SET ZONE DETECTION INTERVAL: 350 ms
Zone Setting Detection Interval: 260 ms
Zone Setting Detection Interval: 300 ms

## 2013-12-12 NOTE — Patient Instructions (Signed)
Your physician recommends that you continue on your current medications as directed. Please refer to the Current Medication list given to you today.  Your physician recommends that you return for lab work in: 3 months for BMET  Your physician recommends that you return for lab work in: 6 months for BMET (when come in for office visit with Dr. Caryl Comes)  Your physician wants you to follow-up in: 6 months with Dr. Caryl Comes. You will receive a reminder letter in the mail two months in advance. If you don't receive a letter, please call our office to schedule the follow-up appointment.  Remote monitoring is used to monitor your Pacemaker of ICD from home. This monitoring reduces the number of office visits required to check your device to one time per year. It allows [place] to keep an eye on the functioning of your device to ensure it is working properly. You are scheduled for a device check from home on 03/15/14. You may send your transmission at any time that day. If you have a wireless device, the transmission will be sent automatically. After your physician reviews your transmission, you will receive a postcard with your next transmission date.

## 2013-12-12 NOTE — Progress Notes (Signed)
Patient Care Team: Marin Olp, MD as PCP - General (Family Medicine) Haywood Lasso, MD (General Surgery)   HPI  Sandra [name] is a 80 y.o. female seen in followup for congestive heart failure in the setting of hypertrophic cardiomyopathy with depressed left ventricular function. She is status post CRT-D implantation.  She is on Coumadin. Because of recurrences of atrial fibrillation she is also a put on dofetilide    She is troubled with recurrent episodes of atrial fibrillation which have been quite symptomatic.    We have in the past discuss amiodarone versus AV junction ablation. She has been reluctant to consider amiodarone because of the side effects.  Interval echo 10/15 demonstrated RV dysfunction and biatrial enlargement (LAE-2.54) with an LVEF 20-25%   She was much better since her last visit. She is using the Aldactone and a half a tablet a day. there's been resolution of the edema and her dyspnea is absent in her day-to-day activities. She has not felt this well in some time. Interval potassium levels have been checked and are normal.   Last echocardiogram 30% 10/13 followed by AV optimization 2/14  Past Medical History  Diagnosis Date  . Atrial fibrillation     on Tikosyn and Coumadin  . Hyperlipidemia   . Hypothyroidism   . Bradycardia   . Cardiomyopathy, hypertrophic nonobstructive     ejection fraction 25%  . Anal fissure   . Adenomatous colon polyp   . Diverticulosis   . ICD-CRT     generator change 2013  . Systolic heart failure   . History of colonoscopy   . Migraines   . Hyperlipidemia   . Hypertension     Past Surgical History  Procedure Laterality Date  . Cardioversion    . Cardiac defibrillator placement  2007    pacemaker  . Vaginal hysterectomy      uterus alone  . Ovarian cyst removal      1989-benign    Current Outpatient Prescriptions  Medication Sig Dispense Refill  . albuterol (PROVENTIL HFA;VENTOLIN HFA)  108 (90 BASE) MCG/ACT inhaler Inhale 2 puffs into the lungs every 6 (six) hours as needed for wheezing or shortness of breath. 1 Inhaler 0  . amoxicillin-clavulanate (AUGMENTIN) 875-125 MG per tablet Take 1 tablet by mouth 2 (two) times daily. 14 tablet 0  . benazepril (LOTENSIN) 10 MG tablet TAKE ONE TABLET BY MOUTH TWICE DAILY 180 tablet 1  . carvedilol (COREG) 12.5 MG tablet TAKE ONE TABLET BY MOUTH TWICE DAILY WITH A MEAL 60 tablet 3  . clorazepate (TRANXENE) 7.5 MG tablet TAKE ONE TABLET BY MOUTH TWICE DAILY AS NEEDED 60 tablet 0  . dofetilide (TIKOSYN) 125 MCG capsule Take 1 capsule (125 mcg total) by mouth 2 (two) times daily. Takes with 224mcg cpasule to = 370mcg/dose 180 capsule 3  . ezetimibe (ZETIA) 10 MG tablet Take 1 tablet (10 mg total) by mouth daily. 30 tablet 1  . fluticasone (FLONASE) 50 MCG/ACT nasal spray Place 2 sprays into both nostrils daily. 16 g 1  . furosemide (LASIX) 40 MG tablet TAKE ONE TABLET BY MOUTH TWICE DAILY 180 tablet 3  . hydrocortisone-pramoxine (PROCTOFOAM HC) rectal foam Place 1 applicator rectally 2 (two) times daily. 10 g 0  . magnesium oxide (MAG-OX) 400 MG tablet TAKE ONE TABLET BY MOUTH ONCE DAILY 90 tablet 3  . spironolactone (ALDACTONE) 25 MG tablet Take 0.5 tablets (12.5 mg total) by mouth daily. 30 tablet 3  .  SYNTHROID 100 MCG tablet Take 1 tablet (100 mcg total) by mouth daily before breakfast. 90 tablet 0  . vitamin E 400 UNIT capsule Take 400 Units by mouth every other day.     . warfarin (COUMADIN) 5 MG tablet TAKE AS DIRECTED 90 tablet 0   No current facility-administered medications for this visit.    Allergies  Allergen Reactions  . Levofloxacin     Due to cardiac  AF  . Procaine Hcl     REACTION: smothers  . Amoxicillin     Rash   . Codeine     REACTION: nausea  . Niacin     REACTION: rash  . Ramipril     REACTION: unspecified  . Statins     REACTION: muscle aches    Review of Systems negative except from HPI and  PMH  Physical Exam There were no vitals taken for this visit. Well developed and well nourished in no acute distress HENT normal E scleral and icterus clear Neck Supple JVP  greater than 10 cm  carotids brisk and full Clear to ausculation Device pocket well healed; without hematoma or erythema.  There is no tethering   Regular rate and rhythm, no murmurs gallops or rub Soft with active bowel sounds No clubbing cyanosis  trace  Edema Alert and oriented, grossly normal motor and sensory function Skin Warm and Dry  ECG demonstrates AV pacing Interval 24/17/51 Axis leftward Assessment and  Plan  Hypertrophic cardiomyopathy  Left ventricular dysfunction  Implantable defibrillator-CRT-St. Jude  The patient's device was interrogated.  The information was reviewed. The AV delay was shortened on a paced AV 250--200  Atrial fibrillation -on dofetilide  Congestive heart failure-chronic-systolic  High risk drug monitoring potassium levels stable on Aldactone    She is significantly improved and is euvolemic. Her atrial fibrillation burden is stable. We will leave her on her current medications. We'll need to follow her high risk medication i.e. Aldactone.

## 2013-12-14 ENCOUNTER — Ambulatory Visit (INDEPENDENT_AMBULATORY_CARE_PROVIDER_SITE_OTHER): Payer: Medicare Other | Admitting: Family

## 2013-12-14 DIAGNOSIS — Z5181 Encounter for therapeutic drug level monitoring: Secondary | ICD-10-CM

## 2013-12-14 LAB — POCT INR: INR: 2

## 2013-12-14 NOTE — Patient Instructions (Signed)
Continue to take 1/2 tablet all days except take 1 tablet on M/F.  Re-check on 6 weeks.  Anticoagulation Dose Instructions as of 12/14/2013      Dorene Grebe Tue Wed Thu Fri Sat   New Dose 2.5 mg 5 mg 2.5 mg 2.5 mg 2.5 mg 5 mg 2.5 mg    Description        Continue to take 1/2 tablet all days except take 1 tablet on M/F.  Re-check on 6 weeks.

## 2013-12-21 ENCOUNTER — Encounter (HOSPITAL_COMMUNITY): Payer: Self-pay | Admitting: Internal Medicine

## 2013-12-26 ENCOUNTER — Encounter: Payer: Self-pay | Admitting: Internal Medicine

## 2014-01-08 ENCOUNTER — Other Ambulatory Visit: Payer: Self-pay

## 2014-01-08 MED ORDER — DOFETILIDE 250 MCG PO CAPS: 250.0000 ug | ORAL_CAPSULE | Freq: Two times a day (BID) | ORAL | Status: DC

## 2014-01-08 MED ORDER — DOFETILIDE 125 MCG PO CAPS: 125.0000 ug | ORAL_CAPSULE | Freq: Two times a day (BID) | ORAL | Status: DC

## 2014-01-09 ENCOUNTER — Telehealth: Payer: Self-pay

## 2014-01-09 NOTE — Telephone Encounter (Signed)
Sandra [name] (pt) called in saying her Tikosyn refill was not sent in. I told pt it was sent in on Jan 08, 2014 and asked when she called her pharmacy to check if it was refilled and she stated "I called the other day" I explained that she needed to call pharmacy today to check status of refill and if they did not receive it to call us back. Pt stated her understanding.  Approved      Disp Refills Start End    dofetilide (TIKOSYN) 125 MCG capsule 180 capsule 3 01/08/2014     Sig - Route:  Take 1 capsule (125 mcg total) by mouth 2 (two) times daily. Takes with 236mcg cpasule to = 365mcg/dose - Oral    Class:  Normal    DAW:  No    Authorizing Provider:  Deboraha Sprang, MD    Ordering User:  Guinevere Ferrari    dofetilide [facility]) 250 MCG capsule 180 capsule 2 01/08/2014     Sig - Route:  Take 1 capsule (250 mcg total) by mouth 2 (two) times daily. - Oral    Class:  Normal    Comment:  takes with 174mcg capsule to = 372mcg/dose    Authorizing Provider:  Deboraha Sprang, MD    Ordering User:  Guinevere Ferrari

## 2014-01-16 ENCOUNTER — Telehealth: Payer: Self-pay | Admitting: Family Medicine

## 2014-01-16 NOTE — Telephone Encounter (Signed)
PLEASE NOTE: All timestamps contained within this report are represented as [place] Standard Time. CONFIDENTIALTY NOTICE: This fax transmission is intended only for the addressee. It contains information that is legally privileged, confidential or otherwise protected from use or disclosure. If you are not the intended recipient, you are strictly prohibited from reviewing, disclosing, copying using or disseminating any of this information or taking any action in reliance on or regarding this information. If you have received this fax in error, please notify us immediately by telephone so that we can arrange for its return to [place]. Phone: [phone], Toll-Free: [phone], Fax: [phone] Page: 1 of 2 Call Id: [id number] [facility] - Client [place] Patient Name: Sandra [name] Gender: Female DOB: 1932/05/25 Age: 81 Y 11 M 13 D Return Phone Number: [phone] (Primary) Address: [place]/State/Zip: [place] [address] Client [facility] - Client Client Site [place] - Day Physician Garret Reddish Contact Type Call Call Type Triage / Clinical Relationship To Patient Self Return Phone Number [phone] (Primary) Chief Complaint Finger Pain Initial Comment [name] states her thumb is hurting. PreDisposition Call Doctor Nurse Assessment Nurse: Julien Girt, RN, Almyra Free Date/Time Eilene Ghazi Time): 01/16/2014 9:23:10 AM Confirm and document reason for call. If symptomatic, describe symptoms. ---[name] stets her right thumb is swollen and sore, has been like this for a long time and got worse before Christmas. She has been taking her husband's mobic, it did help but she is on coumadin so she stopped. Has the patient traveled out of the country within the last 30 days? ---Not Applicable Does the patient require triage? ---Yes Related visit to physician within the  last 2 weeks? ---No Does the PT have any chronic conditions? (i.e. diabetes, asthma, etc.) ---Yes List chronic conditions. ---hx Gout, arthritis, a fib, congenital heart defect Guidelines Guideline Title Affirmed Question Affirmed Notes Nurse Date/Time Eilene Ghazi Time) Finger Pain [1] MODERATE pain (e.g., interferes with normal activities) AND [2] present > 3 days Chancy Hurter 01/16/2014 9:26:33 AM Disp. Time Eilene Ghazi Time) Disposition Final User 01/16/2014 9:31:35 AM Called On-Call Provider Julien Girt, RN, Almyra Free Reason: Warm transfer to the [facility] for an appt., at request of the caller. 01/16/2014 9:31:44 AM Call Completed Julien Girt RN, Almyra Free 01/16/2014 9:27:46 AM See PCP When Office is Open (within 3 days) Yes Julien Girt, RN, Almyra Free PLEASE NOTE: All timestamps contained within this report are represented as [place] Standard Time. CONFIDENTIALTY NOTICE: This fax transmission is intended only for the addressee. It contains information that is legally privileged, confidential or otherwise protected from use or disclosure. If you are not the intended recipient, you are strictly prohibited from reviewing, disclosing, copying using or disseminating any of this information or taking any action in reliance on or regarding this information. If you have received this fax in error, please notify us immediately by telephone so that we can arrange for its return to [place]. Phone: [phone], Toll-Free: [phone], Fax: [phone] Page: 2 of 2 Call Id: [id number] [name] Understands: Yes Disagree/Comply: Comply Care Advice Given Per Guideline SEE PCP WITHIN 3 DAYS: You need to be examined within 2 or 3 days. Call your doctor during regular office hours and make an appointment. (Note: if office will be open tomorrow, tell caller to call then, not in 3 days). ACETAMINOPHEN (E.G., TYLENOL): * You become worse. CARE ADVICE given per Finger Pain (Adult) guideline. After Care Instructions Given Call Event Type User  Date / Time Description Comments User:  Eloisa Northern, RN Date/Time Eilene Ghazi Time): 01/16/2014 9:31:02 AM Advised caller NOT to take any more Mobic or OTC NSAIDS due to her Coumadin ! She verbalized understanding.

## 2014-01-18 ENCOUNTER — Ambulatory Visit (INDEPENDENT_AMBULATORY_CARE_PROVIDER_SITE_OTHER): Payer: Medicare Other | Admitting: Family

## 2014-01-18 ENCOUNTER — Ambulatory Visit (INDEPENDENT_AMBULATORY_CARE_PROVIDER_SITE_OTHER): Payer: Medicare Other | Admitting: Family Medicine

## 2014-01-18 ENCOUNTER — Encounter: Payer: Self-pay | Admitting: Family Medicine

## 2014-01-18 VITALS — BP 108/64 | Temp 97.8°F | Wt 149.0 lb

## 2014-01-18 DIAGNOSIS — Z5181 Encounter for therapeutic drug level monitoring: Secondary | ICD-10-CM

## 2014-01-18 DIAGNOSIS — M1A9XX1 Chronic gout, unspecified, with tophus (tophi): Secondary | ICD-10-CM

## 2014-01-18 LAB — POCT INR: INR: 3.1

## 2014-01-18 MED ORDER — HYDROCORTISONE 2.5 % RE CREA: 1.0000 "application " | TOPICAL_CREAM | Freq: Two times a day (BID) | RECTAL | Status: DC

## 2014-01-18 MED ORDER — PREDNISONE 20 MG PO TABS: ORAL_TABLET | ORAL | Status: DC

## 2014-01-18 NOTE — Patient Instructions (Signed)
Hold Coumadin today only. Continue to take 1/2 tablet all days except take 1 tablet on M/F.  Re-check on 6 weeks.  Anticoagulation Dose Instructions as of 01/18/2014      Sandra [name] Tue Wed Thu Fri Sat   New Dose 2.5 mg 5 mg 2.5 mg 2.5 mg 2.5 mg 5 mg 2.5 mg    Description        Hold Coumadin today only. Continue to take 1/2 tablet all days except take 1 tablet on M/F.  Re-check on 6 weeks.

## 2014-01-18 NOTE — Patient Instructions (Addendum)
Gout  Prednisone for 9 days  Inr check in 2 weeks  Follow up if no improvement in this time or if worsens.   Check uric acid when we see you back and consider starting you on medicine for prevention. You have now had 3 attacks since June 2015 so approximately 1 every 2 months. Diuretics can affect this but you need them for your heart unfortunately.

## 2014-01-18 NOTE — Progress Notes (Signed)
Garret Reddish, MD Phone: [phone]  Subjective:   Sandra [name] is a 81 y.o. year old very pleasant female patient who presents with the following:  Thumb swelling and pain X 3 days, took mobic last night 1/2 dose which improved pain some as well as swelling. Similar episode before christmas that resolved with some prednisone she had at home. Has tolerated prednisone in the past. Was thought to hae paronychia last June and sent to hand surgeon who recognized gout and treated with prednisone due to renal insufficiency. This current episode is typical of her gout pain ROS- no fever/chills/purulent drainage or fluctuant area.   Past Medical History- Patient Active Problem List   Diagnosis Date Noted  . Chronic systolic heart failure 37/16/9678    Priority: High  . Biventricular implantable cardioverter-defibrillator -St. Jude 05/15/2010    Priority: High  . Hypertrophic obstructive cardiomyopathy 02/08/2007    Priority: High  . Atrial fibrillation 07/08/2006    Priority: High  . Essential hypertension, benign 10/16/2013    Priority: Medium  . Gout 10/16/2013    Priority: Medium  . Anxiety 03/31/2010    Priority: Medium  . Chronic kidney disease, stage III (moderate) 12/30/2009    Priority: Medium  . Hypothyroidism 07/08/2006    Priority: Medium  . Hyperlipidemia 07/08/2006    Priority: Medium  . Encounter for therapeutic drug monitoring 02/13/2013    Priority: Low  . Dyspnea 12/07/2011    Priority: Low  . RIATA Lead 03/23/2011    Priority: Low  . History of colonic polyps 09/10/2008    Priority: Low  . Fibrocystic breast disease 09/17/1991    Priority: Low   Medications- reviewed and updated Current Outpatient Prescriptions  Medication Sig Dispense Refill  . benazepril (LOTENSIN) 10 MG tablet TAKE ONE TABLET BY MOUTH TWICE DAILY 180 tablet 1  . carvedilol (COREG) 12.5 MG tablet TAKE ONE TABLET BY MOUTH TWICE DAILY WITH A MEAL 60 tablet 3  . clorazepate  (TRANXENE) 7.5 MG tablet TAKE ONE TABLET BY MOUTH TWICE DAILY AS NEEDED 60 tablet 0  . dofetilide (TIKOSYN) 125 MCG capsule Take 1 capsule (125 mcg total) by mouth 2 (two) times daily. Takes with 267mcg cpasule to = 366mcg/dose 180 capsule 3  . dofetilide (TIKOSYN) 250 MCG capsule Take 1 capsule (250 mcg total) by mouth 2 (two) times daily. 180 capsule 2  . ezetimibe (ZETIA) 10 MG tablet Take 1 tablet (10 mg total) by mouth daily. 30 tablet 1  . fluticasone (FLONASE) 50 MCG/ACT nasal spray Place 2 sprays into both nostrils daily. 16 g 1  . furosemide (LASIX) 40 MG tablet TAKE ONE TABLET BY MOUTH TWICE DAILY 180 tablet 3  . spironolactone (ALDACTONE) 25 MG tablet Take 0.5 tablets (12.5 mg total) by mouth daily. 30 tablet 3  . SYNTHROID 100 MCG tablet Take 1 tablet (100 mcg total) by mouth daily before breakfast. 90 tablet 0  . vitamin E 400 UNIT capsule Take 400 Units by mouth every other day.     . warfarin (COUMADIN) 5 MG tablet TAKE AS DIRECTED 90 tablet 0  . albuterol (PROVENTIL HFA;VENTOLIN HFA) 108 (90 BASE) MCG/ACT inhaler Inhale 2 puffs into the lungs every 6 (six) hours as needed for wheezing or shortness of breath. (Patient not taking: Reported on 01/18/2014) 1 Inhaler 0  . hydrocortisone (ANUSOL-HC) 2.5 % rectal cream Place 1 application rectally 2 (two) times daily. 30 g 0  . predniSONE (DELTASONE) 20 MG tablet Take 2 tablets for 3 days, 1  tablet for 3 days, 1/2 tablet for 3 days then stop 12 tablet 0   No current facility-administered medications for this visit.    Objective: BP 108/64 mmHg  Temp(Src) 97.8 F (36.6 C)  Wt 149 lb (67.586 kg) Gen: NAD, resting comfortably Ext: no edema in legs Skin: warm, dry, erythema over right thumb MSK: swelling and erythema over right thumb at IP joint. Pain on ulnar side with palpation up to 9/10, radial side not painful to palpation, mild pain with palpation of pulp of hand. Dos not appear to be paronychia. Mild changes on DIP of 2nd finger on  right hand.       Assessment/Plan:  Gout Flare today. Treat with prednisone taper. Avoid nsaids due to renal insufficiency also making colcyrs not the best option. Check uric acid when patient not in flare. Consider allopurinol-GFR at 30 technically does not limit use but would consider 200mg  maximum and may use steroids for flares while getting onto allopurinol.    Return precautions advised.   Meds ordered this encounter  Medications  . predniSONE (DELTASONE) 20 MG tablet    Sig: Take 2 tablets for 3 days, 1 tablet for 3 days, 1/2 tablet for 3 days then stop    Dispense:  12 tablet    Refill:  0  . hydrocortisone (ANUSOL-HC) 2.5 % rectal cream    Sig: Place 1 application rectally 2 (two) times daily.    Dispense:  30 g    Refill:  0

## 2014-01-18 NOTE — Assessment & Plan Note (Signed)
Flare today. Treat with prednisone taper. Avoid nsaids due to renal insufficiency also making colcyrs not the best option. Check uric acid when patient not in flare. Consider allopurinol-GFR at 30 technically does not limit use but would consider 200mg  maximum and may use steroids for flares while getting onto allopurinol.

## 2014-01-25 ENCOUNTER — Telehealth: Payer: Self-pay | Admitting: Internal Medicine

## 2014-01-25 ENCOUNTER — Ambulatory Visit: Payer: Medicare Other | Admitting: Family

## 2014-01-25 NOTE — Telephone Encounter (Signed)
New Message      Patients insurance company fax a tier that needs to be fax back to [employer], it was faxed.   Call patient back to let know if Dr. Caryl Comes has filled out and fax paperwork [phone] pt cell phone #

## 2014-01-26 NOTE — Telephone Encounter (Signed)
Routing to [place] in refill department to check on this. Patient stating PA form sent by insurance company.

## 2014-01-30 NOTE — Telephone Encounter (Signed)
Follow up      Calling to follow up on prior authorization for tikosyn.  Pt received another letter from the [employer] stating they need more info from the physician.  Please call pt Wednesday.

## 2014-01-31 NOTE — Telephone Encounter (Signed)
F/U         Pt calling back about prior auth for tikosyn.   Please call pt on cell at [phone].

## 2014-01-31 NOTE — Telephone Encounter (Signed)
F/u           Pt returning call. Please call back.

## 2014-01-31 NOTE — Telephone Encounter (Signed)
I spoke with Nira Conn at [facility] who tells me patient filled 90 day supply on 12/30, and she has refills remaining. She states patient is good for the rest of the year for refilling medication.  Will wait for patient to contact me (I lmtcb) to determine reason/need for calling.

## 2014-01-31 NOTE — Telephone Encounter (Signed)
Faxed PA form to [facility]. Informed patient.

## 2014-02-01 ENCOUNTER — Ambulatory Visit (INDEPENDENT_AMBULATORY_CARE_PROVIDER_SITE_OTHER): Payer: Medicare Other | Admitting: Family

## 2014-02-01 DIAGNOSIS — Z5181 Encounter for therapeutic drug level monitoring: Secondary | ICD-10-CM

## 2014-02-01 LAB — POCT INR: INR: 4.3

## 2014-02-01 NOTE — Patient Instructions (Signed)
Hold Coumadin today and tomorrow. Continue to take 1/2 tablet all days except take 1 tablet on M/F.  Recheck Feb 9th with appt with Dr. Yong Channel.   Anticoagulation Dose Instructions as of 02/01/2014      Sandra [name] Tue Wed Thu Fri Sat   New Dose 2.5 mg 5 mg 2.5 mg 2.5 mg 2.5 mg 5 mg 2.5 mg    Description        Hold Coumadin today and tomorrow. Continue to take 1/2 tablet all days except take 1 tablet on M/F.  Recheck Feb 9th with appt with Dr. Yong Channel.

## 2014-02-05 ENCOUNTER — Other Ambulatory Visit: Payer: Self-pay | Admitting: Family

## 2014-02-05 ENCOUNTER — Other Ambulatory Visit: Payer: Self-pay | Admitting: Family Medicine

## 2014-02-06 ENCOUNTER — Telehealth: Payer: Self-pay | Admitting: Internal Medicine

## 2014-02-06 NOTE — Telephone Encounter (Signed)
Follow Up    Pt returning call from yesterday. Please call.

## 2014-02-06 NOTE — Telephone Encounter (Signed)
Patient calling to inform me that she received denial letter about her Tikosyn. Explained that we have received the same letter and are in the process of sending appeal information to company. We will contact her once we have heard back from [facility] on appeal. She is agreeable to this plan of care.

## 2014-02-07 ENCOUNTER — Other Ambulatory Visit: Payer: Self-pay | Admitting: *Deleted

## 2014-02-07 ENCOUNTER — Encounter: Payer: Self-pay | Admitting: *Deleted

## 2014-02-07 MED ORDER — CARVEDILOL 12.5 MG PO TABS: 12.5000 mg | ORAL_TABLET | Freq: Two times a day (BID) | ORAL | Status: DC

## 2014-02-09 ENCOUNTER — Telehealth: Payer: Self-pay | Admitting: Internal Medicine

## 2014-02-09 NOTE — Telephone Encounter (Signed)
New message      Talk to a nurse regarding an appeal for tikosyn.

## 2014-02-12 ENCOUNTER — Encounter: Payer: Self-pay | Admitting: Internal Medicine

## 2014-02-12 NOTE — Telephone Encounter (Signed)
Call Documentation      Marvia Pickles Respus at 02/12/2014 1:57 PM     Status: Signed       Expand All Collapse All   New Message         Nikki from [place] calling to speak to [place] and won't state what it is in regards to. Please call back and advise.            Reve A Henry at 02/12/2014 1:06 PM     Status: Signed       Expand All Collapse All   F/U       Lexine Baton calling from [facility] about a prior authorization request for tikosyn   Please contact at [phone].            Reve A Henry at 02/12/2014 12:48 PM     Status: Signed       Expand All Collapse All   New Msg         Lam from [facility] appeals calling, please call at [phone] ext [phone].    Please return call.

## 2014-02-12 NOTE — Telephone Encounter (Signed)
New Msg         Lam from [facility] appeals calling, please call at [phone] ext [phone].    Please return call.

## 2014-02-12 NOTE — Telephone Encounter (Signed)
Spoke with Lexine Baton at [facility]. Tikosyn approved. They will fax confirmation of approval.

## 2014-02-12 NOTE — Telephone Encounter (Signed)
New Message         Lexine Baton from [place] calling to speak to [place] and won't state what it is in regards to. Please call back and advise.

## 2014-02-12 NOTE — Telephone Encounter (Signed)
This encounter was created in error - please disregard.

## 2014-02-12 NOTE — Telephone Encounter (Signed)
F/U       Lexine Baton calling from [facility] about a prior authorization request for tikosyn   Please contact at [phone].

## 2014-02-20 ENCOUNTER — Encounter: Payer: Self-pay | Admitting: Family Medicine

## 2014-02-20 ENCOUNTER — Ambulatory Visit: Payer: Medicare Other | Admitting: *Deleted

## 2014-02-20 ENCOUNTER — Ambulatory Visit (INDEPENDENT_AMBULATORY_CARE_PROVIDER_SITE_OTHER): Payer: Medicare Other | Admitting: Family Medicine

## 2014-02-20 VITALS — BP 100/72 | Temp 97.9°F | Wt 149.0 lb

## 2014-02-20 DIAGNOSIS — N183 Chronic kidney disease, stage 3 unspecified: Secondary | ICD-10-CM

## 2014-02-20 DIAGNOSIS — E785 Hyperlipidemia, unspecified: Secondary | ICD-10-CM

## 2014-02-20 DIAGNOSIS — I480 Paroxysmal atrial fibrillation: Secondary | ICD-10-CM

## 2014-02-20 DIAGNOSIS — Z5181 Encounter for therapeutic drug level monitoring: Secondary | ICD-10-CM

## 2014-02-20 DIAGNOSIS — I5022 Chronic systolic (congestive) heart failure: Secondary | ICD-10-CM

## 2014-02-20 LAB — BASIC METABOLIC PANEL
BUN: 34 mg/dL — ABNORMAL HIGH (ref 6–23)
CHLORIDE: 105 meq/L (ref 96–112)
CO2: 30 meq/L (ref 19–32)
Calcium: 9.7 mg/dL (ref 8.4–10.5)
Creatinine, Ser: 1.64 mg/dL — ABNORMAL HIGH (ref 0.40–1.20)
GFR: 32.03 mL/min — ABNORMAL LOW (ref >60.00)
Glucose, Bld: 74 mg/dL (ref 70–99)
POTASSIUM: 4.2 meq/L (ref 3.5–5.1)
SODIUM: 140 meq/L (ref 135–145)

## 2014-02-20 LAB — POCT INR: INR: 3.8

## 2014-02-20 NOTE — Assessment & Plan Note (Signed)
Patient was getting possible myalgias on daily zetia which drastically decreased on every other day dosing. With GFR near 30, may have up to 1.5x serum level zetia so I agreed to change to every other day. Benefit/risk balance at age 81 for primary prevention unclear.

## 2014-02-20 NOTE — Progress Notes (Signed)
Garret Reddish, MD Phone: [phone]  Subjective:   Sandra [name] is a 81 y.o. year old very pleasant female patient who presents with the following:  Hyperlipidemia. mild poor control. Back aches, improved on zetia every other day. GFR near 30 increases amount of zetia.  A fib. Palpitations about a week ago then went back into normal rhythm. Compliant with Tikosyn, coreg 12.5 mg BID. Also on coumadin through our clinic with INR today at 3.8 despite recent decrease in dosing 2 weeks ago.   ckd III. Stable on benazepril. bmet  ROS-Soft stools daily no diarrhea. No chest pain or shortness of breath. No oliguria.   Past Medical History- Patient Active Problem List   Diagnosis Date Noted  . Chronic systolic heart failure 62/83/6629    Priority: High  . Biventricular implantable cardioverter-defibrillator -St. Jude 05/15/2010    Priority: High  . Hypertrophic obstructive cardiomyopathy 02/08/2007    Priority: High  . Atrial fibrillation 07/08/2006    Priority: High  . Essential hypertension, benign 10/16/2013    Priority: Medium  . Gout 10/16/2013    Priority: Medium  . Anxiety 03/31/2010    Priority: Medium  . Chronic kidney disease, stage III (moderate) 12/30/2009    Priority: Medium  . Hypothyroidism 07/08/2006    Priority: Medium  . Hyperlipidemia 07/08/2006    Priority: Medium  . Encounter for therapeutic drug monitoring 02/13/2013    Priority: Low  . Dyspnea 12/07/2011    Priority: Low  . RIATA Lead 03/23/2011    Priority: Low  . History of colonic polyps 09/10/2008    Priority: Low  . Fibrocystic breast disease 09/17/1991    Priority: Low   Medications- reviewed and updated Current Outpatient Prescriptions  Medication Sig Dispense Refill  . benazepril (LOTENSIN) 10 MG tablet TAKE ONE TABLET BY MOUTH TWICE DAILY 180 tablet 1  . carvedilol (COREG) 12.5 MG tablet Take 1 tablet (12.5 mg total) by mouth 2 (two) times daily with a meal. 180 tablet 0  .  clorazepate (TRANXENE) 7.5 MG tablet TAKE ONE TABLET BY MOUTH TWICE DAILY AS NEEDED 60 tablet 0  . dofetilide (TIKOSYN) 125 MCG capsule Take 1 capsule (125 mcg total) by mouth 2 (two) times daily. Takes with 271mcg cpasule to = 38mcg/dose 180 capsule 3  . dofetilide (TIKOSYN) 250 MCG capsule Take 1 capsule (250 mcg total) by mouth 2 (two) times daily. 180 capsule 2  . furosemide (LASIX) 40 MG tablet TAKE ONE TABLET BY MOUTH TWICE DAILY 180 tablet 3  . predniSONE (DELTASONE) 20 MG tablet Take 2 tablets for 3 days, 1 tablet for 3 days, 1/2 tablet for 3 days then stop 12 tablet 0  . spironolactone (ALDACTONE) 25 MG tablet Take 0.5 tablets (12.5 mg total) by mouth daily. 30 tablet 3  . SYNTHROID 100 MCG tablet TAKE ONE TABLET BY MOUTH DAILY BEFORE BREAKFAST. 90 tablet 1  . vitamin E 400 UNIT capsule Take 400 Units by mouth every other day.     . warfarin (COUMADIN) 5 MG tablet TAKE AS DIRECTED 90 tablet 1  . ZETIA 10 MG tablet TAKE ONE TABLET BY MOUTH ONCE DAILY 30 tablet 5  . albuterol (PROVENTIL HFA;VENTOLIN HFA) 108 (90 BASE) MCG/ACT inhaler Inhale 2 puffs into the lungs every 6 (six) hours as needed for wheezing or shortness of breath. (Patient not taking: Reported on 01/18/2014) 1 Inhaler 0  . fluticasone (FLONASE) 50 MCG/ACT nasal spray Place 2 sprays into both nostrils daily. (Patient not taking: Reported on 02/20/2014)  16 g 1  . hydrocortisone (ANUSOL-HC) 2.5 % rectal cream Place 1 application rectally 2 (two) times daily. (Patient not taking: Reported on 02/20/2014) 30 g 0   No current facility-administered medications for this visit.    Objective: BP 100/72 mmHg  Temp(Src) 97.9 F (36.6 C)  Wt 149 lb (67.586 kg) Gen: NAD, resting comfortably CV: RRR  Lungs: CTAB no crackles, wheeze, rhonchi Abdomen: soft/nontender/nondistended/normal bowel sounds. Ext: no edema Skin: warm, dry, no rash, tophi at thumb noted Neuro: grossly normal, moves all  extremities  Assessment/Plan:  Hyperlipidemia Patient was getting possible myalgias on daily zetia which drastically decreased on every other day dosing. With GFR near 30, may have up to 1.5x serum level zetia so I agreed to change to every other day. Benefit/risk balance at age 89 for primary prevention unclear.    Atrial fibrillation Continue Tikosyn, coreg 12.5 mg BID. Regarding INR, hold coumadin next 2 days then reduce total daily dose by 2.5mg  as per AVS. Coumadin clinic in 2 weeks.    Chronic kidney disease, stage III (moderate) Check bmet today. Continue benazepril. Consider nephrology referral if consistently with GFR <30 (2-3 consecutive visits) though has not occurred yet with last GFRs above 30 x2.    Return precautions advised. 4 months planned. We discussed gout treatment limited by CKD but could use prednisone for flares. Could perhaps trial allopurinol or uloric at reduced doses and prednisone low dose while starting.   Orders Placed This Encounter  Procedures  . Basic metabolic panel    [place]

## 2014-02-20 NOTE — Assessment & Plan Note (Signed)
Continue Tikosyn, coreg 12.5 mg BID. Regarding INR, hold coumadin next 2 days then reduce total daily dose by 2.5mg  as per AVS. Coumadin clinic in 2 weeks.

## 2014-02-20 NOTE — Assessment & Plan Note (Signed)
Check bmet today. Continue benazepril. Consider nephrology referral if consistently with GFR <30 (2-3 consecutive visits) though has not occurred yet with last GFRs above 30 x2.

## 2014-02-20 NOTE — Patient Instructions (Addendum)
Change zetia to every other day  Check kidney function  No other changes  4 months follow up  Hold Coumadin today and tomorrow. Continue to take 1/2 tablet all days except take 1 tablet on M. set up coumadin clinic in 2 weeks.   INR 3.8

## 2014-02-26 ENCOUNTER — Encounter: Payer: Self-pay | Admitting: Internal Medicine

## 2014-03-01 ENCOUNTER — Ambulatory Visit: Payer: Medicare Other

## 2014-03-05 ENCOUNTER — Ambulatory Visit (INDEPENDENT_AMBULATORY_CARE_PROVIDER_SITE_OTHER): Payer: Medicare Other | Admitting: General Practice

## 2014-03-05 DIAGNOSIS — Z5181 Encounter for therapeutic drug level monitoring: Secondary | ICD-10-CM

## 2014-03-05 LAB — POCT INR: INR: 2.3

## 2014-03-05 NOTE — Progress Notes (Signed)
Pre visit review using our clinic review tool, if applicable. No additional management support is needed unless otherwise documented below in the visit note. 

## 2014-03-09 ENCOUNTER — Telehealth: Payer: Self-pay | Admitting: Internal Medicine

## 2014-03-09 NOTE — Telephone Encounter (Signed)
Patient was instructed to call by here by Cardiology about advice on her abdominal pain she has had since December.  She is questioning if it is a side effect of her spironolactone.  Patient has not been seen since 2011 and only for a direct colonoscopy.  She is advised that she would need evaluation prior to any advice since she has not been seen in 5 years.  She is scheduled for an office visit for 04/30/2014 with Dr. Carlean Purl.  Her primary care has been treating her for her diverticulitis.  She is advised that she will need to see her primary care until office visit in April.

## 2014-03-09 NOTE — Telephone Encounter (Signed)
Follow Up         Pt calling back to speak to [facility]. Wanted to notify her that Dr. Karie Schwalbe office wouldn't be able to see her until April 18th. Pt states that she is going to stop taking the previously discussed medication and wait for a call back from [place]. Please call back and advise.

## 2014-03-09 NOTE — Telephone Encounter (Signed)
New message     Pt c/o medication issue:  1. Name of Medication: spronolactone 2. How are you currently taking this medication (dosage and times per day)? 12.5 daily  3. Are you having a reaction (difficulty breathing--STAT)? Pt thinks she is 4. What is your medication issue?  Pt says she is having a reaction to the medication.  Her lower abd hurts and left side.  She thinks it is from the medication.  Please advise

## 2014-03-09 NOTE — Telephone Encounter (Signed)
Patient tells me she is having an ache from her lower stomach and complains of left side also hurting.   She explains that she was treated for diverticulitis in December and isn't sure if this is related to GI or heart medication. She states that pain is not as severe as it was when she was taking magnesium. She reports soft stools, denies diarrhea, denies any blood in stool. Patient advised to contact GI first to r/o diverticulitis flare prior to medication changes.   She may hold Spironolactone the next few days to see if improvement occurs. She will call me back once reviewed with GI.

## 2014-03-14 NOTE — Telephone Encounter (Signed)
Made patient aware Dr. Caryl Comes reviewed yesterday.  Ok to stop Aldactone until seeing Dr. Hope Pigeon next month to determine if GI is the problem or if it was related to Aldactone.  She has blood work previously scheduled for next week, and I will check lab results to make sure electrolytes and kidney function are within normal limits. She will call us if symptoms appear/re-start.

## 2014-03-15 ENCOUNTER — Ambulatory Visit (INDEPENDENT_AMBULATORY_CARE_PROVIDER_SITE_OTHER): Payer: Medicare Other | Admitting: *Deleted

## 2014-03-15 ENCOUNTER — Encounter: Payer: Self-pay | Admitting: Internal Medicine

## 2014-03-15 DIAGNOSIS — I422 Other hypertrophic cardiomyopathy: Secondary | ICD-10-CM

## 2014-03-15 DIAGNOSIS — I5022 Chronic systolic (congestive) heart failure: Secondary | ICD-10-CM

## 2014-03-16 ENCOUNTER — Other Ambulatory Visit (INDEPENDENT_AMBULATORY_CARE_PROVIDER_SITE_OTHER): Payer: Medicare Other | Admitting: *Deleted

## 2014-03-16 DIAGNOSIS — I5022 Chronic systolic (congestive) heart failure: Secondary | ICD-10-CM

## 2014-03-16 DIAGNOSIS — I480 Paroxysmal atrial fibrillation: Secondary | ICD-10-CM

## 2014-03-16 LAB — BASIC METABOLIC PANEL
BUN: 34 mg/dL — AB (ref 6–23)
CALCIUM: 9.2 mg/dL (ref 8.4–10.5)
CO2: 30 mEq/L (ref 19–32)
Chloride: 107 mEq/L (ref 96–112)
Creatinine, Ser: 1.66 mg/dL — ABNORMAL HIGH (ref 0.40–1.20)
GFR: 31.58 mL/min — ABNORMAL LOW (ref >60.00)
GLUCOSE: 64 mg/dL — AB (ref 70–99)
Potassium: 4 mEq/L (ref 3.5–5.1)
SODIUM: 141 meq/L (ref 135–145)

## 2014-03-16 NOTE — Progress Notes (Signed)
Remote ICD transmission.   

## 2014-03-19 ENCOUNTER — Ambulatory Visit: Payer: Medicare Other

## 2014-03-20 LAB — MDC_IDC_ENUM_SESS_TYPE_REMOTE
Battery Remaining Percentage: 59 %
Battery Voltage: 2.93 V
Brady Statistic AP VP Percent: 96 %
Brady Statistic AP VS Percent: 1 %
Brady Statistic AS VP Percent: 4 %
Brady Statistic AS VS Percent: 1 %
Brady Statistic RA Percent Paced: 83 %
Date Time Interrogation Session: 20160303070018
HighPow Impedance: 43 Ohm
Implantable Pulse Generator Serial Number: 7032457
Lead Channel Impedance Value: 350 Ohm
Lead Channel Impedance Value: 540 Ohm
Lead Channel Pacing Threshold Amplitude: 0.875 V
Lead Channel Pacing Threshold Amplitude: 1.625 V
Lead Channel Pacing Threshold Pulse Width: 0.5 ms
Lead Channel Sensing Intrinsic Amplitude: 12 mV
Lead Channel Setting Pacing Amplitude: 2 V
Lead Channel Setting Pacing Amplitude: 3.125
Lead Channel Setting Pacing Pulse Width: 0.5 ms
Lead Channel Setting Sensing Sensitivity: 0.5 mV
MDC IDC MSMT BATTERY REMAINING LONGEVITY: 46 mo
MDC IDC MSMT LEADCHNL RA IMPEDANCE VALUE: 410 Ohm
MDC IDC MSMT LEADCHNL RA SENSING INTR AMPL: 1.2 mV
MDC IDC MSMT LEADCHNL RV PACING THRESHOLD PULSEWIDTH: 0.5 ms
MDC IDC PG MODEL: 3257
MDC IDC SET LEADCHNL RV PACING AMPLITUDE: 2 V
MDC IDC SET LEADCHNL RV PACING PULSEWIDTH: 0.5 ms
Zone Setting Detection Interval: 260 ms
Zone Setting Detection Interval: 300 ms
Zone Setting Detection Interval: 350 ms

## 2014-03-22 ENCOUNTER — Telehealth: Payer: Self-pay | Admitting: Internal Medicine

## 2014-03-22 ENCOUNTER — Ambulatory Visit (INDEPENDENT_AMBULATORY_CARE_PROVIDER_SITE_OTHER): Payer: Medicare Other | Admitting: General Practice

## 2014-03-22 DIAGNOSIS — Z5181 Encounter for therapeutic drug level monitoring: Secondary | ICD-10-CM

## 2014-03-22 LAB — POCT INR: INR: 2.6

## 2014-03-22 NOTE — Progress Notes (Signed)
Pre visit review using our clinic review tool, if applicable. No additional management support is needed unless otherwise documented below in the visit note. 

## 2014-03-22 NOTE — Telephone Encounter (Signed)
New message      Talk to Wilber Oliphant would not tell me what she wanted

## 2014-03-22 NOTE — Telephone Encounter (Signed)
Patient calls in stating she is not urinating like she used to.  She also reports slight edema in her legs and ankles.  Weight is 150 lb, denies wt gain.   She does say that her stomach has stopped hurting since stopping the aldactone. States that stomach is bloated and has appt with Dr. Nita Sickle 4/18. She is currently taking lasix 40 mg a.m. And 60 mg p.m. She would like to know what other options she may have and concerned of decreased urination. Informed I would have Dr. Caryl Comes review this and call her back. She is so grateful, thanked me numerous times, and agreeable to plan.

## 2014-03-23 MED ORDER — TORSEMIDE 20 MG PO TABS: 20.0000 mg | ORAL_TABLET | Freq: Two times a day (BID) | ORAL | Status: DC

## 2014-03-23 NOTE — Telephone Encounter (Signed)
Patient advised to stop Furosemide and start torsemide 20 mg BID. Patient will call me next week to let me know how she is responding to new medication and we agreed to schedule lab work after that. Patient verbalized understanding and agreeable to plan.

## 2014-03-29 ENCOUNTER — Telehealth: Payer: Self-pay | Admitting: Internal Medicine

## 2014-03-29 DIAGNOSIS — Z79899 Other long term (current) drug therapy: Secondary | ICD-10-CM

## 2014-03-29 NOTE — Telephone Encounter (Signed)
New message ° ° ° ° ° ° °Pt returning nurse call  °

## 2014-03-29 NOTE — Telephone Encounter (Signed)
Advised to have BMET before any decisions are made on changing therapies. Patient agreed to come by the office on Monday. I will call patient Tuesday to determine next step in plan of care. Patient verbalized understanding and agreeable to plan.

## 2014-03-29 NOTE — Telephone Encounter (Signed)
Patient tells me she was calling me to update [place] after starting new medication (Torsemide). She tells me that she has only seen a slight improvement in urination - stressing that it was a slight improvement. She states she is urinating like if she were still taking lasix 100 mg. She also tells me that her belly is feeling much better and has no bloating.  Her GI issues are resolved at the moment. Would like to know what Dr. Caryl Comes thinks/advisement. She understands I will call her back after discussing with him.

## 2014-04-02 ENCOUNTER — Other Ambulatory Visit: Payer: Self-pay | Admitting: Family Medicine

## 2014-04-02 ENCOUNTER — Other Ambulatory Visit: Payer: Self-pay | Admitting: *Deleted

## 2014-04-02 ENCOUNTER — Other Ambulatory Visit (INDEPENDENT_AMBULATORY_CARE_PROVIDER_SITE_OTHER): Payer: Medicare Other | Admitting: *Deleted

## 2014-04-02 DIAGNOSIS — Z79899 Other long term (current) drug therapy: Secondary | ICD-10-CM

## 2014-04-02 LAB — BASIC METABOLIC PANEL
BUN: 40 mg/dL — AB (ref 6–23)
CALCIUM: 9.5 mg/dL (ref 8.4–10.5)
CO2: 27 meq/L (ref 19–32)
CREATININE: 1.91 mg/dL — AB (ref 0.40–1.20)
Chloride: 108 mEq/L (ref 96–112)
GFR: 26.86 mL/min — AB (ref >60.00)
GLUCOSE: 66 mg/dL — AB (ref 70–99)
Potassium: 4 mEq/L (ref 3.5–5.1)
Sodium: 142 mEq/L (ref 135–145)

## 2014-04-04 ENCOUNTER — Ambulatory Visit (INDEPENDENT_AMBULATORY_CARE_PROVIDER_SITE_OTHER): Payer: Medicare Other | Admitting: Cardiology

## 2014-04-04 ENCOUNTER — Encounter: Payer: Self-pay | Admitting: Cardiology

## 2014-04-04 VITALS — BP 124/64 | HR 79 | Ht 63.0 in | Wt 152.0 lb

## 2014-04-04 DIAGNOSIS — I10 Essential (primary) hypertension: Secondary | ICD-10-CM

## 2014-04-04 DIAGNOSIS — I421 Obstructive hypertrophic cardiomyopathy: Secondary | ICD-10-CM

## 2014-04-04 DIAGNOSIS — N289 Disorder of kidney and ureter, unspecified: Secondary | ICD-10-CM

## 2014-04-04 DIAGNOSIS — N183 Chronic kidney disease, stage 3 unspecified: Secondary | ICD-10-CM

## 2014-04-04 DIAGNOSIS — I480 Paroxysmal atrial fibrillation: Secondary | ICD-10-CM | POA: Diagnosis not present

## 2014-04-04 DIAGNOSIS — IMO0001 Reserved for inherently not codable concepts without codable children: Secondary | ICD-10-CM

## 2014-04-04 DIAGNOSIS — I4891 Unspecified atrial fibrillation: Secondary | ICD-10-CM

## 2014-04-04 DIAGNOSIS — Z95810 Presence of automatic (implantable) cardiac defibrillator: Secondary | ICD-10-CM | POA: Diagnosis not present

## 2014-04-04 DIAGNOSIS — Z0389 Encounter for observation for other suspected diseases and conditions ruled out: Secondary | ICD-10-CM

## 2014-04-04 NOTE — Assessment & Plan Note (Signed)
Recent SCr up to 1.9, her diuretics were cut back

## 2014-04-04 NOTE — Patient Instructions (Signed)
Your physician recommends that you continue on your current medications as directed. Please refer to the Current Medication list given to you today.    Lab in one week

## 2014-04-04 NOTE — Progress Notes (Signed)
04/04/2014 Sandra [name]   10-15-1932  [id number]  Primary Physician Garret Reddish, MD Primary Cardiologist: Dr Caryl Comes  HPI:  Pleasant 81 y/o female, previously followed by Dr Melvern Banker, with a history of AFcontrolled with Tikosyn, declines Amiodarone secondary to fear of side effects), HOCM with LVD, last EF 20% Oct 2015, St Jude BiV ICD 2012, and chronic renal insufficiency. She is seen in the office today as an add on. Recently her diuretics were changed from Lasix and Aldactone to Torsemide. A BMP was done 04/02/14 and her SCr had risen to 1.91 with a BUN of 40. Baseline SCr 1.6. She was called and told to cut her Torsemide back to 20 mg BID (I was unable to locate this phone note). This weekend she had mild dyspnea and felt like she was back in AF. I saw her today in the office. She admits she feels OK, not really SOB and she knows she has gone in and out of AF in the past "it always goes back". She was really concerned about her renal function. She thought the BUN of 40 meant she was in renal failure.  Her baseline wgt is about 150 and she is 152 today in her street clothes.     Current Outpatient Prescriptions  Medication Sig Dispense Refill  . furosemide (LASIX) 40 MG tablet     . albuterol (PROVENTIL HFA;VENTOLIN HFA) 108 (90 BASE) MCG/ACT inhaler Inhale 2 puffs into the lungs every 6 (six) hours as needed for wheezing or shortness of breath. (Patient not taking: Reported on 01/18/2014) 1 Inhaler 0  . benazepril (LOTENSIN) 10 MG tablet TAKE ONE TABLET BY MOUTH TWICE DAILY 180 tablet 2  . carvedilol (COREG) 12.5 MG tablet Take 1 tablet (12.5 mg total) by mouth 2 (two) times daily with a meal. 180 tablet 0  . clorazepate (TRANXENE) 7.5 MG tablet TAKE ONE TABLET BY MOUTH TWICE DAILY AS NEEDED 60 tablet 0  . dofetilide (TIKOSYN) 125 MCG capsule Take 1 capsule (125 mcg total) by mouth 2 (two) times daily. Takes with 267mcg cpasule to = 340mcg/dose 180 capsule 3  . dofetilide (TIKOSYN) 250 MCG  capsule Take 1 capsule (250 mcg total) by mouth 2 (two) times daily. 180 capsule 2  . fluticasone (FLONASE) 50 MCG/ACT nasal spray USE TWO SPRAY(S) IN EACH NOSTRIL ONCE DAILY 16 g 0  . hydrocortisone (ANUSOL-HC) 2.5 % rectal cream Place 1 application rectally 2 (two) times daily. (Patient not taking: Reported on 02/20/2014) 30 g 0  . predniSONE (DELTASONE) 20 MG tablet Take 2 tablets for 3 days, 1 tablet for 3 days, 1/2 tablet for 3 days then stop 12 tablet 0  . spironolactone (ALDACTONE) 25 MG tablet Take 0.5 tablets (12.5 mg total) by mouth daily. 30 tablet 3  . SYNTHROID 100 MCG tablet TAKE ONE TABLET BY MOUTH DAILY BEFORE BREAKFAST. 90 tablet 1  . torsemide (DEMADEX) 20 MG tablet Take 1 tablet (20 mg total) by mouth daily. 30 tablet 3  . vitamin E 400 UNIT capsule Take 400 Units by mouth every other day.     . warfarin (COUMADIN) 5 MG tablet TAKE AS DIRECTED 90 tablet 1  . ZETIA 10 MG tablet TAKE ONE TABLET BY MOUTH ONCE DAILY 30 tablet 5   No current facility-administered medications for this visit.    Allergies  Allergen Reactions  . Levofloxacin Other (See Comments)    Due to cardiac  AF  . Procaine Hcl Other (See Comments)    REACTION:  smothers  . Amoxicillin Other (See Comments)    Rash   . Codeine Other (See Comments)    REACTION: nausea  . Niacin Other (See Comments)    REACTION: rash  . Ramipril Other (See Comments)    REACTION: unspecified  . Statins Other (See Comments)    REACTION: muscle aches    History   Social History  . Marital Status: Married    Spouse Name: N/A  . Number of Children: 3  . Years of Education: N/A   Occupational History  . Retired     Probation officer   Social History Main Topics  . Smoking status: Former Smoker -- 0.20 packs/day for 19 years    Types: Cigarettes    Quit date: 01/13/1987  . Smokeless tobacco: Never Used  . Alcohol Use: No  . Drug Use: No  . Sexual Activity: Not Currently   Other Topics Concern  . Not on file    Social History Narrative   Married 1953 (husband Sandra [name] in our Network engineer). 3 girls (1 is a patient here-Sandra [name]). 2 grandkids.       Retired in [place]: very active, going to church and church friends, movies     Review of Systems: General: negative for chills, fever, night sweats or weight changes.  Cardiovascular: negative for chest pain, dyspnea on exertion, edema, orthopnea, palpitations, paroxysmal nocturnal dyspnea or shortness of breath Dermatological: negative for rash Respiratory: negative for cough or wheezing Urologic: negative for hematuria Abdominal: negative for nausea, vomiting, diarrhea, bright red blood per rectum, melena, or hematemesis Neurologic: negative for visual changes, syncope, or dizziness All other systems reviewed and are otherwise negative except as noted above.    Blood pressure 124/64, pulse 79, height 5\' 3"  (1.6 m), weight 152 lb (68.947 kg).  General appearance: alert, cooperative and no distress Lungs: faint crackles Lt base Heart: irregularly irregular rhythm Extremities: no edema  EKG AF with CVR, POD  ASSESSMENT AND PLAN:   Atrial fibrillation Tikosyn, coreg 12.5 mg BID, coumadin clinic [facility]. Back in AF today   Biventricular implantable cardioverter-defibrillator -St. Jude St Jude BiV 2012   Chronic kidney disease, stage III (moderate) Recent SCr up to 1.9, her diuretics were cut back   Hypertrophic obstructive cardiomyopathy Last EF 20%, Oct 2015   Essential hypertension, benign Controlled   Normal coronary arteries 2004 .   PLAN  I discussed her case with Dr Caryl Comes, he agrees, she usually goes back into NSR on her own. I reassured her about her renal function. She'll need a BMP in a week. I suggested she take an extra Torsemide if her wgt goes up 5 lbs or more.   Egan Berkheimer KPA-C 04/04/2014 2:01 PM

## 2014-04-04 NOTE — Assessment & Plan Note (Signed)
St Jude BiV 2012

## 2014-04-04 NOTE — Telephone Encounter (Signed)
Patient saw a PA in our office today

## 2014-04-04 NOTE — Assessment & Plan Note (Signed)
Tikosyn, coreg 12.5 mg BID, coumadin clinic [facility]. Back in AF today

## 2014-04-04 NOTE — Assessment & Plan Note (Signed)
Controlled.  

## 2014-04-04 NOTE — Assessment & Plan Note (Signed)
Last EF 20%, Oct 2015

## 2014-04-09 ENCOUNTER — Telehealth: Payer: Self-pay | Admitting: Internal Medicine

## 2014-04-09 DIAGNOSIS — Z79899 Other long term (current) drug therapy: Secondary | ICD-10-CM

## 2014-04-09 NOTE — Telephone Encounter (Signed)
New Message   Patient is in Pain, extreme pain in the bottom of her stomach. She thinks that it has something to do with the medication ( [place])  Please Call.

## 2014-04-09 NOTE — Telephone Encounter (Signed)
Patient answers phone tearful.  She states she is tired and almost went to the ED because the pain in the bottom of her stomach hurts and this pain has been occuring since Thursday. She does report 1 lb weight gain overnight.  States that she does not urinate as she did on Lasix. She reports normal bowel movement this morning.  Denies gas/diarrhea. This pain feels like when she was on Aldactone, which possibly flared diverticulitis. She would like Dr. Olin Pia input on this and hopeful for a resolution. Patient advised that Dr. Caryl Comes is in the hospital today, but that I would forward this information to him for his recommendations. She is agreeable to this plan.

## 2014-04-10 NOTE — Telephone Encounter (Signed)
Called patient to see how she was doing today. She states she is feeling better today, taking Tylenol. She still isn't urinating like she was on Lasix. (she told me that her mother has to be on the brand Lasix and not generic - this worked for her mother) She did pass a lot of gas yesterday. She has a repeat lab tomorrow to check kidney function (it was Creatinine 1.9 on 3/21). Advised patient Dr. Caryl Comes will probably wait until repeat lab work results to determine course of action. Patient verbalized understanding and agreeable to plan.

## 2014-04-11 ENCOUNTER — Telehealth: Payer: Self-pay | Admitting: *Deleted

## 2014-04-11 ENCOUNTER — Other Ambulatory Visit (INDEPENDENT_AMBULATORY_CARE_PROVIDER_SITE_OTHER): Payer: Medicare Other | Admitting: *Deleted

## 2014-04-11 DIAGNOSIS — N289 Disorder of kidney and ureter, unspecified: Secondary | ICD-10-CM | POA: Diagnosis not present

## 2014-04-11 LAB — BASIC METABOLIC PANEL
BUN: 33 mg/dL — ABNORMAL HIGH (ref 6–23)
CO2: 29 mEq/L (ref 19–32)
Calcium: 9.7 mg/dL (ref 8.4–10.5)
Chloride: 105 mEq/L (ref 96–112)
Creatinine, Ser: 1.52 mg/dL — ABNORMAL HIGH (ref 0.40–1.20)
GFR: 34.96 mL/min — ABNORMAL LOW (ref >60.00)
Glucose, Bld: 86 mg/dL (ref 70–99)
Potassium: 4.2 mEq/L (ref 3.5–5.1)
Sodium: 138 mEq/L (ref 135–145)

## 2014-04-11 NOTE — Telephone Encounter (Signed)
Faxed tier cost sharing request form to [facility]

## 2014-04-12 NOTE — Telephone Encounter (Signed)
Dr. Caryl Comes reviewed lab work from yesterday. Advised to increase Torsemide to 30 mg daily to see if improvement. Informed her that I would call her next week to follow up on how she is doing. Patient verbalized understanding and agreeable to plan.

## 2014-04-17 ENCOUNTER — Encounter: Payer: Self-pay | Admitting: Cardiology

## 2014-04-18 ENCOUNTER — Other Ambulatory Visit: Payer: Self-pay

## 2014-04-18 MED ORDER — CARVEDILOL 12.5 MG PO TABS: 12.5000 mg | ORAL_TABLET | Freq: Two times a day (BID) | ORAL | Status: DC

## 2014-04-19 NOTE — Telephone Encounter (Signed)
Checking in with patient. She reports feeling somewhat better. She is requesting to check BMET next week - advised patient I will schedule this. We agreed to speak week of 18th, after she sees GI physician.

## 2014-04-27 ENCOUNTER — Other Ambulatory Visit (INDEPENDENT_AMBULATORY_CARE_PROVIDER_SITE_OTHER): Payer: Medicare Other | Admitting: *Deleted

## 2014-04-27 DIAGNOSIS — Z79899 Other long term (current) drug therapy: Secondary | ICD-10-CM

## 2014-04-27 LAB — BASIC METABOLIC PANEL
BUN: 38 mg/dL — ABNORMAL HIGH (ref 6–23)
CHLORIDE: 106 meq/L (ref 96–112)
CO2: 25 meq/L (ref 19–32)
Calcium: 9.6 mg/dL (ref 8.4–10.5)
Creatinine, Ser: 1.66 mg/dL — ABNORMAL HIGH (ref 0.40–1.20)
GFR: 31.57 mL/min — ABNORMAL LOW (ref >60.00)
Glucose, Bld: 55 mg/dL — ABNORMAL LOW (ref 70–99)
POTASSIUM: 4.1 meq/L (ref 3.5–5.1)
Sodium: 141 mEq/L (ref 135–145)

## 2014-04-30 ENCOUNTER — Encounter: Payer: Self-pay | Admitting: Internal Medicine

## 2014-04-30 ENCOUNTER — Ambulatory Visit (INDEPENDENT_AMBULATORY_CARE_PROVIDER_SITE_OTHER): Payer: Medicare Other | Admitting: Internal Medicine

## 2014-04-30 VITALS — BP 112/60 | HR 76 | Ht 63.0 in | Wt 151.5 lb

## 2014-04-30 DIAGNOSIS — T887XXA Unspecified adverse effect of drug or medicament, initial encounter: Secondary | ICD-10-CM

## 2014-04-30 DIAGNOSIS — R1032 Left lower quadrant pain: Secondary | ICD-10-CM | POA: Diagnosis not present

## 2014-04-30 DIAGNOSIS — R1031 Right lower quadrant pain: Secondary | ICD-10-CM

## 2014-04-30 DIAGNOSIS — R195 Other fecal abnormalities: Secondary | ICD-10-CM

## 2014-04-30 DIAGNOSIS — T50905A Adverse effect of unspecified drugs, medicaments and biological substances, initial encounter: Secondary | ICD-10-CM

## 2014-04-30 NOTE — Progress Notes (Signed)
   Subjective:    Patient ID: Sandra [name], female    DOB: 10-08-1932, 81 y.o.   MRN: [id number] Chief complaint abdominal pain and diarrhea HPI Patient is a very nice lady who was in her usual state of health without problems and so she started magnesium supplementation. She was having some loose stools. Despite reducing the dose and frequency that persisted. Then she was started on Spironolactone by her cardiologist. After that she developed crampy lower abdominal pain and very loose soft stools. Needed for diverticulitis and ended up getting a rash related to Augmentin. She has multiple drug allergies and is difficult to sort through. That did seem to help her pain. However it recurred. . Subsequently she stopped spironolactone and the bilateral lower quadrant pain and loose stools resolved. She feels fine at this point.  Medications, allergies, past medical history, past surgical history, family history and social history are reviewed and updated in the EMR.  Review of Systems As per history of present illness also some back pain and headaches allergy problems all other review of systems are negative.    Objective:   Physical Exam @BP  112/60 mmHg  Pulse 76  Ht 5\' 3"  (1.6 m)  Wt 151 lb 8 oz (68.72 kg)  BMI 26.84 kg/m2@  General:  Well-developed, well-nourished and in no acute distress Eyes:  anicteric. Lungs: Clear to auscultation bilaterally. Heart:  S1S2, no rubs, murmurs, gallops. Abdomen:  soft, non-tender, no hepatosplenomegaly, hernia, or mass and BS+.   Data Reviewed: Last CBC in 2015, was normal. Cardiology notes are reviewed, primary care note from February reviewed. Last colonoscopy 2011 severe diverticulosis no polyps. She has a history of 2 small adenomas in 2002 none since.     Assessment & Plan:   1. Bilateral lower abdominal pain   2. Loose stools   3. Medication side effect, initial encounter    It seems like she has suffered from side effects of  spironolactone. This is resolved. I will see her back as needed. I have previously recommended that she forego routine colonoscopy given her age and reviewed that today as well.  I appreciate the opportunity to care for this patient. Cc: Jolyn Nap, MD Garret Reddish, MD

## 2014-04-30 NOTE — Patient Instructions (Signed)
Follow up as needed. CC: Garret Reddish MD

## 2014-05-03 ENCOUNTER — Ambulatory Visit (INDEPENDENT_AMBULATORY_CARE_PROVIDER_SITE_OTHER): Payer: Medicare Other | Admitting: General Practice

## 2014-05-03 DIAGNOSIS — Z5181 Encounter for therapeutic drug level monitoring: Secondary | ICD-10-CM

## 2014-05-03 LAB — POCT INR: INR: 2.1

## 2014-05-03 NOTE — Progress Notes (Signed)
Pre visit review using our clinic review tool, if applicable. No additional management support is needed unless otherwise documented below in the visit note. 

## 2014-05-08 ENCOUNTER — Telehealth: Payer: Self-pay | Admitting: Internal Medicine

## 2014-05-08 NOTE — Telephone Encounter (Signed)
Explained reasoning for f/u need secondary to creatinine variations and fluid status issues. Patient verbalized understanding and agreeable to plan.

## 2014-05-08 NOTE — Telephone Encounter (Signed)
New Message      Pt calling wanting to know why she needs to come back in to see a PA, please call back and advise.

## 2014-05-09 ENCOUNTER — Ambulatory Visit (INDEPENDENT_AMBULATORY_CARE_PROVIDER_SITE_OTHER): Payer: Medicare Other | Admitting: Nurse Practitioner

## 2014-05-09 ENCOUNTER — Encounter: Payer: Self-pay | Admitting: Nurse Practitioner

## 2014-05-09 VITALS — BP 128/72 | HR 67 | Ht 63.0 in | Wt 152.4 lb

## 2014-05-09 DIAGNOSIS — I5022 Chronic systolic (congestive) heart failure: Secondary | ICD-10-CM

## 2014-05-09 DIAGNOSIS — I421 Obstructive hypertrophic cardiomyopathy: Secondary | ICD-10-CM

## 2014-05-09 DIAGNOSIS — I4819 Other persistent atrial fibrillation: Secondary | ICD-10-CM

## 2014-05-09 DIAGNOSIS — I481 Persistent atrial fibrillation: Secondary | ICD-10-CM

## 2014-05-09 LAB — MDC_IDC_ENUM_SESS_TYPE_INCLINIC
Brady Statistic RV Percent Paced: 94 %
HighPow Impedance: 41.7912
Implantable Pulse Generator Model: 3257
Lead Channel Impedance Value: 337.5 Ohm
Lead Channel Impedance Value: 387.5 Ohm
Lead Channel Impedance Value: 512.5 Ohm
Lead Channel Pacing Threshold Amplitude: 0.875 V
Lead Channel Pacing Threshold Pulse Width: 0.5 ms
Lead Channel Sensing Intrinsic Amplitude: 1.3 mV
Lead Channel Sensing Intrinsic Amplitude: 12 mV
Lead Channel Setting Pacing Amplitude: 2 V
Lead Channel Setting Pacing Amplitude: 2.25 V
Lead Channel Setting Pacing Pulse Width: 0.5 ms
Lead Channel Setting Pacing Pulse Width: 0.5 ms
MDC IDC MSMT BATTERY REMAINING LONGEVITY: 43.2 mo
MDC IDC MSMT LEADCHNL LV PACING THRESHOLD AMPLITUDE: 0.75 V
MDC IDC MSMT LEADCHNL LV PACING THRESHOLD PULSEWIDTH: 0.5 ms
MDC IDC MSMT LEADCHNL RA PACING THRESHOLD AMPLITUDE: 1.25 V
MDC IDC MSMT LEADCHNL RA PACING THRESHOLD PULSEWIDTH: 0.5 ms
MDC IDC PG SERIAL: 7032457
MDC IDC SESS DTM: 20160427135652
MDC IDC SET LEADCHNL RV PACING AMPLITUDE: 2 V
MDC IDC SET LEADCHNL RV SENSING SENSITIVITY: 0.5 mV
MDC IDC SET ZONE DETECTION INTERVAL: 260 ms
MDC IDC SET ZONE DETECTION INTERVAL: 350 ms
MDC IDC STAT BRADY RA PERCENT PACED: 78 %
Zone Setting Detection Interval: 300 ms

## 2014-05-09 LAB — BASIC METABOLIC PANEL
BUN: 38 mg/dL — ABNORMAL HIGH (ref 6–23)
CALCIUM: 9.5 mg/dL (ref 8.4–10.5)
CO2: 30 mEq/L (ref 19–32)
CREATININE: 1.59 mg/dL — AB (ref 0.40–1.20)
Chloride: 105 mEq/L (ref 96–112)
GFR: 33.18 mL/min — AB (ref >60.00)
Glucose, Bld: 79 mg/dL (ref 70–99)
Potassium: 3.9 mEq/L (ref 3.5–5.1)
Sodium: 140 mEq/L (ref 135–145)

## 2014-05-09 LAB — MAGNESIUM: MAGNESIUM: 2.7 mg/dL — AB (ref 1.5–2.5)

## 2014-05-09 NOTE — Patient Instructions (Addendum)
Medication Instructions:  Your physician recommends that you continue on your current medications as directed. Please refer to the Current Medication list given to you today.  Labwork: BMET, Magnesium level  Testing/Procedures: None  Follow-Up: Your physician recommends that you schedule a follow-up appointment in: 6 weeks with Dr. Caryl Comes -- on 6/14 at 8:15 am   Any Other Special Instructions Will Be Listed Below (If Applicable).

## 2014-05-09 NOTE — Progress Notes (Addendum)
Electrophysiology Office Note Date: 05/09/2014  ID:  Enya, Bureau [birth year], MRN [id number]  PCP: Garret Reddish, MD Electrophysiologist: Caryl Comes  CC: CHF follow up  Sandra [name] is a 81 y.o. female is seen today for Dr Caryl Comes.  She presents today for an add-on visit to evaluate heart failure status. She was recently seen by Kerin Ransom, PA and her diuretics have been adjusted with return of renal function to baseline. She has atrial fibrillation and is maintained on Tikosyn.  Since last being seen in our clinic, the patient reports doing reasonably well. She has chronic dyspnea on exertion.  Her weights have been stable.  She does think that her symptoms were improved with Sprinolactone but she was unable to tolerate due to GI issues. She denies chest pain, PND, orthopnea, nausea, vomiting, dizziness, syncope, edema, weight gain, or early satiety.  She has not had ICD shocks.   Device History: STJ CRTD implanted 2007 for HCM and systolic heart failure.  She had pocket hematoma evacuation the day after implant; deep suture abscess with wound exploration month after implant; wound exploration 3 months after implant; generator change 2013 History of appropriate therapy: No History of AAD therapy: Yes - on Tikosyn for atrial fibrillation   Past Medical History  Diagnosis Date  . Atrial fibrillation     on Tikosyn and Coumadin  . Hyperlipidemia   . Hypothyroidism   . Bradycardia   . Cardiomyopathy, hypertrophic nonobstructive     ejection fraction 25%  . Anal fissure   . Adenomatous colon polyp   . Diverticulosis   . ICD-CRT     generator change 2013  . Systolic heart failure   . History of colonoscopy   . Migraines   . Hyperlipidemia   . Hypertension   . Cardiac arrhythmia due to congenital heart disease    Past Surgical History  Procedure Laterality Date  . Cardioversion    . Cardiac defibrillator placement  2007    pacemaker  . Vaginal hysterectomy     uterus alone  . Ovarian cyst removal      1989-benign  . Biv icd genertaor change out N/A 03/23/2011    Procedure: BIV ICD GENERTAOR CHANGE OUT;  Surgeon: Deboraha Sprang, MD;  Location: [facility] CATH LAB;  Service: Cardiovascular;  Laterality: N/A;  . Colonoscopy      Current Outpatient Prescriptions  Medication Sig Dispense Refill  . albuterol (PROVENTIL HFA;VENTOLIN HFA) 108 (90 BASE) MCG/ACT inhaler Inhale 2 puffs into the lungs every 6 (six) hours as needed for wheezing or shortness of breath. 1 Inhaler 0  . benazepril (LOTENSIN) 10 MG tablet TAKE ONE TABLET BY MOUTH TWICE DAILY 180 tablet 2  . carvedilol (COREG) 12.5 MG tablet Take 1 tablet (12.5 mg total) by mouth 2 (two) times daily with a meal. 180 tablet 1  . clorazepate (TRANXENE) 7.5 MG tablet Take 7.5 mg by mouth 2 (two) times daily as needed for anxiety (for anxiety).    . dofetilide (TIKOSYN) 125 MCG capsule Take 1 capsule (125 mcg total) by mouth 2 (two) times daily. Takes with 264mcg cpasule to = 327mcg/dose 180 capsule 3  . dofetilide (TIKOSYN) 250 MCG capsule Take 1 capsule (250 mcg total) by mouth 2 (two) times daily. 180 capsule 2  . ezetimibe (ZETIA) 10 MG tablet Take 10 mg by mouth 3 (three) times a week.    . fluticasone (FLONASE) 50 MCG/ACT nasal spray USE TWO SPRAY(S) IN EACH NOSTRIL ONCE DAILY 16  g 0  . hydrocortisone (ANUSOL-HC) 2.5 % rectal cream Place 1 application rectally 2 (two) times daily. 30 g 0  . SYNTHROID 100 MCG tablet TAKE ONE TABLET BY MOUTH DAILY BEFORE BREAKFAST. 90 tablet 1  . torsemide (DEMADEX) 20 MG tablet Take 30 mg by mouth daily. 30 tablet 3  . vitamin E 400 UNIT capsule Take 400 Units by mouth every other day.     . warfarin (COUMADIN) 5 MG tablet TAKE AS DIRECTED 90 tablet 1   No current facility-administered medications for this visit.    Allergies:   Levofloxacin; Procaine hcl; Spironolactone; Amoxicillin; Codeine; Niacin; Ramipril; and Statins   Social History: History   Social History    . Marital Status: Married    Spouse Name: N/A  . Number of Children: 3  . Years of Education: N/A   Occupational History  . Retired     Probation officer   Social History Main Topics  . Smoking status: Former Smoker -- 0.20 packs/day for 19 years    Types: Cigarettes    Quit date: 01/13/1987  . Smokeless tobacco: Never Used  . Alcohol Use: No  . Drug Use: No  . Sexual Activity: Not Currently   Other Topics Concern  . Not on file   Social History Narrative   Married 1953 (husband Clare Gandy in our Network engineer). 3 girls (1 is a patient here-[name]). 2 grandkids.       Retired in [place]: very active, going to church and church friends, movies    Family History: Family History  Problem Relation Age of Onset  . Heart disease Mother   . Colon cancer Father   . COPD Father   . Heart disease Father   . Cardiomyopathy Daughter   . Breast cancer Paternal Aunt   . Colon polyps Father   . Diabetes Neg Hx   . Kidney disease Neg Hx     Review of Systems: All other systems reviewed and are otherwise negative except as noted above.   Physical Exam: VS:  BP 128/72 mmHg  Pulse 67  Ht 5\' 3"  (1.6 m)  Wt 152 lb 6.4 oz (69.128 kg)  BMI 27.00 kg/m2 , BMI Body mass index is 27 kg/(m^2).  GEN- The patient is elderly appearing, alert and oriented x 3 today.   HEENT: normocephalic, atraumatic; sclera clear, conjunctiva pink; hearing intact; oropharynx clear; neck supple, no JVP Lymph- no cervical lymphadenopathy Lungs- Clear to ausculation bilaterally, normal work of breathing.  No wheezes, rales, rhonchi Heart- Regular rate and rhythm (paced) GI- soft, non-tender, non-distended, bowel sounds present  Extremities- no clubbing, cyanosis, or edema; DP/PT/radial pulses 1+ bilaterally MS- no significant deformity or atrophy Skin- warm and dry, no rash or lesion; ICD pocket well healed Psych- euthymic mood, full affect Neuro- strength and sensation are intact  ICD  interrogation- reviewed in detail today,  See PACEART report  EKG:  EKG is not ordered today.  Recent Labs: 10/16/2013: ALT 25; Hemoglobin 12.4; Platelets 182.0; TSH 0.41 04/27/2014: BUN 38*; Creatinine 1.66*; Potassium 4.1; Sodium 141   Wt Readings from Last 3 Encounters:  05/09/14 152 lb 6.4 oz (69.128 kg)  04/30/14 151 lb 8 oz (68.72 kg)  04/04/14 152 lb (68.947 kg)     Assessment and Plan:  1.  Chronic systolic dysfunction euvolemic today Stable on an appropriate medical regimen Normal ICD function See Pace Art report No changes today Recheck BMET today, Mg today  2.  Persistent atrial fibrillation Continue Tikosyn AF burden on device interrogation today is 20% which is stable for the patient Continue Warfarin for CHADS2VASC score of at least 5  3.  HCM Continue medical therapy  Current medicines are reviewed at length with the patient today.   The patient does not have concerns regarding her medicines.  The following changes were made today:  none  Labs/ tests ordered today include: BMET, Mg   Disposition:   Follow up with Dr Caryl Comes in 6 weeks    Signed, Chanetta Marshall, NP 05/09/2014 1:23 PM  [facility] [address] [place] [address] [phone] (office) [phone] (fax)

## 2014-05-14 ENCOUNTER — Telehealth: Payer: Self-pay | Admitting: *Deleted

## 2014-05-14 NOTE — Telephone Encounter (Signed)
Patient called in regards to her torsemide dose. She stated that she has been taking 20mg  bid and that is what is on her bottle from her last refill. Her chart has 30mg  qd. She stated that yesterday she actually took and extra 10mg  in addition to the 40mg  and she felt that was helpful. She would like to know how she should be taking this and if it is ok to take an extra 10mg  prn. Please advise as she is out of the medication. Thanks, MI

## 2014-05-16 ENCOUNTER — Other Ambulatory Visit: Payer: Self-pay | Admitting: Internal Medicine

## 2014-05-17 NOTE — Telephone Encounter (Signed)
She has been adjucting her torsemide on her own with 'Sherri I cnat tell from the phone note how much she is taking ie bid or qd,  Lets get bmet and clarify what she is taking,  The last I  KNEW was 30 mg bid ( i think)

## 2014-05-21 ENCOUNTER — Telehealth: Payer: Self-pay | Admitting: Internal Medicine

## 2014-05-21 DIAGNOSIS — Z79899 Other long term (current) drug therapy: Secondary | ICD-10-CM

## 2014-05-21 NOTE — Telephone Encounter (Signed)
New message      Want Sandra [name] to know she got her medication.  However, she did not understand the message left on her ans machine----it was all "garbbled".  If you need her, please call back

## 2014-05-22 NOTE — Telephone Encounter (Signed)
Left pt a message . Pt wants to talk with Sherri regarding a message left in her answer service by Sandra [name].

## 2014-05-22 NOTE — Telephone Encounter (Signed)
Follow Up ° ° ° ° ° ° ° ° °Pt returning Sherri's phone call.  °

## 2014-05-23 ENCOUNTER — Other Ambulatory Visit (INDEPENDENT_AMBULATORY_CARE_PROVIDER_SITE_OTHER): Payer: Medicare Other

## 2014-05-23 DIAGNOSIS — Z79899 Other long term (current) drug therapy: Secondary | ICD-10-CM

## 2014-05-23 LAB — BASIC METABOLIC PANEL
BUN: 51 mg/dL — ABNORMAL HIGH (ref 6–23)
CALCIUM: 10 mg/dL (ref 8.4–10.5)
CO2: 27 mEq/L (ref 19–32)
Chloride: 102 mEq/L (ref 96–112)
Creatinine, Ser: 2.18 mg/dL — ABNORMAL HIGH (ref 0.40–1.20)
GFR: 23.05 mL/min — ABNORMAL LOW (ref >60.00)
Glucose, Bld: 153 mg/dL — ABNORMAL HIGH (ref 70–99)
Potassium: 4.1 mEq/L (ref 3.5–5.1)
Sodium: 139 mEq/L (ref 135–145)

## 2014-05-23 NOTE — Telephone Encounter (Signed)
Confirmed patient is taking Torsemide 40 mg daily in the evening. Patient to come by office today for BMET f/u. Patient also aware scheduler will call her this morning to arrange 6 week f/u with Dr. Caryl Comes. She and I agreed to speak next week about care plan and how it is going.

## 2014-05-23 NOTE — Addendum Note (Signed)
Addended by: Stanton Kidney on: 05/23/2014 09:02 AM   Modules accepted: Orders

## 2014-05-25 ENCOUNTER — Other Ambulatory Visit: Payer: Self-pay | Admitting: *Deleted

## 2014-05-25 DIAGNOSIS — I5022 Chronic systolic (congestive) heart failure: Secondary | ICD-10-CM

## 2014-06-01 ENCOUNTER — Other Ambulatory Visit (INDEPENDENT_AMBULATORY_CARE_PROVIDER_SITE_OTHER): Payer: Medicare Other | Admitting: *Deleted

## 2014-06-01 DIAGNOSIS — I5022 Chronic systolic (congestive) heart failure: Secondary | ICD-10-CM

## 2014-06-01 LAB — BASIC METABOLIC PANEL
BUN: 30 mg/dL — ABNORMAL HIGH (ref 6–23)
CHLORIDE: 105 meq/L (ref 96–112)
CO2: 29 meq/L (ref 19–32)
Calcium: 9.1 mg/dL (ref 8.4–10.5)
Creatinine, Ser: 1.64 mg/dL — ABNORMAL HIGH (ref 0.40–1.20)
GFR: 32.01 mL/min — ABNORMAL LOW (ref >60.00)
GLUCOSE: 74 mg/dL (ref 70–99)
POTASSIUM: 4 meq/L (ref 3.5–5.1)
SODIUM: 142 meq/L (ref 135–145)

## 2014-06-05 ENCOUNTER — Encounter: Payer: Self-pay | Admitting: Internal Medicine

## 2014-06-12 ENCOUNTER — Encounter: Payer: Self-pay | Admitting: Internal Medicine

## 2014-06-12 ENCOUNTER — Telehealth: Payer: Self-pay | Admitting: Internal Medicine

## 2014-06-12 ENCOUNTER — Ambulatory Visit (INDEPENDENT_AMBULATORY_CARE_PROVIDER_SITE_OTHER): Payer: Medicare Other | Admitting: Internal Medicine

## 2014-06-12 VITALS — BP 120/70 | HR 70 | Ht 63.0 in | Wt 153.2 lb

## 2014-06-12 DIAGNOSIS — I481 Persistent atrial fibrillation: Secondary | ICD-10-CM | POA: Diagnosis not present

## 2014-06-12 DIAGNOSIS — Z79899 Other long term (current) drug therapy: Secondary | ICD-10-CM

## 2014-06-12 DIAGNOSIS — I5022 Chronic systolic (congestive) heart failure: Secondary | ICD-10-CM

## 2014-06-12 DIAGNOSIS — Z4502 Encounter for adjustment and management of automatic implantable cardiac defibrillator: Secondary | ICD-10-CM

## 2014-06-12 DIAGNOSIS — I4819 Other persistent atrial fibrillation: Secondary | ICD-10-CM

## 2014-06-12 MED ORDER — FUROSEMIDE 40 MG PO TABS: 40.0000 mg | ORAL_TABLET | Freq: Two times a day (BID) | ORAL | Status: DC

## 2014-06-12 NOTE — Telephone Encounter (Signed)
New Message   Pt feels like she went out of rhythm on thurs 5/26, Friday woke up and slept in chair to help fluid problem. Pt says- last night took fluid pill and felt better. Per pt- feels like medicine (trosemide 20 mg) change in Oct/2015 has had problems since.   Pt c/o Shortness Of Breath: STAT if SOB developed within the last 24 hours or pt is noticeably SOB on the phone  1. Are you currently SOB (can you hear that pt is SOB on the phone)? Not right now  2. How long have you been experiencing SOB? Since Thursday 5/26  3. Are you SOB when sitting or when up moving around? Worse when lying down- Up until last night-   4. Are you currently experiencing any other symptoms? Chest Tingle - not sure if chest pain

## 2014-06-12 NOTE — Patient Instructions (Addendum)
Medication Instructions:  Your physician has recommended you make the following change in your medication:  1) STOP Torsemide 2) Re-Start Furosemide -- take 40 mg twice daily  Labwork:  Your physician recommends that you return for lab work in 2 weeks: for BMET  Testing/Procedures: None ordered  Follow-Up: Your physician recommends that you schedule a follow-up appointment in: 4 weeks with Chanetta Marshall, NP   Thank you for choosing [facility]!!

## 2014-06-12 NOTE — Progress Notes (Signed)
Patient Care Team: Marin Olp, MD as PCP - General (Family Medicine) Neldon Mc, MD (General Surgery)   HPI  Sandra [name] is a 81 y.o. female seen in followup for congestive heart failure in the setting of hypertrophic cardiomyopathy with depressed left ventricular function. She is status post CRT-D implantation.  She is on Coumadin. Because of recurrences of atrial fibrillation she is also a put on dofetilide    She is troubled with recurrent episodes of atrial fibrillation which have been quite symptomatic.    We have in the past discussed amiodarone versus AV junction ablation. She has been reluctant to consider amiodarone because of the side effects.  Interval echo 10/15 demonstrated RV dysfunction and biatrial enlargement (LAE-2.54) with an LVEF 20-25%  She's been seen a couple of times by Lurena Joiner and by [facility] with concerns of renal insufficiency aggravated by over diuresis (creatinine 1.5-2.2 and most recently back to 1.64.)  Notably also she did not tolerate Aldactone secondary to abdominal pain. We had tried torsemide because much of her heart failure seemed to be right-sided.   Last echocardiogram 30% 10/13 followed by AV optimization 2/14  Past Medical History  Diagnosis Date  . Atrial fibrillation     on Tikosyn and Coumadin  . Hyperlipidemia   . Hypothyroidism   . Bradycardia   . Cardiomyopathy, hypertrophic nonobstructive     ejection fraction 25%  . Anal fissure   . Adenomatous colon polyp   . Diverticulosis   . ICD-CRT     generator change 2013  . Systolic heart failure   . History of colonoscopy   . Migraines   . Hyperlipidemia   . Hypertension   . Cardiac arrhythmia due to congenital heart disease     Past Surgical History  Procedure Laterality Date  . Cardioversion    . Cardiac defibrillator placement  2007    pacemaker  . Vaginal hysterectomy      uterus alone  . Ovarian cyst removal      1989-benign  . Biv icd genertaor  change out N/A 03/23/2011    Procedure: BIV ICD GENERTAOR CHANGE OUT;  Surgeon: Deboraha Sprang, MD;  Location: [facility] CATH LAB;  Service: Cardiovascular;  Laterality: N/A;  . Colonoscopy      Current Outpatient Prescriptions  Medication Sig Dispense Refill  . albuterol (PROVENTIL HFA;VENTOLIN HFA) 108 (90 BASE) MCG/ACT inhaler Inhale 2 puffs into the lungs every 6 (six) hours as needed for wheezing or shortness of breath. 1 Inhaler 0  . benazepril (LOTENSIN) 10 MG tablet TAKE ONE TABLET BY MOUTH TWICE DAILY 180 tablet 2  . carvedilol (COREG) 12.5 MG tablet Take 1 tablet (12.5 mg total) by mouth 2 (two) times daily with a meal. 180 tablet 1  . clorazepate (TRANXENE) 7.5 MG tablet Take 7.5 mg by mouth 2 (two) times daily as needed for anxiety (for anxiety).    . dofetilide (TIKOSYN) 250 MCG capsule Take 1 capsule (250 mcg total) by mouth 2 (two) times daily. 180 capsule 2  . ezetimibe (ZETIA) 10 MG tablet Take 10 mg by mouth 3 (three) times a week.    . fluticasone (FLONASE) 50 MCG/ACT nasal spray USE TWO SPRAY(S) IN EACH NOSTRIL ONCE DAILY 16 g 0  . hydrocortisone 2.5 % cream Apply 1 application topically 2 (two) times daily as needed (had fissure, for that scar tissue, per pt.).    Marland Kitchen SYNTHROID 100 MCG tablet TAKE ONE TABLET BY MOUTH DAILY BEFORE  BREAKFAST. 90 tablet 1  . torsemide (DEMADEX) 20 MG tablet Take 1 tablet (20 mg total) by mouth daily.    . vitamin E 400 UNIT capsule Take 400 Units by mouth every other day.     . warfarin (COUMADIN) 5 MG tablet TAKE AS DIRECTED 90 tablet 1   No current facility-administered medications for this visit.    Allergies  Allergen Reactions  . Levofloxacin Other (See Comments)    Due to cardiac  AF  . Procaine Hcl Other (See Comments)    REACTION: smothers  . Spironolactone Diarrhea    Abdominal pain  . Amoxicillin Other (See Comments)    Rash   . Codeine Other (See Comments)    REACTION: nausea  . Niacin Other (See Comments)    REACTION: rash  .  Ramipril Other (See Comments)    REACTION: unspecified  . Statins Other (See Comments)    REACTION: muscle aches    Review of Systems negative except from HPI and PMH  Physical Exam BP 120/70 mmHg  Pulse 70  Ht 5\' 3"  (1.6 m)  Wt 153 lb 3.2 oz (69.491 kg)  BMI 27.14 kg/m2 Well developed and well nourished in no acute distress HENT normal E scleral and icterus clear Neck Supple JVP  greater than 10 cm  carotids brisk and full Clear to ausculation Device pocket well healed; without hematoma or erythema.  There is no tethering   Regular rate and rhythm, no murmurs gallops or rub Soft with active bowel sounds No clubbing cyanosis  trace  Edema Alert and oriented, grossly normal motor and sensory function Skin Warm and Dry  ECG demonstrates AV pacing Interval 24/17/51 Axis leftward Assessment and  Plan  Hypertrophic cardiomyopathy  Left ventricular dysfunction  Implantable defibrillator-CRT-[employer] Jude  The patient's device was interrogated.  The information was reviewed. The AV delay was shortened on a paced AV 250--200  Atrial fibrillation -on dofetilide  Congestive heart failure-chronic-systolic     She is significantly improved and is euvolemic. Her atrial fibrillation burden is stable.   We had a lengthy discussion regarding strategies of diuresis. There is a balance between renal function and abdominal distention i.e. cardiorenal syndrome. She would like at this juncture to return to furosemide and we'll start at 40 twice daily. I will have her see AS in 2 weeks with a metabolic profile   She is NOT inclined at this point to go to CHF clinic

## 2014-06-12 NOTE — Telephone Encounter (Signed)
Reports going out of rhythm Thursday and back in sinus sometime on Friday.  She states she has been in NSR since as fas as she knows. She continues to be concerned on medication changes being made and feels she is swelling more since decreasing Torsemide. Denies weight gain of more than 3-5 lbs.  States weight fluctuates, but she doesn't gain more than 3 overnight.   Complaining that she just can't do anything and nothing has been right since stopping Lasix.   Patient scheduled to see Dr. Caryl Comes today secondary to cancellation on his schedule.  She is thankful for helping.

## 2014-06-13 LAB — CUP PACEART INCLINIC DEVICE CHECK
Brady Statistic RV Percent Paced: 94 %
HIGH POWER IMPEDANCE MEASURED VALUE: 40 Ohm
Lead Channel Impedance Value: 387.5 Ohm
Lead Channel Impedance Value: 475 Ohm
Lead Channel Pacing Threshold Amplitude: 0.75 V
Lead Channel Pacing Threshold Amplitude: 0.75 V
Lead Channel Pacing Threshold Amplitude: 1.25 V
Lead Channel Pacing Threshold Pulse Width: 0.5 ms
Lead Channel Pacing Threshold Pulse Width: 0.5 ms
Lead Channel Sensing Intrinsic Amplitude: 1 mV
Lead Channel Sensing Intrinsic Amplitude: 12 mV
Lead Channel Setting Pacing Amplitude: 2 V
Lead Channel Setting Pacing Pulse Width: 0.5 ms
Lead Channel Setting Pacing Pulse Width: 0.5 ms
MDC IDC MSMT BATTERY REMAINING LONGEVITY: 42 mo
MDC IDC MSMT LEADCHNL LV PACING THRESHOLD PULSEWIDTH: 0.5 ms
MDC IDC MSMT LEADCHNL LV PACING THRESHOLD PULSEWIDTH: 0.5 ms
MDC IDC MSMT LEADCHNL RV IMPEDANCE VALUE: 325 Ohm
MDC IDC MSMT LEADCHNL RV PACING THRESHOLD AMPLITUDE: 0.75 V
MDC IDC PG MODEL: 3257
MDC IDC PG SERIAL: 7032457
MDC IDC SESS DTM: 20160531193600
MDC IDC SET LEADCHNL RA PACING AMPLITUDE: 2.25 V
MDC IDC SET LEADCHNL RV PACING AMPLITUDE: 2 V
MDC IDC SET LEADCHNL RV SENSING SENSITIVITY: 0.5 mV
MDC IDC STAT BRADY RA PERCENT PACED: 77 %
Zone Setting Detection Interval: 260 ms
Zone Setting Detection Interval: 300 ms
Zone Setting Detection Interval: 350 ms

## 2014-06-14 ENCOUNTER — Ambulatory Visit (INDEPENDENT_AMBULATORY_CARE_PROVIDER_SITE_OTHER): Payer: Medicare Other | Admitting: Family Medicine

## 2014-06-14 ENCOUNTER — Ambulatory Visit: Payer: Medicare Other

## 2014-06-14 ENCOUNTER — Encounter: Payer: Self-pay | Admitting: Family Medicine

## 2014-06-14 ENCOUNTER — Ambulatory Visit: Payer: Medicare Other | Admitting: Family Medicine

## 2014-06-14 DIAGNOSIS — E034 Atrophy of thyroid (acquired): Secondary | ICD-10-CM | POA: Diagnosis not present

## 2014-06-14 DIAGNOSIS — I10 Essential (primary) hypertension: Secondary | ICD-10-CM | POA: Diagnosis not present

## 2014-06-14 DIAGNOSIS — E038 Other specified hypothyroidism: Secondary | ICD-10-CM

## 2014-06-14 DIAGNOSIS — M1A9XX1 Chronic gout, unspecified, with tophus (tophi): Secondary | ICD-10-CM

## 2014-06-14 DIAGNOSIS — N183 Chronic kidney disease, stage 3 unspecified: Secondary | ICD-10-CM

## 2014-06-14 DIAGNOSIS — I5022 Chronic systolic (congestive) heart failure: Secondary | ICD-10-CM

## 2014-06-14 LAB — URIC ACID: Uric Acid, Serum: 10.5 mg/dL — ABNORMAL HIGH (ref 2.4–7.0)

## 2014-06-14 LAB — TSH: TSH: 0.45 u[IU]/mL (ref 0.35–4.50)

## 2014-06-14 NOTE — Assessment & Plan Note (Signed)
Well controlled. Continue current meds: Benazepril, coreg, lasix

## 2014-06-14 NOTE — Assessment & Plan Note (Signed)
Check uric acid today. Patient going on trip and wants to consider allopurinol when she gets back. If uric acid elevated, would consider low dose allopurinol at 100-200 mg only considering her GFR.

## 2014-06-14 NOTE — Progress Notes (Signed)
Garret Reddish, MD  Subjective:  Sandra [name] is a 81 y.o. year old very pleasant female patient who presents with:  Hypothyroidism-controlled previously  Lab Results  Component Value Date   TSH 0.41 10/16/2013   On thyroid medication-synthroid 135mcg ROS-No hair or nail changes. No heat/cold intolerance. No constipation or diarrhea. Denies shakiness or anxiety.  Hypertension-controlled CKD Stage III- stable with GFR just above 30, recently started on lasix 40mg  BID i/o torsemide for CHF . Statin intolerant. On ace-i.  BP Readings from Last 3 Encounters:  06/14/14 110/64  06/12/14 120/70  05/09/14 128/72   Home BP monitoring-no Compliant with medications-yes without side effects ROS-Denies any CP, HA, SOB, blurry vision, LE edema, transient weakness, orthopnea, PND.   Gout -tophi on several fingers. States seem to be better off spironolactone.  ROS- no active hot/swollen joints  Past Medical History- HOCM, a fib, defibrillator in place, CHF  Medications- reviewed and updated Current Outpatient Prescriptions  Medication Sig Dispense Refill  . benazepril (LOTENSIN) 10 MG tablet TAKE ONE TABLET BY MOUTH TWICE DAILY 180 tablet 2  . carvedilol (COREG) 12.5 MG tablet Take 1 tablet (12.5 mg total) by mouth 2 (two) times daily with a meal. 180 tablet 1  . clorazepate (TRANXENE) 7.5 MG tablet Take 7.5 mg by mouth 2 (two) times daily as needed for anxiety (for anxiety).    . dofetilide (TIKOSYN) 250 MCG capsule Take 1 capsule (250 mcg total) by mouth 2 (two) times daily. 180 capsule 2  . ezetimibe (ZETIA) 10 MG tablet Take 10 mg by mouth 3 (three) times a week.    . furosemide (LASIX) 40 MG tablet Take 1 tablet (40 mg total) by mouth 2 (two) times daily. 60 tablet 3  . SYNTHROID 100 MCG tablet TAKE ONE TABLET BY MOUTH DAILY BEFORE BREAKFAST. 90 tablet 1  . vitamin E 400 UNIT capsule Take 400 Units by mouth every other day.     . warfarin (COUMADIN) 5 MG tablet TAKE AS DIRECTED 90  tablet 1  . albuterol (PROVENTIL HFA;VENTOLIN HFA) 108 (90 BASE) MCG/ACT inhaler Inhale 2 puffs into the lungs every 6 (six) hours as needed for wheezing or shortness of breath. (Patient not taking: Reported on 06/14/2014) 1 Inhaler 0  . fluticasone (FLONASE) 50 MCG/ACT nasal spray USE TWO SPRAY(S) IN EACH NOSTRIL ONCE DAILY (Patient not taking: Reported on 06/14/2014) 16 g 0  . hydrocortisone 2.5 % cream Apply 1 application topically 2 (two) times daily as needed (had fissure, for that scar tissue, per pt.).     No current facility-administered medications for this visit.    Objective: BP 110/64 mmHg  Pulse 73  Temp(Src) 98.3 F (36.8 C)  Wt 152 lb (68.947 kg) Gen: NAD, resting comfortably CV: RRR no murmurs rubs or gallops Lungs: CTAB no crackles, wheeze, rhonchi Abdomen: soft/nontender/nondistended/normal bowel sounds. No rebound or guarding.  Ext: no edema, tophi on several fingers Skin: warm, dry, no rash Neuro: grossly normal, moves all extremities   Assessment/Plan:  Hypothyroidism Previously controlled on Synthroid 136mcg. Check TSH today   Chronic kidney disease, stage III (moderate) Cr right at 30. If Cr worsens, consider nephrology referral. Has been stable here for several years. On ace-i if any proteinuric element. Bp controlled. Cant tolerate statins unfortunately. Upcoming BMET to be checked by cards after change back to lasix for CHF   Gout Check uric acid today. Patient going on trip and wants to consider allopurinol when she gets back. If uric acid elevated, would  consider low dose allopurinol at 100-200 mg only considering her GFR.    Essential hypertension, benign Well controlled. Continue current meds: Benazepril, coreg, lasix      Orders Placed This Encounter  Procedures  . Uric Acid  . TSH    Eugenio Saenz

## 2014-06-14 NOTE — Assessment & Plan Note (Signed)
Previously controlled on Synthroid 156mcg. Check TSH today

## 2014-06-14 NOTE — Patient Instructions (Signed)
No changes today  Check uric acid  Call me when you get back from the beach and we will start a really low dose allopurinol likely

## 2014-06-14 NOTE — Assessment & Plan Note (Signed)
Cr right at 30. If Cr worsens, consider nephrology referral. Has been stable here for several years. On ace-i if any proteinuric element. Bp controlled. Cant tolerate statins unfortunately. Upcoming BMET to be checked by cards after change back to lasix for CHF

## 2014-06-15 ENCOUNTER — Other Ambulatory Visit: Payer: Self-pay

## 2014-06-15 ENCOUNTER — Telehealth: Payer: Self-pay

## 2014-06-15 MED ORDER — ALLOPURINOL 100 MG PO TABS
100.0000 mg | ORAL_TABLET | Freq: Every day | ORAL | Status: DC
Start: 2014-06-15 — End: 2014-07-18

## 2014-06-15 NOTE — Addendum Note (Signed)
Addended by: Clyde Lundborg A on: 06/15/2014 09:06 AM   Modules accepted: Orders

## 2014-06-15 NOTE — Telephone Encounter (Signed)
Patient advised, patient is aware to keep anticoag visit on Monday 6/6

## 2014-06-15 NOTE — Telephone Encounter (Signed)
Pt has been placed on Allopurinol 100mg  and the pharmacy has faxed over stating they are needing to know if this will affect her coumadin levels. Pt will need to know how to proceed with her coumadin.

## 2014-06-15 NOTE — Telephone Encounter (Signed)
Combination may increase INR. Have her follow up with Coumadin clinic on Monday as scheduled for adjustments. If she notices any increased bleeding hold coumadin until appointment.

## 2014-06-15 NOTE — Telephone Encounter (Signed)
Please advise, thanks.

## 2014-06-18 ENCOUNTER — Other Ambulatory Visit: Payer: Self-pay | Admitting: *Deleted

## 2014-06-18 ENCOUNTER — Ambulatory Visit: Payer: Medicare Other | Admitting: Family Medicine

## 2014-06-18 ENCOUNTER — Telehealth: Payer: Self-pay | Admitting: Family Medicine

## 2014-06-18 ENCOUNTER — Ambulatory Visit (INDEPENDENT_AMBULATORY_CARE_PROVIDER_SITE_OTHER): Payer: Medicare Other | Admitting: General Practice

## 2014-06-18 DIAGNOSIS — Z5181 Encounter for therapeutic drug level monitoring: Secondary | ICD-10-CM | POA: Diagnosis not present

## 2014-06-18 LAB — POCT INR: INR: 1.8

## 2014-06-18 MED ORDER — PREDNISONE 20 MG PO TABS: ORAL_TABLET | ORAL | Status: DC

## 2014-06-18 MED ORDER — FUROSEMIDE 40 MG PO TABS: 40.0000 mg | ORAL_TABLET | Freq: Two times a day (BID) | ORAL | Status: DC

## 2014-06-18 NOTE — Telephone Encounter (Signed)
Patient would like a callback regarding the below request.

## 2014-06-18 NOTE — Telephone Encounter (Signed)
Patient would like a re-fill on predniSONE (DELTASONE) 20 MG tablet sent to [facility] [place], [place] - [address]

## 2014-06-18 NOTE — Progress Notes (Signed)
Pre visit review using our clinic review tool, if applicable. No additional management support is needed unless otherwise documented below in the visit note. 

## 2014-06-18 NOTE — Telephone Encounter (Signed)
Medication sent in. 

## 2014-06-18 NOTE — Telephone Encounter (Signed)
Pt wants a refill on prednisone to take with her on her trip incase she has a flare up of inflammation in her fingers. Ok to refill?

## 2014-06-18 NOTE — Telephone Encounter (Signed)
Yes may fill 

## 2014-06-20 ENCOUNTER — Encounter: Payer: Medicare Other | Admitting: Internal Medicine

## 2014-06-22 ENCOUNTER — Other Ambulatory Visit (INDEPENDENT_AMBULATORY_CARE_PROVIDER_SITE_OTHER): Payer: Medicare Other | Admitting: *Deleted

## 2014-06-22 DIAGNOSIS — Z79899 Other long term (current) drug therapy: Secondary | ICD-10-CM

## 2014-06-22 LAB — BASIC METABOLIC PANEL
BUN: 31 mg/dL — ABNORMAL HIGH (ref 6–23)
CALCIUM: 9.4 mg/dL (ref 8.4–10.5)
CO2: 22 meq/L (ref 19–32)
Chloride: 107 mEq/L (ref 96–112)
Creatinine, Ser: 1.52 mg/dL — ABNORMAL HIGH (ref 0.40–1.20)
GFR: 34.94 mL/min — ABNORMAL LOW (ref >60.00)
GLUCOSE: 74 mg/dL (ref 70–99)
Potassium: 4 mEq/L (ref 3.5–5.1)
Sodium: 139 mEq/L (ref 135–145)

## 2014-06-22 NOTE — Addendum Note (Signed)
Addended by: Eulis Foster on: 06/22/2014 11:11 AM   Modules accepted: Orders

## 2014-06-26 ENCOUNTER — Other Ambulatory Visit: Payer: Self-pay

## 2014-06-26 MED ORDER — DOFETILIDE 250 MCG PO CAPS: 250.0000 ug | ORAL_CAPSULE | Freq: Two times a day (BID) | ORAL | Status: DC

## 2014-07-03 ENCOUNTER — Encounter: Payer: Medicare Other | Admitting: Internal Medicine

## 2014-07-05 NOTE — Addendum Note (Signed)
Addended by: Claude Manges on: 07/05/2014 08:47 AM   Modules accepted: Orders

## 2014-07-12 LAB — HM MAMMOGRAPHY: HM Mammogram: NORMAL

## 2014-07-13 ENCOUNTER — Encounter: Payer: Self-pay | Admitting: Family Medicine

## 2014-07-18 ENCOUNTER — Encounter: Payer: Self-pay | Admitting: Nurse Practitioner

## 2014-07-18 ENCOUNTER — Other Ambulatory Visit: Payer: Self-pay | Admitting: *Deleted

## 2014-07-18 ENCOUNTER — Ambulatory Visit (INDEPENDENT_AMBULATORY_CARE_PROVIDER_SITE_OTHER): Payer: Medicare Other | Admitting: Nurse Practitioner

## 2014-07-18 VITALS — BP 110/64 | HR 66 | Ht 63.5 in | Wt 151.6 lb

## 2014-07-18 DIAGNOSIS — I5022 Chronic systolic (congestive) heart failure: Secondary | ICD-10-CM | POA: Diagnosis not present

## 2014-07-18 DIAGNOSIS — I4819 Other persistent atrial fibrillation: Secondary | ICD-10-CM

## 2014-07-18 DIAGNOSIS — N183 Chronic kidney disease, stage 3 (moderate): Secondary | ICD-10-CM | POA: Diagnosis not present

## 2014-07-18 DIAGNOSIS — I481 Persistent atrial fibrillation: Secondary | ICD-10-CM

## 2014-07-18 NOTE — Patient Instructions (Signed)
Medication Instructions:   Your physician recommends that you continue on your current medications as directed. Please refer to the Current Medication list given to you today.    Labwork:   Testing/Procedures:   Follow-Up:  FOLLOW UP WITH AMBER IN 3 MONTHS  PER AMBER    Any Other Special Instructions Will Be Listed Below (If Applicable).
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
## 2014-07-19 ENCOUNTER — Ambulatory Visit (INDEPENDENT_AMBULATORY_CARE_PROVIDER_SITE_OTHER): Payer: Medicare Other | Admitting: General Practice

## 2014-07-19 DIAGNOSIS — Z5181 Encounter for therapeutic drug level monitoring: Secondary | ICD-10-CM | POA: Diagnosis not present

## 2014-07-19 LAB — BASIC METABOLIC PANEL
BUN: 36 mg/dL — ABNORMAL HIGH (ref 6–23)
CALCIUM: 9.5 mg/dL (ref 8.4–10.5)
CO2: 29 mEq/L (ref 19–32)
CREATININE: 1.93 mg/dL — AB (ref 0.40–1.20)
Chloride: 103 mEq/L (ref 96–112)
GFR: 26.52 mL/min — ABNORMAL LOW (ref >60.00)
GLUCOSE: 86 mg/dL (ref 70–99)
Potassium: 4 mEq/L (ref 3.5–5.1)
Sodium: 140 mEq/L (ref 135–145)

## 2014-07-19 LAB — POCT INR: INR: 2.7

## 2014-07-19 NOTE — Progress Notes (Signed)
Pre visit review using our clinic review tool, if applicable. No additional management support is needed unless otherwise documented below in the visit note. 

## 2014-07-20 ENCOUNTER — Other Ambulatory Visit: Payer: Self-pay | Admitting: *Deleted

## 2014-07-20 MED ORDER — FUROSEMIDE 40 MG PO TABS: 40.0000 mg | ORAL_TABLET | Freq: Every day | ORAL | Status: DC

## 2014-07-30 ENCOUNTER — Encounter: Payer: Self-pay | Admitting: Internal Medicine

## 2014-07-30 ENCOUNTER — Ambulatory Visit (INDEPENDENT_AMBULATORY_CARE_PROVIDER_SITE_OTHER): Payer: Medicare Other | Admitting: Internal Medicine

## 2014-07-30 VITALS — BP 126/72 | Temp 97.6°F | Ht 63.5 in | Wt 156.6 lb

## 2014-07-30 DIAGNOSIS — I5022 Chronic systolic (congestive) heart failure: Secondary | ICD-10-CM

## 2014-07-30 DIAGNOSIS — M009 Pyogenic arthritis, unspecified: Secondary | ICD-10-CM | POA: Diagnosis not present

## 2014-07-30 DIAGNOSIS — I482 Chronic atrial fibrillation, unspecified: Secondary | ICD-10-CM

## 2014-07-30 DIAGNOSIS — M10041 Idiopathic gout, right hand: Secondary | ICD-10-CM | POA: Diagnosis not present

## 2014-07-30 DIAGNOSIS — M79644 Pain in right finger(s): Secondary | ICD-10-CM

## 2014-07-30 MED ORDER — CEPHALEXIN 500 MG PO CAPS: 500.0000 mg | ORAL_CAPSULE | Freq: Two times a day (BID) | ORAL | Status: DC

## 2014-07-30 NOTE — Patient Instructions (Addendum)
Please contact Dr. Bertis Ruddy office re: appt this Thursday Notify coumadin clinic that you were started on antibiotic.  You will need repeat INR within 1 week. Follow up with Dr. Yong Channel within 2 weeks. Contact our office if your symptoms do not improve or gets worse. Take Lasix 60 mg for next 3 days.

## 2014-07-30 NOTE — Assessment & Plan Note (Signed)
Patient is monitoring her weight closely.  She will use higher dose of lasix 60 mg for 3 days if needed.

## 2014-07-30 NOTE — Assessment & Plan Note (Signed)
Patient advised to repeat INR within 1 week of starting Keflex.

## 2014-07-30 NOTE — Assessment & Plan Note (Signed)
81 year old white female with history of tophaceous gout presents with worsening right index finger pain despite using prednisone. We discussed possibility that she may have septic arthritis. I spoke with Dr. Burney Gauze directly. We will start Keflex 500 mg twice daily considering her renal insufficiency. He will see her this Thursday for possible joint debridement.

## 2014-07-30 NOTE — Progress Notes (Signed)
Pre visit review using our clinic review tool, if applicable. No additional management support is needed unless otherwise documented below in the visit note. 

## 2014-07-30 NOTE — Progress Notes (Signed)
Subjective:    Patient ID: Sandra [name], female    DOB: [birth year], 81 y.o.   MRN: [id number]  HPI  81 year old white female with history of cardiomyopathy, chronic renal insufficiency and gout presents with right finger pain. She has been treated by Dr. Yong Channel her primary care physician twice over the last 2 months. She reports her most recent gout flare occurred on 07/20/2014. She reports she usually responds quickly to prednisone. However, her right index finger has progressively worsened over last week. Area is red, tight and tender. She denies any drainage.  She reports seeing a hand specialist in the past for tophaceous gout.  She recalls seeing Dr. Burney Gauze  Patient reports 3-4 episodes of gout flares over the last 6 months. One of her diuretics was discontinued.  Her uric acid level on 06/14/2014 ws 10.5.  Dr. Yong Channel was considering start of allopurinol.  CHF - she reports gaining 4-5 lbs since starting prednisone.  She has not gained over 2 lbs in 48 hrs.  She denies chest pain, lower ext edema or orthopnea  A Fib - She is chronically anticoagulated with warfarin. Her last INR was 2.7 on 07/19/2014.   Review of Systems Negative for fever or chills.  Negative for orthopnea (she sleeps on single pillow)    Past Medical History  Diagnosis Date  . Atrial fibrillation     on Tikosyn and Coumadin  . Hyperlipidemia   . Hypothyroidism   . Bradycardia   . Cardiomyopathy, hypertrophic nonobstructive     ejection fraction 25%  . Anal fissure   . Adenomatous colon polyp   . Diverticulosis   . ICD-CRT     generator change 2013  . Systolic heart failure   . History of colonoscopy   . Migraines   . Hyperlipidemia   . Hypertension   . Cardiac arrhythmia due to congenital heart disease     History   Social History  . Marital Status: Married    Spouse Name: N/A  . Number of Children: 3  . Years of Education: N/A   Occupational History  . Retired     Probation officer    Social History Main Topics  . Smoking status: Former Smoker -- 0.20 packs/day for 19 years    Types: Cigarettes    Quit date: 01/13/1987  . Smokeless tobacco: Never Used  . Alcohol Use: No  . Drug Use: No  . Sexual Activity: Not Currently   Other Topics Concern  . Not on file   Social History Narrative   Married 1953 (husband Clare Gandy in our Network engineer). 3 girls (1 is a patient here-[name]). 2 grandkids.       Retired in [place]: very active, going to church and church friends, movies    Past Surgical History  Procedure Laterality Date  . Cardioversion    . Cardiac defibrillator placement  2007    pacemaker  . Vaginal hysterectomy      uterus alone  . Ovarian cyst removal      1989-benign  . Biv icd genertaor change out N/A 03/23/2011    Procedure: BIV ICD GENERTAOR CHANGE OUT;  Surgeon: Deboraha Sprang, MD;  Location: [facility] CATH LAB;  Service: Cardiovascular;  Laterality: N/A;  . Colonoscopy      Family History  Problem Relation Age of Onset  . Heart disease Mother   . Colon cancer Father   . COPD Father   . Heart disease  Father   . Cardiomyopathy Daughter   . Breast cancer Paternal Aunt   . Colon polyps Father   . Diabetes Neg Hx   . Kidney disease Neg Hx     Allergies  Allergen Reactions  . Levofloxacin Other (See Comments)    Due to cardiac  AF  . Procaine Hcl Other (See Comments)    REACTION: smothers  . Spironolactone Diarrhea    Abdominal pain  . Amoxicillin Other (See Comments)    Rash   . Codeine Other (See Comments)    REACTION: nausea  . Niacin Other (See Comments)    REACTION: rash  . Ramipril Other (See Comments)    REACTION: unspecified  . Statins Other (See Comments)    REACTION: muscle aches    Current Outpatient Prescriptions on File Prior to Visit  Medication Sig Dispense Refill  . benazepril (LOTENSIN) 10 MG tablet TAKE ONE TABLET BY MOUTH TWICE DAILY 180 tablet 2  . carvedilol (COREG) 12.5 MG tablet Take 1  tablet (12.5 mg total) by mouth 2 (two) times daily with a meal. 180 tablet 1  . dofetilide (TIKOSYN) 250 MCG capsule Take 1 capsule (250 mcg total) by mouth 2 (two) times daily. 180 capsule 0  . ezetimibe (ZETIA) 10 MG tablet Take 10 mg by mouth 3 (three) times a week.    . furosemide (LASIX) 40 MG tablet Take 1 tablet (40 mg total) by mouth daily. 90 tablet 1  . predniSONE (DELTASONE) 20 MG tablet Take 2 tablets for 3 days, 1 tablet for 3 days, 1/2 tablet for 3 days then stop 12 tablet 0  . SYNTHROID 100 MCG tablet TAKE ONE TABLET BY MOUTH DAILY BEFORE BREAKFAST. 90 tablet 1  . vitamin E 400 UNIT capsule Take 400 Units by mouth every other day.     . warfarin (COUMADIN) 5 MG tablet TAKE AS DIRECTED 90 tablet 1  . albuterol (PROVENTIL HFA;VENTOLIN HFA) 108 (90 BASE) MCG/ACT inhaler Inhale 2 puffs into the lungs every 6 (six) hours as needed for wheezing or shortness of breath. (Patient not taking: Reported on 07/30/2014) 1 Inhaler 0  . clorazepate (TRANXENE) 7.5 MG tablet Take 7.5 mg by mouth 2 (two) times daily as needed for anxiety (for anxiety).    . fluticasone (FLONASE) 50 MCG/ACT nasal spray USE TWO SPRAY(S) IN EACH NOSTRIL ONCE DAILY (Patient not taking: Reported on 07/30/2014) 16 g 0  . hydrocortisone 2.5 % cream Apply 1 application topically 2 (two) times daily as needed (had fissure, for that scar tissue, per pt.).     No current facility-administered medications on file prior to visit.    BP 126/72 mmHg  Temp(Src) 97.6 F (36.4 C) (Oral)  Ht 5' 3.5" (1.613 m)  Wt 156 lb 9.6 oz (71.033 kg)  BMI 27.30 kg/m2    Objective:   Physical Exam  Constitutional: She is oriented to person, place, and time. She appears well-developed and well-nourished. No distress.  HENT:  Head: Normocephalic and atraumatic.  Eyes: EOM are normal. No scleral icterus.  Cardiovascular: Normal rate, regular rhythm and normal heart sounds.  Exam reveals no gallop.   No murmur heard. Pulmonary/Chest: Effort  normal and breath sounds normal. She has no wheezes.  Musculoskeletal: She exhibits no edema.  Neurological: She is alert and oriented to person, place, and time. No cranial nerve deficit.  Skin:  Swollen, red, and tender right index finger DIP.   Psychiatric: She has a normal mood and affect. Her behavior is normal.  Assessment & Plan:

## 2014-07-30 NOTE — Assessment & Plan Note (Signed)
I agree with Dr. Yong Channel that she may be candidate for allopurinol (renal dose).  Hopefully, there will be no drug interaction with her cardiac medication.

## 2014-08-03 ENCOUNTER — Other Ambulatory Visit: Payer: Self-pay | Admitting: Family Medicine

## 2014-08-06 ENCOUNTER — Ambulatory Visit (INDEPENDENT_AMBULATORY_CARE_PROVIDER_SITE_OTHER): Payer: Medicare Other | Admitting: General Practice

## 2014-08-06 DIAGNOSIS — I4891 Unspecified atrial fibrillation: Secondary | ICD-10-CM | POA: Diagnosis not present

## 2014-08-06 DIAGNOSIS — Z5181 Encounter for therapeutic drug level monitoring: Secondary | ICD-10-CM

## 2014-08-06 LAB — POCT INR: INR: 2.2

## 2014-08-06 NOTE — Progress Notes (Signed)
Pre visit review using our clinic review tool, if applicable. No additional management support is needed unless otherwise documented below in the visit note. 

## 2014-08-10 ENCOUNTER — Telehealth: Payer: Self-pay | Admitting: Internal Medicine

## 2014-08-10 DIAGNOSIS — I5022 Chronic systolic (congestive) heart failure: Secondary | ICD-10-CM

## 2014-08-10 NOTE — Telephone Encounter (Signed)
Patient called because she wants to know is she can increase her Lasix dose.  On 7/13 her dose of Lasix was decreased to 40 mg daily from 40 mg twice a day.  On the 7/14 she gained a lb and took 60 mg of lasiix.  The next two days she took 40 mg then 60 mg  On 7/22 her weight was up from 149 to 154 that she attributes to being on Prednisone and antibiotic.  This week  7/22 through 7/28 she has been taking between 40- 80 mg.  She said she has been in and out of her normal Atrial Fib this week ( HR 70-88)  Does not know B/P.  O2 sat 98%  Prednisone complete for 11 days and antibiotic for 1 day.    Discussed with Dr. Caryl Comes.  Instructed patient to alternate Lasix dose, 40 mg and 60 mg and have BMET in one week

## 2014-08-10 NOTE — Telephone Encounter (Signed)
New message     Pt is concerned about her fluid pill and retaining fluid, pt gained 2 pounds in one day. Pt states she just doesn't feel right Pt wants to know if she can up her fluid pill Pt c/o swelling: STAT is pt has developed SOB within 24 hours  1. How long have you been experiencing swelling? 3 weeks  2. Where is the swelling located? Stomach  3.  Are you currently taking a "fluid pill"?Lasix 40mg   4.  Are you currently SOB? No  5.  Have you traveled recently?No  Please call to discuss

## 2014-08-14 ENCOUNTER — Telehealth: Payer: Self-pay | Admitting: Internal Medicine

## 2014-08-14 NOTE — Telephone Encounter (Signed)
Weight gain of 6 pds since OV on 7/6 w/ Chanetta Marshall, NP. Chronic DOE.Marland Kitchen Feels ok at rest. Sounded SOB while on phone w/ pt when she was moving around. Currently taking Lasix 40 mg one day, 60 mg the next - alternating this dosing regiment (per Dr. Caryl Comes).  She admits to taking 80 mg some days trying to get extra fluid off. Advised patient that we need to check kidney function with increased dosing - she will come by office on Friday for BMET. Scheduled her to follow up with Amber on Monday. (moved 8/17 appt to earlier) Patient verbalized understanding and agreeable to plan.

## 2014-08-14 NOTE — Telephone Encounter (Signed)
New message     Pt having problem with fluid pill and also SOB; pt has gained 6 pounds  Pt had hand surgery yesterday   Pt is also on prednisone   Pt c/o Shortness Of Breath: STAT if SOB developed within the last 24 hours or pt is noticeably SOB on the phone  1. Are you currently SOB (can you hear that pt is SOB on the phone)? No  2. How long have you been experiencing SOB? July 7th or 8th  3. Are you SOB when sitting or when up moving around? Moving around  4. Are you currently experiencing any other symptoms? No except pain in fingers from surgery yesterday  Please call to discuss

## 2014-08-16 ENCOUNTER — Ambulatory Visit: Payer: Medicare Other

## 2014-08-17 ENCOUNTER — Other Ambulatory Visit (INDEPENDENT_AMBULATORY_CARE_PROVIDER_SITE_OTHER): Payer: Medicare Other | Admitting: *Deleted

## 2014-08-17 DIAGNOSIS — I5022 Chronic systolic (congestive) heart failure: Secondary | ICD-10-CM

## 2014-08-17 LAB — BASIC METABOLIC PANEL
BUN: 26 mg/dL — AB (ref 6–23)
CALCIUM: 9.2 mg/dL (ref 8.4–10.5)
CHLORIDE: 104 meq/L (ref 96–112)
CO2: 28 mEq/L (ref 19–32)
Creatinine, Ser: 1.57 mg/dL — ABNORMAL HIGH (ref 0.40–1.20)
GFR: 33.65 mL/min — ABNORMAL LOW (ref >60.00)
GLUCOSE: 89 mg/dL (ref 70–99)
Potassium: 4 mEq/L (ref 3.5–5.1)
Sodium: 140 mEq/L (ref 135–145)
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
## 2014-08-20 ENCOUNTER — Telehealth: Payer: Self-pay | Admitting: *Deleted

## 2014-08-20 ENCOUNTER — Other Ambulatory Visit: Payer: Self-pay | Admitting: *Deleted

## 2014-08-20 ENCOUNTER — Ambulatory Visit (INDEPENDENT_AMBULATORY_CARE_PROVIDER_SITE_OTHER): Payer: Medicare Other | Admitting: Nurse Practitioner

## 2014-08-20 ENCOUNTER — Encounter: Payer: Self-pay | Admitting: Nurse Practitioner

## 2014-08-20 VITALS — BP 118/70 | HR 69 | Ht 63.0 in | Wt 156.4 lb

## 2014-08-20 DIAGNOSIS — I481 Persistent atrial fibrillation: Secondary | ICD-10-CM | POA: Diagnosis not present

## 2014-08-20 DIAGNOSIS — N183 Chronic kidney disease, stage 3 unspecified: Secondary | ICD-10-CM

## 2014-08-20 DIAGNOSIS — I4819 Other persistent atrial fibrillation: Secondary | ICD-10-CM

## 2014-08-20 DIAGNOSIS — Z79899 Other long term (current) drug therapy: Secondary | ICD-10-CM

## 2014-08-20 DIAGNOSIS — I5022 Chronic systolic (congestive) heart failure: Secondary | ICD-10-CM

## 2014-08-20 LAB — BASIC METABOLIC PANEL
BUN: 28 mg/dL — ABNORMAL HIGH (ref 6–23)
CO2: 27 mEq/L (ref 19–32)
Calcium: 9.7 mg/dL (ref 8.4–10.5)
Chloride: 104 mEq/L (ref 96–112)
Creatinine, Ser: 1.64 mg/dL — ABNORMAL HIGH (ref 0.40–1.20)
GFR: 31.99 mL/min — AB (ref >60.00)
Glucose, Bld: 84 mg/dL (ref 70–99)
POTASSIUM: 3.6 meq/L (ref 3.5–5.1)
Sodium: 141 mEq/L (ref 135–145)

## 2014-08-20 MED ORDER — METOLAZONE 2.5 MG PO TABS: 2.5000 mg | ORAL_TABLET | Freq: Every day | ORAL | Status: DC

## 2014-08-20 MED ORDER — FUROSEMIDE 40 MG PO TABS: 40.0000 mg | ORAL_TABLET | ORAL | Status: DC

## 2014-08-20 NOTE — Telephone Encounter (Signed)
Advised patient of lab results and scheduled labs/nurse visit

## 2014-08-20 NOTE — Telephone Encounter (Signed)
-----   Message from Patsey Berthold, NP sent at 08/20/2014  3:37 PM EDT ----- Please notify patient of stable labs.  Please have her come back in on Monday 8/22 for a nurse visit and weight as well as BMET with taking Zaroxyln.  Thank you!

## 2014-08-20 NOTE — Patient Instructions (Addendum)
Medication Instructions:   START TAKING  2.5 XAROXLYN  ONLY 30 MINUTES  BEFORE LASIX ONCE A WEEK   START TAKING  LASIX 40 MG Monday Wednesday Friday Sunday   START TAKING LASIX 60 MG  Tuesday  Thursday Saturday     Labwork:  BMET    Testing/Procedures:  NONE ORDER TODAY    Follow-Up:  FOLLOW UP WITH DR Caryl Comes    Any Other Special Instructions Will Be Listed Below (If Applicable).  PLEASE CONTACT OFFICE IF YOU HAVE ANY CONCERNS OR CARDIAC RELATED ISSUES

## 2014-08-23 LAB — CUP PACEART INCLINIC DEVICE CHECK
Date Time Interrogation Session: 20160811142823
Lead Channel Setting Pacing Amplitude: 2 V
Lead Channel Setting Pacing Amplitude: 2.25 V
Lead Channel Setting Pacing Pulse Width: 0.5 ms
Lead Channel Setting Pacing Pulse Width: 0.5 ms
MDC IDC PG MODEL: 3257
MDC IDC PG SERIAL: 7032457
MDC IDC SET LEADCHNL RV PACING AMPLITUDE: 2 V
MDC IDC SET LEADCHNL RV SENSING SENSITIVITY: 0.5 mV
MDC IDC SET ZONE DETECTION INTERVAL: 300 ms
Zone Setting Detection Interval: 260 ms
Zone Setting Detection Interval: 350 ms

## 2014-08-29 ENCOUNTER — Ambulatory Visit: Payer: Medicare Other | Admitting: Nurse Practitioner

## 2014-08-31 ENCOUNTER — Encounter: Payer: Self-pay | Admitting: Family Medicine

## 2014-08-31 ENCOUNTER — Ambulatory Visit (INDEPENDENT_AMBULATORY_CARE_PROVIDER_SITE_OTHER): Payer: Medicare Other | Admitting: Family Medicine

## 2014-08-31 VITALS — BP 90/64 | HR 92 | Temp 98.3°F | Wt 145.0 lb

## 2014-08-31 DIAGNOSIS — I5022 Chronic systolic (congestive) heart failure: Secondary | ICD-10-CM

## 2014-08-31 DIAGNOSIS — M10041 Idiopathic gout, right hand: Secondary | ICD-10-CM | POA: Diagnosis not present

## 2014-08-31 DIAGNOSIS — I10 Essential (primary) hypertension: Secondary | ICD-10-CM

## 2014-08-31 NOTE — Patient Instructions (Signed)
Medication Instructions:  Same, see list  Still think you need to start allopurinol. You would like to wait until you get back from your trip. Call me. You will need to see coumadin clinic closely when we add this.   Other Instructions:  Call me if gout gets flare dup in [place] and we can call in prednisone. If not controlled on this, come see [place] as have needed antibiotics in past  Testing/Procedures/Immunizations: Health Maintenance Due  Topic Date Due  . INFLUENZA VACCINE - October or so 08/13/2014   Follow-Up (all visit scheduling, rescheduling, cancellations including labs should be scheduled at front desk): 3-4 month check in  Sooner if new or worsening symptoms

## 2014-08-31 NOTE — Progress Notes (Signed)
Garret Reddish, MD  Subjective:  Sandra [name] is a 81 y.o. year old very pleasant female patient who presents with:  CHF- wt up 6 lbs at cards. Started metolazone 2.5mg  1 tablet weekly. Advised HF clinic, consider right heart cath. at home up to 156 now down to 147 on home scale after start of metolazone in addition to lasiz  Gout - poor control with uric acid >10, recent surgery for tophi on right hand healing well, still with tophi on left.   Hypertension-controlled, asymptomatic with lower BP  BP Readings from Last 3 Encounters:  08/31/14 90/64  08/20/14 118/70  07/30/14 126/72   Also See problem oriented charting ROS-  No chest pain, shortness of breath, some stress dealing with husband, no headaches blurry vision. No dizziness  Past Medical History- HOCM, defibrillator in place, a fib, hypothroid, HLD, CKD III, anxiety  Medications- reviewed and updated Current Outpatient Prescriptions  Medication Sig Dispense Refill  . benazepril (LOTENSIN) 10 MG tablet TAKE ONE TABLET BY MOUTH TWICE DAILY 180 tablet 2  . carvedilol (COREG) 12.5 MG tablet Take 1 tablet (12.5 mg total) by mouth 2 (two) times daily with a meal. 180 tablet 1  . dofetilide (TIKOSYN) 250 MCG capsule Take 1 capsule (250 mcg total) by mouth 2 (two) times daily. 180 capsule 0  . ezetimibe (ZETIA) 10 MG tablet Take 10 mg by mouth 3 (three) times a week.    . furosemide (LASIX) 40 MG tablet Take 1 tablet (40 mg total) by mouth every other day. 90 tablet 1  . metolazone (ZAROXOLYN) 2.5 MG tablet Take 1 tablet (2.5 mg total) by mouth daily. TAKE ONE TABLET ONCE A WEEK 30 MINUTES BEFORE TAKING LASIX 4 tablet 0  . SYNTHROID 100 MCG tablet TAKE ONE TABLET BY MOUTH ONCE DAILY BEFORE  BREAKFAST 90 tablet 3  . vitamin E 400 UNIT capsule Take 400 Units by mouth every other day.     . warfarin (COUMADIN) 5 MG tablet TAKE AS DIRECTED 90 tablet 1  . albuterol (PROVENTIL HFA;VENTOLIN HFA) 108 (90 BASE) MCG/ACT inhaler Inhale 2  puffs into the lungs every 6 (six) hours as needed for wheezing or shortness of breath. (Patient not taking: Reported on 07/30/2014) 1 Inhaler 0  . clorazepate (TRANXENE) 7.5 MG tablet Take 7.5 mg by mouth 2 (two) times daily as needed for anxiety (for anxiety).     Objective: BP 90/64 mmHg  Pulse 92  Temp(Src) 98.3 F (36.8 C)  Wt 145 lb (65.772 kg) Gen: NAD, resting comfortably Mucous membranes are moist. CV: RRR no murmurs rubs or gallops Lungs: CTAB no crackles, wheeze, rhonchi Abdomen: soft/nontender/nondistended/normal bowel sounds. No rebound or guarding.  Ext: no edema Skin: warm, dry Tophaceous changes on left 2nd finger DIP, tender to touch Neuro: grossly normal, moves all extremities   Assessment/Plan:  Chronic systolic heart failure Improved control with weight down 9 lbs on home scales after starting metolazone once weekly in addition to daily lasix. We discussed continuing to trend weight and take dose until follows up with cards.   Gout Discussed I would still recommend allopurinol at 100mg , really need to lower uric acid considering her tophi. She is hesitant and has some upcoming travel. She will call me when she returns, asks me to run medicine through interaction checker and we will at that time.   Essential hypertension, benign Controlled on Benazepril, coreg, lasix daily. BP on lower end of desired range but patient asymptomatic, we will trend BP and  consider adjustments if persists <100/65  3-4 month f/u

## 2014-09-01 NOTE — Assessment & Plan Note (Signed)
Controlled on Benazepril, coreg, lasix daily. BP on lower end of desired range but patient asymptomatic, we will trend BP and consider adjustments if persists <100/65

## 2014-09-01 NOTE — Assessment & Plan Note (Signed)
Improved control with weight down 9 lbs on home scales after starting metolazone once weekly in addition to daily lasix. We discussed continuing to trend weight and take dose until follows up with cards.

## 2014-09-01 NOTE — Assessment & Plan Note (Signed)
Discussed I would still recommend allopurinol at 100mg , really need to lower uric acid considering her tophi. She is hesitant and has some upcoming travel. She will call me when she returns, asks me to run medicine through interaction checker and we will at that time.

## 2014-09-03 ENCOUNTER — Ambulatory Visit (INDEPENDENT_AMBULATORY_CARE_PROVIDER_SITE_OTHER): Payer: Medicare Other | Admitting: General Practice

## 2014-09-03 ENCOUNTER — Ambulatory Visit (INDEPENDENT_AMBULATORY_CARE_PROVIDER_SITE_OTHER): Payer: Medicare Other | Admitting: *Deleted

## 2014-09-03 ENCOUNTER — Other Ambulatory Visit (INDEPENDENT_AMBULATORY_CARE_PROVIDER_SITE_OTHER): Payer: Medicare Other

## 2014-09-03 DIAGNOSIS — I4891 Unspecified atrial fibrillation: Secondary | ICD-10-CM | POA: Diagnosis not present

## 2014-09-03 DIAGNOSIS — I5022 Chronic systolic (congestive) heart failure: Secondary | ICD-10-CM

## 2014-09-03 DIAGNOSIS — Z79899 Other long term (current) drug therapy: Secondary | ICD-10-CM | POA: Diagnosis not present

## 2014-09-03 DIAGNOSIS — Z5181 Encounter for therapeutic drug level monitoring: Secondary | ICD-10-CM

## 2014-09-03 LAB — BASIC METABOLIC PANEL
BUN: 48 mg/dL — AB (ref 6–23)
CHLORIDE: 104 meq/L (ref 96–112)
CO2: 30 meq/L (ref 19–32)
Calcium: 9.6 mg/dL (ref 8.4–10.5)
Creatinine, Ser: 1.87 mg/dL — ABNORMAL HIGH (ref 0.40–1.20)
GFR: 27.49 mL/min — ABNORMAL LOW (ref >60.00)
Glucose, Bld: 72 mg/dL (ref 70–99)
POTASSIUM: 4 meq/L (ref 3.5–5.1)
Sodium: 142 mEq/L (ref 135–145)

## 2014-09-03 LAB — POCT INR: INR: 1.9

## 2014-09-03 NOTE — Progress Notes (Signed)
Pre visit review using our clinic review tool, if applicable. No additional management support is needed unless otherwise documented below in the visit note. 

## 2014-09-03 NOTE — Progress Notes (Signed)
1.) Reason for visit: BP check/ repeat BMP  2.) Name of MD requesting visit: Chanetta Marshall, NP (for Dr. Caryl Comes)  3.) H&P: The patient was seen by Chanetta Marshall, NP on 08/20/14 for follow up. She was having difficulty at the time with volume status and renal insufficiency. She was reporting SOB on 08/20/14. Her weight in the office on 08/20/14 was 156 lbs/ BP- 118/70. Per the patient, her baseline weight is around 149 lbs. Lasix was increased to 40 mg once daily except for 60 mg daily on Tuesday/ Thursday/ Saturday. She was also started on metolazone 2.5 mg one tablet every Monday.   4.)  ROS related to problem: Today the patient's weight is 152 .4 lbs on our scale. She was 150.4 lbs on her home scale. She has brought in BP readings since Saturday (8/20) as she had a "blacking out" spell on Saturday morning- BP was- 78/49 (70)/ noon-102/65 (70)/ PM- 115/68 (70) - she only took benazepril once in the AM that day. Sunday AM- 102/68 (70)/ noon- 99/60 (68)/ PM- 107/69 (71)- she took one tablet of benazepril. Today- 111/67 (65) She did have a repeat BMP today.  5).  Assessment and plan per MD: I advised the patient I would forward her BP readings to Dr. Tyrone Apple, NP for review. I have          advised her to stay on benazepril 10 mg once daily since her BP is running low and she had a pre-syncopal episode on Saturday. I explained that since her lasix is increased and she is on metolazone once a week, her volume status has changed. We will review lab results once available. She is scheduled to follow up on 09/13/14 with Dr. Caryl Comes. The patient is agreeable.

## 2014-09-04 ENCOUNTER — Telehealth: Payer: Self-pay | Admitting: Internal Medicine

## 2014-09-04 NOTE — Telephone Encounter (Signed)
New message     For Sandra [name] Pt want to talk to [facility] about her bp and fluid pills.

## 2014-09-04 NOTE — Telephone Encounter (Signed)
I spoke with the patient. She reports that her BP last night was 126/79 (69) & this morning she was 116/71 (67). We had discussed yesterday at her nurse visit to hold her PM dose of benazepril due to low BP readings and a pre-syncopal spell on Saturday. She had a bmp yesterday and her creatinine had risen to 1.8. Chanetta Marshall, NP had her hold her discontinue her dose of metolazone which she has been taking once a week x 2 doses, she was due to take this yesterday, but held the dose. Per the patient's report this morning, after I spoke with her yesterday afternoon, she started to have lower extremity edema and increased SOB. She reports today that she took her benazepril last night since her SBP was 126. She did take lasix 60 mg yesterday evening for her symptoms. Her weight this morning was stable at 150 lbs. Her lasix dose has been 40 mg once daily on M/W/F/Sun & 60 mg once daily on T/TH/Sat. The patient thinks she is going to require more lasix than her current dosing. I advised I would forward to Chanetta Marshall, NP for review and see if she feels the patient would be ok to take 40 mg once daily 3 x a week/ 60 mg once daily 4 x a week. The patient is agreeable. Again, she his due to follow up with Dr. Caryl Comes on 09/13/14 and she will be traveling [place] on 9/4.

## 2014-09-13 ENCOUNTER — Ambulatory Visit (INDEPENDENT_AMBULATORY_CARE_PROVIDER_SITE_OTHER): Payer: Medicare Other | Admitting: Internal Medicine

## 2014-09-13 ENCOUNTER — Encounter: Payer: Self-pay | Admitting: Internal Medicine

## 2014-09-13 VITALS — BP 120/72 | HR 74 | Ht 63.0 in | Wt 154.0 lb

## 2014-09-13 DIAGNOSIS — Z95810 Presence of automatic (implantable) cardiac defibrillator: Secondary | ICD-10-CM | POA: Diagnosis not present

## 2014-09-13 DIAGNOSIS — I5022 Chronic systolic (congestive) heart failure: Secondary | ICD-10-CM | POA: Diagnosis not present

## 2014-09-13 DIAGNOSIS — Z4502 Encounter for adjustment and management of automatic implantable cardiac defibrillator: Secondary | ICD-10-CM | POA: Diagnosis not present

## 2014-09-13 DIAGNOSIS — I481 Persistent atrial fibrillation: Secondary | ICD-10-CM | POA: Diagnosis not present

## 2014-09-13 DIAGNOSIS — I4819 Other persistent atrial fibrillation: Secondary | ICD-10-CM

## 2014-09-13 DIAGNOSIS — I421 Obstructive hypertrophic cardiomyopathy: Secondary | ICD-10-CM | POA: Diagnosis not present

## 2014-09-13 NOTE — Progress Notes (Signed)
Patient Care Team: Marin Olp, MD as PCP - General (Family Medicine) Neldon Mc, MD (General Surgery) Charlotte Crumb, MD as Consulting Physician (Orthopedic Surgery)   HPI  Sandra [name] is a 81 y.o. female seen in followup for congestive heart failure in the setting of hypertrophic cardiomyopathy with depressed left ventricular function. She is status post CRT-D implantation.  She is on Coumadin. Because of recurrences of atrial fibrillation she is also a put on dofetilide    She is troubled with recurrent episodes of atrial fibrillation which have been quite symptomatic. She has been managed with this on dofetilide.  She's been seen a couple of times over the summer with worsening shortness of breath and renal insufficiency. We have had multiple discussions regarding her diuretics. We tried torsemide she ended up back on furosemide for her pleasure  At her last visit here she saw A S because of increased weight she discussed with Dr. Reine Just and metolazone was added once a week.;  Most recent metabolic profile date BUN Cr      09/03/14  1.87       She has reverted to atrial fibrillation and feels terrible. This happened yesterday.  She is also noted increased volume over the last few days and weeks. The addition of metolazone was associated with profound hypotension on the second occasion.    We have in the past discussed amiodarone versus AV junction ablation. She has been reluctant to consider amiodarone because of the side effects.  Interval echo 10/15 demonstrated RV dysfunction and biatrial enlargement (LAE-2.54) with an LVEF 20-25%     Last echocardiogram 30% 10/13 followed by AV optimization 2/14  Past Medical History  Diagnosis Date  . Atrial fibrillation     on Tikosyn and Coumadin  . Hyperlipidemia   . Hypothyroidism   . Bradycardia   . Cardiomyopathy, hypertrophic nonobstructive     ejection fraction 25%  . Anal fissure   . Adenomatous  colon polyp   . Diverticulosis   . ICD-CRT     generator change 2013  . Systolic heart failure   . History of colonoscopy   . Migraines   . Hyperlipidemia   . Hypertension   . Cardiac arrhythmia due to congenital heart disease     Past Surgical History  Procedure Laterality Date  . Cardioversion    . Cardiac defibrillator placement  2007    pacemaker  . Vaginal hysterectomy      uterus alone  . Ovarian cyst removal      1989-benign  . Biv icd genertaor change out N/A 03/23/2011    Procedure: BIV ICD GENERTAOR CHANGE OUT;  Surgeon: Deboraha Sprang, MD;  Location: [facility] CATH LAB;  Service: Cardiovascular;  Laterality: N/A;  . Colonoscopy      Current Outpatient Prescriptions  Medication Sig Dispense Refill  . albuterol (PROVENTIL HFA;VENTOLIN HFA) 108 (90 BASE) MCG/ACT inhaler Inhale 2 puffs into the lungs every 6 (six) hours as needed for wheezing or shortness of breath. 1 Inhaler 0  . b complex vitamins tablet Take one tablet by mouth three times a week    . benazepril (LOTENSIN) 10 MG tablet TAKE ONE TABLET BY MOUTH TWICE DAILY 180 tablet 2  . carvedilol (COREG) 12.5 MG tablet Take 1 tablet (12.5 mg total) by mouth 2 (two) times daily with a meal. 180 tablet 1  . clorazepate (TRANXENE) 7.5 MG tablet Take 7.5 mg by mouth 2 (two) times daily as needed  for anxiety (for anxiety).    . dofetilide (TIKOSYN) 125 MCG capsule Take 125 mcg by mouth 2 (two) times daily. Takes along with a 250 mcg tablet to equal 375 mcg twice daliy    . dofetilide (TIKOSYN) 250 MCG capsule Take 1 capsule (250 mcg total) by mouth 2 (two) times daily. 180 capsule 0  . ezetimibe (ZETIA) 10 MG tablet Take 10 mg by mouth 3 (three) times a week.    . furosemide (LASIX) 40 MG tablet Take one tablet (40 mg) by mouth once daily except for 1 & 1/2 tablets (60 mg) by mouth Tuesday/ Thursday/ Saturday    . metolazone (ZAROXOLYN) 2.5 MG tablet Take one tablet (2.5 mg) by mouth once a week on Monday- take 30 minutes prior to  lasix (furosemide)    . SYNTHROID 100 MCG tablet TAKE ONE TABLET BY MOUTH ONCE DAILY BEFORE  BREAKFAST 90 tablet 3  . vitamin E 400 UNIT capsule Take 400 Units by mouth daily.     Marland Kitchen warfarin (COUMADIN) 5 MG tablet TAKE AS DIRECTED 90 tablet 1   No current facility-administered medications for this visit.    Allergies  Allergen Reactions  . Levofloxacin Other (See Comments)    Due to cardiac  AF  . Procaine Hcl Other (See Comments)    REACTION: smothers  . Spironolactone Diarrhea    Abdominal pain  . Amoxicillin Other (See Comments)    Rash   . Codeine Other (See Comments)    REACTION: nausea  . Niacin Other (See Comments)    REACTION: rash  . Ramipril Other (See Comments)    unknown  . Statins Other (See Comments)    REACTION: muscle aches    Review of Systems negative except from HPI and PMH  Physical Exam BP 120/72 mmHg  Pulse 74  Ht 5\' 3"  (1.6 m)  Wt 154 lb (69.854 kg)  BMI 27.29 kg/m2 Well developed and well nourished in no acute distress HENT normal E scleral and icterus clear Neck Supple Clear to ausculation Device pocket well healed; without hematoma or erythema.  There is no tethering Irregularly irregular rate and rhythm, no murmurs gallops or rub Soft with active bowel sounds No clubbing cyanosis  trace  Edema Alert and oriented, grossly normal motor and sensory function Skin Warm and Dry  ECG demonstrates AV pacing Interval 24/17/51 Axis leftward Assessment and  Plan  Hypertrophic cardiomyopathy  Left ventricular dysfunction  Implantable defibrillator-CRT-[employer] Jude  The patient's device was interrogated.  The information was reviewed. The AV delay was shortened on a paced AV 250--200  Atrial fibrillation -on dofetilide  Congestive heart failure-chronic-systolic    She is volume overloaded again. Metolazone has been useful. We will decrease the dose from 2.5--1.25 to be taken as needed. We will also increase her furosemide from 40/60--40/80  (taken twice a day).  Thankfully renal function has been stable.  Renal function however is not sufficiently good to maintain her on her current dose of dofetilide 375 twice daily. Hence, when she returns from her trip we will have her come back to clinic and we will transition her from dofetilide--amiodarone. We reviewed side effects again. We also talked about the value of AV junction ablation. Evidence demonstrates that with recurrence of atrial fibrillation she is V pacing about 70% of the time, i.e. she has lost resynchronization. AV junction ablation will be held in abeyance until we try the amiodarone.  She is agreeable to being seen by heart failure clinic after  she returns from her trip.

## 2014-09-13 NOTE — Patient Instructions (Signed)
Medication Instructions:  Your physician has recommended you make the following change in your medication:  1) CHANGE the way you take Furosemide -- Take 40 mg one day, next day take 40 mg twice that day, next day take 40 mg once, next day take 40 mg twice that day.  -- keep alternating this.   Labwork: None ordered  Testing/Procedures: None ordered  Follow-Up: Your physician recommends that you schedule a follow-up appointment in: 2-3 weeks with Chanetta Marshall, NP.  Your physician wants you to follow-up in: 6 months with Dr. Caryl Comes.. You will receive a reminder letter in the mail two months in advance. If you don't receive a letter, please call our office to schedule the follow-up appointment.  Any Other Special Instructions Will Be Listed Below (If Applicable). Thank you for choosing [facility]!!   Trinidad Curet, RN [phone]

## 2014-09-13 NOTE — Telephone Encounter (Signed)
The patient did follow up with Dr. Caryl Comes today.

## 2014-09-14 LAB — CUP PACEART INCLINIC DEVICE CHECK
Battery Remaining Longevity: 39.6 mo
Brady Statistic RA Percent Paced: 60 %
Brady Statistic RV Percent Paced: 89 %
Date Time Interrogation Session: 20160901143207
HIGH POWER IMPEDANCE MEASURED VALUE: 39.1449
Lead Channel Impedance Value: 350 Ohm
Lead Channel Impedance Value: 487.5 Ohm
Lead Channel Pacing Threshold Amplitude: 0.875 V
Lead Channel Sensing Intrinsic Amplitude: 11.8 mV
Lead Channel Setting Pacing Amplitude: 2 V
Lead Channel Setting Pacing Pulse Width: 0.5 ms
MDC IDC MSMT LEADCHNL LV PACING THRESHOLD AMPLITUDE: 0.75 V
MDC IDC MSMT LEADCHNL LV PACING THRESHOLD PULSEWIDTH: 0.5 ms
MDC IDC MSMT LEADCHNL RA IMPEDANCE VALUE: 387.5 Ohm
MDC IDC MSMT LEADCHNL RA SENSING INTR AMPL: 0.5 mV
MDC IDC MSMT LEADCHNL RV PACING THRESHOLD PULSEWIDTH: 0.5 ms
MDC IDC SET LEADCHNL RA PACING AMPLITUDE: 3.125
MDC IDC SET LEADCHNL RV PACING AMPLITUDE: 2 V
MDC IDC SET LEADCHNL RV PACING PULSEWIDTH: 0.5 ms
MDC IDC SET LEADCHNL RV SENSING SENSITIVITY: 0.5 mV
MDC IDC SET ZONE DETECTION INTERVAL: 260 ms
MDC IDC SET ZONE DETECTION INTERVAL: 350 ms
Pulse Gen Model: 3257
Pulse Gen Serial Number: 7032457
Zone Setting Detection Interval: 300 ms

## 2014-09-25 ENCOUNTER — Other Ambulatory Visit: Payer: Self-pay | Admitting: Family Medicine

## 2014-09-27 ENCOUNTER — Ambulatory Visit (INDEPENDENT_AMBULATORY_CARE_PROVIDER_SITE_OTHER): Payer: Medicare Other | Admitting: General Practice

## 2014-09-27 ENCOUNTER — Encounter: Payer: Self-pay | Admitting: Family Medicine

## 2014-09-27 ENCOUNTER — Ambulatory Visit (INDEPENDENT_AMBULATORY_CARE_PROVIDER_SITE_OTHER): Payer: Medicare Other | Admitting: Family Medicine

## 2014-09-27 VITALS — BP 130/82 | HR 80 | Temp 97.6°F | Wt 155.0 lb

## 2014-09-27 DIAGNOSIS — Z5181 Encounter for therapeutic drug level monitoring: Secondary | ICD-10-CM

## 2014-09-27 DIAGNOSIS — I5022 Chronic systolic (congestive) heart failure: Secondary | ICD-10-CM

## 2014-09-27 DIAGNOSIS — I421 Obstructive hypertrophic cardiomyopathy: Secondary | ICD-10-CM | POA: Diagnosis not present

## 2014-09-27 DIAGNOSIS — J069 Acute upper respiratory infection, unspecified: Secondary | ICD-10-CM | POA: Diagnosis not present

## 2014-09-27 DIAGNOSIS — I4891 Unspecified atrial fibrillation: Secondary | ICD-10-CM | POA: Diagnosis not present

## 2014-09-27 LAB — POCT INR: INR: 2.5

## 2014-09-27 MED ORDER — CEPHALEXIN 250 MG PO CAPS: 250.0000 mg | ORAL_CAPSULE | Freq: Two times a day (BID) | ORAL | Status: DC

## 2014-09-27 MED ORDER — FLUTICASONE PROPIONATE 50 MCG/ACT NA SUSP: 2.0000 | Freq: Every day | NASAL | Status: AC
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
## 2014-09-27 NOTE — Patient Instructions (Signed)
I suspect this is viral You do not have wheeze on exam- this is all coming from your upper airway/nasal system Your breathing is fine when you are able to calm down  Sent in keflex 250 mg to take twice a day for 7 days. Follow up if symptoms worsen or do not improve within 7 days

## 2014-09-27 NOTE — Progress Notes (Signed)
Pre visit review using our clinic review tool, if applicable. No additional management support is needed unless otherwise documented below in the visit note. 

## 2014-10-01 ENCOUNTER — Ambulatory Visit: Payer: Medicare Other

## 2014-10-02 ENCOUNTER — Telehealth: Payer: Self-pay | Admitting: Family Medicine

## 2014-10-02 ENCOUNTER — Encounter: Payer: Self-pay | Admitting: Internal Medicine

## 2014-10-02 NOTE — Telephone Encounter (Signed)
Pt states she is still coughing very very much.Has had several coughing spells.   Pt had a fever yesterday and thinks it may have broke b/c she broke out in a sweat today. Pt does not feel like eating and trying to drink. But its hard to drink much.  Pt would like to know what to do. Pt has not finished her antibiotic. Pt has plenty of cough syrup  [facility]

## 2014-10-02 NOTE — Telephone Encounter (Signed)
See below

## 2014-10-02 NOTE — Telephone Encounter (Signed)
Call patient but she did not pick up. Left voicemail.  I thought this was a viral upper respiratory infection unlikely to be helped by antibiotics. We ultimately agreed to trial antibiotics per patient's strong request and response to prior courses. My recommendation since she is not improving is that she follows up for a visit. We would need to consider chest x-ray also we need to reassess her fluid status. Could consider alternate antibiotics therapy potentially. This needs to be discussed in person and she is a high-risk patient and many interactions with other medicines.

## 2014-10-04 ENCOUNTER — Other Ambulatory Visit: Payer: Self-pay

## 2014-10-04 MED ORDER — DOFETILIDE 250 MCG PO CAPS: 250.0000 ug | ORAL_CAPSULE | Freq: Two times a day (BID) | ORAL | Status: DC

## 2014-10-04 NOTE — Telephone Encounter (Signed)
Sandra Sprang, MD at 09/13/2014 10:45 AM  Atrial fibrillation -on dofetilide dofetilide (TIKOSYN) 250 MCG capsule Take 1 capsule (250 mcg total) by mouth 2 (two) times dai  Take 125 mcg by mouth 2 (two) times daily. Takes along with a 250 mcg tablet to equal 375 mcg twice daliy

## 2014-10-11 ENCOUNTER — Ambulatory Visit: Payer: Medicare Other

## 2014-10-13 HISTORY — PX: PICC LINE PLACE PERIPHERAL (ARMC HX): HXRAD1248

## 2014-10-15 ENCOUNTER — Encounter: Payer: Self-pay | Admitting: *Deleted

## 2014-10-15 ENCOUNTER — Ambulatory Visit: Payer: Medicare Other | Admitting: Family Medicine

## 2014-10-15 ENCOUNTER — Ambulatory Visit (INDEPENDENT_AMBULATORY_CARE_PROVIDER_SITE_OTHER): Payer: Medicare Other | Admitting: General Practice

## 2014-10-15 DIAGNOSIS — Z5181 Encounter for therapeutic drug level monitoring: Secondary | ICD-10-CM

## 2014-10-15 LAB — POCT INR: INR: 3

## 2014-10-15 NOTE — Progress Notes (Signed)
Pre visit review using our clinic review tool, if applicable. No additional management support is needed unless otherwise documented below in the visit note. 

## 2014-10-17 ENCOUNTER — Ambulatory Visit (INDEPENDENT_AMBULATORY_CARE_PROVIDER_SITE_OTHER): Payer: Medicare Other | Admitting: Nurse Practitioner

## 2014-10-17 ENCOUNTER — Encounter: Payer: Self-pay | Admitting: Nurse Practitioner

## 2014-10-17 VITALS — BP 120/80 | HR 70 | Ht 63.0 in | Wt 152.6 lb

## 2014-10-17 DIAGNOSIS — I422 Other hypertrophic cardiomyopathy: Secondary | ICD-10-CM

## 2014-10-17 DIAGNOSIS — I481 Persistent atrial fibrillation: Secondary | ICD-10-CM | POA: Diagnosis not present

## 2014-10-17 DIAGNOSIS — I5022 Chronic systolic (congestive) heart failure: Secondary | ICD-10-CM

## 2014-10-17 DIAGNOSIS — I4819 Other persistent atrial fibrillation: Secondary | ICD-10-CM

## 2014-10-17 LAB — HEPATIC FUNCTION PANEL
ALBUMIN: 4.2 g/dL (ref 3.6–5.1)
ALK PHOS: 51 U/L (ref 33–130)
ALT: 19 U/L (ref 6–29)
AST: 28 U/L (ref 10–35)
BILIRUBIN INDIRECT: 0.7 mg/dL (ref 0.2–1.2)
BILIRUBIN TOTAL: 1.1 mg/dL (ref 0.2–1.2)
Bilirubin, Direct: 0.4 mg/dL — ABNORMAL HIGH (ref <=0.2)
Total Protein: 6.7 g/dL (ref 6.1–8.1)

## 2014-10-17 MED ORDER — AMIODARONE HCL 200 MG PO TABS: ORAL_TABLET | ORAL | Status: DC
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
## 2014-10-17 NOTE — Patient Instructions (Addendum)
Medication Instructions:   STOP TAKING TIKOSYN   START TAKING 200 MG AMIODARONE TWICE A DAY FOR ONLY 4 WEEKS    THE START TAKING  200 MG ONCE A DAY    Labwork: TODAY LFT   A WEEK BEFORE 10/29/14  RETURN FOR LABS BMET  CBC AND PT/INR    Testing/Procedures:  *YOU HAVE BEEN SCHEDULED FOR A  RIGHT HEART CATH WITH DR BENSIMHON    ON   10  /17  /16  @  9 AM   *PLEASE ARRIVE @ 7 AM   [place]TH [place] ENTRANCE TO BE DIRECTED TO        ADMITTING  *PLEASE HAVE NOTHING TO EAST OR DRINK AFTER MIDNIGHT THE NIGHT      BEFORE YOUR PROCEDURE   *HOLD YOUR COUMADIN TWO DAYS PRIOR TO YOUR PROCEDURE  STARTING Friday  10/26/14  *YOU MAY TAKE ALL YOUR MORNING MEDS WITH A SMALL AMOUNT OF WATER   * MAKE SURE YOU HAVE A CHANGE OF CLOTHING JUST  IN CASE YOU MAY    Follow-Up:  POST CATH FOLLOW UP  PHYS DEFIB WITH DR Caryl Comes  6 WEEKS AFTER 10/29/14    Any Other Special Instructions Will Be Listed Below (If Applicable).

## 2014-10-19 ENCOUNTER — Telehealth: Payer: Self-pay | Admitting: *Deleted

## 2014-10-19 ENCOUNTER — Telehealth: Payer: Self-pay | Admitting: Internal Medicine

## 2014-10-19 NOTE — Telephone Encounter (Signed)
-----   Message from Patsey Berthold, NP sent at 10/18/2014  6:15 AM EDT ----- Please notify patient of stable labs

## 2014-10-19 NOTE — Telephone Encounter (Signed)
Spoke with patient she stated she has had 3 doses of Amiodarone and has worsening in shortness of breath Shortness of breath is worse with exertion (up moving around) She has only had a 4 ounce weight gain so she does not feel in fluid related Stated that she did NOT feel like she was back in A fib and that she can always tell when she is No vital signs obtained

## 2014-10-19 NOTE — Telephone Encounter (Signed)
I spoke with Sandra Marshall, NP regarding the patient's symptoms this morning. Per [employer], stop amiodarone and DO NOT restart Tikosyn. Will obtain right heart cath as scheduled. I called and spoke with the patient and made her aware of Amber's recommendations. The patient is very concerned about going back in a-fib. She wants to know if she can just cut the amiodarone dose in half. She has taken her morning dose of amiodarone. I advised her to hold tonight's dose and if her symptoms are improved in the morning, she can try taking one tablet daily of her amiodarone. I have also advised her if her symptoms are not improved in the morning, she should stop amiodarone altogether and see if her symptoms improve. She is agreeable. She will call back on Monday if she has any difficulty over the weekend.

## 2014-10-19 NOTE — Telephone Encounter (Signed)
New message   Patient calling    Pt c/o medication issue:  1. Name of Medication: amiodarone 200 mg   2. How are you currently taking this medication (dosage and times per day)? Twice a day   3. Are you having a reaction (difficulty breathing--STAT)? Sob - in the office on Wednesday . - do not hear sob on phone .   4. What is your medication issue? Is this to be expected

## 2014-10-22 ENCOUNTER — Encounter: Payer: Medicare Other | Admitting: Nurse Practitioner

## 2014-10-22 ENCOUNTER — Telehealth: Payer: Self-pay | Admitting: Internal Medicine

## 2014-10-22 ENCOUNTER — Encounter: Payer: Medicare Other | Admitting: Internal Medicine

## 2014-10-22 ENCOUNTER — Other Ambulatory Visit (INDEPENDENT_AMBULATORY_CARE_PROVIDER_SITE_OTHER): Payer: Medicare Other | Admitting: *Deleted

## 2014-10-22 DIAGNOSIS — I5022 Chronic systolic (congestive) heart failure: Secondary | ICD-10-CM | POA: Diagnosis not present

## 2014-10-22 LAB — CUP PACEART INCLINIC DEVICE CHECK
Date Time Interrogation Session: 20161010075157
Lead Channel Setting Pacing Amplitude: 2 V
Lead Channel Setting Sensing Sensitivity: 0.5 mV
MDC IDC SET LEADCHNL LV PACING AMPLITUDE: 2 V
MDC IDC SET LEADCHNL LV PACING PULSEWIDTH: 0.5 ms
MDC IDC SET LEADCHNL RA PACING AMPLITUDE: 3.125
MDC IDC SET LEADCHNL RV PACING PULSEWIDTH: 0.5 ms
MDC IDC SET ZONE DETECTION INTERVAL: 260 ms
Pulse Gen Model: 3257
Pulse Gen Serial Number: 7032457
Zone Setting Detection Interval: 300 ms
Zone Setting Detection Interval: 350 ms

## 2014-10-22 LAB — BASIC METABOLIC PANEL
BUN: 28 mg/dL — ABNORMAL HIGH (ref 7–25)
CALCIUM: 9.8 mg/dL (ref 8.6–10.4)
CO2: 28 mmol/L (ref 20–31)
CREATININE: 1.73 mg/dL — AB (ref 0.60–0.88)
Chloride: 100 mmol/L (ref 98–110)
GLUCOSE: 81 mg/dL (ref 65–99)
Potassium: 3.6 mmol/L (ref 3.5–5.3)
SODIUM: 139 mmol/L (ref 135–146)

## 2014-10-22 LAB — CBC
HCT: 40.4 % (ref 36.0–46.0)
Hemoglobin: 13.4 g/dL (ref 12.0–15.0)
MCH: 28.4 pg (ref 26.0–34.0)
MCHC: 33.2 g/dL (ref 30.0–36.0)
MCV: 85.6 fL (ref 78.0–100.0)
MPV: 10.3 fL (ref 8.6–12.4)
PLATELETS: 220 10*3/uL (ref 150–400)
RBC: 4.72 MIL/uL (ref 3.87–5.11)
RDW: 16.3 % — ABNORMAL HIGH (ref 11.5–15.5)
WBC: 7.1 10*3/uL (ref 4.0–10.5)

## 2014-10-22 NOTE — Telephone Encounter (Signed)
Pt states her BP is 123/84 and her heart rate is 69 this morning. Pt states her main complaints are nausea and shortness of breath with activity Pt is asking if she should take amiodarone, what she should do about nausea and shortness of breath. Pt scheduled to have lab today, will wait for recommendations before coming for lab.  Pt advised I will forward to Chanetta Marshall, NP for review.

## 2014-10-22 NOTE — Telephone Encounter (Signed)
Pt states she was supposed to call Heather today.

## 2014-10-22 NOTE — Telephone Encounter (Signed)
New message    Patient calling has not gotten much better since Friday was told by nurse to call back again on Monday.    No chest pain,   Pt c/o Shortness Of Breath: STAT if SOB developed within the last 24 hours or pt is noticeably SOB on the phone  1. Are you currently SOB (can you hear that pt is SOB on the phone)? No - last night   2. How long have you been experiencing SOB? When she was in the office. Medication was taken off   3. Are you SOB when sitting or when up moving around?  Moving around   4. Are you currently experiencing any other symptoms? Sick on stomach - since starting on new medication     Pt c/o medication issue:  1. Name of Medication:  Amiodarone 200 mg  2. How are you currently taking this medication (dosage and times per day)? On Friday took  400 mg - suggest she took 200 mg in am one in pm,   3. Are you having a reaction (difficulty breathing--STAT)? Sob on last night she feels much better.    4. What is your medication issue? Still feels bad since starting new medication

## 2014-10-22 NOTE — Telephone Encounter (Signed)
Pt advised,verbalized understanding. 

## 2014-10-22 NOTE — Telephone Encounter (Signed)
Stop amiodarone for now Weigh daily Will re-evaluate after RHC done on Monday with Dr Haroldine Laws  Chanetta Marshall, NP 10/22/2014 11:30 AM

## 2014-10-22 NOTE — Telephone Encounter (Signed)
Pt states she took  amiodarone 200 mg twice a day on Thursday. Pt states Friday, Saturday, and Sunday she took amiodarone 200 mg daily. Pt states she has not taken any amiodarone today.  Pt states she is nauseated. Pt states she is short of breath today, especially when she tries to do any physical exertion. Pt states she took a metolazone on Thursday or Friday because her weight was up to 151lbs on her scales, her weight is down 2 pounds today so she feels her SOB is not related to extra fluid.

## 2014-10-23 ENCOUNTER — Telehealth: Payer: Self-pay | Admitting: Cardiology

## 2014-10-23 ENCOUNTER — Telehealth: Payer: Self-pay | Admitting: *Deleted

## 2014-10-23 ENCOUNTER — Emergency Department (HOSPITAL_COMMUNITY): Payer: Medicare Other

## 2014-10-23 ENCOUNTER — Emergency Department (HOSPITAL_COMMUNITY)
Admission: EM | Admit: 2014-10-23 | Discharge: 2014-10-24 | Disposition: A | Discharge status: Home / Self Care | Payer: Medicare Other | Attending: Emergency Medicine | Admitting: Emergency Medicine

## 2014-10-23 ENCOUNTER — Encounter (HOSPITAL_COMMUNITY): Payer: Self-pay | Admitting: Emergency Medicine

## 2014-10-23 ENCOUNTER — Other Ambulatory Visit: Payer: Self-pay

## 2014-10-23 DIAGNOSIS — Z87891 Personal history of nicotine dependence: Secondary | ICD-10-CM | POA: Diagnosis not present

## 2014-10-23 DIAGNOSIS — Z79899 Other long term (current) drug therapy: Secondary | ICD-10-CM | POA: Diagnosis not present

## 2014-10-23 DIAGNOSIS — E785 Hyperlipidemia, unspecified: Secondary | ICD-10-CM | POA: Diagnosis not present

## 2014-10-23 DIAGNOSIS — I5020 Unspecified systolic (congestive) heart failure: Secondary | ICD-10-CM | POA: Diagnosis not present

## 2014-10-23 DIAGNOSIS — I10 Essential (primary) hypertension: Secondary | ICD-10-CM | POA: Diagnosis not present

## 2014-10-23 DIAGNOSIS — Z86010 Personal history of colonic polyps: Secondary | ICD-10-CM | POA: Diagnosis not present

## 2014-10-23 DIAGNOSIS — E039 Hypothyroidism, unspecified: Secondary | ICD-10-CM | POA: Diagnosis not present

## 2014-10-23 DIAGNOSIS — R002 Palpitations: Secondary | ICD-10-CM | POA: Diagnosis present

## 2014-10-23 DIAGNOSIS — I480 Paroxysmal atrial fibrillation: Secondary | ICD-10-CM | POA: Diagnosis not present

## 2014-10-23 LAB — BASIC METABOLIC PANEL
Anion gap: 10 (ref 5–15)
BUN: 32 mg/dL — AB (ref 6–20)
CHLORIDE: 104 mmol/L (ref 101–111)
CO2: 27 mmol/L (ref 22–32)
CREATININE: 1.94 mg/dL — AB (ref 0.44–1.00)
Calcium: 9.5 mg/dL (ref 8.9–10.3)
GFR calc Af Amer: 27 mL/min — ABNORMAL LOW (ref >60)
GFR calc non Af Amer: 23 mL/min — ABNORMAL LOW (ref >60)
GLUCOSE: 103 mg/dL — AB (ref 65–99)
POTASSIUM: 4 mmol/L (ref 3.5–5.1)
SODIUM: 141 mmol/L (ref 135–145)

## 2014-10-23 LAB — CBC
HEMATOCRIT: 40.9 % (ref 36.0–46.0)
Hemoglobin: 12.7 g/dL (ref 12.0–15.0)
MCH: 27.9 pg (ref 26.0–34.0)
MCHC: 31.1 g/dL (ref 30.0–36.0)
MCV: 89.9 fL (ref 78.0–100.0)
PLATELETS: 222 10*3/uL (ref 150–400)
RBC: 4.55 MIL/uL (ref 3.87–5.11)
RDW: 16.2 % — AB (ref 11.5–15.5)
WBC: 7.1 10*3/uL (ref 4.0–10.5)

## 2014-10-23 LAB — PROTIME-INR
INR: 2.04 — ABNORMAL HIGH (ref <1.50)
INR: 2.41 — ABNORMAL HIGH (ref 0.00–1.49)
PROTHROMBIN TIME: 23.3 s — AB (ref 11.6–15.2)
Prothrombin Time: 26 seconds — ABNORMAL HIGH (ref 11.6–15.2)

## 2014-10-23 LAB — I-STAT TROPONIN, ED: TROPONIN I, POC: 0.06 ng/mL (ref 0.00–0.08)

## 2014-10-23 NOTE — Telephone Encounter (Signed)
Pt called with HR up to 336- this is per a device she has to check her pulse.  Other times her HR is at 56.  She is very anxious and afraid her device will shock her.  Instructed to come to ER by EMS.

## 2014-10-23 NOTE — ED Provider Notes (Signed)
By signing my name below, I, Altamease Oiler, attest that this documentation has been prepared under the direction and in the presence of [place], DO. Electronically Signed: Altamease Oiler, ED Scribe. 10/24/2014. 2:59 AM.  TIME SEEN: 11:53 PM  CHIEF COMPLAINT: palpitations  HPI: Sandra [name] is a 81 y.o. female with PMHx of a-fib, cardiomyopathy, heart failure with pacemaker/AICD, HTN, HLD, and hypothyroidism who presents to the Emergency Department complaining of palpitations with onset this morning. Pt states that today her heart rate was "up and down" on her home oximeter.  She stopped Tikosyn and started amiodarone last week on Thursday 10/18/14 because her doctor was concerned for her kidneys. Yesterday she stopped the amiodarone at her cardiologist's instruction because she was nauseous and SOB. States she has taken beta blockers in the past but they made her feel like she was too weak. States she does not think she has ever been on diltiazem. She is on Coumadin. Associated symptoms include light headedness, SOB, and intermittent chest heaviness. Pt denies chest pain currently. She has a right heart cardiac catheterization scheduled for 10/29/14. Cardiologist: Dr. Caryl Comes.  ROS: See HPI Constitutional: no fever  Eyes: no drainage  ENT: no runny nose   Cardiovascular:   chest pain  Resp:  SOB  GI: no vomiting GU: no dysuria Integumentary: no rash  Allergy: no hives  Musculoskeletal: no leg swelling  Neurological: no slurred speech ROS otherwise negative  PAST MEDICAL HISTORY/PAST SURGICAL HISTORY:  Past Medical History  Diagnosis Date  . Atrial fibrillation (HCC)     on Tikosyn and Coumadin  . Hyperlipidemia   . Hypothyroidism   . Bradycardia   . Cardiomyopathy, hypertrophic nonobstructive (HCC)     ejection fraction 25%  . Anal fissure   . Adenomatous colon polyp   . Diverticulosis   . ICD-CRT     generator change 2013  . Systolic heart failure   . History of  colonoscopy   . Migraines   . Hyperlipidemia   . Hypertension   . Cardiac arrhythmia due to congenital heart disease     MEDICATIONS:  Prior to Admission medications   Medication Sig Start Date End Date Taking? Authorizing Provider  albuterol (PROVENTIL HFA;VENTOLIN HFA) 108 (90 BASE) MCG/ACT inhaler Inhale 2 puffs into the lungs every 6 (six) hours as needed for wheezing or shortness of breath. 10/02/13  Yes Marin Olp, MD  b complex vitamins tablet Take 1 tablet by mouth every Monday, Wednesday, and Friday. Take one tablet by mouth three times a week   Yes Historical Provider, MD  benazepril (LOTENSIN) 10 MG tablet TAKE ONE TABLET BY MOUTH TWICE DAILY 04/02/14  Yes Marin Olp, MD  carvedilol (COREG) 12.5 MG tablet Take 1 tablet (12.5 mg total) by mouth 2 (two) times daily with a meal. 04/18/14  Yes Deboraha Sprang, MD  clorazepate (TRANXENE) 7.5 MG tablet Take 7.5 mg by mouth 2 (two) times daily as needed for anxiety (for anxiety).   Yes Historical Provider, MD  ezetimibe (ZETIA) 10 MG tablet Take 10 mg by mouth every Monday, Wednesday, and Friday.    Yes Historical Provider, MD  fluticasone (FLONASE) 50 MCG/ACT nasal spray Place 2 sprays into both nostrils daily. 09/27/14  Yes Marin Olp, MD  furosemide (LASIX) 40 MG tablet Take 20-40 mg by mouth 2 (two) times daily. 20 mg in the morning and 40 mg in the evening   Yes   SYNTHROID 100 MCG tablet TAKE ONE TABLET  BY MOUTH ONCE DAILY BEFORE  BREAKFAST Patient taking differently: TAKE ONE TABLET BY MOUTH ONCE DAILY AT BEDTIME 08/04/14  Yes Marin Olp, MD  vitamin E 400 UNIT capsule Take 400 Units by mouth daily.    Yes Historical Provider, MD  warfarin (COUMADIN) 5 MG tablet TAKE AS DIRECTED Patient taking differently: Take 5 mg by mouth on Monday and Friday. Take 2.5 mg by mouth daily on all other days. 02/05/14  Yes Kennyth Arnold, FNP    ALLERGIES:  Allergies  Allergen Reactions  . Levofloxacin Other (See Comments)     Due to cardiac  AF  . Procaine Hcl Other (See Comments)    REACTION: smothers  . Spironolactone Diarrhea    Abdominal pain  . Amiodarone Nausea And Vomiting  . Amoxicillin Other (See Comments)    Rash   . Codeine Other (See Comments)    REACTION: nausea  . Niacin Other (See Comments)    REACTION: rash  . Ramipril Other (See Comments)    unknown  . Statins Other (See Comments)    REACTION: muscle aches    SOCIAL HISTORY:  Social History  Substance Use Topics  . Smoking status: Former Smoker -- 0.20 packs/day for 19 years    Types: Cigarettes    Quit date: 01/13/1987  . Smokeless tobacco: Never Used  . Alcohol Use: No    FAMILY HISTORY: Family History  Problem Relation Age of Onset  . Heart disease Mother   . Colon cancer Father   . COPD Father   . Heart disease Father   . Cardiomyopathy Daughter   . Breast cancer Paternal Aunt   . Colon polyps Father   . Diabetes Neg Hx   . Kidney disease Neg Hx     EXAM: BP 128/79 mmHg  Pulse 70  Temp(Src) 97.7 F (36.5 C) (Oral)  Resp 20  SpO2 95% CONSTITUTIONAL: Alert and oriented and responds appropriately to questions. Well-appearing; well-nourished, elderly, appears uncomfortable but non-toxic HEAD: Normocephalic EYES: Conjunctivae clear, PERRL ENT: normal nose; no rhinorrhea; moist mucous membranes; pharynx without lesions noted NECK: Supple, no meningismus, no LAD  CARD: irregularly irregular; S1 and S2 appreciated; no murmurs, no clicks, no rubs, no gallops RESP: Normal chest excursion without splinting or tachypnea; breath sounds clear and equal bilaterally; no wheezes, no rhonchi, no rales, no hypoxia or respiratory distress, speaking full sentences ABD/GI: Normal bowel sounds; non-distended; soft, non-tender, no rebound, no guarding, no peritoneal signs BACK:  The back appears normal and is non-tender to palpation, there is no CVA tenderness EXT: Normal ROM in all joints; non-tender to palpation; no edema; normal  capillary refill; no cyanosis, no calf tenderness or swelling    SKIN: Normal color for age and race; warm NEURO: Moves all extremities equally, sensation to light touch intact diffusely, cranial nerves II through XII intact PSYCH: The patient's mood and manner are appropriate. Grooming and personal hygiene are appropriate.  MEDICAL DECISION MAKING: Patient here with chest pain, shortness of breath and palpitations. She has history of paroxysmal atrial fibrillation and appears to be back in A. fib. Was recently taken off amiodarone yesterday and Tikosyn last week. Is not on any antiarrhythmic currently.  Patient is on Coumadin. EKG shows a flutter versus atrial fibrillation. She is being paced.  She does not appear volume overload on exam. No infectious symptoms. We'll obtain cardiac labs to evaluate for ACS. We'll also obtain chest x-ray and consult cardiology.  ED PROGRESS:   12:41 AM-Consult complete  with Dr. Philbert Riser (Cardiology). Patient case explained and discussed. He states given patient's paroxysmal atrial fibrillation and that she will likely go back into atrial fibrillation he does not think that cardioversion at this time would be helpful. Discussed starting patient on diltiazem but at this time he feels given patient has had many side effects from multiple different medications it may be better for her to follow-up with Dr. Caryl Comes tomorrow morning. Patient's first troponin is negative. Her chest x-ray is clear. She is rate controlled while at rest with a heart rate in the 70s and a normal blood pressure. She is not currently having any chest pain. Plan will to be to obtain a second troponin and continue to monitor patient. If she still asymptomatic and rate control she can follow-up with her cardiologist tomorrow per Dr. Madison Hickman recommendations. She is comfortable with this plan as are her daughters at bedside. Patient would prefer discharge home.  12:50AM I re-evaluated the patient and  provided an update on the my discussion with Cardiology.    3:20 AM  Second troponin is negative. Pacemaker has been interrogated and it shows that the patient has been in atrial fibrillation since 5:30 PM on the 10th. No other arrhythmia seen. No pacemaker failure.  Discussed this with patient and daughter. We'll discharge patient home. She will follow-up with Dr. Caryl Comes today. Discussed strict return precautions. She verbalizes understanding and is comfortable with this plan.    EKG Interpretation  Date/Time:  Tuesday October 23 2014 18:48:20 EDT Ventricular Rate:  74 PR Interval:    QRS Duration: 162 QT Interval:  438 QTC Calculation: 486 R Axis:   -80 Text Interpretation:  Suspect unspecified pacemaker failure Atrial fibrillation with frequent ventricular-paced complexes Right bundle branch block Left anterior fascicular block Bifascicular block T wave abnormality, consider lateral ischemia Abnormal ECG Confirmed by Bomani Oommen,  DO, Kengo Sturges ([id number]) on 10/23/2014 11:07:09 PM        I personally performed the services described in this documentation, which was scribed in my presence. The recorded information has been reviewed and is accurate.      Jean Lafitte, DO 10/24/14 [phone]

## 2014-10-23 NOTE — Telephone Encounter (Signed)
-----   Message from Patsey Berthold, NP sent at 10/23/2014 11:15 AM EDT ----- Please notify patient of stable lab results. Please also tell her not to hold Coumadin prior to heart cath on Monday - ok to stay on Coumadin per discussion with Dr Haroldine Laws today

## 2014-10-23 NOTE — ED Notes (Signed)
Pt sts palpitations and CP today; pt recently had med changed and stopped afib meds

## 2014-10-24 DIAGNOSIS — I480 Paroxysmal atrial fibrillation: Secondary | ICD-10-CM | POA: Diagnosis not present

## 2014-10-24 LAB — I-STAT TROPONIN, ED: Troponin i, poc: 0.07 ng/mL (ref 0.00–0.08)

## 2014-10-24 NOTE — ED Notes (Signed)
Spoke with Otila Kluver at [place], per Otila Kluver patient has been in AFIB since 10/22/14 at 1830. Pt has not received any shocks and everything else appears normal. Dr. Leonides Schanz notified.

## 2014-10-24 NOTE — Telephone Encounter (Signed)
Lorenda Hatchet, EP Scheduler states she will contact patient for appointment time tomorrow

## 2014-10-24 NOTE — Telephone Encounter (Signed)
Spoke with patient who states she had a terrible night.  She states she stopped Amiodarone on Sunday due to nausea.  States she felt good on Monday then on Tuesday she began to experience fatigue and SOB.  States at about 5:00 pm she got up to feed her cat and she felt like she was going to pass out; states checked her pulse which was >250 bpm.  Patient called our on-call provider and was advised to go to [facility] ED.  She was treated there but was advised due to her sensitivity to medications, they did not want to prescribe anything without Dr. Olin Pia approval.  She was d/c'ed advised to call our office this morning.  I advised her that Dr. Caryl Comes is not in the office today but that I will discuss with Dr. Lovena Le who is DOD.  She requests that if an appointment is needed that she needs to be seen in the afternoon.  I advised her that I will call back with Dr. Tanna Furry advice.

## 2014-10-24 NOTE — Discharge Instructions (Signed)

## 2014-10-24 NOTE — Telephone Encounter (Signed)
I reviewed patient's complaint with Dr. Lovena Le, DOD who advised that patient take 1 1/2 of her Carvedilol 12.5 mg tablets.  I called and spoke with patient who states she was told by Dr. Caryl Comes in the past not to ever take more than 12.5 mg of Carvedilol twice daily.  I advised her that in that case, I will have to forward message to Dr. Caryl Comes and Chanetta Marshall, NP for advice.  Patient states she does not feel like she can take any dose of Amiodarone.  Her daughter is getting married on Saturday and she is concerned about feeling well at the wedding.  I advised her that I will call her back with Dr. Caryl Comes or Amber's advice.  She verbalized understanding and agreement.

## 2014-10-24 NOTE — Telephone Encounter (Signed)
Called patient and advised that we will schedule appointment with Dr. Caryl Comes for tomorrow.  I advised her that only opening is 4:15 pm.  She states she can come at that time, but would like to come earlier if possible.  I attempted to call Lorenda Hatchet, EP scheduler but was unable to reach her.  I advised patient that I will talk with Melissa and one of [place] will call her back to schedule.  She requests to call cell number first and if we don't reach her leaver her a message on home answering machine.

## 2014-10-24 NOTE — ED Notes (Signed)
[employer] pacemaker interrogated at this time.  

## 2014-10-24 NOTE — Telephone Encounter (Signed)
Please add her on to Dr Olin Pia schedule tomorrow to be seen  Thank you

## 2014-10-24 NOTE — Telephone Encounter (Signed)
F/u    Pt was seen in the ED last night for afib and wish to speak to nurse. Please call pt.

## 2014-10-24 NOTE — ED Notes (Signed)
Patient left at this time with all belongings. 

## 2014-10-24 NOTE — ED Notes (Signed)
See downtime documentation.  Pt's ICD has been re interrogated d/t lack of records from [employer]. [employer] rep has been contacted.

## 2014-10-29 ENCOUNTER — Encounter (HOSPITAL_COMMUNITY)
Admission: RE | Disposition: A | Discharge status: Home Health | Payer: Medicare Other | Source: Ambulatory Visit | Attending: Internal Medicine

## 2014-10-29 ENCOUNTER — Encounter (HOSPITAL_COMMUNITY): Payer: Self-pay | Admitting: Internal Medicine

## 2014-10-29 ENCOUNTER — Inpatient Hospital Stay (HOSPITAL_COMMUNITY)
Admission: RE | Admit: 2014-10-29 | Discharge: 2014-11-13 | DRG: 286 | Disposition: A | Discharge status: Home Health | Payer: Medicare Other | Source: Ambulatory Visit | Attending: Internal Medicine | Admitting: Internal Medicine

## 2014-10-29 DIAGNOSIS — E785 Hyperlipidemia, unspecified: Secondary | ICD-10-CM | POA: Diagnosis present

## 2014-10-29 DIAGNOSIS — Z79899 Other long term (current) drug therapy: Secondary | ICD-10-CM | POA: Diagnosis not present

## 2014-10-29 DIAGNOSIS — Z95810 Presence of automatic (implantable) cardiac defibrillator: Secondary | ICD-10-CM | POA: Diagnosis not present

## 2014-10-29 DIAGNOSIS — Z881 Allergy status to other antibiotic agents status: Secondary | ICD-10-CM

## 2014-10-29 DIAGNOSIS — Z7901 Long term (current) use of anticoagulants: Secondary | ICD-10-CM | POA: Diagnosis not present

## 2014-10-29 DIAGNOSIS — Z8249 Family history of ischemic heart disease and other diseases of the circulatory system: Secondary | ICD-10-CM | POA: Diagnosis not present

## 2014-10-29 DIAGNOSIS — Z800 Family history of malignant neoplasm of digestive organs: Secondary | ICD-10-CM

## 2014-10-29 DIAGNOSIS — T462X5A Adverse effect of other antidysrhythmic drugs, initial encounter: Secondary | ICD-10-CM | POA: Diagnosis not present

## 2014-10-29 DIAGNOSIS — Z885 Allergy status to narcotic agent status: Secondary | ICD-10-CM

## 2014-10-29 DIAGNOSIS — I5023 Acute on chronic systolic (congestive) heart failure: Secondary | ICD-10-CM | POA: Diagnosis present

## 2014-10-29 DIAGNOSIS — I130 Hypertensive heart and chronic kidney disease with heart failure and stage 1 through stage 4 chronic kidney disease, or unspecified chronic kidney disease: Secondary | ICD-10-CM | POA: Diagnosis present

## 2014-10-29 DIAGNOSIS — I481 Persistent atrial fibrillation: Secondary | ICD-10-CM | POA: Diagnosis present

## 2014-10-29 DIAGNOSIS — R570 Cardiogenic shock: Secondary | ICD-10-CM | POA: Diagnosis present

## 2014-10-29 DIAGNOSIS — I272 Other secondary pulmonary hypertension: Secondary | ICD-10-CM | POA: Diagnosis present

## 2014-10-29 DIAGNOSIS — Z8371 Family history of colonic polyps: Secondary | ICD-10-CM | POA: Diagnosis not present

## 2014-10-29 DIAGNOSIS — I452 Bifascicular block: Secondary | ICD-10-CM | POA: Diagnosis present

## 2014-10-29 DIAGNOSIS — I4510 Unspecified right bundle-branch block: Secondary | ICD-10-CM | POA: Diagnosis present

## 2014-10-29 DIAGNOSIS — I421 Obstructive hypertrophic cardiomyopathy: Secondary | ICD-10-CM | POA: Diagnosis present

## 2014-10-29 DIAGNOSIS — Z87891 Personal history of nicotine dependence: Secondary | ICD-10-CM | POA: Diagnosis not present

## 2014-10-29 DIAGNOSIS — N390 Urinary tract infection, site not specified: Secondary | ICD-10-CM | POA: Diagnosis present

## 2014-10-29 DIAGNOSIS — R112 Nausea with vomiting, unspecified: Secondary | ICD-10-CM | POA: Diagnosis not present

## 2014-10-29 DIAGNOSIS — N184 Chronic kidney disease, stage 4 (severe): Secondary | ICD-10-CM | POA: Diagnosis present

## 2014-10-29 DIAGNOSIS — E039 Hypothyroidism, unspecified: Secondary | ICD-10-CM | POA: Diagnosis present

## 2014-10-29 DIAGNOSIS — R51 Headache: Secondary | ICD-10-CM | POA: Diagnosis not present

## 2014-10-29 DIAGNOSIS — I4819 Other persistent atrial fibrillation: Secondary | ICD-10-CM | POA: Insufficient documentation

## 2014-10-29 DIAGNOSIS — Z888 Allergy status to other drugs, medicaments and biological substances status: Secondary | ICD-10-CM | POA: Diagnosis not present

## 2014-10-29 DIAGNOSIS — I480 Paroxysmal atrial fibrillation: Secondary | ICD-10-CM | POA: Diagnosis not present

## 2014-10-29 HISTORY — DX: Chronic kidney disease, stage 3 unspecified: N18.30

## 2014-10-29 HISTORY — DX: Bifascicular block: I45.2

## 2014-10-29 HISTORY — DX: Chronic kidney disease, stage 3 (moderate): N18.3

## 2014-10-29 HISTORY — PX: CARDIAC CATHETERIZATION: SHX172

## 2014-10-29 LAB — BASIC METABOLIC PANEL
Anion gap: 12 (ref 5–15)
Anion gap: 8 (ref 5–15)
BUN: 38 mg/dL — AB (ref 6–20)
BUN: 36 mg/dL — AB (ref 6–20)
CALCIUM: 8.3 mg/dL — AB (ref 8.9–10.3)
CHLORIDE: 97 mmol/L — AB (ref 101–111)
CO2: 25 mmol/L (ref 22–32)
CO2: 27 mmol/L (ref 22–32)
CREATININE: 1.83 mg/dL — AB (ref 0.44–1.00)
Calcium: 9.1 mg/dL (ref 8.9–10.3)
Chloride: 101 mmol/L (ref 101–111)
Creatinine, Ser: 1.84 mg/dL — ABNORMAL HIGH (ref 0.44–1.00)
GFR calc Af Amer: 29 mL/min — ABNORMAL LOW (ref >60)
GFR calc Af Amer: 29 mL/min — ABNORMAL LOW (ref >60)
GFR calc non Af Amer: 25 mL/min — ABNORMAL LOW (ref >60)
GFR, EST NON AFRICAN AMERICAN: 25 mL/min — AB (ref >60)
GLUCOSE: 98 mg/dL (ref 65–99)
Glucose, Bld: 133 mg/dL — ABNORMAL HIGH (ref 65–99)
POTASSIUM: 3.6 mmol/L (ref 3.5–5.1)
Potassium: 4.5 mmol/L (ref 3.5–5.1)
Sodium: 138 mmol/L (ref 135–145)
Sodium: 132 mmol/L — ABNORMAL LOW (ref 135–145)

## 2014-10-29 LAB — POCT I-STAT 3, VENOUS BLOOD GAS (G3P V)
ACID-BASE EXCESS: 2 mmol/L (ref 0.0–2.0)
BICARBONATE: 25.5 meq/L — AB (ref 20.0–24.0)
Bicarbonate: 27 mEq/L — ABNORMAL HIGH (ref 20.0–24.0)
O2 SAT: 35 %
O2 SAT: 38 %
PCO2 VEN: 43.5 mmHg — AB (ref 45.0–50.0)
PH VEN: 7.372 — AB (ref 7.250–7.300)
TCO2: 27 mmol/L (ref 0–100)
TCO2: 28 mmol/L (ref 0–100)
pCO2, Ven: 43.9 mmHg — ABNORMAL LOW (ref 45.0–50.0)
pH, Ven: 7.4 — ABNORMAL HIGH (ref 7.250–7.300)
pO2, Ven: 21 mmHg — CL (ref 30.0–45.0)
pO2, Ven: 23 mmHg — CL (ref 30.0–45.0)

## 2014-10-29 LAB — PROTIME-INR
INR: 2.39 — ABNORMAL HIGH (ref 0.00–1.49)
Prothrombin Time: 25.8 seconds — ABNORMAL HIGH (ref 11.6–15.2)

## 2014-10-29 LAB — CBC
HEMATOCRIT: 38.8 % (ref 36.0–46.0)
Hemoglobin: 12.2 g/dL (ref 12.0–15.0)
MCH: 28.1 pg (ref 26.0–34.0)
MCHC: 31.4 g/dL (ref 30.0–36.0)
MCV: 89.4 fL (ref 78.0–100.0)
PLATELETS: 176 10*3/uL (ref 150–400)
RBC: 4.34 MIL/uL (ref 3.87–5.11)
RDW: 16.1 % — ABNORMAL HIGH (ref 11.5–15.5)
WBC: 6.3 10*3/uL (ref 4.0–10.5)

## 2014-10-29 LAB — MRSA PCR SCREENING: MRSA BY PCR: NEGATIVE

## 2014-10-29 LAB — CARBOXYHEMOGLOBIN
Carboxyhemoglobin: 1 % (ref 0.5–1.5)
METHEMOGLOBIN: 0.8 % (ref 0.0–1.5)
O2 SAT: 49.7 %
TOTAL HEMOGLOBIN: 11.3 g/dL — AB (ref 12.0–16.0)

## 2014-10-29 LAB — MAGNESIUM: MAGNESIUM: 2.3 mg/dL (ref 1.7–2.4)

## 2014-10-29 SURGERY — RIGHT HEART CATH
Anesthesia: LOCAL

## 2014-10-29 MED ORDER — WARFARIN SODIUM 5 MG PO TABS
5.0000 mg | ORAL_TABLET | ORAL | Status: DC
  Administered 2014-10-29: 5 mg via ORAL
  Filled 2014-10-29: qty 1

## 2014-10-29 MED ORDER — INFLUENZA VAC SPLIT QUAD 0.5 ML IM SUSY
0.5000 mL | PREFILLED_SYRINGE | INTRAMUSCULAR | Status: AC
  Administered 2014-10-30: 0.5 mL via INTRAMUSCULAR
  Filled 2014-10-29: qty 0.5

## 2014-10-29 MED ORDER — SODIUM CHLORIDE 0.9 % IJ SOLN
3.0000 mL | Freq: Two times a day (BID) | INTRAMUSCULAR | Status: DC
  Administered 2014-10-29 (×2): 3 mL via INTRAVENOUS

## 2014-10-29 MED ORDER — ONDANSETRON HCL 4 MG/2ML IJ SOLN
4.0000 mg | Freq: Four times a day (QID) | INTRAMUSCULAR | Status: DC | PRN
  Administered 2014-10-30 – 2014-10-31 (×5): 4 mg via INTRAVENOUS
  Filled 2014-10-29 (×2): qty 2

## 2014-10-29 MED ORDER — WARFARIN - PHARMACIST DOSING INPATIENT
Freq: Every day | Status: DC
  Administered 2014-10-29: 18:00:00
  Administered 2014-10-31: 1
  Administered 2014-11-08 – 2014-11-10 (×3)

## 2014-10-29 MED ORDER — FUROSEMIDE 10 MG/ML IJ SOLN
40.0000 mg | Freq: Two times a day (BID) | INTRAMUSCULAR | Status: DC
  Administered 2014-10-29 – 2014-10-30 (×2): 40 mg via INTRAVENOUS
  Filled 2014-10-29 (×3): qty 4

## 2014-10-29 MED ORDER — SODIUM CHLORIDE 0.9 % IV SOLN: 250.0000 mL | INTRAVENOUS | Status: DC | PRN

## 2014-10-29 MED ORDER — SODIUM CHLORIDE 0.9 % IV SOLN: INTRAVENOUS | Status: DC

## 2014-10-29 MED ORDER — SODIUM CHLORIDE 0.9 % IJ SOLN
3.0000 mL | Freq: Two times a day (BID) | INTRAMUSCULAR | Status: DC
  Administered 2014-10-29: 3 mL via INTRAVENOUS

## 2014-10-29 MED ORDER — SODIUM CHLORIDE 0.9 % IJ SOLN: 3.0000 mL | INTRAMUSCULAR | Status: DC | PRN

## 2014-10-29 MED ORDER — FLUTICASONE PROPIONATE 50 MCG/ACT NA SUSP
2.0000 | Freq: Every day | NASAL | Status: DC
  Filled 2014-10-29 (×2): qty 16

## 2014-10-29 MED ORDER — WARFARIN SODIUM 2.5 MG PO TABS
2.5000 mg | ORAL_TABLET | ORAL | Status: DC
  Administered 2014-10-30 – 2014-10-31 (×2): 2.5 mg via ORAL
  Filled 2014-10-29 (×3): qty 1

## 2014-10-29 MED ORDER — SODIUM CHLORIDE 0.9 % IJ SOLN: 3.0000 mL | Freq: Two times a day (BID) | INTRAMUSCULAR | Status: DC

## 2014-10-29 MED ORDER — SODIUM CHLORIDE 0.9 % IV SOLN
250.0000 mL | INTRAVENOUS | Status: DC | PRN
  Administered 2014-10-29: 250 mL via INTRAVENOUS

## 2014-10-29 MED ORDER — FENTANYL CITRATE (PF) 100 MCG/2ML IJ SOLN
INTRAMUSCULAR | Status: AC
  Filled 2014-10-29: qty 4

## 2014-10-29 MED ORDER — FUROSEMIDE 10 MG/ML IJ SOLN
40.0000 mg | INTRAMUSCULAR | Status: AC
  Administered 2014-10-29: 40 mg via INTRAVENOUS
  Filled 2014-10-29: qty 4

## 2014-10-29 MED ORDER — MILRINONE IN DEXTROSE 20 MG/100ML IV SOLN
0.2500 ug/kg/min | INTRAVENOUS | Status: DC
  Administered 2014-10-29 – 2014-10-30 (×3): 0.25 ug/kg/min via INTRAVENOUS
  Filled 2014-10-29: qty 200
  Filled 2014-10-29: qty 100

## 2014-10-29 MED ORDER — FENTANYL CITRATE (PF) 100 MCG/2ML IJ SOLN
INTRAMUSCULAR | Status: DC | PRN
  Administered 2014-10-29: 25 ug via INTRAVENOUS

## 2014-10-29 MED ORDER — LEVOTHYROXINE SODIUM 100 MCG PO TABS
100.0000 ug | ORAL_TABLET | Freq: Every day | ORAL | Status: DC
  Administered 2014-10-30 – 2014-11-13 (×15): 100 ug via ORAL
  Filled 2014-10-29 (×15): qty 1

## 2014-10-29 MED ORDER — ACETAMINOPHEN 325 MG PO TABS
650.0000 mg | ORAL_TABLET | ORAL | Status: DC | PRN
  Filled 2014-10-29: qty 2

## 2014-10-29 MED ORDER — LIDOCAINE HCL (PF) 1 % IJ SOLN
INTRAMUSCULAR | Status: DC | PRN
  Administered 2014-10-29: 15 mL via SUBCUTANEOUS

## 2014-10-29 MED ORDER — ONDANSETRON HCL 4 MG/2ML IJ SOLN
4.0000 mg | Freq: Four times a day (QID) | INTRAMUSCULAR | Status: DC | PRN
  Administered 2014-11-07 – 2014-11-13 (×5): 4 mg via INTRAVENOUS
  Filled 2014-10-29 (×9): qty 2

## 2014-10-29 MED ORDER — ASPIRIN 81 MG PO CHEW: 81.0000 mg | CHEWABLE_TABLET | ORAL | Status: DC

## 2014-10-29 MED ORDER — HEPARIN (PORCINE) IN NACL 2-0.9 UNIT/ML-% IJ SOLN
INTRAMUSCULAR | Status: AC
  Filled 2014-10-29: qty 500

## 2014-10-29 MED ORDER — ACETAMINOPHEN 325 MG PO TABS
650.0000 mg | ORAL_TABLET | ORAL | Status: DC | PRN
  Administered 2014-10-29 – 2014-11-09 (×4): 650 mg via ORAL
  Filled 2014-10-29 (×3): qty 2

## 2014-10-29 MED ORDER — EZETIMIBE 10 MG PO TABS
10.0000 mg | ORAL_TABLET | ORAL | Status: DC
  Administered 2014-10-29 – 2014-11-12 (×6): 10 mg via ORAL
  Filled 2014-10-29 (×9): qty 1

## 2014-10-29 MED ORDER — ZOLPIDEM TARTRATE 5 MG PO TABS: 5.0000 mg | ORAL_TABLET | Freq: Every evening | ORAL | Status: DC | PRN

## 2014-10-29 MED ORDER — POTASSIUM CHLORIDE CRYS ER 20 MEQ PO TBCR
20.0000 meq | EXTENDED_RELEASE_TABLET | Freq: Two times a day (BID) | ORAL | Status: DC
  Administered 2014-10-29 – 2014-10-30 (×4): 20 meq via ORAL
  Filled 2014-10-29 (×4): qty 1

## 2014-10-29 MED ORDER — SODIUM CHLORIDE 0.9 % IV SOLN
INTRAVENOUS | Status: DC | PRN
  Administered 2014-10-29: 10 mL/h via INTRAVENOUS

## 2014-10-29 MED ORDER — LIDOCAINE HCL (PF) 1 % IJ SOLN
INTRAMUSCULAR | Status: AC
  Filled 2014-10-29: qty 30

## 2014-10-29 MED ORDER — ALBUTEROL SULFATE (2.5 MG/3ML) 0.083% IN NEBU: 2.5000 mg | INHALATION_SOLUTION | Freq: Four times a day (QID) | RESPIRATORY_TRACT | Status: DC | PRN

## 2014-10-29 SURGICAL SUPPLY — 10 items
CATH SWAN GANZ 7F STRAIGHT (CATHETERS) ×1 IMPLANT
COVER PRB 48X5XTLSCP FOLD TPE (BAG) IMPLANT
COVER PROBE 5X48 (BAG) ×2
KIT HEART LEFT (KITS) ×2 IMPLANT
PACK CARDIAC CATHETERIZATION (CUSTOM PROCEDURE TRAY) ×2 IMPLANT
SHEATH PINNACLE 7F 10CM (SHEATH) ×1 IMPLANT
SLEEVE REPOSITIONING LENGTH 30 (MISCELLANEOUS) ×1 IMPLANT
TRANSDUCER W/STOPCOCK (MISCELLANEOUS) ×3 IMPLANT
TUBING ART PRESS 72  MALE/FEM (TUBING) ×1
TUBING ART PRESS 72 MALE/FEM (TUBING) IMPLANT

## 2014-10-29 NOTE — H&P (Signed)
Advanced Heart Failure Team History and Physical Note   PCP: Garret Reddish, MD Electrophysiologist: Caryl Comes CHF: [name]   Reason for Admission:  Acute on chronic systolic HF   HPI:    Sandra [name] is a 81 y.o. female with h/o PAF, hypothyroidism, CKD stage 4 and chronic systolic HF due to NICM with EF 25% seen today for Dr Caryl Comes. S  Over past few months she has had worsening HF symptoms and now Class IIIB/IV. She has been on Tikosyn for AF with suboptimal control (stil with 20% AF). However in September Tikosyn was stopped due to worsening renal function. Currently unable to do any activities without dyspnea and fatigue. Weight labile. Brought to cath lab today for RHC.   RHC showed:  1. Severe biventricular HF with elevated filling pressures and markedly depressed cardiac output c/w shock physiology 2. Moderate mixed pulmonary HTN   Findings:  RA = 19 RV = 70/9/19 PA = 69/31 (45) PCW = 27 Fick cardiac output/index = 2.3/1.4 Thermo cardiac output/index = 2.3/1.4 PVR = 7.8 WU Ao sat = 94% PA sat = 35%, 38%   Device History: STJ CRTD implanted 2007 for HCM and systolic heart failure. She had pocket hematoma evacuation the day after implant; deep suture abscess with wound exploration month after implant; wound exploration 3 months after implant; generator change 2013 History of appropriate therapy: No History of AAD therapy: Yes - on Tikosyn for atrial fibrillation  Review of Systems: [y] = yes, [ ]  = no   General: Weight gain [ ] ; Weight loss [ ] ; Anorexia [ ] ; Fatigue Blue.Reese ]; Fever [ ] ; Chills [ ] ; Weakness [ ]   Cardiac: Chest pain/pressure [ ] ; Resting SOB [ ] ; Exertional SOB Blue.Reese ]; Orthopnea [ ] ; Pedal Edema [ y]; Palpitations [ ] ; Syncope [ ] ; Presyncope [ ] ; Paroxysmal nocturnal dyspnea[ ]   Pulmonary: Cough [ ] ; Wheezing[ ] ; Hemoptysis[ ] ; Sputum [ ] ; Snoring [ ]   GI: Vomiting[ ] ; Dysphagia[ ] ; Melena[ ] ; Hematochezia [ ] ; Heartburn[ ] ; Abdominal pain [ ] ;  Constipation [ ] ; Diarrhea [ ] ; BRBPR [ ]   GU: Hematuria[ ] ; Dysuria [ ] ; Nocturia[ ]   Vascular: Pain in legs with walking [ ] ; Pain in feet with lying flat [ ] ; Non-healing sores [ ] ; Stroke [ ] ; TIA [ ] ; Slurred speech [ ] ;  Neuro: Headaches[ ] ; Vertigo[ ] ; Seizures[ ] ; Paresthesias[ ] ;Blurred vision [ ] ; Diplopia [ ] ; Vision changes [ ]   Ortho/Skin: Arthritis Blue.Reese ]; Joint pain [ y]; Muscle pain [ ] ; Joint swelling [ ] ; Back Pain [ ] ; Rash [ ]   Psych: Depression[ ] ; Anxiety[ ]   Heme: Bleeding problems [ ] ; Clotting disorders [ ] ; Anemia [ ]   Endocrine: Diabetes [ ] ; Thyroid dysfunction[y ]  Home Medications Prior to Admission medications   Medication Sig Start Date End Date Taking? Authorizing Provider  b complex vitamins tablet Take 1 tablet by mouth every Monday, Wednesday, and Friday. Take one tablet by mouth three times a week   Yes Historical Provider, MD  benazepril (LOTENSIN) 10 MG tablet TAKE ONE TABLET BY MOUTH TWICE DAILY 04/02/14  Yes Marin Olp, MD  carvedilol (COREG) 12.5 MG tablet Take 1 tablet (12.5 mg total) by mouth 2 (two) times daily with a meal. 04/18/14  Yes Deboraha Sprang, MD  ezetimibe (ZETIA) 10 MG tablet Take 10 mg by mouth every Monday, Wednesday, and Friday.    Yes Historical Provider, MD  fluticasone (FLONASE) 50 MCG/ACT nasal spray  Place 2 sprays into both nostrils daily. 09/27/14  Yes Marin Olp, MD  furosemide (LASIX) 40 MG tablet Take 20-40 mg by mouth 2 (two) times daily. 20 mg in the morning and 40 mg in the evening   Yes   SYNTHROID 100 MCG tablet TAKE ONE TABLET BY MOUTH ONCE DAILY BEFORE  BREAKFAST Patient taking differently: TAKE ONE TABLET BY MOUTH ONCE DAILY AT BEDTIME 08/04/14  Yes Marin Olp, MD  albuterol (PROVENTIL HFA;VENTOLIN HFA) 108 (90 BASE) MCG/ACT inhaler Inhale 2 puffs into the lungs every 6 (six) hours as needed for wheezing or shortness of breath. 10/02/13   Marin Olp, MD  clorazepate (TRANXENE) 7.5 MG tablet Take 7.5 mg  by mouth 2 (two) times daily as needed for anxiety (for anxiety).    Historical Provider, MD  vitamin E 400 UNIT capsule Take 400 Units by mouth daily.     Historical Provider, MD  warfarin (COUMADIN) 5 MG tablet TAKE AS DIRECTED Patient taking differently: Take 5 mg by mouth on Monday and Friday. Take 2.5 mg by mouth daily on all other days. 02/05/14   Kennyth Arnold, FNP    Past Medical History: Past Medical History  Diagnosis Date  . Atrial fibrillation (HCC)     on Tikosyn and Coumadin  . Hyperlipidemia   . Hypothyroidism   . Bradycardia   . Cardiomyopathy, hypertrophic nonobstructive (HCC)     ejection fraction 25%  . Anal fissure   . Adenomatous colon polyp   . Diverticulosis   . ICD-CRT     generator change 2013  . Systolic heart failure   . History of colonoscopy   . Migraines   . Hyperlipidemia   . Hypertension   . Cardiac arrhythmia due to congenital heart disease     Past Surgical History: Past Surgical History  Procedure Laterality Date  . Cardioversion    . Cardiac defibrillator placement  2007  . Vaginal hysterectomy      uterus alone  . Ovarian cyst removal  1989    benign  . Biv icd genertaor change out N/A 03/23/2011    Procedure: BIV ICD GENERTAOR CHANGE OUT;  Surgeon: Deboraha Sprang, MD;  Location: [facility] CATH LAB;  Service: Cardiovascular;  Laterality: N/A;  . Colonoscopy      Family History: Family History  Problem Relation Age of Onset  . Heart disease Mother   . Colon cancer Father   . COPD Father   . Heart disease Father   . Cardiomyopathy Daughter   . Breast cancer Paternal Aunt   . Colon polyps Father   . Diabetes Neg Hx   . Kidney disease Neg Hx     Social History: Social History   Social History  . Marital Status: Married    Spouse Name: N/A  . Number of Children: 3  . Years of Education: N/A   Occupational History  . Retired     Probation officer   Social History Main Topics  . Smoking status: Former Smoker -- 0.20 packs/day  for 19 years    Types: Cigarettes    Quit date: 01/13/1987  . Smokeless tobacco: Never Used  . Alcohol Use: No  . Drug Use: No  . Sexual Activity: Not Currently   Other Topics Concern  . Not on file   Social History Narrative   Married 1953 (husband Clare Gandy in our Network [employer]). 3 girls (1 is a patient here-[name]). 2 grandkids.       Retired  in [place]: very active, going to church and church friends, movies    Allergies:  Allergies  Allergen Reactions  . Levofloxacin Other (See Comments)    Due to cardiac  AF  . Procaine Hcl Other (See Comments)    REACTION: smothers  . Spironolactone Diarrhea    Abdominal pain  . Amiodarone Nausea And Vomiting  . Amoxicillin Other (See Comments)    Rash   . Codeine Other (See Comments)    REACTION: nausea  . Niacin Other (See Comments)    REACTION: rash  . Ramipril Other (See Comments)    unknown  . Statins Other (See Comments)    REACTION: muscle aches    Objective:    Vital Signs:   Temp:  [97.9 F (36.6 C)] 97.9 F (36.6 C) (10/17 0708) Pulse Rate:  [67-109] 108 (10/17 0953) Resp:  [16-30] 17 (10/17 0953) BP: (119-139)/(77-97) 139/95 mmHg (10/17 0953) SpO2:  [92 %-97 %] 97 % (10/17 0953) Weight:  [68.493 kg (151 lb)] 68.493 kg (151 lb) (10/17 0708)   Filed Weights   10/29/14 0708  Weight: 68.493 kg (151 lb)    Physical Exam:  GEN- The patient is elderly appearing, alert and oriented x 3 today.  HEENT: normocephalic, atraumatic; sclera clear, conjunctiva pink; hearing intact; oropharynx clear; neck supple Lungs- Clear to ausculation bilaterally, normal work of breathing. No wheezes, rales, rhonchi Heart- PMI laterally displaced. Mildly irregular GI- soft, non-tender, non-distended, bowel sounds present  Extremities- no clubbing, cyanosis, 1+ edema; DP/PT/radial pulses 1+ bilaterally MS- no significant deformity or atrophy Skin- warm and dry, no rash or lesion; ICD pocket well  healed Psych- euthymic mood, full affect Neuro- strength and sensation are intact  Telemetry: v-paced (underlying AF)  Labs: Basic Metabolic Panel:  Recent Labs Lab 10/22/14 1212 10/23/14 1903 10/29/14 0800  NA 139 141 138  K 3.6 4.0 3.6  CL 100 104 101  CO2 28 27 25   GLUCOSE 81 103* 98  BUN 28* 32* 38*  CREATININE 1.73* 1.94* 1.84*  CALCIUM 9.8 9.5 9.1    Liver Function Tests: No results for input(s): AST, ALT, ALKPHOS, BILITOT, PROT, ALBUMIN in the last 168 hours. No results for input(s): LIPASE, AMYLASE in the last 168 hours. No results for input(s): AMMONIA in the last 168 hours.  CBC:  Recent Labs Lab 10/22/14 1212 10/23/14 1903 10/29/14 0800  WBC 7.1 7.1 6.3  HGB 13.4 12.7 12.2  HCT 40.4 40.9 38.8  MCV 85.6 89.9 89.4  PLT 220 222 176    Cardiac Enzymes: No results for input(s): CKTOTAL, CKMB, CKMBINDEX, TROPONINI in the last 168 hours.  BNP: BNP (last 3 results) No results for input(s): BNP in the last 8760 hours.  ProBNP (last 3 results) No results for input(s): PROBNP in the last 8760 hours.   CBG: No results for input(s): GLUCAP in the last 168 hours.  Coagulation Studies:  Recent Labs  10/29/14 0800  LABPROT 25.8*  INR 2.39*    Other results: EKG: pending  Imaging:  No results found.      Assessment:   1. Acute on chronic systolic HF due to NICM EF 25% 2. Cardiogenic shock 3. PAF 4. CKD, stage 4 5. Hypothyroidism   Plan/Discussion:    Will leave Swan in and admit to ICU for milrinone initiation and diuresis. Worsening of symptoms somewhat correlate to higher burden of AF off Tikosyn, Discussed with EP. If creatinine improved with inotropic support can  consider re-initiation of Tikosyn versus AV Node ablation (which she has been hesitant about).    Length of Stay: 0  Glori Bickers MD 10/29/2014, 10:18 AM  Advanced Heart Failure Team Pager [phone] (M-F; [place])  Please contact [facility] for  night-coverage after hours (4p -7a ) and weekends on [email or web address]

## 2014-10-29 NOTE — Care Management Note (Signed)
Case Management Note  Patient Details  Name: Sandra [name] MRN: [id number] Date of Birth: 01-24-1932  Subjective/Objective:    Adm w heart failure                Action/Plan: lives w fam   Expected Discharge Date:                  Expected Discharge Plan:     In-House Referral:     Discharge planning Services     Post Acute Care Choice:    Choice offered to:     DME Arranged:    DME Agency:     HH Arranged:    [place] Agency:     Status of Service:     Medicare Important Message Given:    Date Medicare IM Given:    Medicare IM give by:    Date Additional Medicare IM Given:    Additional Medicare Important Message give by:     If discussed at [place] of Stay Meetings, dates discussed:    Additional Comments:ur review done  Lacretia Leigh, RN 10/29/2014, 10:32 AM
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
## 2014-10-29 NOTE — Interval H&P Note (Signed)
History and Physical Interval Note:  10/29/2014 9:21 AM  Sandra [name]  has presented today for surgery, with the diagnosis of chf  The various methods of treatment have been discussed with the patient and family. After consideration of risks, benefits and other options for treatment, the patient has consented to  Procedure(s): Right Heart Cath (N/A) as a surgical intervention .  The patient's history has been reviewed, patient examined, no change in status, stable for surgery.  I have reviewed the patient's chart and labs.  Questions were answered to the patient's satisfaction.     Bensimhon, Quillian Quince

## 2014-10-29 NOTE — Progress Notes (Signed)
ANTICOAGULATION CONSULT NOTE - Initial Consult  Pharmacy Consult for Warfarin  Indication: atrial fibrillation  Allergies  Allergen Reactions  . Levofloxacin Other (See Comments)    Due to cardiac  AF  . Procaine Hcl Other (See Comments)    REACTION: smothers  . Spironolactone Diarrhea    Abdominal pain  . Amiodarone Nausea And Vomiting  . Amoxicillin Other (See Comments)    Rash   . Codeine Other (See Comments)    REACTION: nausea  . Niacin Other (See Comments)    REACTION: rash  . Ramipril Other (See Comments)    unknown  . Statins Other (See Comments)    REACTION: muscle aches    Patient Measurements: Height: 5\' 3"  (160 cm) Weight: 151 lb 7.3 oz (68.7 kg) IBW/kg (Calculated) : 52.4  Vital Signs: Temp: 98.6 F (37 C) (10/17 1145) Temp Source: Oral (10/17 0708) BP: 136/90 mmHg (10/17 1145) Pulse Rate: 74 (10/17 1145)  Labs:  Recent Labs  10/29/14 0800  HGB 12.2  HCT 38.8  PLT 176  LABPROT 25.8*  INR 2.39*  CREATININE 1.84*    Estimated Creatinine Clearance: 22.7 mL/min (by C-G formula based on Cr of 1.84).   Medical History: Past Medical History  Diagnosis Date  . Atrial fibrillation (HCC)     on Tikosyn and Coumadin  . Hyperlipidemia   . Hypothyroidism   . Bradycardia   . Cardiomyopathy, hypertrophic nonobstructive (HCC)     ejection fraction 25%  . Anal fissure   . Adenomatous colon polyp   . Diverticulosis   . ICD-CRT     generator change 2013  . Systolic heart failure   . History of colonoscopy   . Migraines   . Hyperlipidemia   . Hypertension   . Cardiac arrhythmia due to congenital heart disease       Assessment: 82yof admitted post RHC for milrinone and iv furosemide in setting of volume overload and low cardiac output.  She has Hx Afib with RVR 100s and is on warfarin PTA INR on admit 2.39, CBC stable, no bleeding  PTA Warfarin 5mg  MF, 2.5mg  AOD  Goal of Therapy:  INR 2-3 Monitor platelets by anticoagulation protocol:  Yes   Plan:  Warfarin 5mg  MF, 2.5mg  AOD - continue home dose Daily Protime  Bonnita Nasuti Pharm.D. CPP, BCPS Clinical Pharmacist [phone] 10/29/2014 1:27 PM

## 2014-10-29 NOTE — Progress Notes (Signed)
Patient's milrinone was increased to .375 mcg/kg/min at 1910. Patient started having increasing frequency of PVCs. MD paged. New order for milrinone to be decreased to prior dose of .25 mcg/kg/min and stat BMP and Mag labs. Patient is asymptomatic. Will continue to monitor.

## 2014-10-30 ENCOUNTER — Encounter (HOSPITAL_COMMUNITY): Payer: Self-pay | Admitting: *Deleted

## 2014-10-30 DIAGNOSIS — I480 Paroxysmal atrial fibrillation: Secondary | ICD-10-CM

## 2014-10-30 DIAGNOSIS — I130 Hypertensive heart and chronic kidney disease with heart failure and stage 1 through stage 4 chronic kidney disease, or unspecified chronic kidney disease: Secondary | ICD-10-CM | POA: Diagnosis not present

## 2014-10-30 LAB — BASIC METABOLIC PANEL
ANION GAP: 10 (ref 5–15)
BUN: 34 mg/dL — ABNORMAL HIGH (ref 6–20)
CHLORIDE: 100 mmol/L — AB (ref 101–111)
CO2: 27 mmol/L (ref 22–32)
Calcium: 8.6 mg/dL — ABNORMAL LOW (ref 8.9–10.3)
Creatinine, Ser: 1.85 mg/dL — ABNORMAL HIGH (ref 0.44–1.00)
GFR, EST AFRICAN AMERICAN: 29 mL/min — AB (ref >60)
GFR, EST NON AFRICAN AMERICAN: 25 mL/min — AB (ref >60)
Glucose, Bld: 88 mg/dL (ref 65–99)
POTASSIUM: 3.8 mmol/L (ref 3.5–5.1)
SODIUM: 137 mmol/L (ref 135–145)

## 2014-10-30 LAB — URINALYSIS, ROUTINE W REFLEX MICROSCOPIC
BILIRUBIN URINE: NEGATIVE
Glucose, UA: NEGATIVE mg/dL
KETONES UR: NEGATIVE mg/dL
Nitrite: POSITIVE — AB
PH: 5 (ref 5.0–8.0)
PROTEIN: 30 mg/dL — AB
Specific Gravity, Urine: 1.012 (ref 1.005–1.030)
UROBILINOGEN UA: 1 mg/dL (ref 0.0–1.0)

## 2014-10-30 LAB — CARBOXYHEMOGLOBIN
Carboxyhemoglobin: 1.2 % (ref 0.5–1.5)
METHEMOGLOBIN: 0.6 % (ref 0.0–1.5)
O2 SAT: 58.4 %
TOTAL HEMOGLOBIN: 11.6 g/dL — AB (ref 12.0–16.0)

## 2014-10-30 LAB — URINE MICROSCOPIC-ADD ON

## 2014-10-30 LAB — PROTIME-INR
INR: 2.24 — ABNORMAL HIGH (ref 0.00–1.49)
PROTHROMBIN TIME: 24.6 s — AB (ref 11.6–15.2)

## 2014-10-30 LAB — T4, FREE: FREE T4: 1.71 ng/dL — AB (ref 0.61–1.12)

## 2014-10-30 LAB — TSH: TSH: 1.552 u[IU]/mL (ref 0.350–4.500)

## 2014-10-30 MED ORDER — AMIODARONE LOAD VIA INFUSION
150.0000 mg | Freq: Once | INTRAVENOUS | Status: AC
  Administered 2014-10-30: 150 mg via INTRAVENOUS
  Filled 2014-10-30: qty 83.34

## 2014-10-30 MED ORDER — AMIODARONE HCL IN DEXTROSE 360-4.14 MG/200ML-% IV SOLN
30.0000 mg/h | INTRAVENOUS | Status: DC
Start: 2014-10-30 — End: 2014-10-31
  Administered 2014-10-30: 30 mg/h via INTRAVENOUS
  Filled 2014-10-30: qty 200

## 2014-10-30 MED ORDER — POTASSIUM CHLORIDE CRYS ER 20 MEQ PO TBCR
20.0000 meq | EXTENDED_RELEASE_TABLET | Freq: Once | ORAL | Status: AC
  Administered 2014-10-30: 20 meq via ORAL
  Filled 2014-10-30: qty 1

## 2014-10-30 MED ORDER — POLYETHYLENE GLYCOL 3350 17 G PO PACK
17.0000 g | PACK | Freq: Every day | ORAL | Status: DC
  Administered 2014-10-30 – 2014-11-01 (×2): 17 g via ORAL
  Administered 2014-11-01: 8.5 g via ORAL
  Administered 2014-11-02 – 2014-11-09 (×7): 17 g via ORAL
  Filled 2014-10-30 (×14): qty 1

## 2014-10-30 MED ORDER — FENTANYL CITRATE (PF) 100 MCG/2ML IJ SOLN
25.0000 ug | INTRAMUSCULAR | Status: DC | PRN
  Administered 2014-10-30 (×2): 50 ug via INTRAVENOUS
  Administered 2014-10-30 (×2): 25 ug via INTRAVENOUS
  Administered 2014-10-31 (×4): 50 ug via INTRAVENOUS
  Administered 2014-11-08: 25 ug via INTRAVENOUS
  Administered 2014-11-08: 50 ug via INTRAVENOUS
  Filled 2014-10-30 (×11): qty 2

## 2014-10-30 MED ORDER — AMIODARONE HCL IN DEXTROSE 360-4.14 MG/200ML-% IV SOLN
60.0000 mg/h | INTRAVENOUS | Status: AC
  Administered 2014-10-30 (×2): 60 mg/h via INTRAVENOUS
  Filled 2014-10-30 (×2): qty 200

## 2014-10-30 NOTE — Progress Notes (Signed)
Unable to wedge swan-ganz.

## 2014-10-30 NOTE — Progress Notes (Signed)
[facility] hospital team is following with HF team for possible home care needs and possible home inotrope support if ordered.  [facility] will be available if needed to support transition to home upon DC.   If patient discharges after hours, please call [phone].   Larry Sierras 10/30/2014, 11:01 PM

## 2014-10-30 NOTE — Progress Notes (Signed)
ANTICOAGULATION CONSULT NOTE - Follow Up Consult  Pharmacy Consult for Warfarin  Indication: atrial fibrillation  Allergies  Allergen Reactions  . Levofloxacin Other (See Comments)    Due to cardiac  AF  . Procaine Hcl Other (See Comments)    REACTION: smothers  . Spironolactone Diarrhea    Abdominal pain  . Amiodarone Nausea And Vomiting  . Amoxicillin Other (See Comments)    Rash   . Codeine Other (See Comments)    REACTION: nausea  . Niacin Other (See Comments)    REACTION: rash  . Ramipril Other (See Comments)    unknown  . Statins Other (See Comments)    REACTION: muscle aches    Patient Measurements: Height: 5\' 3"  (160 cm) Weight: 149 lb 14.4 oz (67.994 kg) IBW/kg (Calculated) : 52.4  Vital Signs: Temp: 98.6 F (37 C) (10/18 1200) Temp Source: Core (Comment) (10/18 1100) BP: 148/99 mmHg (10/18 1200) Pulse Rate: 91 (10/18 1200)  Labs:  Recent Labs  10/29/14 0800 10/29/14 2211 10/30/14 0559  HGB 12.2  --   --   HCT 38.8  --   --   PLT 176  --   --   LABPROT 25.8*  --  24.6*  INR 2.39*  --  2.24*  CREATININE 1.84* 1.83* 1.85*    Estimated Creatinine Clearance: 22.4 mL/min (by C-G formula based on Cr of 1.85).   Medical History: Past Medical History  Diagnosis Date  . Atrial fibrillation (HCC)     on Tikosyn and Coumadin  . Hyperlipidemia   . Hypothyroidism   . Bradycardia   . Cardiomyopathy, hypertrophic nonobstructive (HCC)     ejection fraction 25%  . Anal fissure   . Adenomatous colon polyp   . Diverticulosis   . ICD-CRT     generator change 2013  . Systolic heart failure   . History of colonoscopy   . Migraines   . Hyperlipidemia   . Hypertension   . Cardiac arrhythmia due to congenital heart disease       Assessment: 82yof admitted post RHC for milrinone and IV furosemide in setting of volume overload and low cardiac output with hx AFib on warfarin PTA. INR on admit 2.39.  Today, INR 2.24 therapeutic. CBC stable, no bleeding  noted.   PTA Warfarin 5mg  MF, 2.5mg  all other days  Goal of Therapy:  INR 2-3 Monitor platelets by anticoagulation protocol: Yes   Plan:  Warfarin 2.5mg  tonight Daily INR Monitor s/sx bleeding    Heloise Ochoa, Pharm.D. PGY2 Cardiology Pharmacy Resident Pager: [phone] 10/30/2014 12:56 PM

## 2014-10-30 NOTE — Progress Notes (Signed)
Advanced Heart Failure Rounding Note   Subjective:    Admitted from cath lab. Cardiogenic shock. Started on milrinone 0.25 mcg  and diuresed with IV lasix. Milrinone was increased to 0.375 mcg but had increased PVCs so milrinone was cut back to 0.25 mcg. Diuresed 1.1 liters. Weight down 2 pounds.  Todays CO-OX is 58% on milrinone 0.25 mcg.  (up from 35%)   Swan numbers performed personally at bedside. CVP 12 PAP 56/35 (44) Wedge 20 CO 2.71 CI 1.58 SVR 2508 PVR 649  Studies:  RHC 10/17  RA = 19 RV = 70/9/19 PA = 69/31 (45) PCW = 27 Fick cardiac output/index = 2.3/1.4 Thermo cardiac output/index = 2.3/1.4 PVR = 7.8 WU Ao sat = 94% PA sat = 35%, 38%  Objective:   Weight Range:  Vital Signs:   Temp:  [98.1 F (36.7 C)-99.9 F (37.7 C)] 98.1 F (36.7 C) (10/18 0614) Pulse Rate:  [67-109] 94 (10/18 0614) Resp:  [15-30] 26 (10/18 0614) BP: (94-145)/(53-103) 118/73 mmHg (10/18 0500) SpO2:  [90 %-100 %] 97 % (10/18 0614) Weight:  [149 lb 14.4 oz (67.994 kg)-151 lb 7.3 oz (68.7 kg)] 149 lb 14.4 oz (67.994 kg) (10/18 0500)    Weight change: Filed Weights   10/29/14 1045 10/29/14 1140 10/30/14 0500  Weight: 151 lb 7.3 oz (68.7 kg) 151 lb 7.3 oz (68.7 kg) 149 lb 14.4 oz (67.994 kg)    Intake/Output:   Intake/Output Summary (Last 24 hours) at 10/30/14 0733 Last data filed at 10/30/14 0600  Gross per 24 hour  Intake 1045.62 ml  Output   2170 ml  Net -1124.38 ml     Physical Exam: GEN- The patient is elderly appearing, alert and oriented x 3 today.  HEENT: normal Neck. RIJ swan. supple Lungs- Clear to ausculation bilaterally, normal work of breathing. No wheezes, rales, rhonchi Heart- PMI laterally displaced. Mildly irregular GI- soft, non-tender, non-distended, bowel sounds present  Extremities- no clubbing, cyanosis, trace edema; DP/PT/radial pulses 1+ bilaterally MS- no significant deformity or atrophy Skin- warm and dry, no rash or lesion; ICD pocket  well healed Psych- euthymic mood, full affect Neuro- strength and sensation are intact  Telemetry:  Afib 90s   Labs: Basic Metabolic Panel:  Recent Labs Lab 10/23/14 1903 10/29/14 0800 10/29/14 2211 10/30/14 0559  NA 141 138 132* 137  K 4.0 3.6 4.5 3.8  CL 104 101 97* 100*  CO2 27 25 27 27   GLUCOSE 103* 98 133* 88  BUN 32* 38* 36* 34*  CREATININE 1.94* 1.84* 1.83* 1.85*  CALCIUM 9.5 9.1 8.3* 8.6*  MG  --   --  2.3  --     Liver Function Tests: No results for input(s): AST, ALT, ALKPHOS, BILITOT, PROT, ALBUMIN in the last 168 hours. No results for input(s): LIPASE, AMYLASE in the last 168 hours. No results for input(s): AMMONIA in the last 168 hours.  CBC:  Recent Labs Lab 10/23/14 1903 10/29/14 0800  WBC 7.1 6.3  HGB 12.7 12.2  HCT 40.9 38.8  MCV 89.9 89.4  PLT 222 176    Cardiac Enzymes: No results for input(s): CKTOTAL, CKMB, CKMBINDEX, TROPONINI in the last 168 hours.  BNP: BNP (last 3 results) No results for input(s): BNP in the last 8760 hours.  ProBNP (last 3 results) No results for input(s): PROBNP in the last 8760 hours.    Other results:  Imaging:  No results found.   Medications:     Scheduled Medications: . ezetimibe  10 mg Oral Q M,W,F  . fluticasone  2 spray Each Nare Daily  . furosemide  40 mg Intravenous BID  . Influenza vac split quadrivalent PF  0.5 mL Intramuscular Tomorrow-1000  . levothyroxine  100 mcg Oral Q breakfast  . potassium chloride  20 mEq Oral BID  . sodium chloride  3 mL Intravenous Q12H  . sodium chloride  3 mL Intravenous Q12H  . sodium chloride  3 mL Intravenous Q12H  . warfarin  2.5 mg Oral Once per day on Sun Tue Wed Thu Sat  . warfarin  5 mg Oral Once per day on Mon Fri  . Warfarin - Pharmacist Dosing Inpatient   Does not apply q1800     Infusions: . milrinone 0.25 mcg/kg/min (10/29/14 2343)     PRN Medications:  sodium chloride, sodium chloride, sodium chloride, acetaminophen, acetaminophen,  albuterol, ondansetron (ZOFRAN) IV, ondansetron (ZOFRAN) IV, sodium chloride, sodium chloride, sodium chloride, zolpidem   Assessment:   1. Acute on chronic systolic HF due to NICM EF 25% 2. Cardiogenic shock 3. PAF   - Tikosyn topped recently due to worsening CKD   - unable to tolerate amio due to n/v 4. CKD, stage 4 5. Hypothyroidism  Plan/Discussion:     In the past was on tikosyn but later stopped due to renal function. Intolerant amio. Maintaining A fib 90s. INR 2.2 Pharmacy dosing coumadin. EP consulted.   Length of Stay: 1 Amy Clegg NP-C 10/30/2014, 7:33 AM Advanced Heart Failure Team Pager [phone] (M-F; 7a - 4p)  Please contact [facility] for night-coverage after hours (4p -7a ) and weekends on [email or web address]   Patient seen and examined with Darrick Grinder, NP. We discussed all aspects of the encounter. I agree with the assessment and plan as stated above.   Cardiac somewhat improved with milrinone but output remains quite low. Unable to tolerate higher dose of milrinone due to increased ectopy. Long talk about how advanced her HF and how limited her options are at age 81. Also acknowledged that recurrent AF may be playing a role in deterioration though options for that are limited as well.   Plan for now will be to try to reinitiate amiodarone in the setting of milrinone support and see how she tolerates. If tolerates will plan DC-CV later in week. Keep Swan in one more day. Likely switch to PICC tomorrow.   Discussed prognosis may be as bad as 50% mortality at 6 months.   The patient is critically ill with multiple organ systems failure and requires high complexity decision making for assessment and support, frequent evaluation and titration of therapies, application of advanced monitoring technologies and extensive interpretation of multiple databases.   Critical Care Time devoted to patient care services described in this note is 35 Minutes.  Malan Werk,MD 12:07  PM

## 2014-10-31 DIAGNOSIS — I481 Persistent atrial fibrillation: Secondary | ICD-10-CM

## 2014-10-31 DIAGNOSIS — I5023 Acute on chronic systolic (congestive) heart failure: Secondary | ICD-10-CM

## 2014-10-31 LAB — CARBOXYHEMOGLOBIN
Carboxyhemoglobin: 1.2 % (ref 0.5–1.5)
Carboxyhemoglobin: 1.2 % (ref 0.5–1.5)
Carboxyhemoglobin: 1.1 % (ref 0.5–1.5)
METHEMOGLOBIN: 0.7 % (ref 0.0–1.5)
METHEMOGLOBIN: 0.8 % (ref 0.0–1.5)
Methemoglobin: 0.8 % (ref 0.0–1.5)
O2 SAT: 51.2 %
O2 SAT: 62.6 %
O2 Saturation: 58.7 %
TOTAL HEMOGLOBIN: 12.3 g/dL (ref 12.0–16.0)
TOTAL HEMOGLOBIN: 12.5 g/dL (ref 12.0–16.0)
TOTAL HEMOGLOBIN: 10.6 g/dL — AB (ref 12.0–16.0)

## 2014-10-31 LAB — CBC
HCT: 39.4 % (ref 36.0–46.0)
HEMOGLOBIN: 12 g/dL (ref 12.0–15.0)
MCH: 27.6 pg (ref 26.0–34.0)
MCHC: 30.5 g/dL (ref 30.0–36.0)
MCV: 90.8 fL (ref 78.0–100.0)
Platelets: 185 10*3/uL (ref 150–400)
RBC: 4.34 MIL/uL (ref 3.87–5.11)
RDW: 16.3 % — AB (ref 11.5–15.5)
WBC: 7.4 10*3/uL (ref 4.0–10.5)

## 2014-10-31 LAB — BASIC METABOLIC PANEL
ANION GAP: 8 (ref 5–15)
BUN: 26 mg/dL — AB (ref 6–20)
CALCIUM: 8.8 mg/dL — AB (ref 8.9–10.3)
CO2: 27 mmol/L (ref 22–32)
Chloride: 100 mmol/L — ABNORMAL LOW (ref 101–111)
Creatinine, Ser: 1.71 mg/dL — ABNORMAL HIGH (ref 0.44–1.00)
GFR calc Af Amer: 31 mL/min — ABNORMAL LOW (ref >60)
GFR, EST NON AFRICAN AMERICAN: 27 mL/min — AB (ref >60)
Glucose, Bld: 119 mg/dL — ABNORMAL HIGH (ref 65–99)
POTASSIUM: 4.7 mmol/L (ref 3.5–5.1)
SODIUM: 135 mmol/L (ref 135–145)

## 2014-10-31 LAB — PROTIME-INR
INR: 2.66 — AB (ref 0.00–1.49)
PROTHROMBIN TIME: 28 s — AB (ref 11.6–15.2)

## 2014-10-31 MED ORDER — DOBUTAMINE IN D5W 4-5 MG/ML-% IV SOLN
3.0000 ug/kg/min | INTRAVENOUS | Status: DC
  Administered 2014-10-31: 5 ug/kg/min via INTRAVENOUS
  Administered 2014-11-03: 3 ug/kg/min via INTRAVENOUS
  Filled 2014-10-31: qty 250

## 2014-10-31 MED ORDER — FUROSEMIDE 10 MG/ML IJ SOLN
80.0000 mg | Freq: Three times a day (TID) | INTRAMUSCULAR | Status: DC
Start: 2014-10-31 — End: 2014-11-01
  Administered 2014-10-31 (×2): 80 mg via INTRAVENOUS
  Filled 2014-10-31 (×2): qty 8

## 2014-10-31 MED ORDER — DOBUTAMINE IN D5W 4-5 MG/ML-% IV SOLN
INTRAVENOUS | Status: AC
  Filled 2014-10-31: qty 250

## 2014-10-31 MED ORDER — HYDRALAZINE HCL 25 MG PO TABS: 12.5000 mg | ORAL_TABLET | Freq: Three times a day (TID) | ORAL | Status: DC

## 2014-10-31 MED ORDER — POTASSIUM CHLORIDE CRYS ER 20 MEQ PO TBCR
20.0000 meq | EXTENDED_RELEASE_TABLET | Freq: Every day | ORAL | Status: DC
  Administered 2014-11-01 – 2014-11-11 (×11): 20 meq via ORAL
  Filled 2014-10-31 (×12): qty 1

## 2014-10-31 MED ORDER — ONDANSETRON HCL 4 MG/2ML IJ SOLN
4.0000 mg | Freq: Once | INTRAMUSCULAR | Status: AC
  Administered 2014-10-31: 4 mg via INTRAVENOUS

## 2014-10-31 MED ORDER — MILRINONE IN DEXTROSE 20 MG/100ML IV SOLN: 0.3750 ug/kg/min | INTRAVENOUS | Status: DC

## 2014-10-31 MED ORDER — FUROSEMIDE 10 MG/ML IJ SOLN
80.0000 mg | Freq: Two times a day (BID) | INTRAMUSCULAR | Status: DC
  Administered 2014-10-31: 80 mg via INTRAVENOUS
  Filled 2014-10-31: qty 8

## 2014-10-31 MED ORDER — METOLAZONE 2.5 MG PO TABS
2.5000 mg | ORAL_TABLET | Freq: Once | ORAL | Status: AC
  Administered 2014-10-31: 2.5 mg via ORAL
  Filled 2014-10-31: qty 1

## 2014-10-31 MED ORDER — TRAMADOL HCL 50 MG PO TABS
50.0000 mg | ORAL_TABLET | Freq: Four times a day (QID) | ORAL | Status: DC | PRN
  Administered 2014-10-31 – 2014-11-10 (×7): 50 mg via ORAL
  Filled 2014-10-31 (×7): qty 1

## 2014-10-31 MED ORDER — ALPRAZOLAM 0.25 MG PO TABS
0.2500 mg | ORAL_TABLET | Freq: Two times a day (BID) | ORAL | Status: DC | PRN
  Administered 2014-10-31 – 2014-11-09 (×4): 0.25 mg via ORAL
  Filled 2014-10-31 (×4): qty 1

## 2014-10-31 MED FILL — Heparin Sodium (Porcine) 2 Unit/ML in Sodium Chloride 0.9%: INTRAMUSCULAR | Qty: 500 | Status: AC

## 2014-10-31 NOTE — Progress Notes (Signed)
SUBJECTIVE: The patient has a persistent headache and nausea today.   CURRENT MEDICATIONS: . ezetimibe  10 mg Oral Q M,W,F  . fluticasone  2 spray Each Nare Daily  . furosemide  80 mg Intravenous BID  . levothyroxine  100 mcg Oral Q breakfast  . metolazone  2.5 mg Oral Once  . polyethylene glycol  17 g Oral Daily  . [START ON 11/01/2014] potassium chloride  20 mEq Oral Daily  . warfarin  2.5 mg Oral Once per day on Sun Tue Wed Thu Sat  . warfarin  5 mg Oral Once per day on Mon Fri  . Warfarin - Pharmacist Dosing Inpatient   Does not apply q1800   . amiodarone Stopped (10/31/14 0810)  . milrinone      OBJECTIVE: Physical Exam: Filed Vitals:   10/31/14 0739 10/31/14 0800 10/31/14 0804 10/31/14 0909  BP:  155/95    Pulse:  81 155 82  Temp: 97.9 F (36.6 C) 98.1 F (36.7 C) 97.5 F (36.4 C) 98.4 F (36.9 C)  TempSrc: Oral Core (Comment)    Resp:  19 19 21   Height:      Weight:      SpO2:  99% 99% 99%    Intake/Output Summary (Last 24 hours) at 10/31/14 1856 Last data filed at 10/31/14 0800  Gross per 24 hour  Intake 1019.11 ml  Output   1470 ml  Net -450.89 ml    Telemetry reveals atrial fibrillation  GEN- The patient is elderly appearing, alert and oriented x 3 today.   Head- normocephalic, atraumatic Eyes-  Sclera clear, conjunctiva pink Ears- hearing intact Oropharynx- clear Neck- supple  Lungs- Clear to ausculation bilaterally, normal work of breathing Heart- Irregular rate and rhythm  GI- soft, NT, ND, + BS Extremities- no clubbing, cyanosis, 1+ edema Skin- no rash or lesion, ICD pocket well healed Psych- euthymic mood, full affect Neuro- strength and sensation are intact  LABS: Basic Metabolic Panel:  Recent Labs  10/29/14 2211 10/30/14 0559 10/31/14 0440  NA 132* 137 135  K 4.5 3.8 4.7  CL 97* 100* 100*  CO2 27 27 27   GLUCOSE 133* 88 119*  BUN 36* 34* 26*  CREATININE 1.83* 1.85* 1.71*  CALCIUM 8.3* 8.6* 8.8*  MG 2.3  --   --     CBC:  Recent Labs  10/29/14 0800 10/31/14 0440  WBC 6.3 7.4  HGB 12.2 12.0  HCT 38.8 39.4  MCV 89.4 90.8  PLT 176 185   Thyroid Function Tests:  Recent Labs  10/30/14 0800  TSH 1.552   RADIOLOGY: Dg Chest 2 View 10/23/2014  CLINICAL DATA:  Chest pressure, atrial fibrillation EXAM: CHEST  2 VIEW COMPARISON:  12/18/2012 FINDINGS: Cardiomegaly again noted. Three leads cardiac pacemaker is unchanged in position. No acute infiltrate or pleural effusion. No pulmonary edema. Osteopenia and degenerative changes thoracic spine. IMPRESSION: No active cardiopulmonary disease. Electronically Signed   By: Lahoma Crocker M.D.   On: 10/23/2014 19:22    ASSESSMENT AND PLAN:  Active Problems:   Acute on chronic systolic congestive heart failure (HCC)   Cardiogenic shock (HCC)  1.  Persistent atrial fibrillation The patient has persistent atrial fibrillation that is exacerbating heart failure. Was previously on Tikosyn with worsening renal function and so this was discontinued.  Started on po amiodarone as an outpatient but unable to tolerate 2/2 nausea.  RHC done Monday with shock physiology and started on IV amiodarone with recurrent nausea. Amiodarone now discontinued.  Unfortunately, AVN ablation is likely only option. Dr Caryl Comes is her EP and knows her well.  He has discussed AVN ablation in the past, but she has been reluctant to be device dependant. She understands that we are very limited in medical options at this point.  Continue Warfarin for CHADS2VASC of at least 5   2.  Acute on chronic systolic heart failure/cardiogenic shock Management per AHF team  Chanetta Marshall, NP 10/31/2014 9:34 AM  I have seen, examined the patient, and reviewed the above assessment and plan.  On exam iRRR. Changes to above are made where necessary.    Co Sign: Thompson Grayer, MD 10/31/2014 9:45 PM

## 2014-10-31 NOTE — Care Management Note (Signed)
Case Management Note  Patient Details  Name: PEARLIE NIES MRN: [id number] Date of Birth: [birth year]  Subjective/Objective:       Adm w heart failure             Action/Plan: lives at home w fam. Has several da's also. 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   Expected Discharge Date:                  Expected Discharge Plan:  Agency  In-House Referral:     Discharge planning Services  CM Consult  Post Acute Care Choice:  Durable Medical Equipment, Home Health Choice offered to:  Patient  DME Arranged:  IV pump/equipment DME Agency:  [place]:  RN, Disease Management [place] Agency:  [place]  Status of Service:     Medicare Important Message Given:    Date Medicare IM Given:    Medicare IM give by:    Date Additional Medicare IM Given:    Additional Medicare Important Message give by:     If discussed at [place] of Stay Meetings, dates discussed:    Additional Comments: may need home iv milrinone. md would like [facility] and pt has had fam that used [facility] and would like to use them for home milrinone if needed and hhrn. Spoke w donna w [facility] and they will plan to follow pt post disch.  Lacretia Leigh, RN 10/31/2014, 10:17 AM

## 2014-10-31 NOTE — Progress Notes (Signed)
ANTICOAGULATION CONSULT NOTE - Follow Up Consult  Pharmacy Consult for Coumadin Indication: atrial fibrillation  Allergies  Allergen Reactions  . Levofloxacin Other (See Comments)    Due to cardiac  AF  . Procaine Hcl Other (See Comments)    REACTION: smothers  . Spironolactone Diarrhea    Abdominal pain  . Amiodarone Nausea And Vomiting  . Amoxicillin Other (See Comments)    Rash   . Codeine Other (See Comments)    REACTION: nausea  . Niacin Other (See Comments)    REACTION: rash  . Ramipril Other (See Comments)    unknown  . Statins Other (See Comments)    REACTION: muscle aches    Patient Measurements: Height: 5\' 3"  (160 cm) Weight: 151 lb 14.4 oz (68.9 kg) IBW/kg (Calculated) : 52.4  Vital Signs: Temp: 98.4 F (36.9 C) (10/19 0909) Temp Source: Core (Comment) (10/19 0800) BP: 155/95 mmHg (10/19 0800) Pulse Rate: 82 (10/19 0909)  Labs:  Recent Labs  10/29/14 0800 10/29/14 2211 10/30/14 0559 10/31/14 0440  HGB 12.2  --   --  12.0  HCT 38.8  --   --  39.4  PLT 176  --   --  185  LABPROT 25.8*  --  24.6* 28.0*  INR 2.39*  --  2.24* 2.66*  CREATININE 1.84* 1.83* 1.85* 1.71*    Estimated Creatinine Clearance: 24.4 mL/min (by C-G formula based on Cr of 1.71).  Assessment: Sandra [name] continues on coumadin for afib. INR remains therapeutic at 2.66 on her home dose of 5mg  Mon/Fri and 2.5mg  all other days. She also continues on an amiodarone drip so her coumadin requirement may eventually decrease. CBC stable. No bleeding reported.  Goal of Therapy:  INR 2-3 Monitor platelets by anticoagulation protocol: Yes   Plan:  1) Continue coumadin 5mg  Mon/Fri, 2.5mg  all other days 2) Daily INR  Deboraha Sprang 10/31/2014,11:11 AM

## 2014-10-31 NOTE — Progress Notes (Signed)
  Volume status somewhat improved (PCWP 26) but remains very fatigued. + severe HA. No real change in CO with increased milrinone.   Will switch to dobutamine.  Options becoming diminishingly few.   Bensimhon, Daniel,MD 4:35 PM

## 2014-10-31 NOTE — Progress Notes (Addendum)
Advanced Heart Failure Rounding Note   Subjective:    Admitted from cath lab. Cardiogenic shock. Started on milrinone 0.25 mcg  and diuresed with IV lasix. Milrinone was increased to 0.375 mcg but had increased PVCs so milrinone was cut back to 0.25 mcg.   Amio started yesterday. Developed nausea after starting amio and received zofran with no improvement. Todays CO-OX 59%.   Today she is complaining of headache and nausea. Headache improves with fentanyl. Nausea improves with zofran. Says she would rather stop amio than keep it due to nausea. Mild dyspnea at rest.   Swan numbers  CVP 17 PAP 78/39 (51)  Wedge 30  CO/CI   2.2/1.2  SVR   PVR 9.5   Studies: RHC 10/17  RA = 19 RV = 70/9/19 PA = 69/31 (45) PCW = 27 Fick cardiac output/index = 2.3/1.4 Thermo cardiac output/index = 2.3/1.4 PVR = 7.8 WU Ao sat = 94% PA sat = 35%, 38%  Objective:   Weight Range:  Vital Signs:   Temp:  [97.7 F (36.5 C)-99.1 F (37.3 C)] 97.9 F (36.6 C) (10/19 0739) Pulse Rate:  [69-98] 76 (10/19 0600) Resp:  [13-24] 21 (10/19 0600) BP: (88-149)/(55-110) 117/90 mmHg (10/19 0600) SpO2:  [89 %-98 %] 96 % (10/19 0600) Weight:  [151 lb 14.4 oz (68.9 kg)] 151 lb 14.4 oz (68.9 kg) (10/19 0500) Last BM Date: 10/30/14  Weight change: Filed Weights   10/29/14 1140 10/30/14 0500 10/31/14 0500  Weight: 151 lb 7.3 oz (68.7 kg) 149 lb 14.4 oz (67.994 kg) 151 lb 14.4 oz (68.9 kg)    Intake/Output:   Intake/Output Summary (Last 24 hours) at 10/31/14 0744 Last data filed at 10/31/14 0600  Gross per 24 hour  Intake 1125.71 ml  Output   1470 ml  Net -344.29 ml     Physical Exam: CVP 17 GEN- The patient is elderly appearing, alert and oriented x 3 today.  HEENT: normal Neck. RIJ swan. supple Lungs- Clear to ausculation bilaterally, normal work of breathing. No wheezes, rales, rhonchi Heart- PMI laterally displaced. Mildly irregular GI- soft, non-tender, non-distended, bowel sounds  present  Extremities- no clubbing, cyanosis, trace edema; DP/PT/radial pulses 1+ bilaterally MS- no significant deformity or atrophy Skin- warm and dry, no rash or lesion; ICD pocket well healed Psych- euthymic mood, full affect Neuro- strength and sensation are intact  Telemetry:  Afib 90s   Labs: Basic Metabolic Panel:  Recent Labs Lab 10/29/14 0800 10/29/14 2211 10/30/14 0559 10/31/14 0440  NA 138 132* 137 135  K 3.6 4.5 3.8 4.7  CL 101 97* 100* 100*  CO2 25 27 27 27   GLUCOSE 98 133* 88 119*  BUN 38* 36* 34* 26*  CREATININE 1.84* 1.83* 1.85* 1.71*  CALCIUM 9.1 8.3* 8.6* 8.8*  MG  --  2.3  --   --     Liver Function Tests: No results for input(s): AST, ALT, ALKPHOS, BILITOT, PROT, ALBUMIN in the last 168 hours. No results for input(s): LIPASE, AMYLASE in the last 168 hours. No results for input(s): AMMONIA in the last 168 hours.  CBC:  Recent Labs Lab 10/29/14 0800 10/31/14 0440  WBC 6.3 7.4  HGB 12.2 12.0  HCT 38.8 39.4  MCV 89.4 90.8  PLT 176 185    Cardiac Enzymes: No results for input(s): CKTOTAL, CKMB, CKMBINDEX, TROPONINI in the last 168 hours.  BNP: BNP (last 3 results) No results for input(s): BNP in the last 8760 hours.  ProBNP (last 3  results) No results for input(s): PROBNP in the last 8760 hours.    Other results:  Imaging: No results found.   Medications:     Scheduled Medications: . ezetimibe  10 mg Oral Q M,W,F  . fluticasone  2 spray Each Nare Daily  . furosemide  40 mg Intravenous BID  . levothyroxine  100 mcg Oral Q breakfast  . polyethylene glycol  17 g Oral Daily  . potassium chloride  20 mEq Oral BID  . warfarin  2.5 mg Oral Once per day on Sun Tue Wed Thu Sat  . warfarin  5 mg Oral Once per day on Mon Fri  . Warfarin - Pharmacist Dosing Inpatient   Does not apply q1800    Infusions: . amiodarone 30 mg/hr (10/30/14 2210)  . milrinone 0.25 mcg/kg/min (10/30/14 2112)    PRN Medications: acetaminophen,  albuterol, fentaNYL (SUBLIMAZE) injection, ondansetron (ZOFRAN) IV, ondansetron (ZOFRAN) IV, zolpidem   Assessment:   1. Acute on chronic systolic HF due to NICM EF 25% 2. Cardiogenic shock 3. PAF   - Tikosyn topped recently due to worsening CKD   - unable to tolerate amio due to n/v 4. CKD, stage 4 5. Hypothyroidism  Plan/Discussion:   Swan numbers reviewed/performed by Dr Haroldine Laws. Hemodynamics remain poor. Volume status elevated. Increase IV lasix to 80 mg twice daily. CO-OX today 59%. Increase  milrinone to see if that helps with afterload. Increase to 0.375 mcg to see if this helps will need to watch closely for PVCs. If intolerant may try dobutamine. Hold off on hydralazine for now.    In the past was on tikosyn but later stopped due to renal function. Intolerant amio. Tried amio drip again but developed nausea. Stop amio.  Maintaining A fib 90s. INR 2.66 Pharmacy dosing coumadin. EP consulted.   Limited options given age. Family aware.  Length of Stay: 2 Amy Clegg NP-C 10/31/2014, 7:44 AM Advanced Heart Failure Team Pager [phone] (M-F; 7a - 4p)  Please contact [facility] for night-coverage after hours (4p -7a ) and weekends on [email or web address]   Patient seen and examined with Darrick Grinder, NP. We discussed all aspects of the encounter. I agree with the assessment and plan as stated above.   Hemodynamics much worse. She is unable to tolerate amiodarone at all. Will stop amio. Increase milrinone. Options very limited. If we can improved HF will need to consider AV Node ablation but currently not stable enough for procedure. May be nearing point for Hospice involvement. Not candidate for VAD due to advanced age.   The patient is critically ill with multiple organ systems failure and requires high complexity decision making for assessment and support, frequent evaluation and titration of therapies, application of advanced monitoring technologies and extensive interpretation of multiple  databases.   Critical Care Time devoted to patient care services described in this note is 45 Minutes.    Kamaya Keckler,MD 1:25 PM

## 2014-11-01 DIAGNOSIS — I4819 Other persistent atrial fibrillation: Secondary | ICD-10-CM | POA: Insufficient documentation

## 2014-11-01 LAB — CARBOXYHEMOGLOBIN
CARBOXYHEMOGLOBIN: 1.3 % (ref 0.5–1.5)
CARBOXYHEMOGLOBIN: 1.4 % (ref 0.5–1.5)
METHEMOGLOBIN: 0.9 % (ref 0.0–1.5)
METHEMOGLOBIN: 0.8 % (ref 0.0–1.5)
O2 SAT: 51.8 %
O2 Saturation: 47 %
TOTAL HEMOGLOBIN: 13.2 g/dL (ref 12.0–16.0)
Total hemoglobin: 13.5 g/dL (ref 12.0–16.0)

## 2014-11-01 LAB — BASIC METABOLIC PANEL
Anion gap: 11 (ref 5–15)
BUN: 20 mg/dL (ref 6–20)
CHLORIDE: 95 mmol/L — AB (ref 101–111)
CO2: 31 mmol/L (ref 22–32)
CREATININE: 1.69 mg/dL — AB (ref 0.44–1.00)
Calcium: 9.6 mg/dL (ref 8.9–10.3)
GFR calc non Af Amer: 27 mL/min — ABNORMAL LOW (ref >60)
GFR, EST AFRICAN AMERICAN: 32 mL/min — AB (ref >60)
Glucose, Bld: 92 mg/dL (ref 65–99)
POTASSIUM: 4 mmol/L (ref 3.5–5.1)
Sodium: 137 mmol/L (ref 135–145)

## 2014-11-01 LAB — PROTIME-INR
INR: 2.22 — AB (ref 0.00–1.49)
PROTHROMBIN TIME: 24.4 s — AB (ref 11.6–15.2)

## 2014-11-01 MED ORDER — WARFARIN SODIUM 5 MG PO TABS
5.0000 mg | ORAL_TABLET | Freq: Once | ORAL | Status: AC
  Administered 2014-11-01: 5 mg via ORAL
  Filled 2014-11-01: qty 1

## 2014-11-01 MED ORDER — FUROSEMIDE 10 MG/ML IJ SOLN
80.0000 mg | Freq: Two times a day (BID) | INTRAMUSCULAR | Status: DC
  Administered 2014-11-01 (×2): 80 mg via INTRAVENOUS
  Filled 2014-11-01 (×2): qty 8

## 2014-11-01 MED ORDER — MIDODRINE HCL 5 MG PO TABS
2.5000 mg | ORAL_TABLET | Freq: Three times a day (TID) | ORAL | Status: DC
  Administered 2014-11-01: 2.5 mg via ORAL
  Filled 2014-11-01: qty 1

## 2014-11-01 MED ORDER — HYDRALAZINE HCL 10 MG PO TABS
10.0000 mg | ORAL_TABLET | Freq: Three times a day (TID) | ORAL | Status: DC
  Administered 2014-11-01: 10 mg via ORAL
  Filled 2014-11-01: qty 1

## 2014-11-01 NOTE — Progress Notes (Addendum)
SUBJECTIVE: The patient today is feeling much better.  Since the change in her medicines, (off amiodarone and milrinone, and on Dobutamine) she feels much better without nausea or headache, she ate some today as well.    Her CO was noted to be better on the higher dose of dobutamine last night, but had to be down-titrated with tachycardia.  She is negative >3011ml in last 24 hours.  CURRENT MEDICATIONS: . ezetimibe  10 mg Oral Q M,W,F  . fluticasone  2 spray Each Nare Daily  . furosemide  80 mg Intravenous BID  . levothyroxine  100 mcg Oral Q breakfast  . midodrine  2.5 mg Oral TID WC  . polyethylene glycol  17 g Oral Daily  . potassium chloride  20 mEq Oral Daily  . warfarin  5 mg Oral ONCE-1800  . Warfarin - Pharmacist Dosing Inpatient   Does not apply q1800   . DOBUTamine 2.5 mcg/kg/min (10/31/14 1820)    OBJECTIVE: Physical Exam: Filed Vitals:   11/01/14 1200 11/01/14 1300 11/01/14 1400 11/01/14 1443  BP: 136/76 128/89 84/53 105/73  Pulse: 132 87 90 76  Temp: 99 F (37.2 C) 99 F (37.2 C) 99.5 F (37.5 C) 99.3 F (37.4 C)  TempSrc: Core (Comment)     Resp: 13 18 19 20   Height:      Weight:      SpO2: 87% 95% 92% 92%    Intake/Output Summary (Last 24 hours) at 11/01/14 1454 Last data filed at 11/01/14 1400  Gross per 24 hour  Intake 300.65 ml  Output   4275 ml  Net -3974.35 ml    Telemetry reveals atrial fibrillation in the 90's  GEN- The patient is elderly appearing, alert and oriented x 3 today.   Head- normocephalic, atraumatic Eyes-  Sclera clear, conjunctiva pink Ears- hearing intact Oropharynx- clear Neck- supple  Lungs- Clear to ausculation bilaterally, normal work of breathing Heart- Irregular rate and rhythm  GI- soft, NT, ND, + BS Extremities- no clubbing, cyanosis, trace if any edema remains Skin- no rash or lesion, ICD pocket well healed Psych- euthymic mood, full affect Neuro- no gross deficits  LABS: Basic Metabolic Panel:  Recent  Labs  10/29/14 2211  10/31/14 0440 11/01/14 0440  NA 132*  < > 135 137  K 4.5  < > 4.7 4.0  CL 97*  < > 100* 95*  CO2 27  < > 27 31  GLUCOSE 133*  < > 119* 92  BUN 36*  < > 26* 20  CREATININE 1.83*  < > 1.71* 1.69*  CALCIUM 8.3*  < > 8.8* 9.6  MG 2.3  --   --   --   < > = values in this interval not displayed.   CBC:  Recent Labs  10/31/14 0440  WBC 7.4  HGB 12.0  HCT 39.4  MCV 90.8  PLT 185   INR 2.22  Thyroid Function Tests:  Recent Labs  10/30/14 0800  TSH 1.552   RADIOLOGY: Dg Chest 2 View 10/23/2014  CLINICAL DATA:  Chest pressure, atrial fibrillation EXAM: CHEST  2 VIEW COMPARISON:  12/18/2012 FINDINGS: Cardiomegaly again noted. Three leads cardiac pacemaker is unchanged in position. No acute infiltrate or pleural effusion. No pulmonary edema. Osteopenia and degenerative changes thoracic spine. IMPRESSION: No active cardiopulmonary disease. Electronically Signed   By: Lahoma Crocker M.D.   On: 10/23/2014 19:22    ASSESSMENT AND PLAN:  Active Problems:   Acute on chronic systolic congestive  heart failure ([place])   Cardiogenic shock ([place])  1.  Persistent atrial fibrillation The patient has persistent atrial fibrillation that is exacerbating heart failure. Was previously on Tikosyn with worsening renal function and so this was discontinued.  Started on po amiodarone as an outpatient but unable to tolerate 2/2 nausea.  RHC done this admission with shock physiology and started on IV amiodarone with recurrent nausea. Amiodarone now discontinued, and her Milrinone was stopped secondary to headache and she is on Dobutamine currently  Unfortunately, AVN ablation is likely only option. Dr Caryl Comes is her EP and knows her well.  He has discussed AVN ablation in the past, but she has been reluctant to be device dependant. She understands that we are very limited in medical options at this point.  Continue Warfarin for CHADS2VASC of at least 5 being managed with pharmacy here  2.   Acute on chronic systolic heart failure/cardiogenic shock Management per AHF team  Tommye Standard, PA-C  11/01/2014 2:54 PM  I have seen, examined the patient, and reviewed the above assessment and plan.  Remains quite ill but has had a better day.  Continues to have elevated V rates with AF.  Not really any good AAD options.  I think that we should consider AV nodal ablation but would like to have Dr Caryl Comes weigh in on this when he returns next week. Changes to above are made where necessary.    EP to see as needed while being managed by CHF team. Will plan to have Dr Caryl Comes see again early next week.  Co Sign: Thompson Grayer, MD 11/01/2014 4:04 PM

## 2014-11-01 NOTE — Progress Notes (Signed)
Unable to obtain CVP's. Swan removed and no access for CVP hook up. Plan is for PICC to be placed tomorrow. Will inform day shift RN to resume CVP monitoring when PICC is placed.

## 2014-11-01 NOTE — Care Management Important Message (Signed)
Important Message  Patient Details  Name: Sandra [name] MRN: [id number] Date of Birth: 1932-10-06   Medicare Important Message Given:  Yes-second notification given    Delorse Lek 11/01/2014, 12:07 PM

## 2014-11-01 NOTE — Progress Notes (Signed)
Advanced Heart Failure Rounding Note   Subjective:    Admitted from cath lab. Cardiogenic shock. Started on milrinone 0.25 mcg  and diuresed with IV lasix. Milrinone was increased to 0.375 mcg but had increased PVCs so milrinone was cut back to 0.25 mcg.   Intolerant amio with nausea. Yesterday milrinone stopped and started on dobutamine 5 mcg due to low cardiac output. Dobutamine was cut back to 2.5 mcg due to tachycardia.  Also diuresed with 80 mg IV lasix every 8 hours. Brisk diuresis noted. Weight down 7 pounds.   Today she is feeling better. Headache and nausea resolved.   Swan numbers  CVP 9 PAP  79/44 Wedge 29 CO/CI   2.4/1.4   SVR  3045  PVR 669 dynes   Studies: RHC 10/17  RA = 19 RV = 70/9/19 PA = 69/31 (45) PCW = 27 Fick cardiac output/index = 2.3/1.4 Thermo cardiac output/index = 2.3/1.4 PVR = 7.8 WU Ao sat = 94% PA sat = 35%, 38%  Objective:   Weight Range:  Vital Signs:   Temp:  [97.3 F (36.3 C)-99.3 F (37.4 C)] 98.8 F (37.1 C) (10/20 0600) Pulse Rate:  [75-155] 85 (10/20 0600) Resp:  [13-27] 15 (10/20 0600) BP: (101-160)/(59-109) 120/76 mmHg (10/20 0600) SpO2:  [88 %-99 %] 97 % (10/20 0600) Weight:  [144 lb 6.4 oz (65.5 kg)] 144 lb 6.4 oz (65.5 kg) (10/20 0457) Last BM Date: 10/30/14  Weight change: Filed Weights   10/30/14 0500 10/31/14 0500 11/01/14 0457  Weight: 149 lb 14.4 oz (67.994 kg) 151 lb 14.4 oz (68.9 kg) 144 lb 6.4 oz (65.5 kg)    Intake/Output:   Intake/Output Summary (Last 24 hours) at 11/01/14 0723 Last data filed at 11/01/14 0600  Gross per 24 hour  Intake 581.28 ml  Output   4300 ml  Net -3718.72 ml     Physical Exam: CVP 9 GEN- The patient is elderly appearing, HEENT: normal Neck. RIJ swan. supple Lungs- Clear to ausculation bilaterally, normal work of breathing. No wheezes, rales, rhonchi Heart- PMI laterally displaced. Mildly irregular GI- soft, non-tender, non-distended, bowel sounds present   Extremities- no clubbing, cyanosis, trace edema; DP/PT/radial pulses 1+ bilaterally MS- no significant deformity or atrophy Skin- warm and dry, no rash or lesion; ICD pocket well healed Neuro- Alert and oriented x3.   Telemetry:  Afib 90s   Labs: Basic Metabolic Panel:  Recent Labs Lab 10/29/14 0800 10/29/14 2211 10/30/14 0559 10/31/14 0440 11/01/14 0440  NA 138 132* 137 135 137  K 3.6 4.5 3.8 4.7 4.0  CL 101 97* 100* 100* 95*  CO2 25 27 27 27 31   GLUCOSE 98 133* 88 119* 92  BUN 38* 36* 34* 26* 20  CREATININE 1.84* 1.83* 1.85* 1.71* 1.69*  CALCIUM 9.1 8.3* 8.6* 8.8* 9.6  MG  --  2.3  --   --   --     Liver Function Tests: No results for input(s): AST, ALT, ALKPHOS, BILITOT, PROT, ALBUMIN in the last 168 hours. No results for input(s): LIPASE, AMYLASE in the last 168 hours. No results for input(s): AMMONIA in the last 168 hours.  CBC:  Recent Labs Lab 10/29/14 0800 10/31/14 0440  WBC 6.3 7.4  HGB 12.2 12.0  HCT 38.8 39.4  MCV 89.4 90.8  PLT 176 185    Cardiac Enzymes: No results for input(s): CKTOTAL, CKMB, CKMBINDEX, TROPONINI in the last 168 hours.  BNP: BNP (last 3 results) No results for input(s): BNP in  the last 8760 hours.  ProBNP (last 3 results) No results for input(s): PROBNP in the last 8760 hours.    Other results:  Imaging: No results found.   Medications:     Scheduled Medications: . ezetimibe  10 mg Oral Q M,W,F  . fluticasone  2 spray Each Nare Daily  . furosemide  80 mg Intravenous TID  . levothyroxine  100 mcg Oral Q breakfast  . polyethylene glycol  17 g Oral Daily  . potassium chloride  20 mEq Oral Daily  . warfarin  2.5 mg Oral Once per day on Sun Tue Wed Thu Sat  . warfarin  5 mg Oral Once per day on Mon Fri  . Warfarin - Pharmacist Dosing Inpatient   Does not apply q1800    Infusions: . DOBUTamine 2.5 mcg/kg/min (10/31/14 1820)    PRN Medications: acetaminophen, albuterol, ALPRAZolam, fentaNYL (SUBLIMAZE)  injection, ondansetron (ZOFRAN) IV, ondansetron (ZOFRAN) IV, traMADol, zolpidem   Assessment:   1. Acute on chronic systolic HF due to NICM EF 25% 2. Cardiogenic shock 3. PAF   - Tikosyn topped recently due to worsening CKD   - unable to tolerate amio due to n/v 4. CKD, stage 4 5. Hypothyroidism  Plan/Discussion:   Hemodynamics improved last night but today back down. CO-OX remains marginal. Continue dobutamine 2.5 mcg. Brisk diuresis over night. CVP 9. Cut back lasix 80 mg twice daily. Add hydralazine 10 mg tid. No ace with CKD. No BB with cardiogenic shock. Renal function ok.   In the past was on tikosyn but later stopped due to renal function. Intolerant amio due to nausea.   Maintaining A fib 90s. INR 2.66 Pharmacy dosing coumadin. EP consult appreciated.      Length of Stay: 3 Amy Clegg NP-C 11/01/2014, 7:23 AM Advanced Heart Failure Team Pager [phone] (M-F; 7a - 4p)  Please contact [facility] for night-coverage after hours (4p -7a ) and weekends on [email or web address]  Patient seen and examined with Darrick Grinder, NP. We discussed all aspects of the encounter. I agree with the assessment and plan as stated above.   She has failed milrinone and amiodarone due to side effects. Now on dobutamine. Seems to be tolerating well and feeling better however co-ox remains low. Dose limited by tachycardia. We reprogrammed device with [employer] reps help to limit tachycardia. Will increase dobutamine to 3 mcg/kg/min. Volume status much improved. Can pull Swan. Place PICC. We will see how she does over the weekend. If relatively stable will discuss AVN ablation with Dr. Caryl Comes to permit biv pacingand also limit tachycardia response to dobutamine.   The patient is critically ill with multiple organ systems failure and requires high complexity decision making for assessment and support, frequent evaluation and titration of therapies, application of advanced monitoring technologies and extensive  interpretation of multiple databases.   Critical Care Time devoted to patient care services described in this note is 45 Minutes.  Yafet Cline,MD 10:25 PM

## 2014-11-01 NOTE — Progress Notes (Signed)
ANTICOAGULATION CONSULT NOTE - Follow Up Consult  Pharmacy Consult for Coumadin Indication: atrial fibrillation  Allergies  Allergen Reactions  . Levofloxacin Other (See Comments)    Due to cardiac  AF  . Procaine Hcl Other (See Comments)    REACTION: smothers  . Spironolactone Diarrhea    Abdominal pain  . Amiodarone Nausea And Vomiting  . Amoxicillin Other (See Comments)    Rash   . Codeine Other (See Comments)    REACTION: nausea  . Niacin Other (See Comments)    REACTION: rash  . Ramipril Other (See Comments)    unknown  . Statins Other (See Comments)    REACTION: muscle aches    Patient Measurements: Height: 5\' 3"  (160 cm) Weight: 144 lb 6.4 oz (65.5 kg) IBW/kg (Calculated) : 52.4  Vital Signs: Temp: 98.4 F (36.9 C) (10/20 0726) Temp Source: Core (Comment) (10/20 0400) BP: 128/86 mmHg (10/20 0726) Pulse Rate: 85 (10/20 0726)  Labs:  Recent Labs  10/29/14 0800  10/30/14 0559 10/31/14 0440 11/01/14 0440  HGB 12.2  --   --  12.0  --   HCT 38.8  --   --  39.4  --   PLT 176  --   --  185  --   LABPROT 25.8*  --  24.6* 28.0* 24.4*  INR 2.39*  --  2.24* 2.66* 2.22*  CREATININE 1.84*  < > 1.85* 1.71* 1.69*  < > = values in this interval not displayed.  Estimated Creatinine Clearance: 24.1 mL/min (by C-G formula based on Cr of 1.69).  Assessment: 82yof continues on coumadin for afib. INR remains therapeutic at 2.22 but falling slightly - now off amiodarone and increased CO with dobutamine and improves UOP may increase liver perfusions and metabolism.  Also In Afib and discussion of cardioversion - will ensure INR stays above 2.  CBC stable. No bleeding reported.  Goal of Therapy:  INR 2-3 Monitor platelets by anticoagulation protocol: Yes   Plan:  Coumadin 5mg  x1 today 2) Daily INR  Bonnita Nasuti Pharm.D. CPP, BCPS Clinical Pharmacist [phone] 11/01/2014 7:43 AM

## 2014-11-02 ENCOUNTER — Inpatient Hospital Stay (HOSPITAL_COMMUNITY): Payer: Medicare Other

## 2014-11-02 DIAGNOSIS — N390 Urinary tract infection, site not specified: Secondary | ICD-10-CM

## 2014-11-02 LAB — PROTIME-INR
INR: 1.97 — ABNORMAL HIGH (ref 0.00–1.49)
Prothrombin Time: 22.3 seconds — ABNORMAL HIGH (ref 11.6–15.2)

## 2014-11-02 LAB — URINALYSIS, ROUTINE W REFLEX MICROSCOPIC
Bilirubin Urine: NEGATIVE
GLUCOSE, UA: NEGATIVE mg/dL
Ketones, ur: NEGATIVE mg/dL
Nitrite: POSITIVE — AB
PH: 8 (ref 5.0–8.0)
Protein, ur: 30 mg/dL — AB
Specific Gravity, Urine: 1.012 (ref 1.005–1.030)
Urobilinogen, UA: 1 mg/dL (ref 0.0–1.0)

## 2014-11-02 LAB — BASIC METABOLIC PANEL
Anion gap: 12 (ref 5–15)
BUN: 26 mg/dL — ABNORMAL HIGH (ref 6–20)
CALCIUM: 9.4 mg/dL (ref 8.9–10.3)
CO2: 33 mmol/L — AB (ref 22–32)
CREATININE: 1.91 mg/dL — AB (ref 0.44–1.00)
Chloride: 91 mmol/L — ABNORMAL LOW (ref 101–111)
GFR calc non Af Amer: 24 mL/min — ABNORMAL LOW (ref >60)
GFR, EST AFRICAN AMERICAN: 27 mL/min — AB (ref >60)
GLUCOSE: 80 mg/dL (ref 65–99)
Potassium: 3.9 mmol/L (ref 3.5–5.1)
Sodium: 136 mmol/L (ref 135–145)

## 2014-11-02 LAB — URINE MICROSCOPIC-ADD ON

## 2014-11-02 LAB — CARBOXYHEMOGLOBIN
CARBOXYHEMOGLOBIN: 1.4 % (ref 0.5–1.5)
Methemoglobin: 0.7 % (ref 0.0–1.5)
O2 SAT: 60.6 %
Total hemoglobin: 13.9 g/dL (ref 12.0–16.0)

## 2014-11-02 MED ORDER — SODIUM CHLORIDE 0.9 % IJ SOLN
10.0000 mL | Freq: Two times a day (BID) | INTRAMUSCULAR | Status: DC
  Administered 2014-11-02 – 2014-11-12 (×9): 10 mL

## 2014-11-02 MED ORDER — DEXTROSE 5 % IV SOLN
1.0000 g | INTRAVENOUS | Status: AC
  Administered 2014-11-02 – 2014-11-04 (×3): 1 g via INTRAVENOUS
  Filled 2014-11-02 (×3): qty 10

## 2014-11-02 MED ORDER — SODIUM CHLORIDE 0.9 % IV SOLN
INTRAVENOUS | Status: DC
  Administered 2014-11-02 – 2014-11-10 (×4): via INTRAVENOUS

## 2014-11-02 MED ORDER — SODIUM CHLORIDE 0.9 % IJ SOLN: 10.0000 mL | INTRAMUSCULAR | Status: DC | PRN

## 2014-11-02 MED ORDER — WARFARIN SODIUM 5 MG PO TABS
5.0000 mg | ORAL_TABLET | Freq: Once | ORAL | Status: AC
  Administered 2014-11-02: 5 mg via ORAL
  Filled 2014-11-02: qty 1

## 2014-11-02 NOTE — Progress Notes (Signed)
Peripherally Inserted Central Catheter/Midline Placement  The IV Nurse has discussed with the patient and/or persons authorized to consent for the patient, the purpose of this procedure and the potential benefits and risks involved with this procedure.  The benefits include less needle sticks, lab draws from the catheter and patient may be discharged home with the catheter.  Risks include, but not limited to, infection, bleeding, blood clot (thrombus formation), and puncture of an artery; nerve damage and irregular heat beat.  Alternatives to this procedure were also discussed.  PICC/Midline Placement Documentation        Yosselyn Tax, Nicolette Bang 11/02/2014, 3:31 PM

## 2014-11-02 NOTE — Progress Notes (Signed)
Came to ambulate however pt getting PICC. Will f/u in am. Yves Dill CES, ACSM 3:16 PM 11/02/2014

## 2014-11-02 NOTE — Progress Notes (Signed)
ANTICOAGULATION CONSULT NOTE - Follow Up Consult  Pharmacy Consult for Coumadin Indication: atrial fibrillation  Allergies  Allergen Reactions  . Levofloxacin Other (See Comments)    Due to cardiac  AF  . Procaine Hcl Other (See Comments)    REACTION: smothers  . Spironolactone Diarrhea    Abdominal pain  . Amiodarone Nausea And Vomiting  . Amoxicillin Other (See Comments)    Rash   . Codeine Other (See Comments)    REACTION: nausea  . Niacin Other (See Comments)    REACTION: rash  . Ramipril Other (See Comments)    unknown  . Statins Other (See Comments)    REACTION: muscle aches    Patient Measurements: Height: 5\' 3"  (160 cm) Weight: 142 lb 13.7 oz (64.8 kg) IBW/kg (Calculated) : 52.4  Vital Signs: Temp: 98 F (36.7 C) (10/21 1100) Temp Source: Oral (10/21 1100) BP: 108/58 mmHg (10/21 1200) Pulse Rate: 90 (10/21 0900)  Labs:  Recent Labs  10/31/14 0440 11/01/14 0440 11/02/14 0400  HGB 12.0  --   --   HCT 39.4  --   --   PLT 185  --   --   LABPROT 28.0* 24.4* 22.3*  INR 2.66* 2.22* 1.97*  CREATININE 1.71* 1.69* 1.91*    Estimated Creatinine Clearance: 21.3 mL/min (by C-G formula based on Cr of 1.91).  Assessment: 82yof continues on coumadin for afib. INR 1.97 dropped to sub- therapeutic  She is now off amiodarone and increased CO with dobutamine and improves UOP may increase liver perfusions and metabolism.   CBC stable. No bleeding reported.  PTA Warfarin 5mg  MF / 2.5mg  AOD  Goal of Therapy:  INR 2-3 Monitor platelets by anticoagulation protocol: Yes   Plan:  Coumadin 5mg  x1 again today Daily INR  Bonnita Nasuti Pharm.D. CPP, BCPS Clinical Pharmacist [phone] 11/02/2014 2:24 PM

## 2014-11-02 NOTE — Progress Notes (Signed)
Advanced Heart Failure Rounding Note   Subjective:    Admitted from cath lab. Cardiogenic shock. Started on milrinone 0.25 mcg  and diuresed with IV lasix. Milrinone was increased to 0.375 mcg but had increased PVCs so milrinone was cut back to 0.25 mcg.   Intolerant amio with nausea. Milrinone stopped and switched to dobutamine.   Yesterday hydralazine added and later stopped due to hypotension. Continues on dobutamine 3 mcg. Todays is CO-OX 61%. Creatinine up to 1.91. Weight down 2 pounds.   Denies SOB/dizziness.    [place] numbers  CVP 9 PAP  79/44 Wedge 29 CO/CI   2.4/1.4   SVR  3045  PVR 669 dynes   Studies: RHC 10/17  RA = 19 RV = 70/9/19 PA = 69/31 (45) PCW = 27 Fick cardiac output/index = 2.3/1.4 Thermo cardiac output/index = 2.3/1.4 PVR = 7.8 WU Ao sat = 94% PA sat = 35%, 38%  Objective:   Weight Range:  Vital Signs:   Temp:  [98.2 F (36.8 C)-99.5 F (37.5 C)] 99.2 F (37.3 C) (10/21 0400) Pulse Rate:  [34-159] 81 (10/21 0600) Resp:  [13-24] 18 (10/21 0600) BP: (84-138)/(35-94) 106/62 mmHg (10/21 0600) SpO2:  [87 %-99 %] 95 % (10/21 0600) Weight:  [142 lb 13.7 oz (64.8 kg)] 142 lb 13.7 oz (64.8 kg) (10/21 0425) Last BM Date: 10/30/14  Weight change: Filed Weights   10/31/14 0500 11/01/14 0457 11/02/14 0425  Weight: 151 lb 14.4 oz (68.9 kg) 144 lb 6.4 oz (65.5 kg) 142 lb 13.7 oz (64.8 kg)    Intake/Output:   Intake/Output Summary (Last 24 hours) at 11/02/14 0735 Last data filed at 11/02/14 0600  Gross per 24 hour  Intake 496.05 ml  Output   2820 ml  Net -2323.95 ml     Physical Exam:  GEN- The patient is elderly appearing, HEENT: normal Neck.  Supple. JVP 5-6  Lungs- Clear to ausculation bilaterally, normal work of breathing. No wheezes, rales, rhonchi Heart- PMI laterally displaced. Mildly irregular GI- soft, non-tender, non-distended, bowel sounds present  Extremities- no clubbing, cyanosis, trace edema; DP/PT/radial pulses 1+  bilaterally MS- no significant deformity or atrophy Skin- warm and dry, no rash or lesion; ICD pocket well healed Neuro- Alert and oriented x3.   Telemetry:  Afib 90s   Labs: Basic Metabolic Panel:  Recent Labs Lab 10/29/14 2211 10/30/14 0559 10/31/14 0440 11/01/14 0440 11/02/14 0400  NA 132* 137 135 137 136  K 4.5 3.8 4.7 4.0 3.9  CL 97* 100* 100* 95* 91*  CO2 27 27 27 31  33*  GLUCOSE 133* 88 119* 92 80  BUN 36* 34* 26* 20 26*  CREATININE 1.83* 1.85* 1.71* 1.69* 1.91*  CALCIUM 8.3* 8.6* 8.8* 9.6 9.4  MG 2.3  --   --   --   --     Liver Function Tests: No results for input(s): AST, ALT, ALKPHOS, BILITOT, PROT, ALBUMIN in the last 168 hours. No results for input(s): LIPASE, AMYLASE in the last 168 hours. No results for input(s): AMMONIA in the last 168 hours.  CBC:  Recent Labs Lab 10/29/14 0800 10/31/14 0440  WBC 6.3 7.4  HGB 12.2 12.0  HCT 38.8 39.4  MCV 89.4 90.8  PLT 176 185    Cardiac Enzymes: No results for input(s): CKTOTAL, CKMB, CKMBINDEX, TROPONINI in the last 168 hours.  BNP: BNP (last 3 results) No results for input(s): BNP in the last 8760 hours.  ProBNP (last 3 results) No results for input(s):  PROBNP in the last 8760 hours.    Other results:  Imaging: No results found.   Medications:     Scheduled Medications: . ezetimibe  10 mg Oral Q M,W,F  . fluticasone  2 spray Each Nare Daily  . furosemide  80 mg Intravenous BID  . levothyroxine  100 mcg Oral Q breakfast  . polyethylene glycol  17 g Oral Daily  . potassium chloride  20 mEq Oral Daily  . Warfarin - Pharmacist Dosing Inpatient   Does not apply q1800    Infusions: . DOBUTamine 3 mcg/kg/min (11/01/14 1730)    PRN Medications: acetaminophen, albuterol, ALPRAZolam, fentaNYL (SUBLIMAZE) injection, ondansetron (ZOFRAN) IV, ondansetron (ZOFRAN) IV, traMADol, zolpidem   Assessment:   1. Acute on chronic systolic HF due to NICM EF 25% 2. Cardiogenic shock 3. PAF   -  Tikosyn topped recently due to worsening CKD   - unable to tolerate amio due to n/v 4. CKD, stage 4 5. Hypothyroidism  Plan/Discussion:   Todays CO-OX 61%. Renal function up. Volume status stable. Hold diuretics today. Continue dobutamine 3 mcg. Yesterday 10 mg hydralazine added but she was hypotensive so this was stopped.  No ace with CKD. No BB with cardiogenic shock.     In the past was on tikosyn but later stopped due to renal function. Intolerant amio due to nausea.   Maintaining A fib 90s. INR 1.97.  Pharmacy dosing coumadin.   Remove foley. Consult cardiac rehab.     Length of Stay: 4 Amy Clegg NP-C 11/02/2014, 7:35 AM Advanced Heart Failure Team Pager [phone] (M-F; 7a - 4p)  Please contact [facility] for night-coverage after hours (4p -7a ) and weekends on [email or web address]   Patient seen and examined with Darrick Grinder, NP. We discussed all aspects of the encounter. I agree with the assessment and plan as stated above.   Feels much better on dobutamine 3 mcg/kg/min. Co-ox improved to 60%. CVP down to 6. Cr up slightly to 1.9. Will continue dobutamine over the weekend (unable to tolerate higher doses due to tachycardia). Will plan AVN ablation early next week. Start ceftriaxone for UTI. Await culture.  PICC placed. Follow coox and CVP. Need to ambulate.    Bensimhon, Daniel,MD 5:24 PM

## 2014-11-03 LAB — BASIC METABOLIC PANEL
ANION GAP: 11 (ref 5–15)
BUN: 26 mg/dL — ABNORMAL HIGH (ref 6–20)
CALCIUM: 9.3 mg/dL (ref 8.9–10.3)
CO2: 30 mmol/L (ref 22–32)
CREATININE: 1.7 mg/dL — AB (ref 0.44–1.00)
Chloride: 93 mmol/L — ABNORMAL LOW (ref 101–111)
GFR, EST AFRICAN AMERICAN: 32 mL/min — AB (ref >60)
GFR, EST NON AFRICAN AMERICAN: 27 mL/min — AB (ref >60)
Glucose, Bld: 98 mg/dL (ref 65–99)
Potassium: 4 mmol/L (ref 3.5–5.1)
Sodium: 134 mmol/L — ABNORMAL LOW (ref 135–145)

## 2014-11-03 LAB — CARBOXYHEMOGLOBIN
CARBOXYHEMOGLOBIN: 1.5 % (ref 0.5–1.5)
Carboxyhemoglobin: 0.9 % (ref 0.5–1.5)
METHEMOGLOBIN: 0.7 % (ref 0.0–1.5)
Methemoglobin: 0.6 % (ref 0.0–1.5)
O2 Saturation: 29.3 %
O2 Saturation: 60.4 %
TOTAL HEMOGLOBIN: 16.6 g/dL — AB (ref 12.0–16.0)
TOTAL HEMOGLOBIN: 11.6 g/dL — AB (ref 12.0–16.0)

## 2014-11-03 LAB — URINE CULTURE
CULTURE: NO GROWTH
Special Requests: NORMAL

## 2014-11-03 LAB — PROTIME-INR
INR: 2.27 — AB (ref 0.00–1.49)
Prothrombin Time: 24.8 seconds — ABNORMAL HIGH (ref 11.6–15.2)

## 2014-11-03 MED ORDER — WARFARIN SODIUM 5 MG PO TABS
5.0000 mg | ORAL_TABLET | Freq: Once | ORAL | Status: AC
  Administered 2014-11-03: 5 mg via ORAL
  Filled 2014-11-03: qty 1

## 2014-11-03 NOTE — Progress Notes (Signed)
Transferred to room 2h22. Sandra [name]

## 2014-11-03 NOTE — Progress Notes (Signed)
CARDIAC REHAB PHASE I   PRE:  Rate/Rhythm: 98 afib  BP:  Supine: 119/72  Sitting:   Standing:    SaO2: 93%RA  MODE:  Ambulation: 270 ft   POST:  Rate/Rhythm: 104  BP:  Supine:   Sitting: 139/91  Standing:    SaO2: 93-94%RA 1000-1040 Pt walked 270 ft on RA with asst x 1. Tendency to lean backwards a little. Encouraged her to be up with asst only. Pt stopped many times during walk as she would pause when talking. Tolerated well.  To recliner with call bell. Daughter in room. Sats good on RA.   Graylon Good, RN BSN  11/03/2014 10:35 AM

## 2014-11-03 NOTE — Progress Notes (Signed)
ANTICOAGULATION CONSULT NOTE - Follow Up Consult  Pharmacy Consult for Coumadin Indication: atrial fibrillation  Allergies  Allergen Reactions  . Levofloxacin Other (See Comments)    Due to cardiac  AF  . Procaine Hcl Other (See Comments)    REACTION: smothers  . Spironolactone Diarrhea    Abdominal pain  . Amiodarone Nausea And Vomiting  . Amoxicillin Other (See Comments)    Rash   . Codeine Other (See Comments)    REACTION: nausea  . Niacin Other (See Comments)    REACTION: rash  . Ramipril Other (See Comments)    unknown  . Statins Other (See Comments)    REACTION: muscle aches    Patient Measurements: Height: 5\' 3"  (160 cm) Weight: 145 lb 4.5 oz (65.9 kg) IBW/kg (Calculated) : 52.4  Vital Signs: Temp: 98.4 F (36.9 C) (10/22 0400) Temp Source: Oral (10/22 0400) BP: 136/81 mmHg (10/22 0600) Pulse Rate: 86 (10/22 0600)  Labs:  Recent Labs  11/01/14 0440 11/02/14 0400 11/03/14 0400  LABPROT 24.4* 22.3* 24.8*  INR 2.22* 1.97* 2.27*  CREATININE 1.69* 1.91* 1.70*    Estimated Creatinine Clearance: 24.1 mL/min (by C-G formula based on Cr of 1.7).  Assessment: 82yof continues on coumadin for afib. INR on admit therapeutic at 2.39 (CHADSVASc 5). INR today back up to therapeutic at 2.27 with increased doses given. She is now off amiodarone and increased CO with dobutamine and improves UOP may increase liver perfusion and metabolism.   CBC stable. No bleeding reported.  PTA Warfarin 5mg  MF / 2.5mg  AOD  Goal of Therapy:  INR 2-3 Monitor platelets by anticoagulation protocol: Yes   Plan:  Coumadin 5mg  x1 again today Daily INR  Sherrin Stahle K. Velva Harman, PharmD, BCPS, CPP Clinical Pharmacist Pager: [phone] Phone: [phone] 11/03/2014 8:09 AM

## 2014-11-03 NOTE — Progress Notes (Signed)
Advanced Heart Failure Rounding Note   Subjective:    Feels much better. Remains on dobutamine at 3. Walking halls with nurse.   Objective:   Weight Range:  Vital Signs:   Temp:  [97.9 F (36.6 C)-98.4 F (36.9 C)] 98 F (36.7 C) (10/22 0800) Pulse Rate:  [86-107] 86 (10/22 0600) Resp:  [18] 18 (10/21 1600) BP: (95-145)/(58-94) 136/81 mmHg (10/22 0600) SpO2:  [88 %-98 %] 93 % (10/22 0600) Weight:  [65.9 kg (145 lb 4.5 oz)] 65.9 kg (145 lb 4.5 oz) (10/22 0500) Last BM Date: 10/31/14  Weight change: Filed Weights   11/01/14 0457 11/02/14 0425 11/03/14 0500  Weight: 65.5 kg (144 lb 6.4 oz) 64.8 kg (142 lb 13.7 oz) 65.9 kg (145 lb 4.5 oz)    Intake/Output:   Intake/Output Summary (Last 24 hours) at 11/03/14 1102 Last data filed at 11/03/14 0800  Gross per 24 hour  Intake    934 ml  Output   1900 ml  Net   -966 ml     Physical Exam:  GEN- Sitting in chair HEENT: normal Neck.  Supple. JVP 7-8 Lungs- Clear to ausculation bilaterally, normal work of breathing. No wheezes, rales, rhonchi Heart- PMI laterally displaced. Mildly irregular GI- soft, non-tender, non-distended, bowel sounds present  Extremities- no clubbing, cyanosis, trace edema; DP/PT/radial pulses 1+ bilaterally MS- no significant deformity or atrophy Skin- warm and dry, no rash or lesion; ICD pocket well healed Neuro- Alert and oriented x3.   Telemetry:  Afib 80s   Labs: Basic Metabolic Panel:  Recent Labs Lab 10/29/14 2211 10/30/14 0559 10/31/14 0440 11/01/14 0440 11/02/14 0400 11/03/14 0400  NA 132* 137 135 137 136 134*  K 4.5 3.8 4.7 4.0 3.9 4.0  CL 97* 100* 100* 95* 91* 93*  CO2 27 27 27 31  33* 30  GLUCOSE 133* 88 119* 92 80 98  BUN 36* 34* 26* 20 26* 26*  CREATININE 1.83* 1.85* 1.71* 1.69* 1.91* 1.70*  CALCIUM 8.3* 8.6* 8.8* 9.6 9.4 9.3  MG 2.3  --   --   --   --   --     Liver Function Tests: No results for input(s): AST, ALT, ALKPHOS, BILITOT, PROT, ALBUMIN in the last  168 hours. No results for input(s): LIPASE, AMYLASE in the last 168 hours. No results for input(s): AMMONIA in the last 168 hours.  CBC:  Recent Labs Lab 10/29/14 0800 10/31/14 0440  WBC 6.3 7.4  HGB 12.2 12.0  HCT 38.8 39.4  MCV 89.4 90.8  PLT 176 185    Cardiac Enzymes: No results for input(s): CKTOTAL, CKMB, CKMBINDEX, TROPONINI in the last 168 hours.  BNP: BNP (last 3 results) No results for input(s): BNP in the last 8760 hours.  ProBNP (last 3 results) No results for input(s): PROBNP in the last 8760 hours.    Other results:  Imaging: Dg Chest Port 1 View  11/02/2014  CLINICAL DATA:  PICC line placement EXAM: PORTABLE CHEST 1 VIEW COMPARISON:  10/23/2014 FINDINGS: Right internal jugular vascular sheath is in place with the tip in the SVC. Right PICC line has been placed. The tip also in the SVC. Left pacer is unchanged. Cardiomegaly. Lungs are clear. No effusions or edema. No acute bony abnormality. IMPRESSION: Right internal jugular vascular sheath and right PICC line tips are in the SVC. No pneumothorax. Cardiomegaly. Electronically Signed   By: Rolm Baptise M.D.   On: 11/02/2014 16:20     Medications:  Scheduled Medications: . cefTRIAXone (ROCEPHIN)  IV  1 g Intravenous Q24H  . ezetimibe  10 mg Oral Q M,W,F  . fluticasone  2 spray Each Nare Daily  . levothyroxine  100 mcg Oral Q breakfast  . polyethylene glycol  17 g Oral Daily  . potassium chloride  20 mEq Oral Daily  . sodium chloride  10-40 mL Intracatheter Q12H  . warfarin  5 mg Oral ONCE-1800  . Warfarin - Pharmacist Dosing Inpatient   Does not apply q1800    Infusions: . sodium chloride 10 mL/hr at 11/02/14 1600  . DOBUTamine 3 mcg/kg/min (11/02/14 1900)    PRN Medications: acetaminophen, albuterol, ALPRAZolam, fentaNYL (SUBLIMAZE) injection, ondansetron (ZOFRAN) IV, ondansetron (ZOFRAN) IV, sodium chloride, traMADol, zolpidem   Assessment:   1. Acute on chronic systolic HF due to NICM  EF 25% 2. Cardiogenic shock 3. PAF   - Tikosyn topped recently due to worsening CKD   - unable to tolerate amio due to n/v 4. CKD, stage 4 5. Hypothyroidism  Plan/Discussion:    Feels much better on dobutamine 3 mcg/kg/min. Co-ox improved to 60%. CVP 7-8. Cr down to 1.7. Will continue dobutamine over the weekend (unable to tolerate higher doses due to tachycardia). Will plan AVN ablation early next week. On ceftriaxone for UTI. Syptoms improved. Await culture.  Can go to SDU. Continue to ambulate.    Length of Stay: 5 Bensimhon, Daniel MD  11/03/2014, 11:02 AM Advanced Heart Failure Team Pager [phone] (M-F; [place])  Please contact [facility] for night-coverage after hours (4p -7a ) and weekends on [email or web address]

## 2014-11-04 LAB — CBC
HEMATOCRIT: 41.8 % (ref 36.0–46.0)
Hemoglobin: 13.3 g/dL (ref 12.0–15.0)
MCH: 28.2 pg (ref 26.0–34.0)
MCHC: 31.8 g/dL (ref 30.0–36.0)
MCV: 88.7 fL (ref 78.0–100.0)
PLATELETS: 188 10*3/uL (ref 150–400)
RBC: 4.71 MIL/uL (ref 3.87–5.11)
RDW: 15.9 % — AB (ref 11.5–15.5)
WBC: 7.6 10*3/uL (ref 4.0–10.5)

## 2014-11-04 LAB — BASIC METABOLIC PANEL
ANION GAP: 9 (ref 5–15)
BUN: 21 mg/dL — ABNORMAL HIGH (ref 6–20)
CALCIUM: 9 mg/dL (ref 8.9–10.3)
CO2: 27 mmol/L (ref 22–32)
Chloride: 96 mmol/L — ABNORMAL LOW (ref 101–111)
Creatinine, Ser: 1.38 mg/dL — ABNORMAL HIGH (ref 0.44–1.00)
GFR, EST AFRICAN AMERICAN: 41 mL/min — AB (ref >60)
GFR, EST NON AFRICAN AMERICAN: 35 mL/min — AB (ref >60)
Glucose, Bld: 160 mg/dL — ABNORMAL HIGH (ref 65–99)
Potassium: 4 mmol/L (ref 3.5–5.1)
SODIUM: 132 mmol/L — AB (ref 135–145)

## 2014-11-04 LAB — CARBOXYHEMOGLOBIN
CARBOXYHEMOGLOBIN: 1.5 % (ref 0.5–1.5)
Methemoglobin: 0.7 % (ref 0.0–1.5)
O2 SAT: 58.6 %
TOTAL HEMOGLOBIN: 13.3 g/dL (ref 12.0–16.0)

## 2014-11-04 LAB — PROTIME-INR
INR: 2.25 — ABNORMAL HIGH (ref 0.00–1.49)
Prothrombin Time: 24.6 seconds — ABNORMAL HIGH (ref 11.6–15.2)

## 2014-11-04 MED ORDER — FUROSEMIDE 80 MG PO TABS
80.0000 mg | ORAL_TABLET | Freq: Every day | ORAL | Status: DC
Start: 2014-11-04 — End: 2014-11-05
  Administered 2014-11-04 – 2014-11-05 (×2): 80 mg via ORAL
  Filled 2014-11-04 (×2): qty 1

## 2014-11-04 MED ORDER — WARFARIN SODIUM 5 MG PO TABS
5.0000 mg | ORAL_TABLET | Freq: Once | ORAL | Status: AC
  Administered 2014-11-04: 5 mg via ORAL
  Filled 2014-11-04: qty 1

## 2014-11-04 NOTE — Progress Notes (Addendum)
Advanced Heart Failure Rounding Note   Subjective:    Feels much better. No dyspnea.  Remains on dobutamine at 3. Walking halls. Labs pending.   Objective:   Weight Range:  Vital Signs:   Temp:  [97.4 F (36.3 C)-98.4 F (36.9 C)] 98 F (36.7 C) (10/23 0733) Pulse Rate:  [81-82] 81 (10/23 0400) Resp:  [18-20] 18 (10/23 0733) BP: (92-139)/(59-91) 110/59 mmHg (10/23 0733) SpO2:  [94 %-96 %] 96 % (10/23 0733) Weight:  [65.9 kg (145 lb 4.5 oz)] 65.9 kg (145 lb 4.5 oz) (10/23 0444) Last BM Date: 10/31/14  Weight change: Filed Weights   11/02/14 0425 11/03/14 0500 11/04/14 0444  Weight: 64.8 kg (142 lb 13.7 oz) 65.9 kg (145 lb 4.5 oz) 65.9 kg (145 lb 4.5 oz)    Intake/Output:   Intake/Output Summary (Last 24 hours) at 11/04/14 0758 Last data filed at 11/04/14 0400  Gross per 24 hour  Intake 1075.1 ml  Output   1175 ml  Net  -99.9 ml     Physical Exam:  GEN- Sitting in bed HEENT: normal Neck.  Supple. JVP 8 Lungs- Clear to ausculation bilaterally, normal work of breathing. No wheezes, rales, rhonchi Heart- PMI laterally displaced. Mildly irregular GI- soft, non-tender, non-distended, bowel sounds present  Extremities- no clubbing, cyanosis, trace edema; DP/PT/radial pulses 1+ bilaterally MS- no significant deformity or atrophy Skin- warm and dry, no rash or lesion; ICD pocket well healed Neuro- Alert and oriented x3.   Telemetry:  Afib 80s   Labs: Basic Metabolic Panel:  Recent Labs Lab 10/29/14 2211 10/30/14 0559 10/31/14 0440 11/01/14 0440 11/02/14 0400 11/03/14 0400  NA 132* 137 135 137 136 134*  K 4.5 3.8 4.7 4.0 3.9 4.0  CL 97* 100* 100* 95* 91* 93*  CO2 27 27 27 31  33* 30  GLUCOSE 133* 88 119* 92 80 98  BUN 36* 34* 26* 20 26* 26*  CREATININE 1.83* 1.85* 1.71* 1.69* 1.91* 1.70*  CALCIUM 8.3* 8.6* 8.8* 9.6 9.4 9.3  MG 2.3  --   --   --   --   --     Liver Function Tests: No results for input(s): AST, ALT, ALKPHOS, BILITOT, PROT, ALBUMIN  in the last 168 hours. No results for input(s): LIPASE, AMYLASE in the last 168 hours. No results for input(s): AMMONIA in the last 168 hours.  CBC:  Recent Labs Lab 10/29/14 0800 10/31/14 0440 11/04/14 0423  WBC 6.3 7.4 7.6  HGB 12.2 12.0 13.3  HCT 38.8 39.4 41.8  MCV 89.4 90.8 88.7  PLT 176 185 188    Cardiac Enzymes: No results for input(s): CKTOTAL, CKMB, CKMBINDEX, TROPONINI in the last 168 hours.  BNP: BNP (last 3 results) No results for input(s): BNP in the last 8760 hours.  ProBNP (last 3 results) No results for input(s): PROBNP in the last 8760 hours.    Other results:  Imaging: Dg Chest Port 1 View  11/02/2014  CLINICAL DATA:  PICC line placement EXAM: PORTABLE CHEST 1 VIEW COMPARISON:  10/23/2014 FINDINGS: Right internal jugular vascular sheath is in place with the tip in the SVC. Right PICC line has been placed. The tip also in the SVC. Left pacer is unchanged. Cardiomegaly. Lungs are clear. No effusions or edema. No acute bony abnormality. IMPRESSION: Right internal jugular vascular sheath and right PICC line tips are in the SVC. No pneumothorax. Cardiomegaly. Electronically Signed   By: Rolm Baptise M.D.   On: 11/02/2014 16:20  Medications:     Scheduled Medications: . cefTRIAXone (ROCEPHIN)  IV  1 g Intravenous Q24H  . ezetimibe  10 mg Oral Q M,W,F  . fluticasone  2 spray Each Nare Daily  . levothyroxine  100 mcg Oral Q breakfast  . polyethylene glycol  17 g Oral Daily  . potassium chloride  20 mEq Oral Daily  . sodium chloride  10-40 mL Intracatheter Q12H  . Warfarin - Pharmacist Dosing Inpatient   Does not apply q1800    Infusions: . sodium chloride 10 mL/hr at 11/02/14 1600  . DOBUTamine 3 mcg/kg/min (11/03/14 1355)    PRN Medications: acetaminophen, albuterol, ALPRAZolam, fentaNYL (SUBLIMAZE) injection, ondansetron (ZOFRAN) IV, ondansetron (ZOFRAN) IV, sodium chloride, traMADol, zolpidem   Assessment:   1. Acute on chronic systolic  HF due to NICM EF 25% 2. Cardiogenic shock 3. PAF   - Tikosyn topped recently due to worsening CKD   - unable to tolerate amio due to n/v 4. CKD, stage 4 5. Hypothyroidism  Plan/Discussion:    Feels much better on dobutamine 3 mcg/kg/min. Co-ox improved to 60% yesterday. CVP 7-8. Co-ox and BMET pending.  Will continue dobutamine over the weekend (unable to tolerate higher doses due to tachycardia). Will plan AVN ablation early next week. On ceftriaxone for UTI. Syptoms improved. Urineculture negative.   Keep in SDU. Will discuss with Dr. Caryl Comes in am.    Length of Stay: 6 Eliseo Withers MD  11/04/2014, 7:58 AM Advanced Heart Failure Team Pager [phone] (M-F; [place])  Please contact [facility] for night-coverage after hours (4p -7a ) and weekends on [email or web address]

## 2014-11-04 NOTE — Progress Notes (Signed)
ANTICOAGULATION CONSULT NOTE - Follow Up Consult  Pharmacy Consult for Coumadin Indication: atrial fibrillation  Allergies  Allergen Reactions  . Levofloxacin Other (See Comments)    Due to cardiac  AF  . Procaine Hcl Other (See Comments)    REACTION: smothers  . Spironolactone Diarrhea    Abdominal pain  . Amiodarone Nausea And Vomiting  . Amoxicillin Other (See Comments)    Rash   . Codeine Other (See Comments)    REACTION: nausea  . Niacin Other (See Comments)    REACTION: rash  . Ramipril Other (See Comments)    unknown  . Statins Other (See Comments)    REACTION: muscle aches    Patient Measurements: Height: 5\' 3"  (160 cm) Weight: 145 lb 4.5 oz (65.9 kg) IBW/kg (Calculated) : 52.4  Vital Signs: Temp: 98 F (36.7 C) (10/23 0733) Temp Source: Oral (10/23 0733) BP: 110/59 mmHg (10/23 0733) Pulse Rate: 81 (10/23 0400)  Labs:  Recent Labs  11/02/14 0400 11/03/14 0400 11/04/14 0423  HGB  --   --  13.3  HCT  --   --  41.8  PLT  --   --  188  LABPROT 22.3* 24.8* 24.6*  INR 1.97* 2.27* 2.25*  CREATININE 1.91* 1.70*  --     Estimated Creatinine Clearance: 24.1 mL/min (by C-G formula based on Cr of 1.7).  Assessment: 82yof continues on coumadin for afib. INR on admit therapeutic at 2.39 (CHADSVASc 5). INR remains therapeutic at 2.25 with increased doses given. She is now off amiodarone and increased CO with dobutamine and improves UOP may increase liver perfusion and metabolism.   CBC stable. No bleeding reported.  PTA Warfarin 5mg  MF / 2.5mg  AOD  Goal of Therapy:  INR 2-3 Monitor platelets by anticoagulation protocol: Yes   Plan:  Coumadin 5 mg x1 again today Daily INR  Darryll Raju K. Velva Harman, PharmD, BCPS, CPP Clinical Pharmacist Pager: [phone] Phone: [phone] 11/04/2014 8:27 AM

## 2014-11-05 LAB — BASIC METABOLIC PANEL
ANION GAP: 8 (ref 5–15)
BUN: 21 mg/dL — AB (ref 6–20)
CALCIUM: 8.8 mg/dL — AB (ref 8.9–10.3)
CO2: 28 mmol/L (ref 22–32)
Chloride: 98 mmol/L — ABNORMAL LOW (ref 101–111)
Creatinine, Ser: 1.37 mg/dL — ABNORMAL HIGH (ref 0.44–1.00)
GFR calc Af Amer: 41 mL/min — ABNORMAL LOW (ref >60)
GFR, EST NON AFRICAN AMERICAN: 35 mL/min — AB (ref >60)
GLUCOSE: 102 mg/dL — AB (ref 65–99)
POTASSIUM: 3.8 mmol/L (ref 3.5–5.1)
SODIUM: 134 mmol/L — AB (ref 135–145)

## 2014-11-05 LAB — CARBOXYHEMOGLOBIN
Carboxyhemoglobin: 1 % (ref 0.5–1.5)
Methemoglobin: 0.8 % (ref 0.0–1.5)
O2 Saturation: 53.5 %
TOTAL HEMOGLOBIN: 12.6 g/dL (ref 12.0–16.0)

## 2014-11-05 LAB — PROTIME-INR
INR: 2.37 — AB (ref 0.00–1.49)
PROTHROMBIN TIME: 25.6 s — AB (ref 11.6–15.2)

## 2014-11-05 MED ORDER — WARFARIN SODIUM 5 MG PO TABS
5.0000 mg | ORAL_TABLET | Freq: Once | ORAL | Status: AC
  Administered 2014-11-05: 5 mg via ORAL
  Filled 2014-11-05: qty 1

## 2014-11-05 MED ORDER — FUROSEMIDE 10 MG/ML IJ SOLN
80.0000 mg | Freq: Two times a day (BID) | INTRAMUSCULAR | Status: AC
  Administered 2014-11-05 (×2): 80 mg via INTRAVENOUS
  Filled 2014-11-05 (×2): qty 8

## 2014-11-05 NOTE — Care Management Important Message (Signed)
Important Message  Patient Details  Name: Sandra [name] MRN: [id number] Date of Birth: [birth year]   Medicare Important Message Given:  Yes-third notification given    Delorse Lek 11/05/2014, 12:15 PM

## 2014-11-05 NOTE — Progress Notes (Signed)
CARDIAC REHAB PHASE I   PRE:  Rate/Rhythm: 111 afib  BP:  Supine:   Sitting: 140/91  Standing:    SaO2: 92%RA  MODE:  Ambulation: 350 ft   POST:  Rate/Rhythm: 130 afib  BP:  Supine:   Sitting: 144/92  Standing:    SaO2: 97%RA 1405-1442 Pt walked 350 ft on RA with hand held asst with steady gait. Tolerated well. Pt tired by end of walk but talking and walking at same time. Gave pt CHF booklet and reviewed when to call MD. Pt knew to call with weight gain of 3 pounds overnight or 5 in week. Gave low sodium diets and discussed 2000 mg restriction. Pt to bed after walk. Voiced concern about procedure tomorrow. Emotional support given.   Graylon Good, RN BSN  11/05/2014 2:39 PM

## 2014-11-05 NOTE — Progress Notes (Signed)
Advanced Heart Failure Rounding Note   Subjective:      Remains on dobutamine at 3. CVP 13-14. Weight up 1 pound.   Complaining of pain behind R knee. "Cramp" Denies SOB. Ambulating.   Todays labs CO-OX 54% K 3.8 Creatinine 1.37 INR 2.37   Objective:   Weight Range:  Vital Signs:   Temp:  [97.8 F (36.6 C)-98 F (36.7 C)] 97.9 F (36.6 C) (10/24 0402) Resp:  [16-20] 20 (10/24 0402) BP: (110-142)/(54-96) 127/81 mmHg (10/24 0402) SpO2:  [96 %-98 %] 98 % (10/24 0402) Weight:  [146 lb 8 oz (66.452 kg)] 146 lb 8 oz (66.452 kg) (10/24 0402) Last BM Date: 11/04/14  Weight change: Filed Weights   11/03/14 0500 11/04/14 0444 11/05/14 0402  Weight: 145 lb 4.5 oz (65.9 kg) 145 lb 4.5 oz (65.9 kg) 146 lb 8 oz (66.452 kg)    Intake/Output:   Intake/Output Summary (Last 24 hours) at 11/05/14 0724 Last data filed at 11/05/14 0500  Gross per 24 hour  Intake 1158.2 ml  Output   2225 ml  Net -1066.8 ml     Physical Exam: CVP 14 GEN- Sitting in bed HEENT: normal Neck.  Supple. JVP to jaw  Lungs- Clear to ausculation bilaterally, normal work of breathing. No wheezes, rales, rhonchi Heart- PMI laterally displaced. Mildly irregular GI- soft, non-tender, non-distended, bowel sounds present  Extremities- no clubbing, cyanosis, trace edema; DP/PT/radial pulses 1+ bilaterally MS- no significant deformity or atrophy Skin- warm and dry, no rash or lesion; ICD pocket well healed Neuro- Alert and oriented x3.   Telemetry:  Afib `100s    Labs: Basic Metabolic Panel:  Recent Labs Lab 10/29/14 2211  10/31/14 0440 11/01/14 0440 11/02/14 0400 11/03/14 0400 11/04/14 0922  NA 132*  < > 135 137 136 134* 132*  K 4.5  < > 4.7 4.0 3.9 4.0 4.0  CL 97*  < > 100* 95* 91* 93* 96*  CO2 27  < > 27 31 33* 30 27  GLUCOSE 133*  < > 119* 92 80 98 160*  BUN 36*  < > 26* 20 26* 26* 21*  CREATININE 1.83*  < > 1.71* 1.69* 1.91* 1.70* 1.38*  CALCIUM 8.3*  < > 8.8* 9.6 9.4 9.3 9.0  MG 2.3   --   --   --   --   --   --   < > = values in this interval not displayed.  Liver Function Tests: No results for input(s): AST, ALT, ALKPHOS, BILITOT, PROT, ALBUMIN in the last 168 hours. No results for input(s): LIPASE, AMYLASE in the last 168 hours. No results for input(s): AMMONIA in the last 168 hours.  CBC:  Recent Labs Lab 10/29/14 0800 10/31/14 0440 11/04/14 0423  WBC 6.3 7.4 7.6  HGB 12.2 12.0 13.3  HCT 38.8 39.4 41.8  MCV 89.4 90.8 88.7  PLT 176 185 188    Cardiac Enzymes: No results for input(s): CKTOTAL, CKMB, CKMBINDEX, TROPONINI in the last 168 hours.  BNP: BNP (last 3 results) No results for input(s): BNP in the last 8760 hours.  ProBNP (last 3 results) No results for input(s): PROBNP in the last 8760 hours.    Other results:  Imaging: No results found.   Medications:     Scheduled Medications: . ezetimibe  10 mg Oral Q M,W,F  . fluticasone  2 spray Each Nare Daily  . furosemide  80 mg Oral Daily  . levothyroxine  100 mcg Oral Q breakfast  .  polyethylene glycol  17 g Oral Daily  . potassium chloride  20 mEq Oral Daily  . sodium chloride  10-40 mL Intracatheter Q12H  . Warfarin - Pharmacist Dosing Inpatient   Does not apply q1800    Infusions: . sodium chloride 10 mL/hr at 11/05/14 0500  . DOBUTamine 3 mcg/kg/min (11/05/14 0500)    PRN Medications: acetaminophen, albuterol, ALPRAZolam, fentaNYL (SUBLIMAZE) injection, ondansetron (ZOFRAN) IV, ondansetron (ZOFRAN) IV, sodium chloride, traMADol, zolpidem   Assessment:   1. Acute on chronic systolic HF due to NICM EF 25% 2. Cardiogenic shock 3. PAF   - Tikosyn topped recently due to worsening CKD   - unable to tolerate amio due to n/v 4. CKD, stage 4 5. Hypothyroidism  Plan/Discussion:    Feels much better on dobutamine 3 mcg/kg/min. Volume status elevated. Start 80 mg IV lasix x2.   Will plan AVN ablation probably tomorrow.    Completed ceftriaxone for UTI. Syptoms improved.  Urine ulture negative.   EP to follow up.    Length of Stay: [place] NP-C  11/05/2014, 7:24 AM Advanced Heart Failure Team Pager [phone] (M-F; 7a - 4p)  Please contact [facility] for night-coverage after hours (4p -7a ) and weekends on [email or web address]  Patient seen and examined with Darrick Grinder, NP. We discussed all aspects of the encounter. I agree with the assessment and plan as stated above.   Much improved on dobutamine though co-ox down slightly today. CVP up -  Agree with IV lasix today. I have d/w EP. Probably AVN ablation tomorrow.   Bensimhon, Daniel,MD 11:15 AM

## 2014-11-05 NOTE — Progress Notes (Signed)
ANTICOAGULATION CONSULT NOTE - Follow Up Consult  Pharmacy Consult for Coumadin Indication: atrial fibrillation   Patient Measurements: Height: 5\' 3"  (160 cm) Weight: 146 lb 8 oz (66.452 kg) IBW/kg (Calculated) : 52.4  Vital Signs: Temp: 97.9 F (36.6 C) (10/24 0900) Temp Source: Oral (10/24 0900) BP: 115/63 mmHg (10/24 0900) Pulse Rate: 83 (10/24 0900)  Labs:  Recent Labs  11/03/14 0400 11/04/14 0423 11/04/14 0922 11/05/14 0300  HGB  --  13.3  --   --   HCT  --  41.8  --   --   PLT  --  188  --   --   LABPROT 24.8* 24.6*  --  25.6*  INR 2.27* 2.25*  --  2.37*  CREATININE 1.70*  --  1.38* 1.37*    Estimated Creatinine Clearance: 30 mL/min (by C-G formula based on Cr of 1.37).  Assessment: 82yof continues on coumadin for afib. INR on admit therapeutic at 2.39 (CHADSVASc 5).  INR remains therapeutic at 2.3 with increased doses given. No bleeding complications noted. Possible AVN ablation when Caryl Comes is back.  PTA Warfarin 5mg  MF / 2.5mg  AOD  Goal of Therapy:  INR 2-3 Monitor platelets by anticoagulation protocol: Yes   Plan:  Coumadin 5 mg x1 again today Daily INR  Erin Hearing PharmD., BCPS Clinical Pharmacist Pager [phone] 11/05/2014 10:47 AM

## 2014-11-06 ENCOUNTER — Encounter (HOSPITAL_COMMUNITY)
Admission: RE | Disposition: A | Discharge status: Home Health | Payer: Self-pay | Source: Ambulatory Visit | Attending: Internal Medicine

## 2014-11-06 ENCOUNTER — Encounter (HOSPITAL_COMMUNITY): Payer: Self-pay | Admitting: Internal Medicine

## 2014-11-06 DIAGNOSIS — I452 Bifascicular block: Secondary | ICD-10-CM | POA: Diagnosis present

## 2014-11-06 LAB — CBC WITH DIFFERENTIAL/PLATELET
BASOS ABS: 0 10*3/uL (ref 0.0–0.1)
BASOS PCT: 0 %
Eosinophils Absolute: 0.3 10*3/uL (ref 0.0–0.7)
Eosinophils Relative: 5 %
HEMATOCRIT: 37.7 % (ref 36.0–46.0)
HEMOGLOBIN: 11.8 g/dL — AB (ref 12.0–15.0)
LYMPHS PCT: 18 %
Lymphs Abs: 1.1 10*3/uL (ref 0.7–4.0)
MCH: 27.6 pg (ref 26.0–34.0)
MCHC: 31.3 g/dL (ref 30.0–36.0)
MCV: 88.1 fL (ref 78.0–100.0)
Monocytes Absolute: 0.6 10*3/uL (ref 0.1–1.0)
Monocytes Relative: 10 %
NEUTROS ABS: 4.3 10*3/uL (ref 1.7–7.7)
NEUTROS PCT: 67 %
PLATELETS: 167 10*3/uL (ref 150–400)
RBC: 4.28 MIL/uL (ref 3.87–5.11)
RDW: 16 % — AB (ref 11.5–15.5)
WBC: 6.3 10*3/uL (ref 4.0–10.5)

## 2014-11-06 LAB — CARBOXYHEMOGLOBIN
CARBOXYHEMOGLOBIN: 1.1 % (ref 0.5–1.5)
METHEMOGLOBIN: 0.7 % (ref 0.0–1.5)
O2 Saturation: 59.2 %
Total hemoglobin: 13 g/dL (ref 12.0–16.0)

## 2014-11-06 LAB — BASIC METABOLIC PANEL
ANION GAP: 11 (ref 5–15)
BUN: 24 mg/dL — AB (ref 6–20)
CO2: 30 mmol/L (ref 22–32)
Calcium: 9.1 mg/dL (ref 8.9–10.3)
Chloride: 96 mmol/L — ABNORMAL LOW (ref 101–111)
Creatinine, Ser: 1.51 mg/dL — ABNORMAL HIGH (ref 0.44–1.00)
GFR calc Af Amer: 36 mL/min — ABNORMAL LOW (ref >60)
GFR calc non Af Amer: 31 mL/min — ABNORMAL LOW (ref >60)
GLUCOSE: 92 mg/dL (ref 65–99)
POTASSIUM: 3.9 mmol/L (ref 3.5–5.1)
Sodium: 137 mmol/L (ref 135–145)

## 2014-11-06 LAB — MAGNESIUM: Magnesium: 2.1 mg/dL (ref 1.7–2.4)

## 2014-11-06 LAB — PROTIME-INR
INR: 2.29 — ABNORMAL HIGH (ref 0.00–1.49)
Prothrombin Time: 25 seconds — ABNORMAL HIGH (ref 11.6–15.2)

## 2014-11-06 LAB — POTASSIUM: Potassium: 4.2 mmol/L (ref 3.5–5.1)

## 2014-11-06 SURGERY — AV NODE ABLATION

## 2014-11-06 MED ORDER — DOFETILIDE 250 MCG PO CAPS
250.0000 ug | ORAL_CAPSULE | Freq: Two times a day (BID) | ORAL | Status: DC
  Administered 2014-11-06 – 2014-11-13 (×15): 250 ug via ORAL
  Filled 2014-11-06 (×17): qty 1

## 2014-11-06 MED ORDER — DOBUTAMINE IN D5W 4-5 MG/ML-% IV SOLN
2.0000 ug/kg/min | INTRAVENOUS | Status: DC
  Administered 2014-11-06: 3 ug/kg/min via INTRAVENOUS
  Filled 2014-11-06: qty 250

## 2014-11-06 MED ORDER — POTASSIUM CHLORIDE CRYS ER 20 MEQ PO TBCR
20.0000 meq | EXTENDED_RELEASE_TABLET | Freq: Once | ORAL | Status: AC
  Administered 2014-11-06: 20 meq via ORAL
  Filled 2014-11-06: qty 1

## 2014-11-06 MED ORDER — WARFARIN SODIUM 5 MG PO TABS
5.0000 mg | ORAL_TABLET | Freq: Once | ORAL | Status: AC
  Administered 2014-11-06: 5 mg via ORAL
  Filled 2014-11-06: qty 1

## 2014-11-06 MED ORDER — DOFETILIDE 250 MCG PO CAPS
250.0000 ug | ORAL_CAPSULE | Freq: Two times a day (BID) | ORAL | Status: DC
Start: 2014-11-06 — End: 2014-11-06
  Filled 2014-11-06 (×3): qty 1

## 2014-11-06 NOTE — Care Management Note (Signed)
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   Expected Discharge Date:                  Expected Discharge Plan:  [place]  In-House Referral:     Discharge planning Services  CM Consult  Post Acute Care Choice:  Durable Medical Equipment, Home Health Choice offered to:  Patient  DME Arranged:  IV pump/equipment DME Agency:  [place]:  RN, Disease Management [place] Agency:  [place]  Status of Service:     Medicare Important Message Given:  Yes-third notification given Date Medicare IM Given:    Medicare IM give by:    Date Additional Medicare IM Given:    Additional Medicare Important Message give by:     If discussed at [place] of Stay Meetings, dates discussed:    Additional Comments: pt states has been on tikosyn previously. She orders her [facility]osyn from [facility]. Will give her 1 wk supply at disch.  Lacretia Leigh, RN 11/06/2014, 9:47 AM

## 2014-11-06 NOTE — Progress Notes (Signed)
CARDIAC REHAB PHASE I   PRE:  Rate/Rhythm: 90-95 afib  BP:  Supine: 137/78  Sitting:   Standing:    SaO2: 95%RA  MODE:  Ambulation: 520 ft   POST:  Rate/Rhythm: 140's afib  BP:  Supine:   Sitting: 136/90  Standing:    SaO2: 97%RA 1100-1132 Pt walked 520 ft on RA with hand held asst. Tolerated well except for elevated heart rate. Pt able to talk during walk. To bed after walk at pt's request. Heart rate back down after resting.    Graylon Good, RN BSN  11/06/2014 11:29 AM

## 2014-11-06 NOTE — Progress Notes (Signed)
ANTICOAGULATION CONSULT NOTE - Follow Up Consult  Pharmacy Consult for Coumadin Indication: atrial fibrillation   Patient Measurements: Height: 5\' 3"  (160 cm) Weight: 137 lb 2 oz (62.2 kg) IBW/kg (Calculated) : 52.4  Vital Signs: Temp: 97.8 F (36.6 C) (10/25 0759) Temp Source: Oral (10/25 0759) BP: 137/78 mmHg (10/25 1100) Pulse Rate: 89 (10/25 0759)  Labs:  Recent Labs  11/04/14 0423 11/04/14 0922 11/05/14 0300 11/06/14 0435 11/06/14 0740  HGB 13.3  --   --   --  11.8*  HCT 41.8  --   --   --  37.7  PLT 188  --   --   --  167  LABPROT 24.6*  --  25.6* 25.0*  --   INR 2.25*  --  2.37* 2.29*  --   CREATININE  --  1.38* 1.37* 1.51*  --     Estimated Creatinine Clearance: 24.6 mL/min (by C-G formula based on Cr of 1.51).  Assessment: 82yof continues on coumadin for afib. INR remains therapeutic at 2.29 with increased doses given (requiring more 5mg  doses since off amiodarone and with increased liver perfusion/cardiac output on dobutamine). No bleeding reported. Noted plan to try tikosyn.  PTA coumadin dose: 5mg  MF / 2.5mg  AOD  Goal of Therapy:  INR 2-3 Monitor platelets by anticoagulation protocol: Yes   Plan:  1) Coumadin 5mg  x 1 tonight 2) INR in AM  Nena Jordan PharmD., BCPS 11/06/2014 11:28 AM

## 2014-11-06 NOTE — Progress Notes (Signed)
Patient Name: Sandra [name]      SUBJECTIVE  Diuresis with ionotrope support Net -11L Still on DBA ECG  Underlying RBBBLAFB with rates 80-100s  Much improved as inpt   Past Medical History  Diagnosis Date  . Atrial fibrillation ([place]) permanent     on Tikosyn and Coumadin  . Hyperlipidemia   . Hypothyroidism   . Bradycardia   . Cardiomyopathy, hypertrophic nonobstructive (HCC)     ejection fraction 25%  . Anal fissure   . Adenomatous colon polyp   . Diverticulosis   . ICD-CRT     generator change 2013  . Systolic heart failure   . History of colonoscopy   . Migraines   . Hyperlipidemia   . Hypertension   . Chronic renal insufficiency, stage III (moderate)     gd 3-4  . RBBB plus LA hemiblock     Scheduled Meds:  Scheduled Meds: . ezetimibe  10 mg Oral Q M,W,F  . fluticasone  2 spray Each Nare Daily  . levothyroxine  100 mcg Oral Q breakfast  . polyethylene glycol  17 g Oral Daily  . potassium chloride  20 mEq Oral Daily  . sodium chloride  10-40 mL Intracatheter Q12H  . Warfarin - Pharmacist Dosing Inpatient   Does not apply q1800   Continuous Infusions: . sodium chloride 10 mL/hr at 11/05/14 2000  . DOBUTamine 3 mcg/kg/min (11/06/14 0741)   acetaminophen, albuterol, ALPRAZolam, fentaNYL (SUBLIMAZE) injection, ondansetron (ZOFRAN) IV, sodium chloride, traMADol, zolpidem    PHYSICAL EXAM Filed Vitals:   11/05/14 2333 11/06/14 0300 11/06/14 0759 11/06/14 0800  BP: 120/90 125/96 132/87 132/87  Pulse: 86 98 89   Temp: 98.2 F (36.8 C) 98.1 F (36.7 C) 97.8 F (36.6 C)   TempSrc: Oral Oral Oral   Resp:  16 18   Height:      Weight:  137 lb 2 oz (62.2 kg)    SpO2: 94% 96% 97%    Well developed and nourished in no acute distress HENT normal Neck supple with JVP-flat Clear Regular rate and rhythm, no murmurs or gallops Abd-soft with active BS No Clubbing cyanosis edema Skin-warm and dry A & Oriented  Grossly normal sensory and motor  function   TELEMETRY: Reviewed telemetry pt in afib flutter with modest VR   Intake/Output Summary (Last 24 hours) at 11/06/14 0839 Last data filed at 11/06/14 0800  Gross per 24 hour  Intake  782.8 ml  Output   3700 ml  Net -2917.2 ml    LABS: Basic Metabolic Panel:  Recent Labs Lab 10/31/14 0440 11/01/14 0440 11/02/14 0400 11/03/14 0400 11/04/14 0922 11/05/14 0300 11/06/14 0435  NA 135 137 136 134* 132* 134* 137  K 4.7 4.0 3.9 4.0 4.0 3.8 3.9  CL 100* 95* 91* 93* 96* 98* 96*  CO2 27 31 33* 30 27 28 30   GLUCOSE 119* 92 80 98 160* 102* 92  BUN 26* 20 26* 26* 21* 21* 24*  CREATININE 1.71* 1.69* 1.91* 1.70* 1.38* 1.37* 1.51*  CALCIUM 8.8* 9.6 9.4 9.3 9.0 8.8* 9.1   Cardiac Enzymes: No results for input(s): CKTOTAL, CKMB, CKMBINDEX, TROPONINI in the last 72 hours. CBC:  Recent Labs Lab 10/31/14 0440 11/04/14 0423 11/06/14 0740  WBC 7.4 7.6 6.3  NEUTROABS  --   --  4.3  HGB 12.0 13.3 11.8*  HCT 39.4 41.8 37.7  MCV 90.8 88.7 88.1  PLT 185 188 167   PROTIME:  Recent Labs  11/04/14 0423 11/05/14 0300 11/06/14 0435  LABPROT 24.6* 25.6* 25.0*  INR 2.25* 2.37* 2.29*       ASSESSMENT AND PLAN:  Active Problems:   Biventricular implantable cardioverter-defibrillator -St. Jude   Hypertrophic obstructive cardiomyopathy (HCC)   Chronic kidney disease, stage III (moderate)   Acute on chronic systolic congestive heart failure (HCC)   Cardiogenic shock (HCC)   Persistent atrial fibrillation (HCC)   RBBB plus LA hemiblock  Vastly impoved following ionotropic supported diuresis Anticipated AV ablation today which will improve rate control and hopefully reverse some of her cardiomyopathy-rate related Would be infavor of resuming dofetlide also at 125 and see how she does.  When it wsa last stopped her weight was 154 , ie > 15% above durrent weight and implications on LA size and AFib may be sufficient that we can have sinus also  I discussed this with Dr. Ronna Polio. His inclination was that if we're able to try to resume antiarrhythmic therapy with the hopes that maintaining sinus rhythm now that her hemodynamics are improved he is in agreement that we should postpone AV junction ablation.  I discussed this with the patient and her 3 daughters.  We'll begin dofetilide. Her GFR is in the 30s. Not withstanding, we will begin her on 250 g twice daily realizing that there is a defibrillator in place. Her amiodarone exposure was minimal.   Signed, Virl Axe MD  11/06/2014

## 2014-11-06 NOTE — Progress Notes (Signed)
See Dr Olin Pia full progress note from today - plan to retry Tikosyn at 274mcg twice daily (despite renal function) with improved heart failure status and ICD in place to see if that will maintain SR.  If not, will plan for AVN ablation.  Dr Caryl Comes aware of 24 hours of IV amiodarone last week, ok to start Tikosyn today per him. Will add Magnesium level to today's labs and give 73meq of K now.   Chanetta Marshall, NP 11/06/2014 9:13 AM

## 2014-11-06 NOTE — Progress Notes (Signed)
Advanced Heart Failure Rounding Note   Subjective:      Remains on dobutamine at 3.  Yesterday she diuresed with IV lasix. . Weight down 10 pounds. Ambulated 350 feet.   Denies SOB.  Todays labs CO-OX 59% INR 2.29  Objective:   Weight Range:  Vital Signs:   Temp:  [97.3 F (36.3 C)-98.3 F (36.8 C)] 98.1 F (36.7 C) (10/25 0300) Pulse Rate:  [83-98] 98 (10/25 0300) Resp:  [16-22] 16 (10/25 0300) BP: (108-138)/(63-96) 125/96 mmHg (10/25 0300) SpO2:  [94 %-96 %] 96 % (10/25 0300) Weight:  [137 lb 2 oz (62.2 kg)] 137 lb 2 oz (62.2 kg) (10/25 0300) Last BM Date: 11/05/14  Weight change: Filed Weights   11/04/14 0444 11/05/14 0402 11/06/14 0300  Weight: 145 lb 4.5 oz (65.9 kg) 146 lb 8 oz (66.452 kg) 137 lb 2 oz (62.2 kg)    Intake/Output:   Intake/Output Summary (Last 24 hours) at 11/06/14 0718 Last data filed at 11/06/14 0414  Gross per 24 hour  Intake  770.3 ml  Output   3700 ml  Net -2929.7 ml     Physical Exam: CVP 7 GEN- Sitting in bed HEENT: normal Neck.  Supple. JVP 7-8   Lungs- Clear to ausculation bilaterally, normal work of breathing. No wheezes, rales, rhonchi Heart- PMI laterally displaced. Mildly irregular GI- soft, non-tender, non-distended, bowel sounds present  Extremities- no clubbing, cyanosis, trace edema; DP/PT/radial pulses 1+ bilaterally MS- no significant deformity or atrophy Skin- warm and dry, no rash or lesion; ICD pocket well healed Neuro- Alert and oriented x3.   Telemetry:  Afib 100s    Labs: Basic Metabolic Panel:  Recent Labs Lab 11/02/14 0400 11/03/14 0400 11/04/14 0922 11/05/14 0300 11/06/14 0435  NA 136 134* 132* 134* 137  K 3.9 4.0 4.0 3.8 3.9  CL 91* 93* 96* 98* 96*  CO2 33* 30 27 28 30   GLUCOSE 80 98 160* 102* 92  BUN 26* 26* 21* 21* 24*  CREATININE 1.91* 1.70* 1.38* 1.37* 1.51*  CALCIUM 9.4 9.3 9.0 8.8* 9.1    Liver Function Tests: No results for input(s): AST, ALT, ALKPHOS, BILITOT, PROT, ALBUMIN  in the last 168 hours. No results for input(s): LIPASE, AMYLASE in the last 168 hours. No results for input(s): AMMONIA in the last 168 hours.  CBC:  Recent Labs Lab 10/31/14 0440 11/04/14 0423  WBC 7.4 7.6  HGB 12.0 13.3  HCT 39.4 41.8  MCV 90.8 88.7  PLT 185 188    Cardiac Enzymes: No results for input(s): CKTOTAL, CKMB, CKMBINDEX, TROPONINI in the last 168 hours.  BNP: BNP (last 3 results) No results for input(s): BNP in the last 8760 hours.  ProBNP (last 3 results) No results for input(s): PROBNP in the last 8760 hours.    Other results:  Imaging: No results found.   Medications:     Scheduled Medications: . ezetimibe  10 mg Oral Q M,W,F  . fluticasone  2 spray Each Nare Daily  . levothyroxine  100 mcg Oral Q breakfast  . polyethylene glycol  17 g Oral Daily  . potassium chloride  20 mEq Oral Daily  . sodium chloride  10-40 mL Intracatheter Q12H  . Warfarin - Pharmacist Dosing Inpatient   Does not apply q1800    Infusions: . sodium chloride 10 mL/hr at 11/05/14 2000  . DOBUTamine 3 mcg/kg/min (11/05/14 2000)    PRN Medications: acetaminophen, albuterol, ALPRAZolam, fentaNYL (SUBLIMAZE) injection, ondansetron (ZOFRAN) IV, sodium chloride, traMADol,  zolpidem   Assessment:   1. Acute on chronic systolic HF due to NICM EF 25% 2. Cardiogenic shock 3. PAF   - Tikosyn topped recently due to worsening CKD   - unable to tolerate amio due to n/v 4. CKD, stage 4 5. Hypothyroidism  Plan/Discussion:    Stable on dobutamine 3 mcg/kg/min. Volume status improved.  CVP 7. Stop IV lasix.  Restart lasix tomorrow. Reanl function stable.   Completed ceftriaxone for UTI. Urine ulture negative.    A fib . INR 2.29 . For AVN ablation today. NPO    Length of Stay: [place] NP-C  11/06/2014, 7:18 AM Advanced Heart Failure Team Pager [phone] (M-F; 7a - 4p)  Please contact [facility] for night-coverage after hours (4p -7a ) and weekends on  [email or web address]   Patient seen and examined with Darrick Grinder, NP. We discussed all aspects of the encounter. I agree with the assessment and plan as stated above.   HF much improved on dobutamine. Case d/w Dr. Caryl Comes. Will plan re-initiation of Tikosyn today and hold off on AV node ablation. Will continue dobutamine. Restart diuresis.   Bensimhon, Daniel,MD 10:15 AM

## 2014-11-07 ENCOUNTER — Encounter: Payer: Medicare Other | Admitting: Internal Medicine

## 2014-11-07 LAB — CARBOXYHEMOGLOBIN
CARBOXYHEMOGLOBIN: 1.1 % (ref 0.5–1.5)
METHEMOGLOBIN: 0.8 % (ref 0.0–1.5)
O2 SAT: 52.6 %
TOTAL HEMOGLOBIN: 12.3 g/dL (ref 12.0–16.0)

## 2014-11-07 LAB — BASIC METABOLIC PANEL
ANION GAP: 8 (ref 5–15)
BUN: 27 mg/dL — ABNORMAL HIGH (ref 6–20)
CALCIUM: 8.9 mg/dL (ref 8.9–10.3)
CO2: 27 mmol/L (ref 22–32)
Chloride: 98 mmol/L — ABNORMAL LOW (ref 101–111)
Creatinine, Ser: 1.51 mg/dL — ABNORMAL HIGH (ref 0.44–1.00)
GFR, EST AFRICAN AMERICAN: 36 mL/min — AB (ref >60)
GFR, EST NON AFRICAN AMERICAN: 31 mL/min — AB (ref >60)
GLUCOSE: 90 mg/dL (ref 65–99)
POTASSIUM: 4.5 mmol/L (ref 3.5–5.1)
SODIUM: 133 mmol/L — AB (ref 135–145)

## 2014-11-07 LAB — PROTIME-INR
INR: 2.27 — AB (ref 0.00–1.49)
PROTHROMBIN TIME: 24.8 s — AB (ref 11.6–15.2)

## 2014-11-07 LAB — MAGNESIUM: Magnesium: 2.4 mg/dL (ref 1.7–2.4)

## 2014-11-07 MED ORDER — SODIUM CHLORIDE 0.9 % IJ SOLN
3.0000 mL | Freq: Two times a day (BID) | INTRAMUSCULAR | Status: DC
  Administered 2014-11-08 – 2014-11-11 (×3): 3 mL via INTRAVENOUS

## 2014-11-07 MED ORDER — FUROSEMIDE 10 MG/ML IJ SOLN
40.0000 mg | Freq: Two times a day (BID) | INTRAMUSCULAR | Status: DC
  Administered 2014-11-07 (×2): 40 mg via INTRAVENOUS
  Filled 2014-11-07 (×4): qty 4

## 2014-11-07 MED ORDER — SODIUM CHLORIDE 0.9 % IV SOLN: 250.0000 mL | INTRAVENOUS | Status: DC

## 2014-11-07 MED ORDER — SODIUM CHLORIDE 0.9 % IJ SOLN: 3.0000 mL | INTRAMUSCULAR | Status: DC | PRN

## 2014-11-07 MED ORDER — DOBUTAMINE IN D5W 4-5 MG/ML-% IV SOLN
5.0000 ug/kg/min | INTRAVENOUS | Status: DC
  Administered 2014-11-12: 5 ug/kg/min via INTRAVENOUS
  Filled 2014-11-07 (×2): qty 250

## 2014-11-07 MED ORDER — WARFARIN SODIUM 5 MG PO TABS
5.0000 mg | ORAL_TABLET | Freq: Once | ORAL | Status: AC
  Administered 2014-11-07: 5 mg via ORAL
  Filled 2014-11-07: qty 1

## 2014-11-07 NOTE — Progress Notes (Signed)
Advanced Heart Failure Rounding Note   Subjective:      Remains on dobutamine at 3.  Yesterday diuretics held and she was restarted on tikosyn.   Ambulated 520 feet.   Denies SOB.  Todays labs CO-OX 53% INR 2.27 Objective:   Weight Range:  Vital Signs:   Temp:  [97.7 F (36.5 C)-98.5 F (36.9 C)] 98 F (36.7 C) (10/26 0759) Pulse Rate:  [81-107] 107 (10/26 0759) Resp:  [16-18] 16 (10/26 0759) BP: (119-138)/(68-103) 132/103 mmHg (10/26 0759) SpO2:  [96 %-97 %] 97 % (10/26 0759) Weight:  [145 lb 8 oz (65.998 kg)] 145 lb 8 oz (65.998 kg) (10/26 0441) Last BM Date: 11/06/14  Weight change: Filed Weights   11/05/14 0402 11/06/14 0300 11/07/14 0441  Weight: 146 lb 8 oz (66.452 kg) 137 lb 2 oz (62.2 kg) 145 lb 8 oz (65.998 kg)    Intake/Output:   Intake/Output Summary (Last 24 hours) at 11/07/14 0827 Last data filed at 11/07/14 0700  Gross per 24 hour  Intake 1334.4 ml  Output    625 ml  Net  709.4 ml     Physical Exam: CVP 12 GEN- NAD . In bed.  HEENT: normal Neck.  Supple. JVP jaw    Lungs- Clear to ausculation bilaterally, normal work of breathing. No wheezes, rales, rhonchi Heart- PMI laterally displaced. Mildly irregular GI- soft, non-tender, non-distended, bowel sounds present  Extremities- no clubbing, cyanosis, trace edema; DP/PT/radial pulses 1+ bilaterally MS- no significant deformity or atrophy Skin- warm and dry, no rash or lesion; ICD pocket well healed Neuro- Alert and oriented x3.   Telemetry:  Afib 100s    Labs: Basic Metabolic Panel:  Recent Labs Lab 11/03/14 0400 11/04/14 0922 11/05/14 0300 11/06/14 0435 11/06/14 1405 11/07/14 0455  NA 134* 132* 134* 137  --  133*  K 4.0 4.0 3.8 3.9 4.2 4.5  CL 93* 96* 98* 96*  --  98*  CO2 30 27 28 30   --  27  GLUCOSE 98 160* 102* 92  --  90  BUN 26* 21* 21* 24*  --  27*  CREATININE 1.70* 1.38* 1.37* 1.51*  --  1.51*  CALCIUM 9.3 9.0 8.8* 9.1  --  8.9  MG  --   --   --  2.1  --  2.4     Liver Function Tests: No results for input(s): AST, ALT, ALKPHOS, BILITOT, PROT, ALBUMIN in the last 168 hours. No results for input(s): LIPASE, AMYLASE in the last 168 hours. No results for input(s): AMMONIA in the last 168 hours.  CBC:  Recent Labs Lab 11/04/14 0423 11/06/14 0740  WBC 7.6 6.3  NEUTROABS  --  4.3  HGB 13.3 11.8*  HCT 41.8 37.7  MCV 88.7 88.1  PLT 188 167    Cardiac Enzymes: No results for input(s): CKTOTAL, CKMB, CKMBINDEX, TROPONINI in the last 168 hours.  BNP: BNP (last 3 results) No results for input(s): BNP in the last 8760 hours.  ProBNP (last 3 results) No results for input(s): PROBNP in the last 8760 hours.    Other results:  Imaging: No results found.   Medications:     Scheduled Medications: . dofetilide  250 mcg Oral Q12H  . ezetimibe  10 mg Oral Q M,W,F  . fluticasone  2 spray Each Nare Daily  . levothyroxine  100 mcg Oral Q breakfast  . polyethylene glycol  17 g Oral Daily  . potassium chloride  20 mEq Oral Daily  .  sodium chloride  10-40 mL Intracatheter Q12H  . Warfarin - Pharmacist Dosing Inpatient   Does not apply q1800    Infusions: . sodium chloride 10 mL/hr at 11/06/14 1900  . DOBUTamine 3 mcg/kg/min (11/06/14 1900)    PRN Medications: acetaminophen, albuterol, ALPRAZolam, fentaNYL (SUBLIMAZE) injection, ondansetron (ZOFRAN) IV, sodium chloride, traMADol, zolpidem   Assessment:   1. Acute on chronic systolic HF due to NICM EF 25% 2. Cardiogenic shock 3. PAF   - Tikosyn stopped recently due to worsening CKD   - unable to tolerate amio due to n/v 4. CKD, stage 4 5. Hypothyroidism  Plan/Discussion:    Stable on dobutamine 3 mcg/kg/min. CO-OX today 53%.  Volume status elevated. Restarting  lasix today. CVP  10.  Renal function stable.   Completed ceftriaxone for UTI. Urine ulture negative.    A fib . INR 2.27 . Started back on tikosyn. For DC-CV.    Length of Stay: [place] NP-C  11/07/2014,  8:27 AM Advanced Heart Failure Team Pager [phone] (M-F; 7a - 4p)  Please contact [facility] for night-coverage after hours (4p -7a ) and weekends on [email or web address]  Patient seen and examined with Darrick Grinder, NP. We discussed all aspects of the encounter. I agree with the assessment and plan as stated above.   Back on Tikosyn. Feels a bit worse today. Dobutamine down to 2. CVP ~10. Will increase dobutamine back to 3. Continue Tikosyn per EP. Give IV lasix. Plan DC-CV tomorrow. Discussed with Dr. Caryl Comes.   Bensimhon, Daniel,MD 2:42 PM

## 2014-11-07 NOTE — Progress Notes (Signed)
CARDIAC REHAB PHASE I   PRE:  Rate/Rhythm: 99 afib    BP: sitting 129/96    SaO2:   MODE:  Ambulation: 220 ft   POST:  Rate/Rhythm: 116 afib    BP: sitting 154/97     SaO2:   Pt not feeling as well today. Sts she feels slightly nauseated, like she might get a HA. Also sts hands feel slightly trembly. Willing to walk but slow pace, stopped to talk with people, and decreased distance.  BP elevated, HR more controlled with activity today. Will f/u. [id number]  Sandra [name] [place] CES, ACSM 11/07/2014 9:33 AM

## 2014-11-07 NOTE — Progress Notes (Signed)
Patient Name: Sandra [name]      SUBJECTIVE  Diuresis with ionotrope support Net -11L Still on DBA ECG  Underlying RBBBLAFB with rates 80-100s  With ambulation HR >> 140s   does not feel quite as good as yesterday with vague chest discomfort  Past Medical History  Diagnosis Date  . Atrial fibrillation ([place]) permanent     on Tikosyn and Coumadin  . Hyperlipidemia   . Hypothyroidism   . Bradycardia   . Cardiomyopathy, hypertrophic nonobstructive (HCC)     ejection fraction 25%  . Anal fissure   . Adenomatous colon polyp   . Diverticulosis   . ICD-CRT     generator change 2013  . Systolic heart failure   . History of colonoscopy   . Migraines   . Hyperlipidemia   . Hypertension   . Chronic renal insufficiency, stage III (moderate)     gd 3-4  . RBBB plus LA hemiblock     Scheduled Meds:  Scheduled Meds: . dofetilide  250 mcg Oral Q12H  . ezetimibe  10 mg Oral Q M,W,F  . fluticasone  2 spray Each Nare Daily  . levothyroxine  100 mcg Oral Q breakfast  . polyethylene glycol  17 g Oral Daily  . potassium chloride  20 mEq Oral Daily  . sodium chloride  10-40 mL Intracatheter Q12H  . Warfarin - Pharmacist Dosing Inpatient   Does not apply q1800   Continuous Infusions: . sodium chloride 10 mL/hr at 11/06/14 1900  . DOBUTamine 3 mcg/kg/min (11/06/14 1900)   acetaminophen, albuterol, ALPRAZolam, fentaNYL (SUBLIMAZE) injection, ondansetron (ZOFRAN) IV, sodium chloride, traMADol, zolpidem    PHYSICAL EXAM Filed Vitals:   11/07/14 0011 11/07/14 0441 11/07/14 0449 11/07/14 0759  BP: 131/94  138/92 132/103  Pulse: 102  88 107  Temp:  98.5 F (36.9 C)  98 F (36.7 C)  TempSrc:  Oral  Oral  Resp: 18 16  16   Height:      Weight:  145 lb 8 oz (65.998 kg)    SpO2:  96%  97%   CVP 15>>10   Well developed and nourished in no acute distress HENT normal Neck supple with JVP elevated Clear Fast and irregular rate Abd-soft with active BS No Clubbing  cyanosis edema Skin-warm and dry A & Oriented  Grossly normal sensory and motor function   TELEMETRY: Reviewed telemetry pt in afib flutter with more rapid VR *(95-110)   Intake/Output Summary (Last 24 hours) at 11/07/14 0822 Last data filed at 11/07/14 0700  Gross per 24 hour  Intake 1334.4 ml  Output    625 ml  Net  709.4 ml    LABS: Basic Metabolic Panel:  Recent Labs Lab 11/01/14 0440 11/02/14 0400 11/03/14 0400 11/04/14 0922 11/05/14 0300 11/06/14 0435 11/06/14 1405 11/07/14 0455  NA 137 136 134* 132* 134* 137  --  133*  K 4.0 3.9 4.0 4.0 3.8 3.9 4.2 4.5  CL 95* 91* 93* 96* 98* 96*  --  98*  CO2 31 33* 30 27 28 30   --  27  GLUCOSE 92 80 98 160* 102* 92  --  90  BUN 20 26* 26* 21* 21* 24*  --  27*  CREATININE 1.69* 1.91* 1.70* 1.38* 1.37* 1.51*  --  1.51*  CALCIUM 9.6 9.4 9.3 9.0 8.8* 9.1  --  8.9  MG  --   --   --   --   --  2.1  --  2.4   Cardiac Enzymes: No results for input(s): CKTOTAL, CKMB, CKMBINDEX, TROPONINI in the last 72 hours. CBC:  Recent Labs Lab 11/04/14 0423 11/06/14 0740  WBC 7.6 6.3  NEUTROABS  --  4.3  HGB 13.3 11.8*  HCT 41.8 37.7  MCV 88.7 88.1  PLT 188 167   PROTIME:  Recent Labs  11/05/14 0300 11/06/14 0435 11/07/14 0455  LABPROT 25.6* 25.0* 24.8*  INR 2.37* 2.29* 2.27*       ASSESSMENT AND PLAN:  Active Problems:   Biventricular implantable cardioverter-defibrillator -St. Jude   Hypertrophic obstructive cardiomyopathy (HCC)   Chronic kidney disease, stage III (moderate)   Acute on chronic systolic congestive heart failure (HCC)   Cardiogenic shock (HCC)   Persistent atrial fibrillation (HCC)   RBBB plus LA hemiblock    Plan is for DCCV in am on dofetilide she had one INR 1.97 sandwiched between  2.2; i dont think this is an issue  Will decrease DBA a little today in anticipation  Currently not on diuretic Will use 40 bid  Check with CHF re dig  Signed, Virl Axe MD  11/07/2014

## 2014-11-07 NOTE — Progress Notes (Signed)
ANTICOAGULATION CONSULT NOTE - Follow Up Consult  Pharmacy Consult for Coumadin Indication: atrial fibrillation   Patient Measurements: Height: 5\' 3"  (160 cm) Weight: 145 lb 8 oz (65.998 kg) IBW/kg (Calculated) : 52.4  Vital Signs: Temp: 98 F (36.7 C) (10/26 0759) Temp Source: Oral (10/26 0759) BP: 132/103 mmHg (10/26 0759) Pulse Rate: 107 (10/26 0759)  Labs:  Recent Labs  11/05/14 0300 11/06/14 0435 11/06/14 0740 11/07/14 0455  HGB  --   --  11.8*  --   HCT  --   --  37.7  --   PLT  --   --  167  --   LABPROT 25.6* 25.0*  --  24.8*  INR 2.37* 2.29*  --  2.27*  CREATININE 1.37* 1.51*  --  1.51*    Estimated Creatinine Clearance: 27.1 mL/min (by C-G formula based on Cr of 1.51).  Assessment: 82yof continues on coumadin for afib. INR remains therapeutic at 2.27 with increased doses given (requiring more 5mg  doses since off amiodarone and with increased liver perfusion/cardiac output on dobutamine). No bleeding reported. Tikosyn started yesterday. Noted plan for cardioversion tomorrow.  PTA coumadin dose: 5mg  MF / 2.5mg  AOD  Goal of Therapy:  INR 2-3 Monitor platelets by anticoagulation protocol: Yes   Plan:  1) Coumadin 5mg  x 1 tonight 2) INR in AM  Nena Jordan PharmD., BCPS 11/07/2014 11:26 AM

## 2014-11-08 DIAGNOSIS — I421 Obstructive hypertrophic cardiomyopathy: Secondary | ICD-10-CM

## 2014-11-08 LAB — BASIC METABOLIC PANEL
ANION GAP: 8 (ref 5–15)
BUN: 24 mg/dL — ABNORMAL HIGH (ref 6–20)
CHLORIDE: 98 mmol/L — AB (ref 101–111)
CO2: 25 mmol/L (ref 22–32)
Calcium: 8.9 mg/dL (ref 8.9–10.3)
Creatinine, Ser: 1.38 mg/dL — ABNORMAL HIGH (ref 0.44–1.00)
GFR calc non Af Amer: 35 mL/min — ABNORMAL LOW (ref >60)
GFR, EST AFRICAN AMERICAN: 41 mL/min — AB (ref >60)
Glucose, Bld: 107 mg/dL — ABNORMAL HIGH (ref 65–99)
POTASSIUM: 4.5 mmol/L (ref 3.5–5.1)
Sodium: 131 mmol/L — ABNORMAL LOW (ref 135–145)

## 2014-11-08 LAB — CARBOXYHEMOGLOBIN
Carboxyhemoglobin: 1.4 % (ref 0.5–1.5)
Methemoglobin: 0.7 % (ref 0.0–1.5)
O2 SAT: 48.4 %
TOTAL HEMOGLOBIN: 12.1 g/dL (ref 12.0–16.0)

## 2014-11-08 LAB — MAGNESIUM: Magnesium: 2.3 mg/dL (ref 1.7–2.4)

## 2014-11-08 LAB — PROTIME-INR
INR: 2.61 — ABNORMAL HIGH (ref 0.00–1.49)
Prothrombin Time: 27.5 seconds — ABNORMAL HIGH (ref 11.6–15.2)

## 2014-11-08 MED ORDER — FUROSEMIDE 10 MG/ML IJ SOLN
80.0000 mg | Freq: Two times a day (BID) | INTRAMUSCULAR | Status: DC
  Administered 2014-11-08 – 2014-11-10 (×4): 80 mg via INTRAVENOUS
  Filled 2014-11-08 (×4): qty 8

## 2014-11-08 MED ORDER — METOLAZONE 2.5 MG PO TABS
2.5000 mg | ORAL_TABLET | Freq: Once | ORAL | Status: AC
  Administered 2014-11-08: 2.5 mg via ORAL
  Filled 2014-11-08: qty 1

## 2014-11-08 MED ORDER — WARFARIN SODIUM 5 MG PO TABS
5.0000 mg | ORAL_TABLET | Freq: Once | ORAL | Status: AC
  Administered 2014-11-08: 5 mg via ORAL
  Filled 2014-11-08: qty 1

## 2014-11-08 MED ORDER — FUROSEMIDE 10 MG/ML IJ SOLN
80.0000 mg | Freq: Once | INTRAMUSCULAR | Status: AC
  Administered 2014-11-08: 80 mg via INTRAVENOUS

## 2014-11-08 NOTE — Progress Notes (Signed)
ANTICOAGULATION CONSULT NOTE - Follow Up Consult  Pharmacy Consult for Coumadin Indication: atrial fibrillation   Patient Measurements: Height: 5\' 3"  (160 cm) Weight: 146 lb 13.2 oz (66.6 kg) IBW/kg (Calculated) : 52.4  Vital Signs: Temp: 98 F (36.7 C) (10/27 0751) Temp Source: Oral (10/27 0751) BP: 160/91 mmHg (10/27 0751) Pulse Rate: 104 (10/27 0751)  Labs:  Recent Labs  11/06/14 0435 11/06/14 0740 11/07/14 0455 11/08/14 0233  HGB  --  11.8*  --   --   HCT  --  37.7  --   --   PLT  --  167  --   --   LABPROT 25.0*  --  24.8* 27.5*  INR 2.29*  --  2.27* 2.61*  CREATININE 1.51*  --  1.51* 1.38*    Estimated Creatinine Clearance: 29.8 mL/min (by C-G formula based on Cr of 1.38).  Assessment: 82yof continues on coumadin for afib.   INR therapeutic at 2.61, has been requiring more 5 mg doses since off amiodarone and with increased liver perfusion/cardiac output on dobutamine. No bleeding noted. Tikosyn started 10/25. Plan to continue diuresis today for cardioversion tomorrow.  PTA coumadin dose: 5mg  MF / 2.5mg  AOD  Goal of Therapy:  INR 2-3 Monitor platelets by anticoagulation protocol: Yes   Plan:  Warfarin 5mg  x 1 tonight Daily INR Monitor s/sx bleeding    Heloise Ochoa, Pharm.D. PGY2 Cardiology Pharmacy Resident Pager: [phone]  11/08/2014 10:45 AM

## 2014-11-08 NOTE — Progress Notes (Signed)
Advanced Heart Failure Rounding Note   Subjective:     Restarted on tikosyn 10/24.  Yesterday she was diuresed with 40 mg IV lasix. Sluggish urine output. Weight up 1 pound.   Feels bad. Complaining of headache.    CO-OX 48% INR 2.6  Objective:   Weight Range:  Vital Signs:   Temp:  [97.7 F (36.5 C)-98.2 F (36.8 C)] 98 F (36.7 C) (10/27 0751) Pulse Rate:  [93-116] 104 (10/27 0751) Resp:  [16-18] 18 (10/27 0751) BP: (119-160)/(80-100) 160/91 mmHg (10/27 0751) SpO2:  [94 %-100 %] 97 % (10/27 0751) Weight:  [146 lb 13.2 oz (66.6 kg)] 146 lb 13.2 oz (66.6 kg) (10/27 0420) Last BM Date: 11/06/14  Weight change: Filed Weights   11/06/14 0300 11/07/14 0441 11/08/14 0420  Weight: 137 lb 2 oz (62.2 kg) 145 lb 8 oz (65.998 kg) 146 lb 13.2 oz (66.6 kg)    Intake/Output:   Intake/Output Summary (Last 24 hours) at 11/08/14 0803 Last data filed at 11/08/14 0600  Gross per 24 hour  Intake 1044.9 ml  Output   1650 ml  Net -605.1 ml     Physical Exam: CVP 18 GEN- NAD . In bed.  HEENT: normal Neck.  Supple. JVP jaw    Lungs- Clear to ausculation bilaterally, normal work of breathing. No wheezes, rales, rhonchi Heart- PMI laterally displaced. Irregular GI- soft, non-tender, non-distended, bowel sounds present  Extremities- no clubbing, cyanosis, trace edema; DP/PT/radial pulses 1+ bilaterally MS- no significant deformity or atrophy Skin- warm and dry, no rash or lesion; ICD pocket well healed Neuro- Alert and oriented x3.   Telemetry:  Afib 100s    Labs: Basic Metabolic Panel:  Recent Labs Lab 11/04/14 0922 11/05/14 0300 11/06/14 0435 11/06/14 1405 11/07/14 0455 11/08/14 0233  NA 132* 134* 137  --  133* 131*  K 4.0 3.8 3.9 4.2 4.5 4.5  CL 96* 98* 96*  --  98* 98*  CO2 27 28 30   --  27 25  GLUCOSE 160* 102* 92  --  90 107*  BUN 21* 21* 24*  --  27* 24*  CREATININE 1.38* 1.37* 1.51*  --  1.51* 1.38*  CALCIUM 9.0 8.8* 9.1  --  8.9 8.9  MG  --   --   2.1  --  2.4 2.3    Liver Function Tests: No results for input(s): AST, ALT, ALKPHOS, BILITOT, PROT, ALBUMIN in the last 168 hours. No results for input(s): LIPASE, AMYLASE in the last 168 hours. No results for input(s): AMMONIA in the last 168 hours.  CBC:  Recent Labs Lab 11/04/14 0423 11/06/14 0740  WBC 7.6 6.3  NEUTROABS  --  4.3  HGB 13.3 11.8*  HCT 41.8 37.7  MCV 88.7 88.1  PLT 188 167    Cardiac Enzymes: No results for input(s): CKTOTAL, CKMB, CKMBINDEX, TROPONINI in the last 168 hours.  BNP: BNP (last 3 results) No results for input(s): BNP in the last 8760 hours.  ProBNP (last 3 results) No results for input(s): PROBNP in the last 8760 hours.    Other results:  Imaging: No results found.   Medications:     Scheduled Medications: . dofetilide  250 mcg Oral Q12H  . ezetimibe  10 mg Oral Q M,W,F  . fluticasone  2 spray Each Nare Daily  . furosemide  40 mg Intravenous BID  . levothyroxine  100 mcg Oral Q breakfast  . polyethylene glycol  17 g Oral Daily  . potassium  chloride  20 mEq Oral Daily  . sodium chloride  10-40 mL Intracatheter Q12H  . sodium chloride  3 mL Intravenous Q12H  . Warfarin - Pharmacist Dosing Inpatient   Does not apply q1800    Infusions: . sodium chloride 10 mL/hr at 11/08/14 0245  . sodium chloride    . DOBUTamine 3 mcg/kg/min (11/07/14 1000)    PRN Medications: acetaminophen, albuterol, ALPRAZolam, fentaNYL (SUBLIMAZE) injection, ondansetron (ZOFRAN) IV, sodium chloride, sodium chloride, traMADol, zolpidem   Assessment:   1. Acute on chronic systolic HF due to NICM EF 25% 2. Cardiogenic shock 3. PAF   - Tikosyn stopped recently due to worsening CKD   - unable to tolerate amio due to n/v 4. CKD, stage 4 5. Hypothyroidism  Plan/Discussion:    Increase dobutamine 4 mcg/kg/min. CO-OX today 48%.  Volume status elevated. Increase lasix 80 mg twice daily.  Renal function stable.   Completed ceftriaxone for UTI.  Urine culture negative.    A fib . INR 2.6 . Started back on tikosyn. For DC-CV maybe tomorrow if volume status improve. .    Length of Stay: [place] NP-C  11/08/2014, 8:03 AM Advanced Heart Failure Team Pager [phone] (M-F; 7a - 4p)  Please contact [facility] for night-coverage after hours (4p -7a ) and weekends on [email or web address]  Patient seen and examined with Darrick Grinder, NP. We discussed all aspects of the encounter. I agree with the assessment and plan as stated above.   She is shocky again today. Nauseated. Diaphoretic. Co-ox down to 48%. CVP up to 19. Will increase dobutamine to 4. Increase lasix to 80 bid and give metolazone 2.5. Defer DC-CV at least 1 more day until more stable. Continue tikosyn.   D/w Dr. Caryl Comes  The patient is critically ill with multiple organ systems failure and requires high complexity decision making for assessment and support, frequent evaluation and titration of therapies, application of advanced monitoring technologies and extensive interpretation of multiple databases.   Critical Care Time devoted to patient care services described in this note is 35 Minutes.  Lindsay Straka,MD 8:31 AM

## 2014-11-09 ENCOUNTER — Encounter (HOSPITAL_COMMUNITY): Payer: Self-pay | Admitting: Anesthesiology

## 2014-11-09 ENCOUNTER — Encounter (HOSPITAL_COMMUNITY): Payer: Self-pay | Admitting: *Deleted

## 2014-11-09 ENCOUNTER — Inpatient Hospital Stay (HOSPITAL_COMMUNITY): Payer: Medicare Other | Admitting: Anesthesiology

## 2014-11-09 ENCOUNTER — Encounter (HOSPITAL_COMMUNITY)
Admission: RE | Disposition: A | Discharge status: Home Health | Payer: Self-pay | Source: Ambulatory Visit | Attending: Internal Medicine

## 2014-11-09 DIAGNOSIS — Z95810 Presence of automatic (implantable) cardiac defibrillator: Secondary | ICD-10-CM

## 2014-11-09 HISTORY — PX: CARDIOVERSION: SHX1299

## 2014-11-09 LAB — BASIC METABOLIC PANEL
Anion gap: 11 (ref 5–15)
BUN: 25 mg/dL — AB (ref 6–20)
CO2: 28 mmol/L (ref 22–32)
CREATININE: 1.52 mg/dL — AB (ref 0.44–1.00)
Calcium: 9.5 mg/dL (ref 8.9–10.3)
Chloride: 92 mmol/L — ABNORMAL LOW (ref 101–111)
GFR calc Af Amer: 36 mL/min — ABNORMAL LOW (ref >60)
GFR, EST NON AFRICAN AMERICAN: 31 mL/min — AB (ref >60)
GLUCOSE: 100 mg/dL — AB (ref 65–99)
POTASSIUM: 3.6 mmol/L (ref 3.5–5.1)
Sodium: 131 mmol/L — ABNORMAL LOW (ref 135–145)

## 2014-11-09 LAB — MAGNESIUM: MAGNESIUM: 2 mg/dL (ref 1.7–2.4)

## 2014-11-09 LAB — PROTIME-INR
INR: 2.98 — AB (ref 0.00–1.49)
PROTHROMBIN TIME: 30.5 s — AB (ref 11.6–15.2)

## 2014-11-09 LAB — CARBOXYHEMOGLOBIN
Carboxyhemoglobin: 1.1 % (ref 0.5–1.5)
METHEMOGLOBIN: 0.8 % (ref 0.0–1.5)
O2 Saturation: 45.4 %
Total hemoglobin: 12.5 g/dL (ref 12.0–16.0)

## 2014-11-09 SURGERY — CARDIOVERSION
Anesthesia: General

## 2014-11-09 MED ORDER — WARFARIN SODIUM 2.5 MG PO TABS
2.5000 mg | ORAL_TABLET | Freq: Once | ORAL | Status: AC
  Administered 2014-11-09: 2.5 mg via ORAL
  Filled 2014-11-09: qty 1

## 2014-11-09 MED ORDER — PROPOFOL 10 MG/ML IV BOLUS
INTRAVENOUS | Status: DC | PRN
  Administered 2014-11-09: 50 mg via INTRAVENOUS

## 2014-11-09 MED ORDER — SODIUM CHLORIDE 0.9 % IJ SOLN
3.0000 mL | Freq: Two times a day (BID) | INTRAMUSCULAR | Status: DC
  Administered 2014-11-11: 3 mL via INTRAVENOUS

## 2014-11-09 MED ORDER — LIDOCAINE HCL (CARDIAC) 20 MG/ML IV SOLN
INTRAVENOUS | Status: DC | PRN
  Administered 2014-11-09: 40 mg via INTRAVENOUS

## 2014-11-09 MED ORDER — POTASSIUM CHLORIDE CRYS ER 20 MEQ PO TBCR
40.0000 meq | EXTENDED_RELEASE_TABLET | Freq: Once | ORAL | Status: AC
  Administered 2014-11-09: 40 meq via ORAL
  Filled 2014-11-09: qty 2

## 2014-11-09 MED ORDER — SODIUM CHLORIDE 0.9 % IV SOLN: 250.0000 mL | INTRAVENOUS | Status: DC

## 2014-11-09 MED ORDER — SODIUM CHLORIDE 0.9 % IJ SOLN: 3.0000 mL | INTRAMUSCULAR | Status: DC | PRN

## 2014-11-09 MED ORDER — RANOLAZINE ER 500 MG PO TB12
500.0000 mg | ORAL_TABLET | Freq: Two times a day (BID) | ORAL | Status: DC
  Administered 2014-11-09 – 2014-11-13 (×8): 500 mg via ORAL
  Filled 2014-11-09 (×8): qty 1

## 2014-11-09 MED ORDER — PHENYLEPHRINE HCL 10 MG/ML IJ SOLN
INTRAMUSCULAR | Status: DC | PRN
  Administered 2014-11-09: 80 ug via INTRAVENOUS

## 2014-11-09 NOTE — Care Management Important Message (Signed)
Important Message  Patient Details  Name: Sandra [name] MRN: [id number] Date of Birth: [birth year]   Medicare Important Message Given:  Yes-fourth notification given    Delorse Lek 11/09/2014, 12:55 PM

## 2014-11-09 NOTE — Anesthesia Preprocedure Evaluation (Addendum)
Anesthesia Evaluation  Patient identified by MRN, date of birth, ID band Patient awake    Reviewed: Allergy & Precautions, NPO status , Patient's Chart, lab work & pertinent test results  Airway Mallampati: II  TM Distance: >3 FB Neck ROM: Full    Dental no notable dental hx. (+) Teeth Intact, Dental Advisory Given   Pulmonary shortness of breath, former smoker,    Pulmonary exam normal breath sounds clear to auscultation       Cardiovascular hypertension, Pt. on medications +CHF  Normal cardiovascular exam+ dysrhythmias + Cardiac Defibrillator  Rhythm:Regular Rate:Normal     Neuro/Psych  Headaches, PSYCHIATRIC DISORDERS Anxiety    GI/Hepatic negative GI ROS, Neg liver ROS,   Endo/Other  negative endocrine ROSHypothyroidism   Renal/GU Renal disease     Musculoskeletal negative musculoskeletal ROS (+)   Abdominal   Peds  Hematology negative hematology ROS (+)   Anesthesia Other Findings   Reproductive/Obstetrics negative OB ROS                           Anesthesia Physical Anesthesia Plan  ASA: IV  Anesthesia Plan: MAC   Post-op Pain Management:    Induction: Intravenous  Airway Management Planned: Natural Airway  Additional Equipment:   Intra-op Plan:   Post-operative Plan:   Informed Consent: I have reviewed the patients History and Physical, chart, labs and discussed the procedure including the risks, benefits and alternatives for the proposed anesthesia with the patient or authorized representative who has indicated his/her understanding and acceptance.   Dental advisory given  Plan Discussed with: CRNA  Anesthesia Plan Comments: (Systolic EF 35% is chronic with AICD, in afib, CKD  Will give prop slowly, low dose to cause amnesia, supplement BP with phenylephrine)      Anesthesia Quick Evaluation

## 2014-11-09 NOTE — Interval H&P Note (Signed)
History and Physical Interval Note:  11/09/2014 11:04 AM  Sandra [name]  has presented today for surgery, with the diagnosis of A. Fib  The various methods of treatment have been discussed with the patient and family. After consideration of risks, benefits and other options for treatment, the patient has consented to  Procedure(s): CARDIOVERSION (N/A) as a surgical intervention .  The patient's history has been reviewed, patient examined, no change in status, stable for surgery.  I have reviewed the patient's chart and labs.  Questions were answered to the patient's satisfaction.     Ronee Ranganathan, Quillian Quince

## 2014-11-09 NOTE — Transfer of Care (Signed)
Immediate Anesthesia Transfer of Care Note  Patient: Sandra [name]  Procedure(s) Performed: Procedure(s): CARDIOVERSION (N/A)  Patient Location: Endoscopy Unit  Anesthesia Type:MAC  Level of Consciousness: awake, alert  and oriented  Airway & Oxygen Therapy: Patient Spontanous Breathing and Patient connected to nasal cannula oxygen  Post-op Assessment: Report given to RN, Post -op Vital signs reviewed and stable and Patient moving all extremities  Post vital signs: Reviewed and stable  Last Vitals:  Filed Vitals:   11/09/14 1054  BP: 155/97  Pulse: 99  Temp: 36.5 C  Resp: 21    Complications: No apparent anesthesia complications

## 2014-11-09 NOTE — Progress Notes (Signed)
Patient Name: Sandra [name]      SUBJECTIVE:feels a little better today  With dry mouth  Past Medical History  Diagnosis Date  . Atrial fibrillation ([place]) permanent     on Tikosyn and Coumadin  . Hyperlipidemia   . Hypothyroidism   . Bradycardia   . Cardiomyopathy, hypertrophic nonobstructive (HCC)     ejection fraction 25%  . Anal fissure   . Adenomatous colon polyp   . Diverticulosis   . ICD-CRT     generator change 2013  . Systolic heart failure   . History of colonoscopy   . Migraines   . Hyperlipidemia   . Hypertension   . Chronic renal insufficiency, stage III (moderate)     gd 3-4  . RBBB plus LA hemiblock     Scheduled Meds:  Scheduled Meds: . dofetilide  250 mcg Oral Q12H  . ezetimibe  10 mg Oral Q M,W,F  . fluticasone  2 spray Each Nare Daily  . furosemide  80 mg Intravenous BID  . levothyroxine  100 mcg Oral Q breakfast  . polyethylene glycol  17 g Oral Daily  . potassium chloride  20 mEq Oral Daily  . sodium chloride  10-40 mL Intracatheter Q12H  . sodium chloride  3 mL Intravenous Q12H  . sodium chloride  3 mL Intravenous Q12H  . warfarin  2.5 mg Oral ONCE-1800  . Warfarin - Pharmacist Dosing Inpatient   Does not apply q1800   Continuous Infusions: . sodium chloride 10 mL/hr at 11/08/14 0245  . sodium chloride    . sodium chloride    . DOBUTamine 4 mcg/kg/min (11/08/14 0820)   acetaminophen, albuterol, ALPRAZolam, fentaNYL (SUBLIMAZE) injection, ondansetron (ZOFRAN) IV, sodium chloride, sodium chloride, sodium chloride, traMADol, zolpidem    PHYSICAL EXAM Filed Vitals:   11/08/14 2347 11/09/14 0313 11/09/14 0316 11/09/14 0735  BP: 136/83  144/85 112/72  Pulse:      Temp: 97.3 F (36.3 C)  97.4 F (36.3 C) 97.6 F (36.4 C)  TempSrc: Oral  Oral Oral  Resp: 18   18  Height:      Weight:  144 lb 9.6 oz (65.59 kg)    SpO2: 94%  97% 96%    General appearance: alert and mild distress Neck: JVD - 10 cm above sternal notch,  no carotid bruit and thyroid not enlarged, symmetric, no tenderness/mass/nodules Lungs: clear to auscultation bilaterally Heart: irregularly irregular rhythm Abdomen: soft, non-tender; bowel sounds normal; no masses,  no organomegaly Extremities: no edema, redness or tenderness in the calves or thighs Skin: Skin color, texture, turgor normal. No rashes or lesions Neurologic: Grossly normal  TELEMETRY: Reviewed telemetry pt in afib: rates a little slower    Intake/Output Summary (Last 24 hours) at 11/09/14 1000 Last data filed at 11/09/14 0700  Gross per 24 hour  Intake    994 ml  Output   1950 ml  Net   -956 ml    LABS: Basic Metabolic Panel:  Recent Labs Lab 11/03/14 0400 11/04/14 0922 11/05/14 0300  11/06/14 0435 11/06/14 1405 11/07/14 0455 11/08/14 0233 11/09/14 0320  NA 134* 132* 134*  --  137  --  133* 131* 131*  K 4.0 4.0 3.8  --  3.9 4.2 4.5 4.5 3.6  CL 93* 96* 98*  --  96*  --  98* 98* 92*  CO2 30 27 28   --  30  --  27 25 28   GLUCOSE 98 160* 102*  --  92  --  90 107* 100*  BUN 26* 21* 21*  --  24*  --  27* 24* 25*  CREATININE 1.70* 1.38* 1.37*  --  1.51*  --  1.51* 1.38* 1.52*  CALCIUM 9.3 9.0 8.8*  --  9.1  --  8.9 8.9 9.5  MG  --   --   --   < > 2.1  --  2.4 2.3 2.0  < > = values in this interval not displayed. Cardiac Enzymes: No results for input(s): CKTOTAL, CKMB, CKMBINDEX, TROPONINI in the last 72 hours. CBC:  Recent Labs Lab 11/04/14 0423 11/06/14 0740  WBC 7.6 6.3  NEUTROABS  --  4.3  HGB 13.3 11.8*  HCT 41.8 37.7  MCV 88.7 88.1  PLT 188 167   PROTIME:  Recent Labs  11/07/14 0455 11/08/14 0233 11/09/14 0320  LABPROT 24.8* 27.5* 30.5*  INR 2.27* 2.61* 2.98*   Liver Function Tests: No results for input(s): AST, ALT, ALKPHOS, BILITOT, PROT, ALBUMIN in the last 72 hours. No results for input(s): LIPASE, AMYLASE in the last 72 hours. BNP: BNP (last 3 results) No results for input(s): BNP in the last 8760 hours.  ProBNP (last 3  results) No results for input(s): PROBNP in the last 8760 hours.      ASSESSMENT AND PLAN:  Active Problems:   Biventricular implantable cardioverter-defibrillator -[employer] Jude   Hypertrophic obstructive cardiomyopathy (HCC)   Chronic kidney disease, stage III (moderate)   Acute on chronic systolic congestive heart failure (HCC)   Cardiogenic shock (HCC)   Persistent atrial fibrillation (HCC)   RBBB plus LA hemiblock  For cardioversion today Continue current meds DBA per DB Renal function a little worse wil have to keep eye on dofetilide dosing ( already above recommended dose but with ICD )   Signed, Virl Axe MD  11/09/2014

## 2014-11-09 NOTE — H&P (View-Only) (Signed)
Patient Name: Sandra [name]      SUBJECTIVE:feels a little better today  With dry mouth  Past Medical History  Diagnosis Date  . Atrial fibrillation ([place]) permanent     on Tikosyn and Coumadin  . Hyperlipidemia   . Hypothyroidism   . Bradycardia   . Cardiomyopathy, hypertrophic nonobstructive (HCC)     ejection fraction 25%  . Anal fissure   . Adenomatous colon polyp   . Diverticulosis   . ICD-CRT     generator change 2013  . Systolic heart failure   . History of colonoscopy   . Migraines   . Hyperlipidemia   . Hypertension   . Chronic renal insufficiency, stage III (moderate)     gd 3-4  . RBBB plus LA hemiblock     Scheduled Meds:  Scheduled Meds: . dofetilide  250 mcg Oral Q12H  . ezetimibe  10 mg Oral Q M,W,F  . fluticasone  2 spray Each Nare Daily  . furosemide  80 mg Intravenous BID  . levothyroxine  100 mcg Oral Q breakfast  . polyethylene glycol  17 g Oral Daily  . potassium chloride  20 mEq Oral Daily  . sodium chloride  10-40 mL Intracatheter Q12H  . sodium chloride  3 mL Intravenous Q12H  . sodium chloride  3 mL Intravenous Q12H  . warfarin  2.5 mg Oral ONCE-1800  . Warfarin - Pharmacist Dosing Inpatient   Does not apply q1800   Continuous Infusions: . sodium chloride 10 mL/hr at 11/08/14 0245  . sodium chloride    . sodium chloride    . DOBUTamine 4 mcg/kg/min (11/08/14 0820)   acetaminophen, albuterol, ALPRAZolam, fentaNYL (SUBLIMAZE) injection, ondansetron (ZOFRAN) IV, sodium chloride, sodium chloride, sodium chloride, traMADol, zolpidem    PHYSICAL EXAM Filed Vitals:   11/08/14 2347 11/09/14 0313 11/09/14 0316 11/09/14 0735  BP: 136/83  144/85 112/72  Pulse:      Temp: 97.3 F (36.3 C)  97.4 F (36.3 C) 97.6 F (36.4 C)  TempSrc: Oral  Oral Oral  Resp: 18   18  Height:      Weight:  144 lb 9.6 oz (65.59 kg)    SpO2: 94%  97% 96%    General appearance: alert and mild distress Neck: JVD - 10 cm above sternal notch,  no carotid bruit and thyroid not enlarged, symmetric, no tenderness/mass/nodules Lungs: clear to auscultation bilaterally Heart: irregularly irregular rhythm Abdomen: soft, non-tender; bowel sounds normal; no masses,  no organomegaly Extremities: no edema, redness or tenderness in the calves or thighs Skin: Skin color, texture, turgor normal. No rashes or lesions Neurologic: Grossly normal  TELEMETRY: Reviewed telemetry pt in afib: rates a little slower    Intake/Output Summary (Last 24 hours) at 11/09/14 1000 Last data filed at 11/09/14 0700  Gross per 24 hour  Intake    994 ml  Output   1950 ml  Net   -956 ml    LABS: Basic Metabolic Panel:  Recent Labs Lab 11/03/14 0400 11/04/14 0922 11/05/14 0300  11/06/14 0435 11/06/14 1405 11/07/14 0455 11/08/14 0233 11/09/14 0320  NA 134* 132* 134*  --  137  --  133* 131* 131*  K 4.0 4.0 3.8  --  3.9 4.2 4.5 4.5 3.6  CL 93* 96* 98*  --  96*  --  98* 98* 92*  CO2 30 27 28   --  30  --  27 25 28   GLUCOSE 98 160* 102*  --  92  --  90 107* 100*  BUN 26* 21* 21*  --  24*  --  27* 24* 25*  CREATININE 1.70* 1.38* 1.37*  --  1.51*  --  1.51* 1.38* 1.52*  CALCIUM 9.3 9.0 8.8*  --  9.1  --  8.9 8.9 9.5  MG  --   --   --   < > 2.1  --  2.4 2.3 2.0  < > = values in this interval not displayed. Cardiac Enzymes: No results for input(s): CKTOTAL, CKMB, CKMBINDEX, TROPONINI in the last 72 hours. CBC:  Recent Labs Lab 11/04/14 0423 11/06/14 0740  WBC 7.6 6.3  NEUTROABS  --  4.3  HGB 13.3 11.8*  HCT 41.8 37.7  MCV 88.7 88.1  PLT 188 167   PROTIME:  Recent Labs  11/07/14 0455 11/08/14 0233 11/09/14 0320  LABPROT 24.8* 27.5* 30.5*  INR 2.27* 2.61* 2.98*   Liver Function Tests: No results for input(s): AST, ALT, ALKPHOS, BILITOT, PROT, ALBUMIN in the last 72 hours. No results for input(s): LIPASE, AMYLASE in the last 72 hours. BNP: BNP (last 3 results) No results for input(s): BNP in the last 8760 hours.  ProBNP (last 3  results) No results for input(s): PROBNP in the last 8760 hours.      ASSESSMENT AND PLAN:  Active Problems:   Biventricular implantable cardioverter-defibrillator -[employer]. Jude   Hypertrophic obstructive cardiomyopathy (HCC)   Chronic kidney disease, stage III (moderate)   Acute on chronic systolic congestive heart failure (HCC)   Cardiogenic shock (HCC)   Persistent atrial fibrillation (HCC)   RBBB plus LA hemiblock  For cardioversion today Continue current meds DBA per DB Renal function a little worse wil have to keep eye on dofetilide dosing ( already above recommended dose but with ICD )   Signed, Virl Axe MD  11/09/2014

## 2014-11-09 NOTE — Progress Notes (Signed)
ANTICOAGULATION CONSULT NOTE - Follow Up Consult  Pharmacy Consult for Coumadin Indication: atrial fibrillation   Patient Measurements: Height: 5\' 3"  (160 cm) Weight: 144 lb 9.6 oz (65.59 kg) IBW/kg (Calculated) : 52.4  Vital Signs: Temp: 97.6 F (36.4 C) (10/28 0735) Temp Source: Oral (10/28 0735) BP: 112/72 mmHg (10/28 0735)  Labs:  Recent Labs  11/07/14 0455 11/08/14 0233 11/09/14 0320  LABPROT 24.8* 27.5* 30.5*  INR 2.27* 2.61* 2.98*  CREATININE 1.51* 1.38* 1.52*    Estimated Creatinine Clearance: 26.9 mL/min (by C-G formula based on Cr of 1.52).  Assessment: 82yof continues on coumadin for afib.   INR therapeutic at 2.98. Pt has been requiring more 5 mg doses since off amiodarone and with increased liver perfusion/cardiac output on dobutamine, however INR increasing by 0.3 x 2 days. No bleeding noted. Tikosyn started 10/25. Plan for cardioversion today.  PTA coumadin dose: 5mg  MF / 2.5mg  AOD  Goal of Therapy:  INR 2-3 Monitor platelets by anticoagulation protocol: Yes   Plan:  Warfarin 2.5mg  x 1 tonight Daily INR Monitor s/sx bleeding    Heloise Ochoa, Pharm.D. PGY2 Cardiology Pharmacy Resident Pager: [phone]  11/09/2014 8:22 AM

## 2014-11-09 NOTE — Progress Notes (Signed)
Late entry        Patient Name: Sandra [name]      SUBJECTIVE uncompfortable with some SOB and headache with vague chest discomfort  Past Medical History  Diagnosis Date  . Atrial fibrillation ([place]) permanent     on Tikosyn and Coumadin  . Hyperlipidemia   . Hypothyroidism   . Bradycardia   . Cardiomyopathy, hypertrophic nonobstructive (HCC)     ejection fraction 25%  . Anal fissure   . Adenomatous colon polyp   . Diverticulosis   . ICD-CRT     generator change 2013  . Systolic heart failure   . History of colonoscopy   . Migraines   . Hyperlipidemia   . Hypertension   . Chronic renal insufficiency, stage III (moderate)     gd 3-4  . RBBB plus LA hemiblock     Scheduled Meds:  Scheduled Meds: . dofetilide  250 mcg Oral Q12H  . ezetimibe  10 mg Oral Q M,W,F  . fluticasone  2 spray Each Nare Daily  . furosemide  80 mg Intravenous BID  . levothyroxine  100 mcg Oral Q breakfast  . polyethylene glycol  17 g Oral Daily  . potassium chloride  20 mEq Oral Daily  . sodium chloride  10-40 mL Intracatheter Q12H  . sodium chloride  3 mL Intravenous Q12H  . sodium chloride  3 mL Intravenous Q12H  . warfarin  2.5 mg Oral ONCE-1800  . Warfarin - Pharmacist Dosing Inpatient   Does not apply q1800   Continuous Infusions: . sodium chloride 10 mL/hr at 11/08/14 0245  . sodium chloride    . sodium chloride    . DOBUTamine 4 mcg/kg/min (11/08/14 0820)   acetaminophen, albuterol, ALPRAZolam, fentaNYL (SUBLIMAZE) injection, ondansetron (ZOFRAN) IV, sodium chloride, sodium chloride, sodium chloride, traMADol, zolpidem    PHYSICAL EXAM Filed Vitals:   11/08/14 2347 11/09/14 0313 11/09/14 0316 11/09/14 0735  BP: 136/83  144/85 112/72  Pulse:      Temp: 97.3 F (36.3 C)  97.4 F (36.3 C) 97.6 F (36.4 C)  TempSrc: Oral  Oral Oral  Resp: 18   18  Height:      Weight:  144 lb 9.6 oz (65.59 kg)    SpO2: 94%  97% 96%  Well developed and nourished in mod res  distress HENT normal Neck supple with JVP>10 Carotids brisk and full without bruits Clear Irregularly irregular rate and rhythm with rapid ventricular response, no murmurs or gallops Abd-soft with active BS without hepatomegaly No Clubbing cyanosis edema Skin-warm and dry A & Oriented  Grossly normal sensory and motor function  TELEMETRY: Reviewed telemetry pt in *afib rVR   Intake/Output Summary (Last 24 hours) at 11/09/14 0957 Last data filed at 11/09/14 0700  Gross per 24 hour  Intake   1128 ml  Output   2350 ml  Net  -1222 ml    LABS: Basic Metabolic Panel:  Recent Labs Lab 11/03/14 0400 11/04/14 0922 11/05/14 0300  11/06/14 0435 11/06/14 1405 11/07/14 0455 11/08/14 0233 11/09/14 0320  NA 134* 132* 134*  --  137  --  133* 131* 131*  K 4.0 4.0 3.8  --  3.9 4.2 4.5 4.5 3.6  CL 93* 96* 98*  --  96*  --  98* 98* 92*  CO2 30 27 28   --  30  --  27 25 28   GLUCOSE 98 160* 102*  --  92  --  90 107* 100*  BUN 26*  21* 21*  --  24*  --  27* 24* 25*  CREATININE 1.70* 1.38* 1.37*  --  1.51*  --  1.51* 1.38* 1.52*  CALCIUM 9.3 9.0 8.8*  --  9.1  --  8.9 8.9 9.5  MG  --   --   --   < > 2.1  --  2.4 2.3 2.0  < > = values in this interval not displayed. Cardiac Enzymes: No results for input(s): CKTOTAL, CKMB, CKMBINDEX, TROPONINI in the last 72 hours. CBC:  Recent Labs Lab 11/04/14 0423 11/06/14 0740  WBC 7.6 6.3  NEUTROABS  --  4.3  HGB 13.3 11.8*  HCT 41.8 37.7  MCV 88.7 88.1  PLT 188 167   PROTIME:  Recent Labs  11/07/14 0455 11/08/14 0233 11/09/14 0320  LABPROT 24.8* 27.5* 30.5*  INR 2.27* 2.61* 2.98*   Liver Function Tests: No results for input(s): AST, ALT, ALKPHOS, BILITOT, PROT, ALBUMIN in the last 72 hours. No results for input(s): LIPASE, AMYLASE in the last 72 hours. BNP: BNP (last 3 results) No results for input(s): BNP in the last 8760 hours.  ProBNP (last 3 results) No results for input(s): PROBNP in the last 8760 hours.     ASSESSMENT  AND PLAN:  Active Problems:   Biventricular implantable cardioverter-defibrillator -[employer]. Jude   Hypertrophic obstructive cardiomyopathy (HCC)   Chronic kidney disease, stage III (moderate)   Acute on chronic systolic congestive heart failure (HCC)   Cardiogenic shock (HCC)   Persistent atrial fibrillation (HCC)   RBBB plus LA hemiblock  Seen yesterday with Dr Reine Just  Decided to put off cardioversion 2/2 elevated CVP and decreaes coox  DBA and diuretics to be increased  Signed, Virl Axe MD  11/09/2014

## 2014-11-09 NOTE — Progress Notes (Signed)
Advanced Heart Failure Rounding Note   Subjective:   Had been on milrinone earlier this admit but had increased ectopy.  Restarted on tikosyn 10/24.  Yesterday she was diuresed with 80 mg twice daily. Increased urine output.   Over night complaining of headache and nausea.    CO-OX 45%  Creatinine 1.38>1.5 INR 2.98 Objective:   Weight Range:  Vital Signs:   Temp:  [97.3 F (36.3 C)-98 F (36.7 C)] 97.4 F (36.3 C) (10/28 0316) Pulse Rate:  [91-104] 91 (10/27 1658) Resp:  [16-20] 18 (10/27 2347) BP: (132-160)/(83-107) 144/85 mmHg (10/28 0316) SpO2:  [94 %-97 %] 97 % (10/28 0316) Weight:  [144 lb 9.6 oz (65.59 kg)] 144 lb 9.6 oz (65.59 kg) (10/28 0313) Last BM Date: 11/06/14  Weight change: Filed Weights   11/07/14 0441 11/08/14 0420 11/09/14 0313  Weight: 145 lb 8 oz (65.998 kg) 146 lb 13.2 oz (66.6 kg) 144 lb 9.6 oz (65.59 kg)    Intake/Output:   Intake/Output Summary (Last 24 hours) at 11/09/14 0733 Last data filed at 11/09/14 0500  Gross per 24 hour  Intake 1126.67 ml  Output   2500 ml  Net -1373.33 ml     Physical Exam: CVP 17 GEN- NAD . In bed.  HEENT: normal Neck.  Supple. JVP jaw    Lungs- Clear to ausculation bilaterally, normal work of breathing. No wheezes, rales, rhonchi Heart- PMI laterally displaced. Irregular GI- soft, non-tender, non-distended, bowel sounds present  Extremities- no clubbing, cyanosis, trace edema; DP/PT/radial pulses 1+ bilaterally MS- no significant deformity or atrophy Skin- warm and dry, no rash or lesion; ICD pocket well healed Neuro- Alert and oriented x3.   Telemetry:  Afib 100s    Labs: Basic Metabolic Panel:  Recent Labs Lab 11/05/14 0300 11/06/14 0435 11/06/14 1405 11/07/14 0455 11/08/14 0233 11/09/14 0320  NA 134* 137  --  133* 131* 131*  K 3.8 3.9 4.2 4.5 4.5 3.6  CL 98* 96*  --  98* 98* 92*  CO2 28 30  --  27 25 28   GLUCOSE 102* 92  --  90 107* 100*  BUN 21* 24*  --  27* 24* 25*  CREATININE  1.37* 1.51*  --  1.51* 1.38* 1.52*  CALCIUM 8.8* 9.1  --  8.9 8.9 9.5  MG  --  2.1  --  2.4 2.3 2.0    Liver Function Tests: No results for input(s): AST, ALT, ALKPHOS, BILITOT, PROT, ALBUMIN in the last 168 hours. No results for input(s): LIPASE, AMYLASE in the last 168 hours. No results for input(s): AMMONIA in the last 168 hours.  CBC:  Recent Labs Lab 11/04/14 0423 11/06/14 0740  WBC 7.6 6.3  NEUTROABS  --  4.3  HGB 13.3 11.8*  HCT 41.8 37.7  MCV 88.7 88.1  PLT 188 167    Cardiac Enzymes: No results for input(s): CKTOTAL, CKMB, CKMBINDEX, TROPONINI in the last 168 hours.  BNP: BNP (last 3 results) No results for input(s): BNP in the last 8760 hours.  ProBNP (last 3 results) No results for input(s): PROBNP in the last 8760 hours.    Other results:  Imaging: No results found.   Medications:     Scheduled Medications: . dofetilide  250 mcg Oral Q12H  . ezetimibe  10 mg Oral Q M,W,F  . fluticasone  2 spray Each Nare Daily  . furosemide  80 mg Intravenous BID  . levothyroxine  100 mcg Oral Q breakfast  . polyethylene glycol  17 g Oral Daily  . potassium chloride  20 mEq Oral Daily  . sodium chloride  10-40 mL Intracatheter Q12H  . sodium chloride  3 mL Intravenous Q12H  . Warfarin - Pharmacist Dosing Inpatient   Does not apply q1800    Infusions: . sodium chloride 10 mL/hr at 11/08/14 0245  . sodium chloride    . DOBUTamine 4 mcg/kg/min (11/08/14 0820)    PRN Medications: acetaminophen, albuterol, ALPRAZolam, fentaNYL (SUBLIMAZE) injection, ondansetron (ZOFRAN) IV, sodium chloride, sodium chloride, traMADol, zolpidem   Assessment:   1. Acute on chronic systolic HF due to NICM EF 25% 2. Cardiogenic shock 3. PAF   - Tikosyn stopped recently due to worsening CKD   - unable to tolerate amio due to n/v 4. CKD, stage 4 5. Hypothyroidism  Plan/Discussion:    Increase dobutamine 5 mcg/kg/min. CO-OX today 45%.  Volume status elevated. Continue  lasix 80 mg twice daily.  Renal function up a little.    Completed ceftriaxone for UTI. Urine culture negative.    A fib . INR 2.98 . Started back on tikosyn.  For DC-CV today.   Length of Stay: [place] NP-C  11/09/2014, 7:33 AM Advanced Heart Failure Team Pager [phone] (M-F; 7a - 4p)  Please contact [facility] for night-coverage after hours (4p -7a ) and weekends on [email or web address]  Patient seen and examined with Darrick Grinder, NP. We discussed all aspects of the encounter. I agree with the assessment and plan as stated above.   She remains with low output HF despite increase of dobutamine. CVP slightly improved with IV lasix but CVP still 17. Renal function slightly worse. We are running out of options. I think the only option is to try to get her back in NSR today to see if that helps her hemodynamics. She has been loaded on Tikosyn. I have d/w Dr. Caryl Comes and her daughters. We will proceed with DC-CV today.  The patient is critically ill with multiple organ systems failure and requires high complexity decision making for assessment and support, frequent evaluation and titration of therapies, application of advanced monitoring technologies and extensive interpretation of multiple databases.   Critical Care Time devoted to patient care services described in this note is 35 Minutes.  Bensimhon, Daniel,MD 11:07 AM

## 2014-11-09 NOTE — CV Procedure (Signed)
     DIRECT CURRENT CARDIOVERSION  NAME:  Sandra [name]   MRN: [id number] DOB:  [birth year]   ADMIT DATE: 10/29/2014   INDICATIONS: Atrial fibrillation    PROCEDURE:   Informed consent was obtained prior to the procedure. The risks, benefits and alternatives for the procedure were discussed and the patient comprehended these risks. Once an appropriate time out was taken, the patient had the defibrillator pads placed in the anterior and posterior position. The patient then underwent sedation by the anesthesia service. Once an appropriate level of sedation was achieved, the patient received a single biphasic, synchronized 150J shock with prompt conversion to sinus rhythm. No apparent complications.  Bensimhon, Daniel,MD 11:19 AM

## 2014-11-09 NOTE — Anesthesia Postprocedure Evaluation (Signed)
  Anesthesia Post-op Note  Patient: Sandra [name]  Procedure(s) Performed: Procedure(s) (LRB): CARDIOVERSION (N/A)  Patient Location: PACU  Anesthesia Type: MAC  Level of Consciousness: awake and alert   Airway and Oxygen Therapy: Patient Spontanous Breathing  Post-op Pain: mild  Post-op Assessment: Post-op Vital signs reviewed, Patient's Cardiovascular Status Stable, Respiratory Function Stable, Patent Airway and No signs of Nausea or vomiting  Last Vitals:  Filed Vitals:   11/09/14 1300  BP: 117/91  Pulse:   Temp:   Resp: 18    Post-op Vital Signs: stable   Complications: No apparent anesthesia complications

## 2014-11-10 LAB — CBC
HEMATOCRIT: 36.7 % (ref 36.0–46.0)
HEMOGLOBIN: 11.8 g/dL — AB (ref 12.0–15.0)
MCH: 28.2 pg (ref 26.0–34.0)
MCHC: 32.2 g/dL (ref 30.0–36.0)
MCV: 87.6 fL (ref 78.0–100.0)
Platelets: 215 10*3/uL (ref 150–400)
RBC: 4.19 MIL/uL (ref 3.87–5.11)
RDW: 15.6 % — ABNORMAL HIGH (ref 11.5–15.5)
WBC: 5.7 10*3/uL (ref 4.0–10.5)

## 2014-11-10 LAB — BASIC METABOLIC PANEL
ANION GAP: 13 (ref 5–15)
BUN: 31 mg/dL — ABNORMAL HIGH (ref 6–20)
CALCIUM: 9.3 mg/dL (ref 8.9–10.3)
CHLORIDE: 88 mmol/L — AB (ref 101–111)
CO2: 30 mmol/L (ref 22–32)
CREATININE: 1.79 mg/dL — AB (ref 0.44–1.00)
GFR calc non Af Amer: 26 mL/min — ABNORMAL LOW (ref >60)
GFR, EST AFRICAN AMERICAN: 30 mL/min — AB (ref >60)
Glucose, Bld: 130 mg/dL — ABNORMAL HIGH (ref 65–99)
Potassium: 3.3 mmol/L — ABNORMAL LOW (ref 3.5–5.1)
SODIUM: 131 mmol/L — AB (ref 135–145)

## 2014-11-10 LAB — PROTIME-INR
INR: 3.58 — ABNORMAL HIGH (ref 0.00–1.49)
Prothrombin Time: 35 seconds — ABNORMAL HIGH (ref 11.6–15.2)

## 2014-11-10 LAB — CARBOXYHEMOGLOBIN
CARBOXYHEMOGLOBIN: 1.5 % (ref 0.5–1.5)
Methemoglobin: 0.9 % (ref 0.0–1.5)
O2 SAT: 55.9 %
TOTAL HEMOGLOBIN: 11.8 g/dL — AB (ref 12.0–16.0)

## 2014-11-10 MED ORDER — FUROSEMIDE 80 MG PO TABS
80.0000 mg | ORAL_TABLET | Freq: Two times a day (BID) | ORAL | Status: DC
  Administered 2014-11-10: 80 mg via ORAL
  Filled 2014-11-10: qty 1

## 2014-11-10 MED ORDER — POTASSIUM CHLORIDE CRYS ER 20 MEQ PO TBCR
40.0000 meq | EXTENDED_RELEASE_TABLET | Freq: Once | ORAL | Status: AC
  Administered 2014-11-10: 40 meq via ORAL
  Filled 2014-11-10: qty 2

## 2014-11-10 NOTE — Progress Notes (Signed)
Patient ID: CHERIL SLATTERY, female   DOB: Aug 29, 1932, 81 y.o.   MRN: [id number]    Patient Name: Sandra [name] Date of Encounter: 11/10/2014     Active Problems:   Hypertrophic obstructive cardiomyopathy ([place])   Chronic kidney disease, stage III (moderate)   Biventricular implantable cardioverter-defibrillator -St. Jude   Acute on chronic systolic congestive heart failure (HCC)   Cardiogenic shock (HCC)   Persistent atrial fibrillation (HCC)   RBBB plus LA hemiblock    SUBJECTIVE  No chest pain or sob, s/p DCCV. She is maintaining NSR  CURRENT MEDS . dofetilide  250 mcg Oral Q12H  . ezetimibe  10 mg Oral Q M,W,F  . fluticasone  2 spray Each Nare Daily  . furosemide  80 mg Intravenous BID  . levothyroxine  100 mcg Oral Q breakfast  . polyethylene glycol  17 g Oral Daily  . potassium chloride  20 mEq Oral Daily  . ranolazine  500 mg Oral BID  . sodium chloride  10-40 mL Intracatheter Q12H  . sodium chloride  3 mL Intravenous Q12H  . sodium chloride  3 mL Intravenous Q12H  . Warfarin - Pharmacist Dosing Inpatient   Does not apply q1800    OBJECTIVE  Filed Vitals:   11/09/14 1900 11/09/14 2338 11/10/14 0410 11/10/14 0829  BP: 122/87 109/67 111/74 144/81  Pulse:    72  Temp: 97.6 F (36.4 C) 97.7 F (36.5 C) 97.9 F (36.6 C) 97.8 F (36.6 C)  TempSrc: Oral Oral Oral Oral  Resp: 16 18 18 20   Height:      Weight:   142 lb 4.8 oz (64.547 kg)   SpO2: 94% 97% 95% 96%    Intake/Output Summary (Last 24 hours) at 11/10/14 1016 Last data filed at 11/10/14 0956  Gross per 24 hour  Intake  906.5 ml  Output   3000 ml  Net -2093.5 ml   Filed Weights   11/08/14 0420 11/09/14 0313 11/10/14 0410  Weight: 146 lb 13.2 oz (66.6 kg) 144 lb 9.6 oz (65.59 kg) 142 lb 4.8 oz (64.547 kg)    PHYSICAL EXAM  General: Pleasant, NAD. Neuro: Alert and oriented X 3. Moves all extremities spontaneously. Psych: Normal affect. HEENT:  Normal  Neck: Supple without bruits or  JVD. Lungs:  Resp regular and unlabored, CTA. Heart: RRR no s3, s4, or murmurs. Abdomen: Soft, non-tender, non-distended, BS + x 4.  Extremities: No clubbing, cyanosis or edema. DP/PT/Radials 2+ and equal bilaterally.  Accessory Clinical Findings  CBC  Recent Labs  11/10/14 0420  WBC 5.7  HGB 11.8*  HCT 36.7  MCV 87.6  PLT 326   Basic Metabolic Panel  Recent Labs  11/08/14 0233 11/09/14 0320 11/10/14 0420  NA 131* 131* 131*  K 4.5 3.6 3.3*  CL 98* 92* 88*  CO2 25 28 30   GLUCOSE 107* 100* 130*  BUN 24* 25* 31*  CREATININE 1.38* 1.52* 1.79*  CALCIUM 8.9 9.5 9.3  MG 2.3 2.0  --    Liver Function Tests No results for input(s): AST, ALT, ALKPHOS, BILITOT, PROT, ALBUMIN in the last 72 hours. No results for input(s): LIPASE, AMYLASE in the last 72 hours. Cardiac Enzymes No results for input(s): CKTOTAL, CKMB, CKMBINDEX, TROPONINI in the last 72 hours. BNP Invalid input(s): POCBNP D-Dimer No results for input(s): DDIMER in the last 72 hours. Hemoglobin A1C No results for input(s): HGBA1C in the last 72 hours. Fasting Lipid Panel No results for input(s): CHOL, HDL, LDLCALC, TRIG,  CHOLHDL, LDLDIRECT in the last 72 hours. Thyroid Function Tests No results for input(s): TSH, T4TOTAL, T3FREE, THYROIDAB in the last 72 hours.  Invalid input(s): FREET3  TELE  nsr with BiV Pacing  Radiology/Studies  Dg Chest 2 View  10/23/2014  CLINICAL DATA:  Chest pressure, atrial fibrillation EXAM: CHEST  2 VIEW COMPARISON:  12/18/2012 FINDINGS: Cardiomegaly again noted. Three leads cardiac pacemaker is unchanged in position. No acute infiltrate or pleural effusion. No pulmonary edema. Osteopenia and degenerative changes thoracic spine. IMPRESSION: No active cardiopulmonary disease. Electronically Signed   By: Lahoma Crocker M.D.   On: 10/23/2014 19:22   Dg Chest Port 1 View  11/02/2014  CLINICAL DATA:  PICC line placement EXAM: PORTABLE CHEST 1 VIEW COMPARISON:  10/23/2014 FINDINGS:  Right internal jugular vascular sheath is in place with the tip in the SVC. Right PICC line has been placed. The tip also in the SVC. Left pacer is unchanged. Cardiomegaly. Lungs are clear. No effusions or edema. No acute bony abnormality. IMPRESSION: Right internal jugular vascular sheath and right PICC line tips are in the SVC. No pneumothorax. Cardiomegaly. Electronically Signed   By: Rolm Baptise M.D.   On: 11/02/2014 16:20    ASSESSMENT AND PLAN  1. Atrial fib, s/p DCCV 2. Acute on chronic systolic heart failure on Dobutamine. Co-ox noted 3. Ongoing tikosyn therapy Rec: The patient is maintaining NSR after DCCV. Tikosyn is continued. Her QT is prolonged but most of this is due to QRS prolongation. With ICD in place, would continue Tikosyn at current dose but watch renal function. I'll defer CHF management to Dr. Reine Just and team.  Carleene Overlie Taylor,M.D.  11/10/2014 10:16 AM

## 2014-11-10 NOTE — Progress Notes (Signed)
Advanced Heart Failure Rounding Note   Subjective:    S/p DC/CV on 10/28. Remains in NSR on Tikosyn and Ranexa.   Co-ox 56% on dobutamine 5.   Feels great today. Walking hall. CVP 9  Objective:   Weight Range:  Vital Signs:   Temp:  [97.6 F (36.4 C)-98.1 F (36.7 C)] 97.8 F (36.6 C) (10/29 0829) Pulse Rate:  [70-99] 72 (10/29 0829) Resp:  [13-22] 20 (10/29 0829) BP: (97-155)/(48-97) 144/81 mmHg (10/29 0829) SpO2:  [94 %-100 %] 96 % (10/29 0829) Weight:  [64.547 kg (142 lb 4.8 oz)] 64.547 kg (142 lb 4.8 oz) (10/29 0410) Last BM Date: 11/06/14  Weight change: Filed Weights   11/08/14 0420 11/09/14 0313 11/10/14 0410  Weight: 66.6 kg (146 lb 13.2 oz) 65.59 kg (144 lb 9.6 oz) 64.547 kg (142 lb 4.8 oz)    Intake/Output:   Intake/Output Summary (Last 24 hours) at 11/10/14 1014 Last data filed at 11/10/14 0956  Gross per 24 hour  Intake  906.5 ml  Output   3000 ml  Net -2093.5 ml     Physical Exam: CVP 9 GEN- NAD . Walking hall  HEENT: normal Neck.  Supple. JVP 9  Lungs- Clear to ausculation bilaterally, normal work of breathing. No wheezes, rales, rhonchi Heart- PMI laterally displaced. Regular with ectopy GI- soft, non-tender, non-distended, bowel sounds present  Extremities- no clubbing, cyanosis, trace edema; DP/PT/radial pulses 1+ bilaterally MS- no significant deformity or atrophy Skin- warm and dry, no rash or lesion; ICD pocket well healed Neuro- Alert and oriented x3.   Telemetry:  NSR  With vpacing  Labs: Basic Metabolic Panel:  Recent Labs Lab 11/06/14 0435 11/06/14 1405 11/07/14 0455 11/08/14 0233 11/09/14 0320 11/10/14 0420  NA 137  --  133* 131* 131* 131*  K 3.9 4.2 4.5 4.5 3.6 3.3*  CL 96*  --  98* 98* 92* 88*  CO2 30  --  27 25 28 30   GLUCOSE 92  --  90 107* 100* 130*  BUN 24*  --  27* 24* 25* 31*  CREATININE 1.51*  --  1.51* 1.38* 1.52* 1.79*  CALCIUM 9.1  --  8.9 8.9 9.5 9.3  MG 2.1  --  2.4 2.3 2.0  --     Liver  Function Tests: No results for input(s): AST, ALT, ALKPHOS, BILITOT, PROT, ALBUMIN in the last 168 hours. No results for input(s): LIPASE, AMYLASE in the last 168 hours. No results for input(s): AMMONIA in the last 168 hours.  CBC:  Recent Labs Lab 11/04/14 0423 11/06/14 0740 11/10/14 0420  WBC 7.6 6.3 5.7  NEUTROABS  --  4.3  --   HGB 13.3 11.8* 11.8*  HCT 41.8 37.7 36.7  MCV 88.7 88.1 87.6  PLT 188 167 215    Cardiac Enzymes: No results for input(s): CKTOTAL, CKMB, CKMBINDEX, TROPONINI in the last 168 hours.  BNP: BNP (last 3 results) No results for input(s): BNP in the last 8760 hours.  ProBNP (last 3 results) No results for input(s): PROBNP in the last 8760 hours.    Other results:  Imaging: No results found.   Medications:     Scheduled Medications: . dofetilide  250 mcg Oral Q12H  . ezetimibe  10 mg Oral Q M,W,F  . fluticasone  2 spray Each Nare Daily  . furosemide  80 mg Intravenous BID  . levothyroxine  100 mcg Oral Q breakfast  . polyethylene glycol  17 g Oral Daily  . potassium  chloride  20 mEq Oral Daily  . ranolazine  500 mg Oral BID  . sodium chloride  10-40 mL Intracatheter Q12H  . sodium chloride  3 mL Intravenous Q12H  . sodium chloride  3 mL Intravenous Q12H  . Warfarin - Pharmacist Dosing Inpatient   Does not apply q1800    Infusions: . sodium chloride 10 mL/hr at 11/10/14 0800  . sodium chloride    . sodium chloride    . DOBUTamine 5 mcg/kg/min (11/10/14 0800)    PRN Medications: acetaminophen, albuterol, ALPRAZolam, fentaNYL (SUBLIMAZE) injection, ondansetron (ZOFRAN) IV, sodium chloride, sodium chloride, sodium chloride, traMADol, zolpidem   Assessment:   1. Acute on chronic systolic HF due to NICM EF 25% 2. Cardiogenic shock 3. PAF   - Tikosyn stopped recently due to worsening CKD   - unable to tolerate amio due to n/v 4. CKD, stage 4 5. Hypothyroidism  Plan/Discussion:    Feels much better S/p DC-CV on 10/28.  Remains in NSR on Tikosyn. Ranexa also added.   Continue dobutamine 5. Co-ox 56%. Creatinine up slightly 1.5->1.8  Change lasix to 80 po bid. Will shoot for d/c on Monday if remains stable.   Length of Stay: 12 Glori Bickers MD 11/10/2014, 10:14 AM Advanced Heart Failure Team Pager [phone] (M-F; [place])  Please contact [facility] for night-coverage after hours (4p -7a ) and weekends on [email or web address]

## 2014-11-10 NOTE — Progress Notes (Signed)
CARDIAC REHAB PHASE I   PRE:  Rate/Rhythm: 71 paced  BP:  Supine: 107/68  Sitting:   Standing:    SaO2: 95% RA  MODE:  Ambulation: 300 ft   POST:  Rate/Rhythm: 80 paced  BP:  Supine:   Sitting: 131/79  Standing:    SaO2: 95% RA  [id number] Pt ambulated 300 ft with assist x1. Gait, slow steady, 1 standing rest break taken, no c/o. Vital signs stable after ambulation, IV intact.   Sol Passer, MS, ACSM CCEP

## 2014-11-10 NOTE — Progress Notes (Signed)
ANTICOAGULATION CONSULT NOTE - Follow Up Consult  Pharmacy Consult for Coumadin Indication: atrial fibrillation   Patient Measurements: Height: 5\' 3"  (160 cm) Weight: 142 lb 4.8 oz (64.547 kg) IBW/kg (Calculated) : 52.4  Vital Signs: Temp: 98.4 F (36.9 C) (10/29 1517) Temp Source: Oral (10/29 1517) BP: 103/65 mmHg (10/29 1517) Pulse Rate: 74 (10/29 1517)  Labs:  Recent Labs  11/08/14 0233 11/09/14 0320 11/10/14 0420  HGB  --   --  11.8*  HCT  --   --  36.7  PLT  --   --  215  LABPROT 27.5* 30.5* 35.0*  INR 2.61* 2.98* 3.58*  CREATININE 1.38* 1.52* 1.79*    Estimated Creatinine Clearance: 22.6 mL/min (by C-G formula based on Cr of 1.79).  Assessment: 82yof continues on coumadin for afib.   INR supratherapeutic at 3.58. Pt has been requiring more 5 mg doses since off amiodarone and with increased liver perfusion/cardiac output on dobutamine, however INR increasing by 0.3 x 2 days with 5 mg doses. INR rose by 0.5 despite lowered dose of 2.5 mg yesterday. No bleeding noted.   PTA coumadin dose: 5mg  MF / 2.5mg  AOD  Goal of Therapy:  INR 2-3 Monitor platelets by anticoagulation protocol: Yes   Plan:  Hold warfarin tonight Daily INR Monitor s/sx bleeding    Heloise Ochoa, Pharm.D. PGY2 Cardiology Pharmacy Resident Pager: [phone]  11/10/2014 3:28 PM

## 2014-11-11 LAB — BASIC METABOLIC PANEL
Anion gap: 12 (ref 5–15)
BUN: 29 mg/dL — AB (ref 6–20)
CO2: 32 mmol/L (ref 22–32)
Calcium: 9.3 mg/dL (ref 8.9–10.3)
Chloride: 89 mmol/L — ABNORMAL LOW (ref 101–111)
Creatinine, Ser: 1.8 mg/dL — ABNORMAL HIGH (ref 0.44–1.00)
GFR calc Af Amer: 29 mL/min — ABNORMAL LOW (ref >60)
GFR, EST NON AFRICAN AMERICAN: 25 mL/min — AB (ref >60)
GLUCOSE: 94 mg/dL (ref 65–99)
POTASSIUM: 3.4 mmol/L — AB (ref 3.5–5.1)
Sodium: 133 mmol/L — ABNORMAL LOW (ref 135–145)

## 2014-11-11 LAB — PROTIME-INR
INR: 2.94 — AB (ref 0.00–1.49)
PROTHROMBIN TIME: 30.1 s — AB (ref 11.6–15.2)

## 2014-11-11 LAB — CARBOXYHEMOGLOBIN
CARBOXYHEMOGLOBIN: 1.5 % (ref 0.5–1.5)
Methemoglobin: 0.7 % (ref 0.0–1.5)
O2 SAT: 58.8 %
Total hemoglobin: 12.7 g/dL (ref 12.0–16.0)

## 2014-11-11 MED ORDER — WARFARIN SODIUM 2.5 MG PO TABS
2.5000 mg | ORAL_TABLET | Freq: Once | ORAL | Status: AC
  Administered 2014-11-11: 2.5 mg via ORAL
  Filled 2014-11-11: qty 1

## 2014-11-11 MED ORDER — FUROSEMIDE 40 MG PO TABS
40.0000 mg | ORAL_TABLET | Freq: Two times a day (BID) | ORAL | Status: DC
  Filled 2014-11-11: qty 1

## 2014-11-11 MED ORDER — POTASSIUM CHLORIDE CRYS ER 20 MEQ PO TBCR
40.0000 meq | EXTENDED_RELEASE_TABLET | Freq: Once | ORAL | Status: AC
  Administered 2014-11-11: 40 meq via ORAL
  Filled 2014-11-11: qty 2

## 2014-11-11 NOTE — Progress Notes (Signed)
Advanced Heart Failure Rounding Note   Subjective:    S/p DC/CV on 10/28. Remains in NSR on Tikosyn and Ranexa.   Continues to feel well on dobutamine 5. Started po lasix 80 bid yesterday. Weight down 2 pounds. Renal function stable.  Feels great today. Walking hall. CVP 3  Objective:   Weight Range:  Vital Signs:   Temp:  [97.8 F (36.6 C)-98.4 F (36.9 C)] 98.3 F (36.8 C) (10/30 0304) Pulse Rate:  [67-76] 67 (10/30 0304) Resp:  [12-20] 16 (10/29 1911) BP: (96-144)/(65-81) 112/76 mmHg (10/30 0304) SpO2:  [91 %-98 %] 91 % (10/30 0304) Weight:  [63.8 kg (140 lb 10.5 oz)] 63.8 kg (140 lb 10.5 oz) (10/30 0304) Last BM Date: 11/06/14  Weight change: Filed Weights   11/09/14 0313 11/10/14 0410 11/11/14 0304  Weight: 65.59 kg (144 lb 9.6 oz) 64.547 kg (142 lb 4.8 oz) 63.8 kg (140 lb 10.5 oz)    Intake/Output:   Intake/Output Summary (Last 24 hours) at 11/11/14 0658 Last data filed at 11/11/14 0600  Gross per 24 hour  Intake    855 ml  Output   1800 ml  Net   -945 ml     Physical Exam: CVP 3 GEN- NAD . Walking hall  HEENT: normal Neck.  Supple. JVP flat Lungs- Clear to ausculation bilaterally, normal work of breathing. No wheezes, rales, rhonchi Heart- PMI laterally displaced. Regular  GI- soft, non-tender, non-distended, bowel sounds present  Extremities- no clubbing, cyanosis, trace edema; DP/PT/radial pulses 1+ bilaterally MS- no significant deformity or atrophy Skin- warm and dry, no rash or lesion; ICD pocket well healed Neuro- Alert and oriented x3.   Telemetry:  NSR  With vpacing  Labs: Basic Metabolic Panel:  Recent Labs Lab 11/06/14 0435  11/07/14 0455 11/08/14 0233 11/09/14 0320 11/10/14 0420 11/11/14 0450  NA 137  --  133* 131* 131* 131* 133*  K 3.9  < > 4.5 4.5 3.6 3.3* 3.4*  CL 96*  --  98* 98* 92* 88* 89*  CO2 30  --  27 25 28 30  32  GLUCOSE 92  --  90 107* 100* 130* 94  BUN 24*  --  27* 24* 25* 31* 29*  CREATININE 1.51*  --   1.51* 1.38* 1.52* 1.79* 1.80*  CALCIUM 9.1  --  8.9 8.9 9.5 9.3 9.3  MG 2.1  --  2.4 2.3 2.0  --   --   < > = values in this interval not displayed.  Liver Function Tests: No results for input(s): AST, ALT, ALKPHOS, BILITOT, PROT, ALBUMIN in the last 168 hours. No results for input(s): LIPASE, AMYLASE in the last 168 hours. No results for input(s): AMMONIA in the last 168 hours.  CBC:  Recent Labs Lab 11/06/14 0740 11/10/14 0420  WBC 6.3 5.7  NEUTROABS 4.3  --   HGB 11.8* 11.8*  HCT 37.7 36.7  MCV 88.1 87.6  PLT 167 215    Cardiac Enzymes: No results for input(s): CKTOTAL, CKMB, CKMBINDEX, TROPONINI in the last 168 hours.  BNP: BNP (last 3 results) No results for input(s): BNP in the last 8760 hours.  ProBNP (last 3 results) No results for input(s): PROBNP in the last 8760 hours.    Other results:  Imaging: No results found.   Medications:     Scheduled Medications: . dofetilide  250 mcg Oral Q12H  . ezetimibe  10 mg Oral Q M,W,F  . fluticasone  2 spray Each Nare Daily  .  furosemide  80 mg Oral BID  . levothyroxine  100 mcg Oral Q breakfast  . polyethylene glycol  17 g Oral Daily  . potassium chloride  20 mEq Oral Daily  . ranolazine  500 mg Oral BID  . sodium chloride  10-40 mL Intracatheter Q12H  . sodium chloride  3 mL Intravenous Q12H  . sodium chloride  3 mL Intravenous Q12H  . Warfarin - Pharmacist Dosing Inpatient   Does not apply q1800    Infusions: . sodium chloride 10 mL/hr at 11/10/14 2255  . sodium chloride    . sodium chloride    . DOBUTamine 5 mcg/kg/min (11/10/14 0800)    PRN Medications: acetaminophen, albuterol, ALPRAZolam, fentaNYL (SUBLIMAZE) injection, ondansetron (ZOFRAN) IV, sodium chloride, sodium chloride, sodium chloride, traMADol, zolpidem   Assessment:   1. Acute on chronic systolic HF due to NICM EF 25% 2. Cardiogenic shock 3. PAF   - Tikosyn stopped recently due to worsening CKD   - unable to tolerate amio due  to n/v 4. CKD, stage 4 5. Hypothyroidism  Plan/Discussion:    Feels much better S/p DC-CV on 10/28. Remains in NSR on Tikosyn. Ranexa also added.   Continue dobutamine 5. Co-ox pending. Back on po diuretics but CVP low. Cut lasix back to 40 bid. Creatinine stable.   Probable d/c tomorrow.   Length of Stay: 13 Glori Bickers MD 11/11/2014, 6:58 AM Advanced Heart Failure Team Pager [phone] (M-F; 7a - 4p)  Please contact [facility] for night-coverage after hours (4p -7a ) and weekends on [email or web address]

## 2014-11-11 NOTE — Progress Notes (Signed)
ANTICOAGULATION CONSULT NOTE - Follow Up Consult  Pharmacy Consult for Coumadin Indication: atrial fibrillation   Patient Measurements: Height: 5\' 3"  (160 cm) Weight: 140 lb 10.5 oz (63.8 kg) IBW/kg (Calculated) : 52.4  Vital Signs: Temp: 98.1 F (36.7 C) (10/30 1100) Temp Source: Oral (10/30 1100) BP: 130/87 mmHg (10/30 1100) Pulse Rate: 71 (10/30 0802)  Labs:  Recent Labs  11/09/14 0320 11/10/14 0420 11/11/14 0450  HGB  --  11.8*  --   HCT  --  36.7  --   PLT  --  215  --   LABPROT 30.5* 35.0* 30.1*  INR 2.98* 3.58* 2.94*  CREATININE 1.52* 1.79* 1.80*    Estimated Creatinine Clearance: 22.4 mL/min (by C-G formula based on Cr of 1.8).  Assessment: 82yof continues on coumadin for afib.   INR therapeutic at 2.94 after holding x 1 day. Pt had been requiring more 5 mg doses since off amiodarone and with increased liver perfusion/cardiac output on dobutamine. No bleeding noted. Will resume lower dose warfarin today.  PTA coumadin dose: 5mg  MF / 2.5mg  AOD  Goal of Therapy:  INR 2-3 Monitor platelets by anticoagulation protocol: Yes   Plan:  Warfarin 2.5 mg x 1 tonight Daily INR Monitor s/sx bleeding    Heloise Ochoa, Pharm.D. PGY2 Cardiology Pharmacy Resident Pager: [phone]  11/11/2014 2:11 PM

## 2014-11-12 ENCOUNTER — Encounter (HOSPITAL_COMMUNITY): Payer: Self-pay | Admitting: Internal Medicine

## 2014-11-12 ENCOUNTER — Ambulatory Visit: Payer: Medicare Other

## 2014-11-12 LAB — BASIC METABOLIC PANEL
Anion gap: 11 (ref 5–15)
BUN: 24 mg/dL — AB (ref 6–20)
CALCIUM: 9 mg/dL (ref 8.9–10.3)
CO2: 26 mmol/L (ref 22–32)
CREATININE: 1.5 mg/dL — AB (ref 0.44–1.00)
Chloride: 95 mmol/L — ABNORMAL LOW (ref 101–111)
GFR calc non Af Amer: 32 mL/min — ABNORMAL LOW (ref >60)
GFR, EST AFRICAN AMERICAN: 37 mL/min — AB (ref >60)
Glucose, Bld: 108 mg/dL — ABNORMAL HIGH (ref 65–99)
Potassium: 3.8 mmol/L (ref 3.5–5.1)
SODIUM: 132 mmol/L — AB (ref 135–145)

## 2014-11-12 LAB — CARBOXYHEMOGLOBIN
Carboxyhemoglobin: 1.1 % (ref 0.5–1.5)
Methemoglobin: 0.9 % (ref 0.0–1.5)
O2 SAT: 55.6 %
TOTAL HEMOGLOBIN: 12.7 g/dL (ref 12.0–16.0)

## 2014-11-12 LAB — PROTIME-INR
INR: 2.44 — ABNORMAL HIGH (ref 0.00–1.49)
PROTHROMBIN TIME: 26.2 s — AB (ref 11.6–15.2)

## 2014-11-12 MED ORDER — WARFARIN SODIUM 2.5 MG PO TABS: 2.5000 mg | ORAL_TABLET | ORAL | Status: DC

## 2014-11-12 MED ORDER — WARFARIN SODIUM 5 MG PO TABS
5.0000 mg | ORAL_TABLET | ORAL | Status: DC
  Administered 2014-11-12: 5 mg via ORAL
  Filled 2014-11-12 (×2): qty 1

## 2014-11-12 MED ORDER — POTASSIUM CHLORIDE CRYS ER 20 MEQ PO TBCR
40.0000 meq | EXTENDED_RELEASE_TABLET | Freq: Every day | ORAL | Status: DC
  Administered 2014-11-12 – 2014-11-13 (×2): 40 meq via ORAL
  Filled 2014-11-12 (×2): qty 2

## 2014-11-12 MED ORDER — FUROSEMIDE 10 MG/ML IJ SOLN
80.0000 mg | Freq: Two times a day (BID) | INTRAMUSCULAR | Status: DC
  Administered 2014-11-12 – 2014-11-13 (×3): 80 mg via INTRAVENOUS
  Filled 2014-11-12 (×3): qty 8

## 2014-11-12 MED ORDER — POTASSIUM CHLORIDE CRYS ER 20 MEQ PO TBCR
40.0000 meq | EXTENDED_RELEASE_TABLET | Freq: Once | ORAL | Status: AC
  Administered 2014-11-12: 40 meq via ORAL

## 2014-11-12 NOTE — Progress Notes (Addendum)
SUBJECTIVE: The patient is feeling a little nauseous today.  No CP, no rest SOB, reports getting out of bed daily and overall feeling better.  She occasionally feels her HR fast.  . dofetilide  250 mcg Oral Q12H  . ezetimibe  10 mg Oral Q M,W,F  . fluticasone  2 spray Each Nare Daily  . furosemide  80 mg Intravenous BID  . levothyroxine  100 mcg Oral Q breakfast  . polyethylene glycol  17 g Oral Daily  . potassium chloride  40 mEq Oral Daily  . potassium chloride  40 mEq Oral Once  . ranolazine  500 mg Oral BID  . sodium chloride  10-40 mL Intracatheter Q12H  . sodium chloride  3 mL Intravenous Q12H  . sodium chloride  3 mL Intravenous Q12H  . [START ON 11/13/2014] warfarin  2.5 mg Oral Once per day on Sun Tue Thu Sat  . warfarin  5 mg Oral Once per day on Mon Wed Fri  . Warfarin - Pharmacist Dosing Inpatient   Does not apply q1800   . sodium chloride 10 mL/hr at 11/11/14 1600  . sodium chloride    . sodium chloride    . DOBUTamine 5 mcg/kg/min (11/11/14 1900)    OBJECTIVE: Physical Exam: Filed Vitals:   11/12/14 0411 11/12/14 0415 11/12/14 0742 11/12/14 1132  BP:  133/71 128/80 131/73  Pulse:      Temp: 98.1 F (36.7 C)  98.3 F (36.8 C) 97.8 F (36.6 C)  TempSrc: Oral  Oral Oral  Resp:  16 18 18   Height:      Weight: 143 lb 1.6 oz (64.91 kg)     SpO2: 96%  97% 96%    Intake/Output Summary (Last 24 hours) at 11/12/14 1539 Last data filed at 11/12/14 1300  Gross per 24 hour  Intake    410 ml  Output   3000 ml  Net  -2590 ml    Telemetry reveals SRsinus tach with V pacing, occasionally appears possibly an atrial tachycardia, and some AV pacing as well  GEN- The patient is ill appearing, alert and oriented x 3 today.   Head- normocephalic, atraumatic Eyes-  Sclera clear, conjunctiva pink Ears- hearing intact Oropharynx- clear Lungs- Clear to ausculation bilaterally, normal work of breathing Heart- Regular rate and rhythm, no significant murmurs, no rubs or  gallops GI- soft, NT, ND, + BS Extremities- no clubbing, cyanosis, or edema Skin- no rash or lesion Psych- euthymic mood, full affect Neuro- no gross deficits appreciated  LABS: Basic Metabolic Panel:  Recent Labs  11/11/14 0450 11/12/14 0530  NA 133* 132*  K 3.4* 3.8  CL 89* 95*  CO2 32 26  GLUCOSE 94 108*  BUN 29* 24*  CREATININE 1.80* 1.50*  CALCIUM 9.3 9.0    CBC:  Recent Labs  11/10/14 0420  WBC 5.7  HGB 11.8*  HCT 36.7  MCV 87.6  PLT 215   INR today is 2.44   RADIOLOGY: Dg Chest Port 1 View 11/02/2014  CLINICAL DATA:  PICC line placement EXAM: PORTABLE CHEST 1 VIEW COMPARISON:  10/23/2014 FINDINGS: Right internal jugular vascular sheath is in place with the tip in the SVC. Right PICC line has been placed. The tip also in the SVC. Left pacer is unchanged. Cardiomegaly. Lungs are clear. No effusions or edema. No acute bony abnormality. IMPRESSION: Right internal jugular vascular sheath and right PICC line tips are in the SVC. No pneumothorax. Cardiomegaly. Electronically Signed   By: Lennette Bihari  [place] M.D.   On: 11/02/2014 16:20    ASSESSMENT AND PLAN:  1. Acute on chronic systolic HF due to NICM EF 25%     STJ CRTD 2. Cardiogenic shock     s/p RHC     On dobutamine gtt 3. PAF  - Tikosyn stopped recently due to worsening CKD  - unable to tolerate amio due to n/v, now s/p DCCV and back on Tikosyn and in SR/AVpaced   - K+ replaced/adjusted today with CHF team   - Warfarin therapeutic   - magnesium 10/27 was 2.0 4. CKD, stage 4    -stable, BUN/Creat down some from yesterday   Tommye Standard, PA-C 11/12/2014 3:39 PM   As per CHF Unfortunately she has revereted to atrial fibrillation but surprisingly with controlled ventricular rate 2 strategies present themselves--forego efforts of rhythm control and d/c dofetilide and ranolazine or repeat cardioversion with the idea being that 3-4 days of NSR may make the benefits of repeat cardioversion greater  I would  not be inclined to cardiovert and as long as her HR is controlled would not ablate AV node.  I would ask CHF to consider dig if renal funcition is stable  Have spoken with DB tonight;  His inclination is AV ablation and no dig :))   I am not sanguine that she is going to do well

## 2014-11-12 NOTE — Progress Notes (Signed)
Advanced Heart Failure Rounding Note   Subjective:    S/p DC/CV on 10/28. Remains in NSR on Tikosyn and Ranexa.   Continues Dobutamine 5. Renal function stable. CVP 16  Denies SOB.  Objective:   Weight Range:  Vital Signs:   Temp:  [97.9 F (36.6 C)-98.3 F (36.8 C)] 98.3 F (36.8 C) (10/31 0742) Resp:  [15-18] 18 (10/31 0742) BP: (117-134)/(71-87) 128/80 mmHg (10/31 0742) SpO2:  [95 %-98 %] 97 % (10/31 0742) Weight:  [143 lb 1.6 oz (64.91 kg)] 143 lb 1.6 oz (64.91 kg) (10/31 0411) Last BM Date: 11/06/14  Weight change: Filed Weights   11/10/14 0410 11/11/14 0304 11/12/14 0411  Weight: 142 lb 4.8 oz (64.547 kg) 140 lb 10.5 oz (63.8 kg) 143 lb 1.6 oz (64.91 kg)    Intake/Output:   Intake/Output Summary (Last 24 hours) at 11/12/14 0928 Last data filed at 11/12/14 0800  Gross per 24 hour  Intake    185 ml  Output   2250 ml  Net  -2065 ml     Physical Exam: CVP 16 GEN- NAD . Walking hall  HEENT: normal Neck.  Supple. JVP ~16 Lungs- Clear to ausculation bilaterally, normal work of breathing. No wheezes, rales, rhonchi Heart- PMI laterally displaced. Regular  GI- soft, non-tender, non-distended, bowel sounds present  Extremities- no clubbing, cyanosis, trace edema; DP/PT/radial pulses 1+ bilaterally MS- no significant deformity or atrophy Skin- warm and dry, no rash or lesion; ICD pocket well healed Neuro- Alert and oriented x3.   Telemetry:  NSR  With vpacing  Labs: Basic Metabolic Panel:  Recent Labs Lab 11/06/14 0435  11/07/14 0455 11/08/14 0233 11/09/14 0320 11/10/14 0420 11/11/14 0450 11/12/14 0530  NA 137  --  133* 131* 131* 131* 133* 132*  K 3.9  < > 4.5 4.5 3.6 3.3* 3.4* 3.8  CL 96*  --  98* 98* 92* 88* 89* 95*  CO2 30  --  27 25 28 30  32 26  GLUCOSE 92  --  90 107* 100* 130* 94 108*  BUN 24*  --  27* 24* 25* 31* 29* 24*  CREATININE 1.51*  --  1.51* 1.38* 1.52* 1.79* 1.80* 1.50*  CALCIUM 9.1  --  8.9 8.9 9.5 9.3 9.3 9.0  MG 2.1  --   2.4 2.3 2.0  --   --   --   < > = values in this interval not displayed.  Liver Function Tests: No results for input(s): AST, ALT, ALKPHOS, BILITOT, PROT, ALBUMIN in the last 168 hours. No results for input(s): LIPASE, AMYLASE in the last 168 hours. No results for input(s): AMMONIA in the last 168 hours.  CBC:  Recent Labs Lab 11/06/14 0740 11/10/14 0420  WBC 6.3 5.7  NEUTROABS 4.3  --   HGB 11.8* 11.8*  HCT 37.7 36.7  MCV 88.1 87.6  PLT 167 215    Cardiac Enzymes: No results for input(s): CKTOTAL, CKMB, CKMBINDEX, TROPONINI in the last 168 hours.  BNP: BNP (last 3 results) No results for input(s): BNP in the last 8760 hours.  ProBNP (last 3 results) No results for input(s): PROBNP in the last 8760 hours.    Other results:  Imaging: No results found.   Medications:     Scheduled Medications: . dofetilide  250 mcg Oral Q12H  . ezetimibe  10 mg Oral Q M,W,F  . fluticasone  2 spray Each Nare Daily  . furosemide  80 mg Intravenous BID  . levothyroxine  100  mcg Oral Q breakfast  . polyethylene glycol  17 g Oral Daily  . potassium chloride  40 mEq Oral Daily  . ranolazine  500 mg Oral BID  . sodium chloride  10-40 mL Intracatheter Q12H  . sodium chloride  3 mL Intravenous Q12H  . sodium chloride  3 mL Intravenous Q12H  . [START ON 11/13/2014] warfarin  2.5 mg Oral Once per day on Sun Tue Thu Sat  . warfarin  5 mg Oral Once per day on Mon Wed Fri  . Warfarin - Pharmacist Dosing Inpatient   Does not apply q1800    Infusions: . sodium chloride 10 mL/hr at 11/11/14 1600  . sodium chloride    . sodium chloride    . DOBUTamine 5 mcg/kg/min (11/11/14 1900)    PRN Medications: acetaminophen, albuterol, ALPRAZolam, fentaNYL (SUBLIMAZE) injection, ondansetron (ZOFRAN) IV, sodium chloride, sodium chloride, sodium chloride, traMADol, zolpidem   Assessment:   1. Acute on chronic systolic HF due to NICM EF 25% 2. Cardiogenic shock 3. PAF   - Tikosyn stopped  recently due to worsening CKD   - unable to tolerate amio due to n/v 4. CKD, stage 4 5. Hypothyroidism  Plan/Discussion:    Feels much better S/p DC-CV on 10/28. Remains in NSR on Tikosyn. Ranexa also added.   Continue dobutamine 5. Stable CO-OX . CVP up. Switch back to IV lasix.  Creatinine stable.   Probable d/c tomorrow. Home dobutamine tomorrow.   Length of Stay: [place] NP-C  11/12/2014, 9:28 AM Advanced Heart Failure Team Pager [phone] (M-F; 7a - 4p)  Please contact [facility] for night-coverage after hours (4p -7a ) and weekends on [email or web address]   Patient seen and examined with Darrick Grinder, NP. We discussed all aspects of the encounter. I agree with the assessment and plan as stated above.   Overall improved but CVP back up significantly after one day off lasix. Diurese with IV lasix today. Will try to get home tomorrow. Continue dobutamine. Remains in NSR on Tikosyn and Ranexa.   Hooper Pette[place],MD 10:33 AM

## 2014-11-12 NOTE — Significant Event (Signed)
Patient went back into A Fib, controlled HR in the 80s. The only symptom reported from patient is persistent nausea on and off which she has been experiencing all day. Dr. Caryl Comes at bedside; aware of A Fib. Will continue to monitor. Janeen Watson, Therapist, sports.

## 2014-11-12 NOTE — Progress Notes (Signed)
ANTICOAGULATION CONSULT NOTE - Follow Up Consult  Pharmacy Consult for Coumadin Indication: atrial fibrillation   Patient Measurements: Height: 5\' 3"  (160 cm) Weight: 143 lb 1.6 oz (64.91 kg) IBW/kg (Calculated) : 52.4  Vital Signs: Temp: 98.3 F (36.8 C) (10/31 0742) Temp Source: Oral (10/31 0742) BP: 128/80 mmHg (10/31 0742)  Labs:  Recent Labs  11/10/14 0420 11/11/14 0450 11/12/14 0530  HGB 11.8*  --   --   HCT 36.7  --   --   PLT 215  --   --   LABPROT 35.0* 30.1* 26.2*  INR 3.58* 2.94* 2.44*  CREATININE 1.79* 1.80* 1.50*    Estimated Creatinine Clearance: 27.1 mL/min (by C-G formula based on Cr of 1.5).  Assessment: 82yof continues on coumadin for afib.   INR at goal 2.44 but has fallen after holding dose 10/29 when INR> 3. No bleeding noted.  Will increase home dose slightly given requirements in hospital.   PTA coumadin dose: 5mg  MF / 2.5mg  AOD  Goal of Therapy:  INR 2-3 Monitor platelets by anticoagulation protocol: Yes   Plan:  Warfarin 5 mg MWF / 2.5mg  TTSS Daily INR Monitor s/sx bleeding    Bonnita Nasuti Pharm.D. CPP, BCPS Clinical Pharmacist [phone] 11/12/2014 9:39 AM

## 2014-11-12 NOTE — Progress Notes (Signed)
CARDIAC REHAB PHASE I   PRE:  Rate/Rhythm: 72 pacing    BP: sitting 132/99    SaO2:   MODE:  Ambulation: 350 ft   POST:  Rate/Rhythm: 85 pacing    BP: sitting 117/65     SaO2:   Pt still c/o nausea. Willing to walk and seemed to do ok. Sts her nausea did not change. No other c/o. Will continue to follow. [id number]   Josephina Shih [place]te CES, ACSM 11/12/2014 2:00 PM

## 2014-11-12 NOTE — Care Management Note (Addendum)
Case Management Note  Patient Details  Name: Sandra [name] MRN: [id number] Date of Birth: [birth year]  Subjective/Objective:                    Action/Plan: Met with patient to discuss discharge planning needs. Patient was previously on Tikosyn, which was discontinued approximately 3 weeks ago (per patient report).  Patient will be continuing it at discharge.  CM will follow up with patient to ensure that she has access to the Tikosyn at discharge. Patient has been set up with [facility] for home Dobutamine. Pam with [facility] is aware of probable discharge home tomorrow. CM will continue to follow for any additional discharge needs.  Expected Discharge Date:                  Expected Discharge Plan:  [name]  In-House Referral:     Discharge planning Services  CM Consult  Post Acute Care Choice:  Durable Medical Equipment, Home Health Choice offered to:  Patient  DME Arranged:  IV pump/equipment DME Agency:  [place]:  RN, Disease Management [place] Agency:  [place]  Status of Service:     Medicare Important Message Given:  Yes-fourth notification given Date Medicare IM Given:    Medicare IM give by:    Date Additional Medicare IM Given:    Additional Medicare Important Message give by:     If discussed at [place] of Stay Meetings, dates discussed:    Additional CommentsRolm Baptise, RN 11/12/2014, 3:54 PM [phone]

## 2014-11-13 ENCOUNTER — Encounter: Payer: Self-pay | Admitting: *Deleted

## 2014-11-13 LAB — PROTIME-INR
INR: 2.49 — AB (ref 0.00–1.49)
PROTHROMBIN TIME: 26.6 s — AB (ref 11.6–15.2)

## 2014-11-13 LAB — CARBOXYHEMOGLOBIN
CARBOXYHEMOGLOBIN: 1.4 % (ref 0.5–1.5)
METHEMOGLOBIN: 0.8 % (ref 0.0–1.5)
O2 SAT: 55.7 %
Total hemoglobin: 13 g/dL (ref 12.0–16.0)

## 2014-11-13 LAB — BASIC METABOLIC PANEL
Anion gap: 8 (ref 5–15)
BUN: 19 mg/dL (ref 6–20)
CALCIUM: 9.2 mg/dL (ref 8.9–10.3)
CO2: 28 mmol/L (ref 22–32)
Chloride: 97 mmol/L — ABNORMAL LOW (ref 101–111)
Creatinine, Ser: 1.67 mg/dL — ABNORMAL HIGH (ref 0.44–1.00)
GFR calc Af Amer: 32 mL/min — ABNORMAL LOW (ref >60)
GFR calc non Af Amer: 28 mL/min — ABNORMAL LOW (ref >60)
GLUCOSE: 113 mg/dL — AB (ref 65–99)
Potassium: 4.4 mmol/L (ref 3.5–5.1)
Sodium: 133 mmol/L — ABNORMAL LOW (ref 135–145)

## 2014-11-13 LAB — MAGNESIUM: Magnesium: 2 mg/dL (ref 1.7–2.4)

## 2014-11-13 MED ORDER — WARFARIN SODIUM 5 MG PO TABS: ORAL_TABLET | ORAL | Status: DC

## 2014-11-13 MED ORDER — DOBUTAMINE IN D5W 4-5 MG/ML-% IV SOLN: 5.0000 ug/kg/min | INTRAVENOUS | Status: DC

## 2014-11-13 MED ORDER — DOFETILIDE 250 MCG PO CAPS: 250.0000 ug | ORAL_CAPSULE | Freq: Two times a day (BID) | ORAL | Status: DC

## 2014-11-13 MED ORDER — ONDANSETRON HCL 4 MG PO TABS: 4.0000 mg | ORAL_TABLET | Freq: Two times a day (BID) | ORAL | Status: DC | PRN

## 2014-11-13 MED ORDER — TORSEMIDE 20 MG PO TABS: ORAL_TABLET | ORAL | Status: DC

## 2014-11-13 MED ORDER — ONDANSETRON HCL 4 MG PO TABS: 4.0000 mg | ORAL_TABLET | Freq: Two times a day (BID) | ORAL | Status: DC

## 2014-11-13 MED ORDER — RANOLAZINE ER 500 MG PO TB12: 500.0000 mg | ORAL_TABLET | Freq: Two times a day (BID) | ORAL | Status: DC

## 2014-11-13 MED ORDER — POTASSIUM CHLORIDE CRYS ER 20 MEQ PO TBCR: 40.0000 meq | EXTENDED_RELEASE_TABLET | Freq: Every day | ORAL | Status: DC

## 2014-11-13 NOTE — Progress Notes (Signed)
[facility]  Patient Status:New pt for [facility] this admission  [facility] is providing the following services: HHRN and [facility] [place] [facility] team for home Dobutamine. [facility] hospital coordinator providing in hospital teaching regarding [place] [facility] and home Milrinone to support transition to home.  [facility] RN will connect Ms. Slight to her home Milrinone pump today before DC.   If patient discharges after hours, please call [phone].   Larry Sierras 11/13/2014, 9:41 AM

## 2014-11-13 NOTE — Progress Notes (Signed)
CARDIAC REHAB PHASE I   PRE:  Rate/Rhythm: 70 afib pacing    BP: sitting 120/79    SaO2:   MODE:  Ambulation: 350 ft   POST:  Rate/Rhythm: 80 afib, less pacing    BP: sitting 118/72     SaO2:    Pt still feeling nauseated and weak today. Able to walk but got staggery toward end of walk, had her rest. Return to bed. Reviewed ed with pt and daughter, specifically low sodium and daily wts. Gave CRPII brochure for in the future if she feels better.  [phone]  Josephina Shih [place]reckels CES, ACSM 11/13/2014 11:36 AM

## 2014-11-13 NOTE — Discharge Summary (Signed)
Advanced Heart Failure Team  Discharge Summary   Patient ID: Sandra [name] MRN: [id number], DOB/AGE: 1932-11-11 81 y.o. Admit date: 10/29/2014 D/C date:     11/13/2014   Primary Discharge Diagnoses:  1. Acute on chronic systolic HF due to NICM EF 25% -Discharged on 5 mcg Dobutamine per PICC 2. Cardiogenic shock- [facility] 10/29/14  3. PAF  - Tikosyn reloaded this admit.  - unable to tolerate amio due to n/v -S/P DC-CV 10/28 but later back in A fib.  - INR 2.4 on coumadin  4. CKD, stage 4 5. Hypothyroidism 6. H/O St [employer] CRT-D   Hospital Course:  Sandra [name] is a 81 y.o. female with h/o PAF, hypothyroidism, St [employer] CRT-D CKD stage 4 and chronic systolic HF due to NICM with EF 25%. Prior to admit tikosyn was stopped due to worsening renal function. Functional decline correlated with recurrence of A fib.   Admitted after [place] with cardiogenic shock. Swan left in place to guide therapy but discontinued prior as she improved. Placed on milrinone and diuresed with IV lasix. Due to persistent low output she was switched from milrinone to dobutamine with improved hemodynamics. Unfortunately dobutamine was unable to be weaned. Once adequately diuresed she transitioned from IV lasix to torsemide 40 mg daily with an extra  20 mg of torsemide as needed.  Overall she diuresed 11 pounds. She will continue on dobutamine 5 mcg at discharge. No ace or bb with low output/CKD.   Recurrence of A fib seemed to correlate with worsening heart failure. Amiodarone was started but due to nausea and vomiting this was stopped. EP consulted for consideration of AVN ablation however this was deferred in favor to attempting to restore NSR with Tikosyn and repeat DC-CV. It was felt that with underlying RBBB benefit of AVN ablation and biv pacing might be less. Tikosyn was reloaded and she had successful cardioversion. Unfortunately she went back into A fib with controlled rate the day before d/c. Per EP, she will continue  tikosyn and ranexa and we will re-evaluate AF burden as outpatient. INR will continue to be followed at [facility].  She will continue to be followed closely in the HF clinic and EP clinic. [facility] will follow for home dobutamine and weekly lab work. INR will be sent to [facility] Clinic for ongoing coumadin dosing.    [place] 10/29/2014  RA = 19 RV = 70/9/19 PA = 69/31 (45) PCW = 27 Fick cardiac output/index = 2.3/1.4 Thermo cardiac output/index = 2.3/1.4 PVR = 7.8 WU Ao sat = 94% PA sat = 35%, 38%  Discharge Weight Range: 140 pounds  Discharge Vitals: Blood pressure 122/78, pulse 82, temperature 98.1 F (36.7 C), temperature source Oral, resp. rate 11, height 5\' 3"  (1.6 m), weight 140 lb 14.4 oz (63.912 kg), SpO2 95 %.  Labs: Lab Results  Component Value Date   WBC 5.7 11/10/2014   HGB 11.8* 11/10/2014   HCT 36.7 11/10/2014   MCV 87.6 11/10/2014   PLT 215 11/10/2014     Recent Labs Lab 11/13/14 0305  NA 133*  K 4.4  CL 97*  CO2 28  BUN 19  CREATININE 1.67*  CALCIUM 9.2  GLUCOSE 113*   Lab Results  Component Value Date   CHOL 231* 08/04/2011   HDL 42.30 08/04/2011   LDLCALC 124* 06/24/2009   TRIG 220.0* 08/04/2011   BNP (last 3 results) No results for input(s): BNP in the last 8760 hours.  ProBNP (last 3 results) No  results for input(s): PROBNP in the last 8760 hours.   Diagnostic Studies/Procedures   No results found.  Discharge Medications     Medication List    STOP taking these medications        benazepril 10 MG tablet  Commonly known as:  LOTENSIN     carvedilol 12.5 MG tablet  Commonly known as:  COREG     furosemide 40 MG tablet  Commonly known as:  LASIX      TAKE these medications        albuterol 108 (90 BASE) MCG/ACT inhaler  Commonly known as:  PROVENTIL HFA;VENTOLIN HFA  Inhale 2 puffs into the lungs every 6 (six) hours as needed for wheezing or shortness of breath.     b complex vitamins tablet  Take 1 tablet by mouth  every Monday, Wednesday, and Friday. Take one tablet by mouth three times a week     clorazepate 7.5 MG tablet  Commonly known as:  TRANXENE  Take 7.5 mg by mouth 2 (two) times daily as needed for anxiety (for anxiety).     DOBUTamine 4-5 MG/ML-% infusion  Commonly known as:  DOBUTREX  Inject 330 mcg/min into the vein continuous.     dofetilide 250 MCG capsule  Commonly known as:  TIKOSYN  Take 1 capsule (250 mcg total) by mouth every 12 (twelve) hours.     ezetimibe 10 MG tablet  Commonly known as:  ZETIA  Take 10 mg by mouth every Monday, Wednesday, and Friday.     fluticasone 50 MCG/ACT nasal spray  Commonly known as:  FLONASE  Place 2 sprays into both nostrils daily.     ondansetron 4 MG tablet  Commonly known as:  ZOFRAN  Take 1 tablet (4 mg total) by mouth 2 (two) times daily as needed for nausea or vomiting.     potassium chloride SA 20 MEQ tablet  Commonly known as:  K-DUR,KLOR-CON  Take 2 tablets (40 mEq total) by mouth daily.     ranolazine 500 MG 12 hr tablet  Commonly known as:  RANEXA  Take 1 tablet (500 mg total) by mouth 2 (two) times daily.     SYNTHROID 100 MCG tablet  Generic drug:  levothyroxine  TAKE ONE TABLET BY MOUTH ONCE DAILY BEFORE  BREAKFAST     torsemide 20 MG tablet  Commonly known as:  DEMADEX  Take 40 mg daily. Take an extra 20 mg of torsemide in afternoon as needed.     vitamin E 400 UNIT capsule  Take 400 Units by mouth daily.     warfarin 5 MG tablet  Commonly known as:  COUMADIN  Take 5 mg Mon Wed and Fri. Take 2.5 mg on all other days.        Disposition   The patient will be discharged in stable condition to home. Discharge Instructions    Contraindication to ACEI at discharge    Complete by:  As directed      Diet - low sodium heart healthy    Complete by:  As directed      Heart Failure patients record your daily weight using the same scale at the same time of day    Complete by:  As directed      Increase activity  slowly    Complete by:  As directed           Follow-up Information    Follow up with Glori Bickers, MD On 11/29/2014.   Specialty:  Cardiology   Why:  at 9:20  [place]   Contact information:   [place] [address] [phone]       Follow up with [facility].   Contact information:   [address] [place] [address] [phone]         Duration of Discharge Encounter: Greater than 35 minutes   Signed, Darrick Grinder NP-C  11/13/2014, 8:36 AM  Patient seen and examined with Darrick Grinder, NP. We discussed all aspects of the encounter. I agree with the assessment and plan as stated above.   I edited note with my changes.   Kjell Brannen,MD 12:25 PM

## 2014-11-13 NOTE — Discharge Instructions (Signed)
Heart Failure  Heart failure means your heart has trouble pumping blood. This makes it hard for your body to work well. Heart failure is usually a long-term (chronic) condition. You must take good care of yourself and follow your doctor's treatment plan.  HOME CARE   Take your heart medicine as told by your doctor.    Do not stop taking medicine unless your doctor tells you to.    Do not skip any dose of medicine.    Refill your medicines before they run out.    Take other medicines only as told by your doctor or pharmacist.   Stay active if told by your doctor. The elderly and people with severe heart failure should talk with a doctor about physical activity.   Eat heart-healthy foods. Choose foods that are without trans fat and are low in saturated fat, cholesterol, and salt (sodium). This includes fresh or frozen fruits and vegetables, fish, lean meats, fat-free or low-fat dairy foods, whole grains, and high-fiber foods. Lentils and dried peas and beans (legumes) are also good choices.   Limit salt if told by your doctor.   Cook in a healthy way. Roast, grill, broil, bake, poach, steam, or stir-fry foods.   Limit fluids as told by your doctor.   Weigh yourself every morning. Do this after you pee (urinate) and before you eat breakfast. Write down your weight to give to your doctor.   Take your blood pressure and write it down if your doctor tells you to.   Ask your doctor how to check your pulse. Check your pulse as told.   Lose weight if told by your doctor.   Stop smoking or chewing tobacco. Do not use gum or patches that help you quit without your doctor's approval.   Schedule and go to doctor visits as told.   Nonpregnant women should have no more than 1 drink a day. Men should have no more than 2 drinks a day. Talk to your doctor about drinking alcohol.   Stop illegal drug use.   Stay current with shots (immunizations).   Manage your health conditions as told by your doctor.   Learn to  manage your stress.   Rest when you are tired.   If it is really hot outside:    Avoid intense activities.    Use air conditioning or fans, or get in a cooler place.    Avoid caffeine and alcohol.    Wear loose-fitting, lightweight, and light-colored clothing.   If it is really cold outside:    Avoid intense activities.    Layer your clothing.    Wear mittens or gloves, a hat, and a scarf when going outside.    Avoid alcohol.   Learn about heart failure and get support as needed.   Get help to maintain or improve your quality of life and your ability to care for yourself as needed.  GET HELP IF:    You gain weight quickly.   You are more short of breath than usual.   You cannot do your normal activities.   You tire easily.   You cough more than normal, especially with activity.   You have any or more puffiness (swelling) in areas such as your hands, feet, ankles, or belly (abdomen).   You cannot sleep because it is hard to breathe.   You feel like your heart is beating fast (palpitations).   You get dizzy or light-headed when you stand up.  GET HELP   RIGHT AWAY IF:    You have trouble breathing.   There is a change in mental status, such as becoming less alert or not being able to focus.   You have chest pain or discomfort.   You faint.  MAKE SURE YOU:    Understand these instructions.   Will watch your condition.   Will get help right away if you are not doing well or get worse.     This information is not intended to replace advice given to you by your health care provider. Make sure you discuss any questions you have with your health care provider.     Document Released: 10/08/2007 Document Revised: 01/19/2014 Document Reviewed: 02/15/2012  Elsevier Interactive Patient Education 2016 [employer]

## 2014-11-13 NOTE — Care Management Note (Signed)
Case Management Note  Patient Details  Name: Sandra [name] MRN: [id number] Date of Birth: March 10, 1932  Subjective/Objective:                    Action/Plan: Patient is discharging home today with home Dobutimine with [facility].  Patient states that she already has Tikosyn at home, as she was on it recently.  She also reports having one refill left from her previous prescription.  This information was verified with Amy, NP with the Heart Failure Team.  No additional discharge needs identified at this time.  Expected Discharge Date:                  Expected Discharge Plan:  [place]  In-House Referral:     Discharge planning Services  CM Consult  Post Acute Care Choice:  Durable Medical Equipment, Home Health Choice offered to:  Patient  DME Arranged:  IV pump/equipment DME Agency:  [place]:  RN, Disease Management [place] Agency:  [place]  Status of Service:  Completed, signed off  Medicare Important Message Given:  Yes-fourth notification given Date Medicare IM Given:    Medicare IM give by:    Date Additional Medicare IM Given:    Additional Medicare Important Message give by:     If discussed at [place] of Stay Meetings, dates discussed:    Additional Comments:  Rolm Baptise, RN 11/13/2014, 9:44 AM

## 2014-11-13 NOTE — Progress Notes (Signed)
Advanced Heart Failure Rounding Note   Subjective:    S/p DC/CV on 10/28. Went back into AF last night despite Tikosyn and Ranexa.   Continues Dobutamine 5. Renal function stable. Co-ox 56% CVP 16  Denies SOB.Feels good.   Objective:   Weight Range:  Vital Signs:   Temp:  [97.2 F (36.2 C)-98.6 F (37 C)] 98.6 F (37 C) (11/01 0325) Pulse Rate:  [82-92] 82 (11/01 0325) Resp:  [11-18] 11 (10/31 1938) BP: (110-131)/(69-80) 122/78 mmHg (11/01 0325) SpO2:  [95 %-98 %] 95 % (11/01 0325) Weight:  [63.912 kg (140 lb 14.4 oz)] 63.912 kg (140 lb 14.4 oz) (11/01 0325) Last BM Date: 11/06/14  Weight change: Filed Weights   11/11/14 0304 11/12/14 0411 11/13/14 0325  Weight: 63.8 kg (140 lb 10.5 oz) 64.91 kg (143 lb 1.6 oz) 63.912 kg (140 lb 14.4 oz)    Intake/Output:   Intake/Output Summary (Last 24 hours) at 11/13/14 0522 Last data filed at 11/13/14 0500  Gross per 24 hour  Intake    790 ml  Output   2125 ml  Net  -1335 ml     Physical Exam: CVP 11 GEN- NAD . Lying in bed NAD HEENT: normal Neck.  Supple. JVP ~11 Lungs- Clear to ausculation bilaterally, normal work of breathing. No wheezes, rales, rhonchi Heart- PMI laterally displaced. Irregular  GI- soft, non-tender, non-distended, bowel sounds present  Extremities- no clubbing, cyanosis, trace edema; DP/PT/radial pulses 1+ bilaterally MS- no significant deformity or atrophy Skin- warm and dry, no rash or lesion; ICD pocket well healed Neuro- Alert and oriented x3.   Telemetry:  AF  Labs: Basic Metabolic Panel:  Recent Labs Lab 11/07/14 0455 11/08/14 0233 11/09/14 0320 11/10/14 0420 11/11/14 0450 11/12/14 0530 11/13/14 0305  NA 133* 131* 131* 131* 133* 132* 133*  K 4.5 4.5 3.6 3.3* 3.4* 3.8 4.4  CL 98* 98* 92* 88* 89* 95* 97*  CO2 27 25 28 30  32 26 28  GLUCOSE 90 107* 100* 130* 94 108* 113*  BUN 27* 24* 25* 31* 29* 24* 19  CREATININE 1.51* 1.38* 1.52* 1.79* 1.80* 1.50* 1.67*  CALCIUM 8.9 8.9  9.5 9.3 9.3 9.0 9.2  MG 2.4 2.3 2.0  --   --   --  2.0    Liver Function Tests: No results for input(s): AST, ALT, ALKPHOS, BILITOT, PROT, ALBUMIN in the last 168 hours. No results for input(s): LIPASE, AMYLASE in the last 168 hours. No results for input(s): AMMONIA in the last 168 hours.  CBC:  Recent Labs Lab 11/06/14 0740 11/10/14 0420  WBC 6.3 5.7  NEUTROABS 4.3  --   HGB 11.8* 11.8*  HCT 37.7 36.7  MCV 88.1 87.6  PLT 167 215    Cardiac Enzymes: No results for input(s): CKTOTAL, CKMB, CKMBINDEX, TROPONINI in the last 168 hours.  BNP: BNP (last 3 results) No results for input(s): BNP in the last 8760 hours.  ProBNP (last 3 results) No results for input(s): PROBNP in the last 8760 hours.    Other results:  Imaging: No results found.   Medications:     Scheduled Medications: . dofetilide  250 mcg Oral Q12H  . ezetimibe  10 mg Oral Q M,W,F  . fluticasone  2 spray Each Nare Daily  . furosemide  80 mg Intravenous BID  . levothyroxine  100 mcg Oral Q breakfast  . polyethylene glycol  17 g Oral Daily  . potassium chloride  40 mEq Oral Daily  .  ranolazine  500 mg Oral BID  . sodium chloride  10-40 mL Intracatheter Q12H  . warfarin  2.5 mg Oral Once per day on Sun Tue Thu Sat  . warfarin  5 mg Oral Once per day on Mon Wed Fri  . Warfarin - Pharmacist Dosing Inpatient   Does not apply q1800    Infusions: . sodium chloride 10 mL/hr at 11/11/14 1600  . DOBUTamine 5 mcg/kg/min (11/12/14 1738)    PRN Medications: acetaminophen, albuterol, ALPRAZolam, fentaNYL (SUBLIMAZE) injection, ondansetron (ZOFRAN) IV, sodium chloride, traMADol, zolpidem   Assessment:   1. Acute on chronic systolic HF due to NICM EF 25% 2. Cardiogenic shock 3. PAF   - Tikosyn stopped recently due to worsening CKD   - unable to tolerate amio due to n/v 4. CKD, stage 4 5. Hypothyroidism  Plan/Discussion:     S/p DC-CV on 10/28. But now back in AF despite Tikosyn and Ranexa. Rate  is controlled in 70-80. She feels well.  I discussed options with Dr. Caryl Comes and for now we feel best option is to let her go home on current regimen and see if she will chemically convert and what percentage of the time she will be in AF vs NSR. Will continue dobutamine.   Give one more dose IV lasix today. Would send home on torsemide 40 daily +/- 20mg  in afternoon as needed.   F/u next week in HF clinic and with Dr. Caryl Comes.     Length of Stay: 15 Glori Bickers MD 11/13/2014, 5:22 AM Advanced Heart Failure Team Pager [phone] (M-F; 7a - 4p)  Please contact [facility] for night-coverage after hours (4p -7a ) and weekends on [email or web address]

## 2014-11-14 ENCOUNTER — Emergency Department (HOSPITAL_BASED_OUTPATIENT_CLINIC_OR_DEPARTMENT_OTHER)
Admission: EM | Admit: 2014-11-14 | Discharge: 2014-11-14 | Disposition: A | Discharge status: Home / Self Care | Payer: Medicare Other | Attending: Emergency Medicine | Admitting: Emergency Medicine

## 2014-11-14 ENCOUNTER — Encounter (HOSPITAL_BASED_OUTPATIENT_CLINIC_OR_DEPARTMENT_OTHER): Payer: Self-pay | Admitting: *Deleted

## 2014-11-14 ENCOUNTER — Telehealth (HOSPITAL_COMMUNITY): Payer: Self-pay | Admitting: *Deleted

## 2014-11-14 ENCOUNTER — Telehealth: Payer: Self-pay | Admitting: Family Medicine

## 2014-11-14 ENCOUNTER — Emergency Department (HOSPITAL_BASED_OUTPATIENT_CLINIC_OR_DEPARTMENT_OTHER): Payer: Medicare Other

## 2014-11-14 DIAGNOSIS — W01198A Fall on same level from slipping, tripping and stumbling with subsequent striking against other object, initial encounter: Secondary | ICD-10-CM | POA: Diagnosis not present

## 2014-11-14 DIAGNOSIS — Z79899 Other long term (current) drug therapy: Secondary | ICD-10-CM | POA: Diagnosis not present

## 2014-11-14 DIAGNOSIS — Y9289 Other specified places as the place of occurrence of the external cause: Secondary | ICD-10-CM | POA: Insufficient documentation

## 2014-11-14 DIAGNOSIS — I5020 Unspecified systolic (congestive) heart failure: Secondary | ICD-10-CM | POA: Diagnosis not present

## 2014-11-14 DIAGNOSIS — S0990XA Unspecified injury of head, initial encounter: Secondary | ICD-10-CM | POA: Diagnosis present

## 2014-11-14 DIAGNOSIS — Y9389 Activity, other specified: Secondary | ICD-10-CM | POA: Insufficient documentation

## 2014-11-14 DIAGNOSIS — E785 Hyperlipidemia, unspecified: Secondary | ICD-10-CM | POA: Insufficient documentation

## 2014-11-14 DIAGNOSIS — E039 Hypothyroidism, unspecified: Secondary | ICD-10-CM | POA: Diagnosis not present

## 2014-11-14 DIAGNOSIS — Z7901 Long term (current) use of anticoagulants: Secondary | ICD-10-CM | POA: Insufficient documentation

## 2014-11-14 DIAGNOSIS — Y998 Other external cause status: Secondary | ICD-10-CM | POA: Insufficient documentation

## 2014-11-14 DIAGNOSIS — N183 Chronic kidney disease, stage 3 (moderate): Secondary | ICD-10-CM | POA: Insufficient documentation

## 2014-11-14 DIAGNOSIS — Z95810 Presence of automatic (implantable) cardiac defibrillator: Secondary | ICD-10-CM | POA: Insufficient documentation

## 2014-11-14 DIAGNOSIS — Z9889 Other specified postprocedural states: Secondary | ICD-10-CM | POA: Diagnosis not present

## 2014-11-14 DIAGNOSIS — Z880 Allergy status to penicillin: Secondary | ICD-10-CM | POA: Insufficient documentation

## 2014-11-14 DIAGNOSIS — I482 Chronic atrial fibrillation: Secondary | ICD-10-CM | POA: Diagnosis not present

## 2014-11-14 DIAGNOSIS — S0083XA Contusion of other part of head, initial encounter: Secondary | ICD-10-CM | POA: Insufficient documentation

## 2014-11-14 DIAGNOSIS — W19XXXA Unspecified fall, initial encounter: Secondary | ICD-10-CM

## 2014-11-14 DIAGNOSIS — I129 Hypertensive chronic kidney disease with stage 1 through stage 4 chronic kidney disease, or unspecified chronic kidney disease: Secondary | ICD-10-CM | POA: Diagnosis not present

## 2014-11-14 DIAGNOSIS — Z86010 Personal history of colonic polyps: Secondary | ICD-10-CM | POA: Diagnosis not present

## 2014-11-14 DIAGNOSIS — Z87891 Personal history of nicotine dependence: Secondary | ICD-10-CM | POA: Diagnosis not present

## 2014-11-14 DIAGNOSIS — Z7951 Long term (current) use of inhaled steroids: Secondary | ICD-10-CM | POA: Insufficient documentation

## 2014-11-14 DIAGNOSIS — Z8719 Personal history of other diseases of the digestive system: Secondary | ICD-10-CM | POA: Insufficient documentation

## 2014-11-14 NOTE — Telephone Encounter (Signed)
RN w/[facility] called, she is there to see pt now and states pt has a bruise to the right side of her forehead from a fall last night.  She is concerned as pt is on coumadin.  Discussed w/Dr Bensimhon he would like pt to go to ER for eval and ct scan, she will notify pt

## 2014-11-14 NOTE — Telephone Encounter (Signed)
Patient is on Coumadin and Dobutamine drip. Sandra [name] from [facility] states that the patient fell during the night.  She came home from the hospital yesterday.  She has a bruise on her forehead and a scratch on her right cheek.  Sandra [name] is wanting to know if she needs to go get an x-ray.  Patient did not lose consciousness.  Sandra [name] can be reached at [phone].

## 2014-11-14 NOTE — ED Notes (Signed)
Nurse first-pt ambulated into ED WR with steady gait on the arm of daughter-pt refused w/c-stated c/o is a trip/fall-advised of need to have pt in w/c as a safety precaution-pt agreed and was placed in w/c to await triage

## 2014-11-14 NOTE — ED Provider Notes (Signed)
CSN: [id number]     Arrival date & time 11/14/14  1604 History   First MD Initiated Contact with Patient 11/14/14 1653     Chief Complaint  Patient presents with  . Fall     (Consider location/radiation/quality/duration/timing/severity/associated sxs/prior Treatment) HPI  81 year old on Coumadin who fell because she didn't take a step and hit her head at 4:30 this morning. Dr. Mamie Nick speed about it this afternoon and told to come here for evaluation. Does not have a headache just has a bruise on the side of her head. No other complaints at this time. No loss of consciousness. No pain in her arms chest or abdomen. She has fallen before. She just recently was discharged from the hospital.  Past Medical History  Diagnosis Date  . Atrial fibrillation ([place]) permanent     on Tikosyn and Coumadin  . Hyperlipidemia   . Hypothyroidism   . Bradycardia   . Cardiomyopathy, hypertrophic nonobstructive (HCC)     ejection fraction 25%  . Anal fissure   . Adenomatous colon polyp   . Diverticulosis   . ICD-CRT     generator change 2013  . Systolic heart failure   . History of colonoscopy   . Migraines   . Hyperlipidemia   . Hypertension   . Chronic renal insufficiency, stage III (moderate)     gd 3-4  . RBBB plus LA hemiblock    Past Surgical History  Procedure Laterality Date  . Cardioversion    . Cardiac defibrillator placement  2007  . Vaginal hysterectomy      uterus alone  . Ovarian cyst removal  1989    benign  . Biv icd genertaor change out N/A 03/23/2011    Procedure: BIV ICD GENERTAOR CHANGE OUT;  Surgeon: Deboraha Sprang, MD;  Location: [facility] CATH LAB;  Service: Cardiovascular;  Laterality: N/A;  . Colonoscopy    . Cardiac catheterization N/A 10/29/2014    Procedure: Right Heart Cath;  Surgeon: Jolaine Artist, MD;  Location: [facility] CV LAB;  Service: Cardiovascular;  Laterality: N/A;  . Cardioversion N/A 11/09/2014    Procedure: CARDIOVERSION;  Surgeon: Jolaine Artist,  MD;  Location: [facility] ENDOSCOPY;  Service: Cardiovascular;  Laterality: N/A;   Family History  Problem Relation Age of Onset  . Heart disease Mother   . Colon cancer Father   . COPD Father   . Heart disease Father   . Cardiomyopathy Daughter   . Breast cancer Paternal Aunt   . Colon polyps Father   . Diabetes Neg Hx   . Kidney disease Neg Hx    Social History  Substance Use Topics  . Smoking status: Former Smoker -- 0.20 packs/day for 19 years    Types: Cigarettes    Quit date: 01/13/1987  . Smokeless tobacco: Never Used  . Alcohol Use: No   OB History    No data available     Review of Systems  Constitutional: Negative for fever and chills.  HENT:       Bruise on the right side of her head.  Eyes: Negative for photophobia and pain.  Respiratory: Negative for cough and shortness of breath.   All other systems reviewed and are negative.     Allergies  Levofloxacin; Procaine hcl; Spironolactone; Amiodarone; Amoxicillin; Codeine; Niacin; Ramipril; and Statins  Home Medications   Prior to Admission medications   Medication Sig Start Date End Date Taking? Authorizing Provider  albuterol (PROVENTIL HFA;VENTOLIN HFA) 108 (90 BASE) MCG/ACT inhaler  Inhale 2 puffs into the lungs every 6 (six) hours as needed for wheezing or shortness of breath. 10/02/13   Marin Olp, MD  b complex vitamins tablet Take 1 tablet by mouth every Monday, Wednesday, and Friday. Take one tablet by mouth three times a week    Historical Provider, MD  clorazepate (TRANXENE) 7.5 MG tablet Take 7.5 mg by mouth 2 (two) times daily as needed for anxiety (for anxiety).    Historical Provider, MD  DOBUTamine (DOBUTREX) 4-5 MG/ML-% infusion Inject 330 mcg/min into the vein continuous. 11/13/14   Amy D Ninfa Meeker, NP  dofetilide (TIKOSYN) 250 MCG capsule Take 1 capsule (250 mcg total) by mouth every 12 (twelve) hours. 11/13/14   Amy D Ninfa Meeker, NP  ezetimibe (ZETIA) 10 MG tablet Take 10 mg by mouth every Monday,  Wednesday, and Friday.     Historical Provider, MD  fluticasone (FLONASE) 50 MCG/ACT nasal spray Place 2 sprays into both nostrils daily. 09/27/14   Marin Olp, MD  ondansetron (ZOFRAN) 4 MG tablet Take 1 tablet (4 mg total) by mouth 2 (two) times daily as needed for nausea or vomiting. 11/13/14   Amy D Clegg, NP  potassium chloride SA (K-DUR,KLOR-CON) 20 MEQ tablet Take 2 tablets (40 mEq total) by mouth daily. 11/13/14   Amy D Ninfa Meeker, NP  ranolazine (RANEXA) 500 MG 12 hr tablet Take 1 tablet (500 mg total) by mouth 2 (two) times daily. 11/13/14   Amy D Ninfa Meeker, NP  SYNTHROID 100 MCG tablet TAKE ONE TABLET BY MOUTH ONCE DAILY BEFORE  BREAKFAST Patient taking differently: TAKE ONE TABLET BY MOUTH ONCE DAILY AT BEDTIME 08/04/14   Marin Olp, MD  torsemide (DEMADEX) 20 MG tablet Take 40 mg daily. Take an extra 20 mg of torsemide in afternoon as needed. 11/13/14   Amy D Ninfa Meeker, NP  vitamin E 400 UNIT capsule Take 400 Units by mouth daily.     Historical Provider, MD  warfarin (COUMADIN) 5 MG tablet Take 5 mg Mon Wed and Fri. Take 2.5 mg on all other days. 11/13/14   Amy D Clegg, NP   BP 130/99 mmHg  Pulse 80  Temp(Src) 97.9 F (36.6 C) (Oral)  Resp 20  Ht 5\' 3"  (1.6 m)  Wt 140 lb (63.504 kg)  BMI 24.81 kg/m2  SpO2 97% Physical Exam  Constitutional: She is oriented to person, place, and time. She appears well-developed and well-nourished.  HENT:  Head: Normocephalic and atraumatic.  Eyes:  Right temple ecchymosis without eye involvement, normal EOM.  Neck: Normal range of motion.  Cardiovascular: Normal rate and regular rhythm.   Pulmonary/Chest: No stridor. No respiratory distress.  Abdominal: She exhibits no distension.  Neurological: She is alert and oriented to person, place, and time.  No altered mental status, able to give full seemingly accurate history.  Face is symmetric, EOM's intact, pupils equal and reactive, vision intact, tongue and uvula midline without deviation Upper and  Lower extremity motor 5/5, intact pain perception in distal extremities, 2+ reflexes in biceps, patella and achilles tendons. Finger to nose normal, heel to shin normal.  Nursing note and vitals reviewed.   ED Course  Procedures (including critical care time) Labs Review Labs Reviewed - No data to display  Imaging Review Ct Head Wo Contrast  11/14/2014  CLINICAL DATA:  81 year old female with history of trauma from a fall this morning. Injury to her head on a door, with bruising to the right supraorbital region. EXAM: CT HEAD WITHOUT CONTRAST  CT MAXILLOFACIAL WITHOUT CONTRAST TECHNIQUE: Multidetector CT imaging of the head and maxillofacial structures were performed using the standard protocol without intravenous contrast. Multiplanar CT image reconstructions of the maxillofacial structures were also generated. COMPARISON:  No priors. FINDINGS: CT HEAD FINDINGS Mild cerebral atrophy. Patchy and confluent areas of decreased attenuation are noted throughout the deep and periventricular white matter of the cerebral hemispheres bilaterally, compatible with chronic microvascular ischemic disease. No acute displaced skull fractures are identified. No acute intracranial abnormality. Specifically, no evidence of acute post-traumatic intracranial hemorrhage, no definite regions of acute/subacute cerebral ischemia, no focal mass, mass effect, hydrocephalus or abnormal intra or extra-axial fluid collections. The visualized paranasal sinuses and mastoids are well pneumatized. CT MAXILLOFACIAL FINDINGS No acute displaced facial bone fractures. Specifically, pterygoid plates are intact. The mandible is intact, and mandibular condyles are located bilaterally. Bilateral globes and retro-orbital soft tissues are grossly normal. IMPRESSION: 1. No evidence of significant acute traumatic skull, brain or facial bones. 2. Mild cerebral atrophy with mild chronic microvascular ischemic changes in the cerebral white matter.  Electronically Signed   By: Vinnie Langton M.D.   On: 11/14/2014 18:23   Ct Maxillofacial Wo Cm  11/14/2014  CLINICAL DATA:  81 year old female with history of trauma from a fall this morning. Injury to her head on a door, with bruising to the right supraorbital region. EXAM: CT HEAD WITHOUT CONTRAST CT MAXILLOFACIAL WITHOUT CONTRAST TECHNIQUE: Multidetector CT imaging of the head and maxillofacial structures were performed using the standard protocol without intravenous contrast. Multiplanar CT image reconstructions of the maxillofacial structures were also generated. COMPARISON:  No priors. FINDINGS: CT HEAD FINDINGS Mild cerebral atrophy. Patchy and confluent areas of decreased attenuation are noted throughout the deep and periventricular white matter of the cerebral hemispheres bilaterally, compatible with chronic microvascular ischemic disease. No acute displaced skull fractures are identified. No acute intracranial abnormality. Specifically, no evidence of acute post-traumatic intracranial hemorrhage, no definite regions of acute/subacute cerebral ischemia, no focal mass, mass effect, hydrocephalus or abnormal intra or extra-axial fluid collections. The visualized paranasal sinuses and mastoids are well pneumatized. CT MAXILLOFACIAL FINDINGS No acute displaced facial bone fractures. Specifically, pterygoid plates are intact. The mandible is intact, and mandibular condyles are located bilaterally. Bilateral globes and retro-orbital soft tissues are grossly normal. IMPRESSION: 1. No evidence of significant acute traumatic skull, brain or facial bones. 2. Mild cerebral atrophy with mild chronic microvascular ischemic changes in the cerebral white matter. Electronically Signed   By: Vinnie Langton M.D.   On: 11/14/2014 18:23   I have personally reviewed and evaluated these images and lab results as part of my medical decision-making.   EKG Interpretation None      MDM   Final diagnoses:  Fall    Fell 4:30 this morning and is on Coumadin as a bruising to the side of her face and CT head and face were done without any abnormalities. Normal neuro exam. No symptoms this time. Will follow-up with doctor as needed. Return here for any new or worsening symptoms. Discussed possible delayed bleeds with family.  I have personally and contemperaneously reviewed labs and imaging and used in my decision making as above.   A medical screening exam was performed and I feel the patient has had an appropriate workup for their chief complaint at this time and likelihood of emergent condition existing is low. They have been counseled on decision, discharge, follow up and which symptoms necessitate immediate return to the emergency department. They or their family  verbally stated understanding and agreement with plan and discharged in stable condition.      Merrily Pew, MD 11/15/14 [phone]

## 2014-11-14 NOTE — ED Notes (Signed)
Pt to triage in w/c, reports trip and fall this am hitting her head. No loc, denies any other injury or c/o. Pt called her pcp and due to taking coumadin, was told to come to ed for ct scan.

## 2014-11-15 ENCOUNTER — Other Ambulatory Visit: Payer: Self-pay

## 2014-11-15 NOTE — Patient Outreach (Signed)
Subjective Sandra [name] is an 81 yo with a recent hospitalization for acute on chronic systolic heart failure.  I identified patient as eligible for pharmacy transition of care project.  Medication review completed.  I called patient and read consent.  Patient agreed to participate in transition of care project.    I reviewed all medications with the patient.  Patient reports adherence with medications.    Patient reports weighing daily as instructed.  She has a home health nurse who is visiting her three times a week.  The first visit was yesterday.  She denies shortness or breath and denies swelling.    Patient has hospital follow up scheduled for 11/29/14 with Dr. Haroldine [name].  She was placed on a Dobutamine drip and states she is doing very well with it.  She did report a fall and did go to the ER.  She had a CT scan which was negative.  She stated she feels very good.   Objective:   Outpatient Encounter Prescriptions as of 11/15/2014  Medication Sig Note  . albuterol (PROVENTIL HFA;VENTOLIN HFA) 108 (90 BASE) MCG/ACT inhaler Inhale 2 puffs into the lungs every 6 (six) hours as needed for wheezing or shortness of breath.   Marland Kitchen b complex vitamins tablet Take 1 tablet by mouth every Monday, Wednesday, and Friday. Take one tablet by mouth three times a week   . clorazepate (TRANXENE) 7.5 MG tablet Take 7.5 mg by mouth 2 (two) times daily as needed for anxiety (for anxiety). 11/15/2014: Typically take 1/2 tablet a day  . DOBUTamine (DOBUTREX) 4-5 MG/ML-% infusion Inject 330 mcg/min into the vein continuous.   Marland Kitchen dofetilide (TIKOSYN) 250 MCG capsule Take 1 capsule (250 mcg total) by mouth every 12 (twelve) hours.   Marland Kitchen ezetimibe (ZETIA) 10 MG tablet Take 10 mg by mouth every Monday, Wednesday, and Friday.    . fluticasone (FLONASE) 50 MCG/ACT nasal spray Place 2 sprays into both nostrils daily.   . ondansetron (ZOFRAN) 4 MG tablet Take 1 tablet (4 mg total) by mouth 2 (two) times daily as needed for nausea or  vomiting.   . potassium chloride SA (K-DUR,KLOR-CON) 20 MEQ tablet Take 2 tablets (40 mEq total) by mouth daily.   . ranolazine (RANEXA) 500 MG 12 hr tablet Take 1 tablet (500 mg total) by mouth 2 (two) times daily.   Marland Kitchen SYNTHROID 100 MCG tablet TAKE ONE TABLET BY MOUTH ONCE DAILY BEFORE  BREAKFAST (Patient taking differently: TAKE ONE TABLET BY MOUTH ONCE DAILY AT BEDTIME)   . torsemide (DEMADEX) 20 MG tablet Take 40 mg daily. Take an extra 20 mg of torsemide in afternoon as needed.   . vitamin E 400 UNIT capsule Take 400 Units by mouth daily.    Marland Kitchen warfarin (COUMADIN) 5 MG tablet Take 5 mg Mon Wed and Fri. Take 2.5 mg on all other days.    No facility-administered encounter medications on file as of 11/15/2014.   Assessment:  Drugs sorted by system:  Neurologic/Psychologic:  Clorazepate  Cardiovascular: dobutamine, dofetilide, ezetimibe, ranolazine, torsemide, warfarin, potassium  Pulmonary/Allergy: Ventolin, fluticasone nasal spray  Gastrointestinal: Ondansetron  Endocrine: Levothyroxine  Renal: none  Topical: none  Pain: none  Vitamins/Minerals: Vitamin B Complex, Vitamin E  Infectious Diseases: none  Miscellaneous:   Duplications in therapy: none Gaps in therapy: none Medications to avoid in the elderly: cloazepate Drug interactions: no major drug interactions noted Other issues noted: nausea; unsure when INR is supposed to be rechecked  Plan:  1.  Ms.  [name] has a very good understanding of her medications.  Since starting Dobutamine, she is feeling much better.  2.  She is complaining of nausea which she thought may be due to potassium.  She is now taking with food.  Ranexa may contribute to her nausea (<4%).  I will send an inbox message to Sandra [name], PharmD, [facility] Pharmacist alerting to discuss with the patient during her follow up visit.  3.  Her INR has not been checked since she was discharged from the hospital.  [facility] is  supposed to check it weekly and send to Dr. Ansel [name] office.  I instructed Sandra [name] to call Dr. Ansel [name] office if home health did not check it on Friday, November 16, 2014.   4.  Sandra [name] has good support and I told her if she had further questions she could reach out to me.    Sandra [name], PharmD, [place] [phone]

## 2014-11-15 NOTE — Telephone Encounter (Signed)
See below

## 2014-11-15 NOTE — Telephone Encounter (Signed)
Patient had CT of her head last night- no x-ray of head needed as that study is far superior. If she has new or worsening symptoms advise her to be seen in office

## 2014-11-15 NOTE — Telephone Encounter (Signed)
Malachy Mood notified of below message

## 2014-11-19 ENCOUNTER — Ambulatory Visit (INDEPENDENT_AMBULATORY_CARE_PROVIDER_SITE_OTHER): Payer: Medicare Other | Admitting: Internal Medicine

## 2014-11-19 ENCOUNTER — Encounter: Payer: Self-pay | Admitting: Internal Medicine

## 2014-11-19 ENCOUNTER — Encounter: Payer: Self-pay | Admitting: *Deleted

## 2014-11-19 VITALS — BP 108/66 | HR 70 | Ht 63.0 in | Wt 141.2 lb

## 2014-11-19 DIAGNOSIS — I5023 Acute on chronic systolic (congestive) heart failure: Secondary | ICD-10-CM

## 2014-11-19 DIAGNOSIS — I421 Obstructive hypertrophic cardiomyopathy: Secondary | ICD-10-CM

## 2014-11-19 DIAGNOSIS — Z95810 Presence of automatic (implantable) cardiac defibrillator: Secondary | ICD-10-CM | POA: Diagnosis not present

## 2014-11-19 LAB — CUP PACEART INCLINIC DEVICE CHECK
Battery Remaining Longevity: 44.4
Brady Statistic RA Percent Paced: 22 %
Brady Statistic RV Percent Paced: 65 %
Date Time Interrogation Session: 20161107191650
HighPow Impedance: 47.4243
Implantable Lead Implant Date: 20071101
Implantable Lead Implant Date: 20071101
Implantable Lead Implant Date: 20071101
Implantable Lead Location: 753858
Implantable Lead Location: 753859
Implantable Lead Location: 753860
Implantable Lead Model: 4538
Implantable Lead Serial Number: 166326
Lead Channel Impedance Value: 375 Ohm
Lead Channel Impedance Value: 437.5 Ohm
Lead Channel Impedance Value: 562.5 Ohm
Lead Channel Pacing Threshold Amplitude: 0.5 V
Lead Channel Pacing Threshold Amplitude: 0.75 V
Lead Channel Pacing Threshold Pulse Width: 0.5 ms
Lead Channel Pacing Threshold Pulse Width: 0.5 ms
Lead Channel Sensing Intrinsic Amplitude: 0.9 mV
Lead Channel Sensing Intrinsic Amplitude: 12 mV
Lead Channel Setting Pacing Amplitude: 2 V
Lead Channel Setting Pacing Amplitude: 2 V
Lead Channel Setting Pacing Amplitude: 2.125
Lead Channel Setting Pacing Pulse Width: 0.5 ms
Lead Channel Setting Pacing Pulse Width: 0.5 ms
Lead Channel Setting Sensing Sensitivity: 0.5 mV
Pulse Gen Serial Number: 7032457

## 2014-11-19 LAB — CBC AND DIFFERENTIAL
HEMATOCRIT: 39 % (ref 36–46)
Hemoglobin: 13.3 g/dL (ref 12.0–16.0)
PLATELETS: 297 10*3/uL (ref 150–399)
WBC: 8.6 10^3/mL

## 2014-11-19 LAB — CBC WITH DIFFERENTIAL/PLATELET
BASOS ABS: 0 10*3/uL (ref 0.0–0.1)
Basophils Relative: 0 % (ref 0–1)
Eosinophils Absolute: 1.4 10*3/uL — ABNORMAL HIGH (ref 0.0–0.7)
Eosinophils Relative: 13 % — ABNORMAL HIGH (ref 0–5)
HEMATOCRIT: 41 % (ref 36.0–46.0)
Hemoglobin: 13.7 g/dL (ref 12.0–15.0)
LYMPHS ABS: 0.7 10*3/uL (ref 0.7–4.0)
LYMPHS PCT: 7 % — AB (ref 12–46)
MCH: 27.7 pg (ref 26.0–34.0)
MCHC: 33.4 g/dL (ref 30.0–36.0)
MCV: 83 fL (ref 78.0–100.0)
MONOS PCT: 6 % (ref 3–12)
MPV: 10.9 fL (ref 8.6–12.4)
Monocytes Absolute: 0.6 10*3/uL (ref 0.1–1.0)
NEUTROS PCT: 74 % (ref 43–77)
Neutro Abs: 7.7 10*3/uL (ref 1.7–7.7)
Platelets: 298 10*3/uL (ref 150–400)
RBC: 4.94 MIL/uL (ref 3.87–5.11)
RDW: 16.4 % — ABNORMAL HIGH (ref 11.5–15.5)
WBC: 10.4 10*3/uL (ref 4.0–10.5)

## 2014-11-19 LAB — BASIC METABOLIC PANEL
BUN: 29 mg/dL — AB (ref 4–21)
BUN: 35 mg/dL — AB (ref 7–25)
CHLORIDE: 92 mmol/L — AB (ref 98–110)
CO2: 23 mmol/L (ref 20–31)
Calcium: 9.4 mg/dL (ref 8.6–10.4)
Creat: 1.87 mg/dL — ABNORMAL HIGH (ref 0.60–0.88)
Creatinine: 1.5 mg/dL — AB (ref 0.5–1.1)
Glucose, Bld: 102 mg/dL — ABNORMAL HIGH (ref 65–99)
Glucose: 102 mg/dL
POTASSIUM: 5.2 mmol/L (ref 3.5–5.3)
Potassium: 5.4 mmol/L — AB (ref 3.4–5.3)
Sodium: 130 mmol/L — ABNORMAL LOW (ref 135–146)
Sodium: 131 mmol/L — AB (ref 137–147)

## 2014-11-19 NOTE — Progress Notes (Signed)
Patient Care Team: Marin Olp, MD as PCP - General (Family Medicine) Neldon Mc, MD (General Surgery) Charlotte Crumb, MD as Consulting Physician (Orthopedic Surgery)   HPI  Sandra [name] is a 81 y.o. female seen in followup for congestive heart failure in the setting of hypertrophic cardiomyopathy with depressed left ventricular function. She is status post CRT-D implantation.  She is on Coumadin. Because of recurrences of atrial fibrillation she is also a put on dofetilide    She is troubled with recurrent episodes of atrial fibrillation which have been quite symptomatic. She has been managed with this on dofetilide.  \ She was recently hospitalized after right heart cath showed cardiogenic shock barely compensated. She was diuresed and treated with inotropes and we tried to restore sinus rhythm with use of dofetilide and ranolazine. It was transiently successful. Not withstanding, she was subsequently discharged feeling considerably better in the context of her inotropes.  She was discharged November 1 she fell and hit her head November 2 and was seen in the emergency room.  Most recent metabolic profile date BUN Cr  8/16  1.87  11/16  1.67           We have in the past discussed amiodarone versus AV junction ablation. She has been reluctant to consider amiodarone because of the side effects.   Interval echo 10/15 demonstrated RV dysfunction and biatrial enlargement (LAE-2.54) with an LVEF 20-25%     Last echocardiogram 30% 10/13 followed by AV optimization 2/14  Past Medical History  Diagnosis Date  . Atrial fibrillation ([place]) permanent     on Tikosyn and Coumadin  . Hyperlipidemia   . Hypothyroidism   . Bradycardia   . Cardiomyopathy, hypertrophic nonobstructive (HCC)     ejection fraction 25%  . Anal fissure   . Adenomatous colon polyp   . Diverticulosis   . ICD-CRT     generator change 2013  . Systolic heart failure   . History of  colonoscopy   . Migraines   . Hyperlipidemia   . Hypertension   . Chronic renal insufficiency, stage III (moderate)     gd 3-4  . RBBB plus LA hemiblock     Past Surgical History  Procedure Laterality Date  . Cardioversion    . Cardiac defibrillator placement  2007  . Vaginal hysterectomy      uterus alone  . Ovarian cyst removal  1989    benign  . Biv icd genertaor change out N/A 03/23/2011    Procedure: BIV ICD GENERTAOR CHANGE OUT;  Surgeon: Deboraha Sprang, MD;  Location: [facility] CATH LAB;  Service: Cardiovascular;  Laterality: N/A;  . Colonoscopy    . Cardiac catheterization N/A 10/29/2014    Procedure: Right Heart Cath;  Surgeon: Jolaine Artist, MD;  Location: [facility] CV LAB;  Service: Cardiovascular;  Laterality: N/A;  . Cardioversion N/A 11/09/2014    Procedure: CARDIOVERSION;  Surgeon: Jolaine Artist, MD;  Location: [facility] ENDOSCOPY;  Service: Cardiovascular;  Laterality: N/A;    Current Outpatient Prescriptions  Medication Sig Dispense Refill  . albuterol (PROVENTIL HFA;VENTOLIN HFA) 108 (90 BASE) MCG/ACT inhaler Inhale 2 puffs into the lungs every 6 (six) hours as needed for wheezing or shortness of breath. 1 Inhaler 0  . DOBUTamine (DOBUTREX) 4-5 MG/ML-% infusion Inject 330 mcg/min into the vein continuous. 250 mL 6  . dofetilide (TIKOSYN) 250 MCG capsule Take 1 capsule (250 mcg total) by mouth every 12 (twelve) hours. [place]  capsule 6  . ezetimibe (ZETIA) 10 MG tablet Take 10 mg by mouth every Monday, Wednesday, and Friday.     . fluticasone (FLONASE) 50 MCG/ACT nasal spray Place 2 sprays into both nostrils daily. 16 g 6  . ondansetron (ZOFRAN) 4 MG tablet Take 1 tablet (4 mg total) by mouth 2 (two) times daily as needed for nausea or vomiting. 60 tablet 6  . potassium chloride SA (K-DUR,KLOR-CON) 20 MEQ tablet Take 2 tablets (40 mEq total) by mouth daily. 60 tablet 6  . ranolazine (RANEXA) 500 MG 12 hr tablet Take 1 tablet (500 mg total) by mouth 2 (two) times daily. 60  tablet 6  . SYNTHROID 100 MCG tablet TAKE ONE TABLET BY MOUTH ONCE DAILY BEFORE  BREAKFAST (Patient taking differently: TAKE ONE TABLET BY MOUTH ONCE DAILY AT BEDTIME) 90 tablet 3  . torsemide (DEMADEX) 20 MG tablet Take 40 mg daily. Take an extra 20 mg of torsemide in afternoon as needed. 90 tablet 6  . warfarin (COUMADIN) 5 MG tablet Take 5 mg Mon Wed and Fri. Take 2.5 mg on all other days. 90 tablet 1   No current facility-administered medications for this visit.    Allergies  Allergen Reactions  . Levofloxacin Other (See Comments)    Due to cardiac  AF  . Procaine Hcl Other (See Comments)    REACTION: smothers  . Spironolactone Diarrhea    Abdominal pain  . Amiodarone Nausea And Vomiting  . Amoxicillin Other (See Comments)    Rash   . Codeine Other (See Comments)    REACTION: nausea  . Niacin Other (See Comments)    REACTION: rash  . Ramipril Other (See Comments)    unknown  . Statins Other (See Comments)    REACTION: muscle aches    Review of Systems negative except from HPI and PMH  Physical Exam BP 108/66 mmHg  Pulse 70  Ht 5\' 3"  (1.6 m)  Wt 141 lb 3.2 oz (64.048 kg)  BMI 25.02 kg/m2 Well developed and well nourished in no acute distress HENT normal E scleral and icterus clear Neck Supple Clear to ausculation Device pocket well healed; without hematoma or erythema.  There is no tethering Irregularly irregular rate and rhythm, no murmurs gallops or rub Soft with active bowel sounds No clubbing cyanosis  trace  Edema Alert and oriented, grossly normal motor and sensory function Skin Warm and Dry  ECG demonstrates v pacing with  Interval 24/17/51 Axis leftward Assessment and  Plan  Atrial flutter fib permanent  CHF chronic systolic  CRT-D  RBBB-LAD  Dobutamine   We will proceed with AV junction ablation.  Currently CRT pacing only 69% of the time. We'll continue her on dobutamine.  Have reviewed risks and benefits  We continue to stress that  her cardiomyopathy is severe   Hypertrophic cardiomyopathy  Left ventricular dysfunction  Implantable defibrillator-CRT-[employer]  The patient's device was interrogated.  The information was reviewed. The AV delay was shortened on a paced AV 250--200  Atrial fibrillation -on dofetilide  Congestive heart failure-chronic-systolic    S

## 2014-11-19 NOTE — Patient Instructions (Signed)
Medication Instructions: 1) Stop Tikosyn 2) Decrease potassium to 20 meq one tablet at bedtime  Labwork: - Your physician recommends that you have lab work today: BMP/ CBC/ INR  Procedures/Testing: - Your physician has recommended that you have an ablation. Catheter ablation is a medical procedure used to treat some cardiac arrhythmias (irregular heartbeats). During catheter ablation, a long, thin, flexible tube is put into a blood vessel in your groin (upper thigh), or neck. This tube is called an ablation catheter. It is then guided to your heart through the blood vessel. Radio frequency waves destroy small areas of heart tissue where abnormal heartbeats may cause an arrhythmia to start. Please see the instruction sheet given to you today.  Follow-Up: - Your physician recommends that you schedule a follow-up appointment in: 5 weeks with Dr.Klein.  Any Additional Special Instructions Will Be Listed Below (If Applicable). - none

## 2014-11-20 ENCOUNTER — Telehealth (HOSPITAL_COMMUNITY): Payer: Self-pay

## 2014-11-20 ENCOUNTER — Telehealth: Payer: Self-pay

## 2014-11-20 ENCOUNTER — Telehealth: Payer: Self-pay | Admitting: *Deleted

## 2014-11-20 ENCOUNTER — Ambulatory Visit (INDEPENDENT_AMBULATORY_CARE_PROVIDER_SITE_OTHER): Payer: Medicare Other | Admitting: General Practice

## 2014-11-20 DIAGNOSIS — Z5181 Encounter for therapeutic drug level monitoring: Secondary | ICD-10-CM

## 2014-11-20 LAB — PROTIME-INR
INR: 4.16 — ABNORMAL HIGH (ref <1.50)
Prothrombin Time: 40.9 seconds — ABNORMAL HIGH (ref 11.6–15.2)

## 2014-11-20 NOTE — Telephone Encounter (Signed)
Returned call to [facility] from [facility] and given the order from Dr. Haroldine Laws to discontinue Potasium   Repeated order correctly

## 2014-11-20 NOTE — Telephone Encounter (Signed)
Ms Ouida Sills and Verdis Frederickson from [facility] called and reported patients routine K+ level came back at 5.4 with CL. 87  On KCL 20 meq at HS  Also on Dobutamine drip   Routed to Dr. Haroldine Laws

## 2014-11-20 NOTE — Progress Notes (Signed)
I have reviewed and agree with the plan. 

## 2014-11-20 NOTE — Telephone Encounter (Signed)
Verdis Frederickson from [facility] called in stating patient's INR was 6.6 on 11/7 prior to cardiology appointment. Cardiology drew their own INR and resulted 4.6 on 11/7. Spoke with Barnett Applebaum at [facility] and she advised patient to hold INR 11/8 and 11/9 and recheck Thursday and call office back with results. Orene Desanctis and she verbalized understanding. Until Thursday, educated [facility] to ensure patient minimized amount of activity in the day to decrease risk of falls.

## 2014-11-20 NOTE — Telephone Encounter (Signed)
error 

## 2014-11-20 NOTE — Telephone Encounter (Signed)
Cindy Dr. Yong Channel wanted me to send this to you.

## 2014-11-20 NOTE — Progress Notes (Signed)
Pre visit review using our clinic review tool, if applicable. No additional management support is needed unless otherwise documented below in the visit note. 

## 2014-11-21 ENCOUNTER — Encounter: Payer: Self-pay | Admitting: Family Medicine

## 2014-11-22 ENCOUNTER — Ambulatory Visit (INDEPENDENT_AMBULATORY_CARE_PROVIDER_SITE_OTHER): Payer: Medicare Other | Admitting: General Practice

## 2014-11-22 DIAGNOSIS — Z5181 Encounter for therapeutic drug level monitoring: Secondary | ICD-10-CM

## 2014-11-22 LAB — PROTIME-INR
INR: 7.1 ratio (ref 0.8–1.0)
PROTHROMBIN TIME: 74.6 s — AB (ref 9.6–13.1)

## 2014-11-22 LAB — POCT INR: INR: 6

## 2014-11-22 NOTE — Progress Notes (Signed)
I agree with this plan.

## 2014-11-22 NOTE — Addendum Note (Signed)
Addended by: Freada Bergeron on: 11/22/2014 02:23 PM   Modules accepted: Orders

## 2014-11-22 NOTE — Progress Notes (Signed)
Pre visit review using our clinic review tool, if applicable. No additional management support is needed unless otherwise documented below in the visit note. 

## 2014-11-22 NOTE — Telephone Encounter (Signed)
Spoke with Jenny Reichmann and she confirmed she has already taken care of patient this morning

## 2014-11-23 ENCOUNTER — Other Ambulatory Visit (HOSPITAL_COMMUNITY): Payer: Self-pay | Admitting: Cardiology

## 2014-11-25 ENCOUNTER — Other Ambulatory Visit (HOSPITAL_COMMUNITY)
Admission: RE | Admit: 2014-11-25 | Discharge: 2014-11-25 | Disposition: A | Discharge status: Home / Self Care | Payer: Medicare Other | Source: Other Acute Inpatient Hospital | Attending: Internal Medicine | Admitting: Internal Medicine

## 2014-11-25 DIAGNOSIS — Z5181 Encounter for therapeutic drug level monitoring: Secondary | ICD-10-CM | POA: Diagnosis present

## 2014-11-25 LAB — CBC WITH DIFFERENTIAL/PLATELET
BASOS ABS: 0 10*3/uL (ref 0.0–0.1)
BASOS PCT: 0 %
Eosinophils Absolute: 1 10*3/uL — ABNORMAL HIGH (ref 0.0–0.7)
Eosinophils Relative: 13 %
HEMATOCRIT: 37.1 % (ref 36.0–46.0)
HEMOGLOBIN: 12.3 g/dL (ref 12.0–15.0)
LYMPHS PCT: 9 %
Lymphs Abs: 0.8 10*3/uL (ref 0.7–4.0)
MCH: 28.3 pg (ref 26.0–34.0)
MCHC: 33.2 g/dL (ref 30.0–36.0)
MCV: 85.3 fL (ref 78.0–100.0)
MONO ABS: 0.5 10*3/uL (ref 0.1–1.0)
MONOS PCT: 6 %
NEUTROS ABS: 6 10*3/uL (ref 1.7–7.7)
NEUTROS PCT: 72 %
Platelets: 301 10*3/uL (ref 150–400)
RBC: 4.35 MIL/uL (ref 3.87–5.11)
RDW: 16 % — ABNORMAL HIGH (ref 11.5–15.5)
WBC: 8.3 10*3/uL (ref 4.0–10.5)

## 2014-11-25 LAB — BASIC METABOLIC PANEL
ANION GAP: 10 (ref 5–15)
BUN: 28 mg/dL — ABNORMAL HIGH (ref 6–20)
CHLORIDE: 93 mmol/L — AB (ref 101–111)
CO2: 27 mmol/L (ref 22–32)
Calcium: 9.1 mg/dL (ref 8.9–10.3)
Creatinine, Ser: 1.71 mg/dL — ABNORMAL HIGH (ref 0.44–1.00)
GFR calc non Af Amer: 27 mL/min — ABNORMAL LOW (ref >60)
GFR, EST AFRICAN AMERICAN: 31 mL/min — AB (ref >60)
GLUCOSE: 77 mg/dL (ref 65–99)
POTASSIUM: 4.8 mmol/L (ref 3.5–5.1)
Sodium: 130 mmol/L — ABNORMAL LOW (ref 135–145)

## 2014-11-25 LAB — MAGNESIUM: Magnesium: 2.2 mg/dL (ref 1.7–2.4)

## 2014-11-26 ENCOUNTER — Ambulatory Visit (HOSPITAL_COMMUNITY): Admission: RE | Admit: 2014-11-26 | Payer: Medicare Other | Source: Ambulatory Visit | Admitting: Internal Medicine

## 2014-11-26 ENCOUNTER — Encounter (HOSPITAL_COMMUNITY): Admission: RE | Payer: Self-pay | Source: Ambulatory Visit

## 2014-11-26 ENCOUNTER — Ambulatory Visit (INDEPENDENT_AMBULATORY_CARE_PROVIDER_SITE_OTHER): Payer: Medicare Other | Admitting: General Practice

## 2014-11-26 DIAGNOSIS — Z5181 Encounter for therapeutic drug level monitoring: Secondary | ICD-10-CM | POA: Diagnosis not present

## 2014-11-26 LAB — POCT INR: INR: 2

## 2014-11-26 SURGERY — AV NODE ABLATION

## 2014-11-26 NOTE — Progress Notes (Signed)
Pre visit review using our clinic review tool, if applicable. No additional management support is needed unless otherwise documented below in the visit note. 

## 2014-11-27 ENCOUNTER — Encounter: Payer: Self-pay | Admitting: Internal Medicine

## 2014-11-28 ENCOUNTER — Ambulatory Visit (INDEPENDENT_AMBULATORY_CARE_PROVIDER_SITE_OTHER): Payer: Medicare Other | Admitting: General Practice

## 2014-11-28 ENCOUNTER — Telehealth (HOSPITAL_COMMUNITY): Payer: Self-pay

## 2014-11-28 DIAGNOSIS — Z5181 Encounter for therapeutic drug level monitoring: Secondary | ICD-10-CM

## 2014-11-28 LAB — POCT INR: INR: 2.4

## 2014-11-28 NOTE — Progress Notes (Signed)
Pre visit review using our clinic review tool, if applicable. No additional management support is needed unless otherwise documented below in the visit note. 

## 2014-11-28 NOTE — Progress Notes (Signed)
I have reviewed and agree with the plan. 

## 2014-11-28 NOTE — Telephone Encounter (Signed)
HHN reports patient has rash on back and chest, red dotted.  History of this type of rash when she is under stress.  Family says she has been under a lot of stress yesterday  Routed to [facility] and Dr. Haroldine Laws

## 2014-11-29 ENCOUNTER — Encounter (HOSPITAL_COMMUNITY): Payer: Self-pay | Admitting: Internal Medicine

## 2014-11-29 ENCOUNTER — Telehealth (HOSPITAL_COMMUNITY): Payer: Self-pay

## 2014-11-29 ENCOUNTER — Ambulatory Visit (HOSPITAL_COMMUNITY)
Admit: 2014-11-29 | Discharge: 2014-11-29 | Disposition: A | Discharge status: Home / Self Care | Payer: Medicare Other | Source: Ambulatory Visit | Attending: Internal Medicine | Admitting: Internal Medicine

## 2014-11-29 VITALS — BP 124/76 | HR 90 | Wt 143.0 lb

## 2014-11-29 DIAGNOSIS — I5022 Chronic systolic (congestive) heart failure: Secondary | ICD-10-CM | POA: Diagnosis not present

## 2014-11-29 DIAGNOSIS — N184 Chronic kidney disease, stage 4 (severe): Secondary | ICD-10-CM | POA: Diagnosis not present

## 2014-11-29 DIAGNOSIS — L270 Generalized skin eruption due to drugs and medicaments taken internally: Secondary | ICD-10-CM

## 2014-11-29 DIAGNOSIS — E039 Hypothyroidism, unspecified: Secondary | ICD-10-CM | POA: Insufficient documentation

## 2014-11-29 DIAGNOSIS — I481 Persistent atrial fibrillation: Secondary | ICD-10-CM | POA: Diagnosis not present

## 2014-11-29 DIAGNOSIS — I480 Paroxysmal atrial fibrillation: Secondary | ICD-10-CM | POA: Insufficient documentation

## 2014-11-29 DIAGNOSIS — I4819 Other persistent atrial fibrillation: Secondary | ICD-10-CM

## 2014-11-29 NOTE — Progress Notes (Signed)
Patient ID: Sandra [name], female   DOB: October 06, 1932, 81 y.o.   MRN: [id number]  Primary Care: Primary EP: Dr. Caryl Comes Primary HF: Dr. Haroldine Laws   HPI:  Sandra [name] is a 81 y.o. female with h/o PAF, hypothyroidism, St Jude CRT-D CKD stage 4 and chronic systolic HF due to NICM with EF 25%. Tikosyn previously held with worsening renal function.   Admitted 10/29/14 after [place] with cardiogenic shock. Functional decline correlated with recurrence of A fib.  Placed on milrinone and diuresed with IV lasix. Due to persistent low output she was switched from milrinone to dobutamine with improved hemodynamics. Dobutamine was unable to be weaned.  Continued on dobutamine 5 mcg via [facility].  No ace or bb with low output/CKD.  EP consulted for AVN ablation; Deferred in favor of Tikosyn and repeat DC-CV. Tikosyn was reloaded and cardioversion initially successful, but reverted to A fib with controlled rate prior to d/c. [facility]  [facility]. Overall she diuresed 11 pounds with IV lasix and was transitioned to 40 mg daily torsemide. Discharge weight 140  She presents today for post hospital follow up. Fell on 11/14/14 and hit head. CT scan negative. Otherwise stable. She was seen by Dr Caryl Comes 11/19/14 who decided to proceed with AVN ablation, but her pacemaker has now been recalled, so will have to postpone. By their check, only CRT pacing 69% of the time.  Is feeling good overall.  No SOB walking on flat ground.  Avoids stairs.  Sleeps in recliner for comfort, feels insecure about lying flat, but doesn't have orthopnea with 1 pillow. Has occasional tachy-palpitations when at rest, but can't feel when she's active.   [place]: Thoracic Impedence up. Fluid volume dry.   Makena 10/29/2014  RA = 19 RV = 70/9/19 PA = 69/31 (45) PCW = 27 Fick cardiac output/index = 2.3/1.4 Thermo cardiac output/index = 2.3/1.4 PVR = 7.8 WU Ao sat = 94% PA sat = 35%, 38%  Labs 11/25/14 K 4.8, Cr 1.7  Past Medical History   Diagnosis Date  . Atrial fibrillation ([place]) permanent     on Tikosyn and Coumadin  . Hyperlipidemia   . Hypothyroidism   . Bradycardia   . Cardiomyopathy, hypertrophic nonobstructive (HCC)     ejection fraction 25%  . Anal fissure   . Adenomatous colon polyp   . Diverticulosis   . ICD-CRT     generator change 2013  . Systolic heart failure   . History of colonoscopy   . Migraines   . Hyperlipidemia   . Hypertension   . Chronic renal insufficiency, stage III (moderate)     gd 3-4  . RBBB plus LA hemiblock     Current Outpatient Prescriptions  Medication Sig Dispense Refill  . albuterol (PROVENTIL HFA;VENTOLIN HFA) 108 (90 BASE) MCG/ACT inhaler Inhale 2 puffs into the lungs every 6 (six) hours as needed for wheezing or shortness of breath. 1 Inhaler 0  . DOBUTamine (DOBUTREX) 4-5 MG/ML-% infusion Inject 330 mcg/min into the vein continuous. 250 mL 6  . ezetimibe (ZETIA) 10 MG tablet Take 10 mg by mouth every Monday, Wednesday, and Friday.     . fluticasone (FLONASE) 50 MCG/ACT nasal spray Place 2 sprays into both nostrils daily. (Patient taking differently: Place 2 sprays into both nostrils daily as needed for allergies. ) 16 g 6  . ranolazine (RANEXA) 500 MG 12 hr tablet Take 1 tablet (500 mg total) by mouth 2 (two) times daily. 60 tablet 6  .  SYNTHROID 100 MCG tablet TAKE ONE TABLET BY MOUTH ONCE DAILY BEFORE  BREAKFAST (Patient taking differently: TAKE ONE TABLET BY MOUTH ONCE DAILY AT BEDTIME) 90 tablet 3  . torsemide (DEMADEX) 20 MG tablet Take 40 mg daily. Take an extra 20 mg of torsemide in afternoon as needed. 90 tablet 6  . warfarin (COUMADIN) 5 MG tablet Take 5 mg Mon Wed and Fri. Take 2.5 mg on all other days. 90 tablet 1  . ondansetron (ZOFRAN) 4 MG tablet Take 1 tablet (4 mg total) by mouth 2 (two) times daily as needed for nausea or vomiting. (Patient not taking: Reported on 11/29/2014) 60 tablet 6   No current facility-administered medications for this encounter.     Allergies  Allergen Reactions  . Levofloxacin Other (See Comments)    Due to cardiac  AF  . Procaine Hcl Other (See Comments)    REACTION: smothers  . Spironolactone Diarrhea    Abdominal pain  . Amiodarone Nausea And Vomiting  . Amoxicillin Other (See Comments)    Rash   . Codeine Other (See Comments)    REACTION: nausea  . Niacin Other (See Comments)    REACTION: rash  . Ramipril Other (See Comments)    unknown  . Statins Other (See Comments)    REACTION: muscle aches      Social History   Social History  . Marital Status: Married    Spouse Name: N/A  . Number of Children: 3  . Years of Education: N/A   Occupational History  . Retired     Probation officer   Social History Main Topics  . Smoking status: Former Smoker -- 0.20 packs/day for 19 years    Types: Cigarettes    Quit date: 01/13/1987  . Smokeless tobacco: Never Used  . Alcohol Use: No  . Drug Use: No  . Sexual Activity: Not Currently   Other Topics Concern  . Not on file   Social History Narrative   Married 1953 (husband Clare Gandy in our [employer]). 3 girls (1 is a patient here-[name]). 2 grandkids.       Retired in [place]: very active, going to church and church friends, movies      Family History  Problem Relation Age of Onset  . Heart disease Mother   . Colon cancer Father   . COPD Father   . Heart disease Father   . Cardiomyopathy Daughter   . Breast cancer Paternal Aunt   . Colon polyps Father   . Diabetes Neg Hx   . Kidney disease Neg Hx     Filed Vitals:   11/29/14 0914  BP: 124/76  Pulse: 90  Weight: 143 lb (64.864 kg)  SpO2: 97%    PHYSICAL EXAM: General:  Well appearing. No respiratory difficulty HEENT: normal Neck: supple. no JVD. Carotids 2+ bilat; no bruits. No lymphadenopathy or thyromegaly appreciated. Cor: PMI laterally displaced. Irregularly irregular.  Lungs: CTA, normal effort. Abdomen: soft, NT, ND, no HSM. No bruits or masses.  +BS Extremities: no cyanosis, clubbing, rash, edema Neuro: alert & oriented x 3, cranial nerves grossly intact. moves all 4 extremities w/o difficulty. Affect pleasant. Skin: Morbiliform drug rash neck, trunk, back. Also thighs and buttocks per pt.   ASSESSMENT & PLAN:  1. Chronic systolic HF due to NICM, Echo 10/2014 EF 20-25% s/p St Jude CRT-D  - Pacemaker recalled 2. PAF -S/P DC-CV 10/28 but later back in A fib  3. CKD,  stage 4 -Labs 11/25/14 K 4.8, Cr 1.7 4. Hypothyroidism  Recent RHC as above.  Dr Caryl Comes had decided to proceed with AVN ablation, but it has recently come to light that her pacemaker has been recalled and she will require replacement.    Recommend [facility] for conditioning.   Off tikosyn, so stop ranexa.  Has drug rash. Take 12.5mg  benadryl prn for itching. Started 11/27/14. Dobutamine bag changed 11/28/14, so unlikely culprit.  Restarted coumadin 11/26/14. No other coumadin.  Needs to see Derm.  May need steroids.  Legrand Como [address]" [place], PA-C 11/29/2014 9:49 AM   Patient seen and examined with Oda Kilts, PA-C. We discussed all aspects of the encounter. I agree with the assessment and plan as stated above.   She is improved on dobutamine despite recurrence of AF. Tikosyn now off so can stop Ranexa as well. AVN ablation on hold for now given recall of PM lead. Will continue current regimen for now. Will refer to HF program at [facility] for deconditioning. Drug rash concerning. I am worried it could be dobutamine though it would be delayed response. Will see if improves with stopping Ranexa. Discussed with Pharmacy. Will follow closely.   Jadarius Commons,MD 10:50 PM

## 2014-11-29 NOTE — Patient Instructions (Addendum)
Stop Renexa   See your dermatologist about rash  Follow up in 3-4 weeks

## 2014-11-29 NOTE — Telephone Encounter (Signed)
Verdis Frederickson from [facility] reported that the Na+ level drawn on lab this past weekend was 130. [phone]).  Lab seen by Dr. Haroldine Laws

## 2014-12-03 ENCOUNTER — Encounter: Payer: Medicare Other | Admitting: Internal Medicine

## 2014-12-03 ENCOUNTER — Ambulatory Visit: Payer: Medicare Other | Admitting: Family Medicine

## 2014-12-04 ENCOUNTER — Ambulatory Visit (INDEPENDENT_AMBULATORY_CARE_PROVIDER_SITE_OTHER): Payer: Medicare Other | Admitting: General Practice

## 2014-12-04 DIAGNOSIS — Z5181 Encounter for therapeutic drug level monitoring: Secondary | ICD-10-CM

## 2014-12-04 LAB — POCT INR: INR: 3.2

## 2014-12-04 NOTE — Progress Notes (Signed)
I have reviewed and agree with the plan. 

## 2014-12-04 NOTE — Progress Notes (Signed)
Pre visit review using our clinic review tool, if applicable. No additional management support is needed unless otherwise documented below in the visit note. 

## 2014-12-10 ENCOUNTER — Telehealth (HOSPITAL_COMMUNITY): Payer: Self-pay

## 2014-12-10 ENCOUNTER — Ambulatory Visit (INDEPENDENT_AMBULATORY_CARE_PROVIDER_SITE_OTHER): Payer: Medicare Other | Admitting: General Practice

## 2014-12-10 DIAGNOSIS — Z5181 Encounter for therapeutic drug level monitoring: Secondary | ICD-10-CM

## 2014-12-10 LAB — POCT INR: INR: 2.7

## 2014-12-10 NOTE — Telephone Encounter (Signed)
Maria from [facility] called and reported patient had HR of 96 today and irregular No other c/o or symtoms

## 2014-12-10 NOTE — Progress Notes (Signed)
Pre visit review using our clinic review tool, if applicable. No additional management support is needed unless otherwise documented below in the visit note. 

## 2014-12-10 NOTE — Telephone Encounter (Signed)
That's stable. She is in AF chronically.

## 2014-12-10 NOTE — Progress Notes (Signed)
Agree with this plan.

## 2014-12-14 ENCOUNTER — Telehealth: Payer: Self-pay | Admitting: Cardiology

## 2014-12-14 NOTE — Telephone Encounter (Signed)
LMOVM requesting that pt send a remote transmission from home monitor b/c we have not received one in at least 8 days.  

## 2014-12-16 ENCOUNTER — Other Ambulatory Visit (HOSPITAL_COMMUNITY)
Admission: RE | Admit: 2014-12-16 | Discharge: 2014-12-16 | Disposition: A | Discharge status: Home / Self Care | Payer: Medicare Other | Source: Other Acute Inpatient Hospital | Attending: Internal Medicine | Admitting: Internal Medicine

## 2014-12-16 DIAGNOSIS — Z5181 Encounter for therapeutic drug level monitoring: Secondary | ICD-10-CM | POA: Diagnosis present

## 2014-12-16 LAB — CBC WITH DIFFERENTIAL/PLATELET
BASOS ABS: 0 10*3/uL (ref 0.0–0.1)
BASOS PCT: 0 %
EOS PCT: 3 %
Eosinophils Absolute: 0.2 10*3/uL (ref 0.0–0.7)
HCT: 34.7 % — ABNORMAL LOW (ref 36.0–46.0)
Hemoglobin: 11 g/dL — ABNORMAL LOW (ref 12.0–15.0)
Lymphocytes Relative: 13 %
Lymphs Abs: 0.7 10*3/uL (ref 0.7–4.0)
MCH: 28.1 pg (ref 26.0–34.0)
MCHC: 31.7 g/dL (ref 30.0–36.0)
MCV: 88.5 fL (ref 78.0–100.0)
MONO ABS: 0.6 10*3/uL (ref 0.1–1.0)
Monocytes Relative: 11 %
NEUTROS ABS: 4.1 10*3/uL (ref 1.7–7.7)
Neutrophils Relative %: 73 %
PLATELETS: 292 10*3/uL (ref 150–400)
RBC: 3.92 MIL/uL (ref 3.87–5.11)
RDW: 18.1 % — AB (ref 11.5–15.5)
WBC: 5.7 10*3/uL (ref 4.0–10.5)

## 2014-12-16 LAB — BASIC METABOLIC PANEL
ANION GAP: 13 (ref 5–15)
BUN: 31 mg/dL — ABNORMAL HIGH (ref 6–20)
CALCIUM: 9.6 mg/dL (ref 8.9–10.3)
CO2: 28 mmol/L (ref 22–32)
Chloride: 96 mmol/L — ABNORMAL LOW (ref 101–111)
Creatinine, Ser: 1.64 mg/dL — ABNORMAL HIGH (ref 0.44–1.00)
GFR calc Af Amer: 33 mL/min — ABNORMAL LOW (ref >60)
GFR, EST NON AFRICAN AMERICAN: 28 mL/min — AB (ref >60)
GLUCOSE: 86 mg/dL (ref 65–99)
POTASSIUM: 3.8 mmol/L (ref 3.5–5.1)
SODIUM: 137 mmol/L (ref 135–145)

## 2014-12-16 LAB — MAGNESIUM: MAGNESIUM: 2.4 mg/dL (ref 1.7–2.4)

## 2014-12-17 ENCOUNTER — Ambulatory Visit (INDEPENDENT_AMBULATORY_CARE_PROVIDER_SITE_OTHER): Payer: Medicare Other | Admitting: General Practice

## 2014-12-17 ENCOUNTER — Telehealth: Payer: Self-pay | Admitting: Internal Medicine

## 2014-12-17 ENCOUNTER — Encounter: Payer: Self-pay | Admitting: Internal Medicine

## 2014-12-17 DIAGNOSIS — Z5181 Encounter for therapeutic drug level monitoring: Secondary | ICD-10-CM

## 2014-12-17 LAB — POCT INR: INR: 2.6

## 2014-12-17 NOTE — Telephone Encounter (Signed)
Spoke w/ pt and informed her that we have not received a transmission since November 29, 2014. Attempted to help pt trouble shoot home monitor. Instructed pt to call tech services on Thursday / Friday around 8 AM to receive help trouble shooting the monitor further. Pt verbalized understanding.

## 2014-12-17 NOTE — Progress Notes (Signed)
Pre visit review using our clinic review tool, if applicable. No additional management support is needed unless otherwise documented below in the visit note. 

## 2014-12-17 NOTE — Telephone Encounter (Signed)
°  1. Has your device fired? No   2. Is you device beeping? no  3. Are you experiencing draining or swelling at device site? no  4. Are you calling to see if we received your device transmission? yes  5. Have you passed out? No

## 2014-12-19 ENCOUNTER — Telehealth (HOSPITAL_COMMUNITY): Payer: Self-pay | Admitting: *Deleted

## 2014-12-19 NOTE — Telephone Encounter (Signed)
[place] RN called to report that she can not get pt's cap off her unused line, she states it is flushing fine and she does not want to use force but wanted [place] to be aware

## 2014-12-21 ENCOUNTER — Telehealth: Payer: Self-pay | Admitting: Cardiology

## 2014-12-21 NOTE — Telephone Encounter (Signed)
Pt called and informed me that she had been on the phone for 1 year w/ [facility] tech services and they are going to send her a new home monitor. Informed her that she will probably have device interrogated on 12-25-14 when see's MD. She verbalized understanding and is aware to hook home monitor up once she gets new monitor.

## 2014-12-24 NOTE — Progress Notes (Signed)
Patient Care Team: Marin Olp, MD as PCP - General (Family Medicine) Neldon Mc, MD (General Surgery) Charlotte Crumb, MD as Consulting Physician (Orthopedic Surgery)   HPI  Sandra [name] is a 81 y.o. female seen in followup for congestive heart failure in the setting of hypertrophic cardiomyopathy with depressed left ventricular function. She is status post CRT-D implantation.  She is on Coumadin. Because of recurrences of atrial fibrillation she is also a put on dofetilide    She is troubled with recurrent episodes of atrial fibrillation which have been quite symptomatic. She has been managed with this on dofetilide.  \ She was recently hospitalized after right heart cath showed cardiogenic shock barely compensated. She was diuresed and treated with inotropes and we tried to restore sinus rhythm with use of dofetilide and ranolazine. It was transiently successful. Not withstanding, she was subsequently discharged feeling considerably better in the context of her inotropes.  Most recent metabolic profile date BUN Cr  8/16  1.87  11/16  1.67       We have in the past discussed amiodarone versus AV junction ablation. She has been reluctant to consider amiodarone because of the side effects.  We ultimately decided NOT to proceed with AV Ablation as the pts device is on RECALL for premature battery depletion and did not want  To render her device dependent   She has been doing pretty well on her dobutamine. Exercise tolerance is better energy level is better fatigue is less. She does note that with ambulation, there is significant tachy palpitations  Interval echo 10/15 demonstrated RV dysfunction and biatrial enlargement (LAE-2.54) with an LVEF 20-25%     Last echocardiogram 30% 10/13 followed by AV optimization 2/14  Past Medical History  Diagnosis Date  . Atrial fibrillation ([place]) permanent     on Tikosyn and Coumadin  . Hyperlipidemia   . Hypothyroidism    . Bradycardia   . Cardiomyopathy, hypertrophic nonobstructive (HCC)     ejection fraction 25%  . Anal fissure   . Adenomatous colon polyp   . Diverticulosis   . ICD-CRT     generator change 2013  . Systolic heart failure   . History of colonoscopy   . Migraines   . Hyperlipidemia   . Hypertension   . Chronic renal insufficiency, stage III (moderate)     gd 3-4  . RBBB plus LA hemiblock     Past Surgical History  Procedure Laterality Date  . Cardioversion    . Cardiac defibrillator placement  2007  . Vaginal hysterectomy      uterus alone  . Ovarian cyst removal  1989    benign  . Biv icd genertaor change out N/A 03/23/2011    Procedure: BIV ICD GENERTAOR CHANGE OUT;  Surgeon: Deboraha Sprang, MD;  Location: [facility] CATH LAB;  Service: Cardiovascular;  Laterality: N/A;  . Colonoscopy    . Cardiac catheterization N/A 10/29/2014    Procedure: Right Heart Cath;  Surgeon: Jolaine Artist, MD;  Location: [facility] CV LAB;  Service: Cardiovascular;  Laterality: N/A;  . Cardioversion N/A 11/09/2014    Procedure: CARDIOVERSION;  Surgeon: Jolaine Artist, MD;  Location: [facility] ENDOSCOPY;  Service: Cardiovascular;  Laterality: N/A;    Current Outpatient Prescriptions  Medication Sig Dispense Refill  . DOBUTamine (DOBUTREX) 4-5 MG/ML-% infusion Inject 330 mcg/min into the vein continuous. 250 mL 6  . ezetimibe (ZETIA) 10 MG tablet Take 10 mg by mouth every Monday,  Wednesday, and Friday.     . fluticasone (FLONASE) 50 MCG/ACT nasal spray Place 2 sprays into both nostrils daily. 16 g 6  . SYNTHROID 100 MCG tablet TAKE ONE TABLET BY MOUTH ONCE DAILY BEFORE  BREAKFAST (Patient taking differently: TAKE ONE TABLET BY MOUTH ONCE DAILY AT BEDTIME) 90 tablet 3  . torsemide (DEMADEX) 20 MG tablet Take 40 mg daily. Take an extra 20 mg of torsemide in afternoon as needed. 90 tablet 6  . warfarin (COUMADIN) 5 MG tablet Take 5 mg Mon Wed and Fri. Take 2.5 mg on all other days. 90 tablet 1   No  current facility-administered medications for this visit.    Allergies  Allergen Reactions  . Levofloxacin Other (See Comments)    Due to cardiac  AF  . Procaine Hcl Other (See Comments)    REACTION: smothers  . Spironolactone Diarrhea    Abdominal pain  . Amiodarone Nausea And Vomiting  . Amoxicillin Other (See Comments)    Rash   . Codeine Other (See Comments)    REACTION: nausea  . Niacin Other (See Comments)    REACTION: rash  . Ramipril Other (See Comments)    unknown  . Statins Other (See Comments)    REACTION: muscle aches    Review of Systems negative except from HPI and PMH  Physical Exam BP 120/82 mmHg  Pulse 109  Ht 5\' 3"  (1.6 m)  Wt 142 lb (64.411 kg)  BMI 25.16 kg/m2 Well developed and well nourished in no acute distress HENT normal E scleral and icterus clear Neck Supple Clear to ausculation Device pocket well healed; without hematoma or erythema.  There is no tethering Irregularly irregular rate and rhythm, no murmurs gallops or rub Soft with active bowel sounds No clubbing cyanosis  trace  Edema Alert and oriented, grossly normal motor and sensory function Skin Warm and Dry   ECG  Afib 108 RBBB LAD    Assessment and  Plan  Atrial flutter fib permanent  CHF chronic systolic    CRT-D  RBBB-LAD  Dobutamine    Hypertrophic cardiomyopathy  NICM   Implantable defibrillator-CRT-St. Jude  The patient's device was interrogated.  The information was reviewed. The AV delay was shortened on a paced AV 250--200    Euvolemic continue current meds  Her atrial fibrillation rate is fast. I've discussed this with Dr. Reine Just. He would like not to adjust her dobutamine; we discussed AV ablation and digoxin. I would prefer the latter as the former, in the context of her recall device, would necessitate device generator replacement with its attendant risks.  Furthermore, based on RACE 2, I would not be too aggressive pushing her heart rate very far down as  her mean already is below 100

## 2014-12-25 ENCOUNTER — Other Ambulatory Visit (HOSPITAL_COMMUNITY): Payer: Self-pay | Admitting: Internal Medicine

## 2014-12-25 ENCOUNTER — Ambulatory Visit (INDEPENDENT_AMBULATORY_CARE_PROVIDER_SITE_OTHER): Payer: Medicare Other | Admitting: Internal Medicine

## 2014-12-25 ENCOUNTER — Encounter: Payer: Self-pay | Admitting: Internal Medicine

## 2014-12-25 ENCOUNTER — Encounter (HOSPITAL_COMMUNITY): Payer: Self-pay | Admitting: Internal Medicine

## 2014-12-25 ENCOUNTER — Ambulatory Visit (HOSPITAL_COMMUNITY)
Admission: RE | Admit: 2014-12-25 | Discharge: 2014-12-25 | Disposition: A | Discharge status: Home / Self Care | Payer: Medicare Other | Source: Ambulatory Visit | Attending: Internal Medicine | Admitting: Internal Medicine

## 2014-12-25 VITALS — BP 120/82 | HR 109 | Ht 63.0 in | Wt 142.0 lb

## 2014-12-25 VITALS — BP 110/68 | HR 92 | Wt 144.5 lb

## 2014-12-25 DIAGNOSIS — I5022 Chronic systolic (congestive) heart failure: Secondary | ICD-10-CM

## 2014-12-25 DIAGNOSIS — I421 Obstructive hypertrophic cardiomyopathy: Secondary | ICD-10-CM | POA: Diagnosis not present

## 2014-12-25 DIAGNOSIS — I482 Chronic atrial fibrillation, unspecified: Secondary | ICD-10-CM

## 2014-12-25 DIAGNOSIS — Z95810 Presence of automatic (implantable) cardiac defibrillator: Secondary | ICD-10-CM

## 2014-12-25 DIAGNOSIS — I481 Persistent atrial fibrillation: Secondary | ICD-10-CM | POA: Diagnosis not present

## 2014-12-25 DIAGNOSIS — I4819 Other persistent atrial fibrillation: Secondary | ICD-10-CM

## 2014-12-25 LAB — CUP PACEART INCLINIC DEVICE CHECK
Brady Statistic RA Percent Paced: 0.02 %
Brady Statistic RV Percent Paced: 11 %
HIGH POWER IMPEDANCE MEASURED VALUE: 39 Ohm
Implantable Lead Implant Date: 20071101
Implantable Lead Implant Date: 20071101
Implantable Lead Location: 753858
Lead Channel Impedance Value: 387.5 Ohm
Lead Channel Setting Pacing Amplitude: 2 V
Lead Channel Setting Pacing Amplitude: 2.125
Lead Channel Setting Pacing Pulse Width: 0.5 ms
Lead Channel Setting Pacing Pulse Width: 0.5 ms
MDC IDC LEAD IMPLANT DT: 20071101
MDC IDC LEAD LOCATION: 753859
MDC IDC LEAD LOCATION: 753860
MDC IDC LEAD SERIAL: 166326
MDC IDC MSMT BATTERY REMAINING LONGEVITY: 49.2
MDC IDC MSMT LEADCHNL LV IMPEDANCE VALUE: 487.5 Ohm
MDC IDC MSMT LEADCHNL RV IMPEDANCE VALUE: 362.5 Ohm
MDC IDC SESS DTM: 20161213191527
MDC IDC SET LEADCHNL RV PACING AMPLITUDE: 2 V
MDC IDC SET LEADCHNL RV SENSING SENSITIVITY: 0.5 mV
Pulse Gen Serial Number: 7032457

## 2014-12-25 LAB — CARBOXYHEMOGLOBIN
Carboxyhemoglobin: 1.1 % (ref 0.5–1.5)
METHEMOGLOBIN: 0.6 % (ref 0.0–1.5)
O2 SAT: 35.3 %
TOTAL HEMOGLOBIN: 10.8 g/dL — AB (ref 12.0–16.0)

## 2014-12-25 LAB — BASIC METABOLIC PANEL
Anion gap: 8 (ref 5–15)
BUN: 31 mg/dL — AB (ref 6–20)
CHLORIDE: 101 mmol/L (ref 101–111)
CO2: 27 mmol/L (ref 22–32)
CREATININE: 1.71 mg/dL — AB (ref 0.44–1.00)
Calcium: 9.4 mg/dL (ref 8.9–10.3)
GFR calc Af Amer: 31 mL/min — ABNORMAL LOW (ref >60)
GFR calc non Af Amer: 27 mL/min — ABNORMAL LOW (ref >60)
GLUCOSE: 111 mg/dL — AB (ref 65–99)
POTASSIUM: 4.2 mmol/L (ref 3.5–5.1)
Sodium: 136 mmol/L (ref 135–145)

## 2014-12-25 NOTE — Patient Instructions (Signed)
Take an extra 20 mg of Torsemide tonight  Your physician recommends that you schedule a follow-up appointment in: 6 weeks

## 2014-12-25 NOTE — Patient Instructions (Signed)
Medication Instructions: - no changes  Labwork: - none  Procedures/Testing: - none  Follow-Up: - Remote monitoring is used to monitor your Pacemaker of ICD from home. This monitoring reduces the number of office visits required to check your device to one time per year. It allows [place] to keep an eye on the functioning of your device to ensure it is working properly. You are scheduled for a device check from home on 03/26/15. You may send your transmission at any time that day. If you have a wireless device, the transmission will be sent automatically. After your physician reviews your transmission, you will receive a postcard with your next transmission date.  - Your physician wants you to follow-up in: 6 months with Dr. Caryl Comes. You will receive a reminder letter in the mail two months in advance. If you don't receive a letter, please call our office to schedule the follow-up appointment.  Any Additional Special Instructions Will Be Listed Below (If Applicable).

## 2014-12-25 NOTE — Progress Notes (Signed)
Patient ID: Sandra [name], female   DOB: 05-Aug-1932, 81 y.o.   MRN: [id number]    Advanced Heart Failure Clinic Note   Primary Care: Primary EP: Dr. Caryl Comes Primary HF: Dr. Haroldine Laws   HPI:  Sandra [name] is a 81 y.o. female with h/o PAF, hypothyroidism, St Jude CRT-D CKD stage 4 and chronic systolic HF due to NICM with EF 25%. Tikosyn previously held with worsening renal function.   Admitted 10/29/14 after [place] with cardiogenic shock. Functional decline correlated with recurrence of A fib.  Placed on milrinone and diuresed with IV lasix. Due to persistent low output she was switched from milrinone to dobutamine with improved hemodynamics. Dobutamine was unable to be weaned.  Continued on dobutamine 5 mcg via AHC.  No ace or bb with low output/CKD.  EP consulted for AVN ablation; Deferred in favor of Tikosyn and repeat DC-CV. Tikosyn was reloaded and cardioversion initially successful, but reverted to A fib with controlled rate prior to d/c. Tikosyn stopped. [facility]. Overall she diuresed 11 pounds with IV lasix and was transitioned to 40 mg daily torsemide. Discharge weight 140  She presents today for regular follow up. Up 1 lb since last visit. Skin rash went away 1 week after last visit. Saw dermatologist who agreed drug rash. Denies SOB, but HR goes up to 120s when walking around house with occasional tachypalpitations. No orthopnea, but still sleeps in recliner. Walking around stores now, as long as she holds a Scientist, research (physical sciences).  Still doesn't have much energy.  Has not done [facility] cardiac rehab. Getting around a lot better per daughter. [facility] nurse told her not to vacuum. Weights at home 142.  Has gained a few pounds at home but has been eating more. Pt also states PICC line caps are "stuck" and red port is not drawing back blood.   Corvue: Interrogator not working. Also sees Dr Caryl Comes today.  [place] 10/29/2014  RA = 19 RV = 70/9/19 PA = 69/31 (45) PCW = 27 Fick cardiac  output/index = 2.3/1.4 Thermo cardiac output/index = 2.3/1.4 PVR = 7.8 WU Ao sat = 94% PA sat = 35%, 38%  Labs 11/25/14 K 4.8, Cr 1.7  Past Medical History  Diagnosis Date  . Atrial fibrillation ([place]) permanent     on Tikosyn and Coumadin  . Hyperlipidemia   . Hypothyroidism   . Bradycardia   . Cardiomyopathy, hypertrophic nonobstructive (HCC)     ejection fraction 25%  . Anal fissure   . Adenomatous colon polyp   . Diverticulosis   . ICD-CRT     generator change 2013  . Systolic heart failure   . History of colonoscopy   . Migraines   . Hyperlipidemia   . Hypertension   . Chronic renal insufficiency, stage III (moderate)     gd 3-4  . RBBB plus LA hemiblock     Current Outpatient Prescriptions  Medication Sig Dispense Refill  . albuterol (PROVENTIL HFA;VENTOLIN HFA) 108 (90 BASE) MCG/ACT inhaler Inhale 2 puffs into the lungs every 6 (six) hours as needed for wheezing or shortness of breath. 1 Inhaler 0  . DOBUTamine (DOBUTREX) 4-5 MG/ML-% infusion Inject 330 mcg/min into the vein continuous. 250 mL 6  . ezetimibe (ZETIA) 10 MG tablet Take 10 mg by mouth every Monday, Wednesday, and Friday.     Marland Kitchen SYNTHROID 100 MCG tablet TAKE ONE TABLET BY MOUTH ONCE DAILY BEFORE  BREAKFAST (Patient taking differently: TAKE ONE TABLET BY MOUTH ONCE DAILY  AT BEDTIME) 90 tablet 3  . torsemide (DEMADEX) 20 MG tablet Take 40 mg daily. Take an extra 20 mg of torsemide in afternoon as needed. 90 tablet 6  . warfarin (COUMADIN) 5 MG tablet Take 5 mg Mon Wed and Fri. Take 2.5 mg on all other days. 90 tablet 1  . fluticasone (FLONASE) 50 MCG/ACT nasal spray Place 2 sprays into both nostrils daily. (Patient not taking: Reported on 12/25/2014) 16 g 6   No current facility-administered medications for this encounter.    Allergies  Allergen Reactions  . Levofloxacin Other (See Comments)    Due to cardiac  AF  . Procaine Hcl Other (See Comments)    REACTION: smothers  . Spironolactone Diarrhea     Abdominal pain  . Amiodarone Nausea And Vomiting  . Amoxicillin Other (See Comments)    Rash   . Codeine Other (See Comments)    REACTION: nausea  . Niacin Other (See Comments)    REACTION: rash  . Ramipril Other (See Comments)    unknown  . Statins Other (See Comments)    REACTION: muscle aches      Social History   Social History  . Marital Status: Married    Spouse Name: N/A  . Number of Children: 3  . Years of Education: N/A   Occupational History  . Retired     Probation officer   Social History Main Topics  . Smoking status: Former Smoker -- 0.20 packs/day for 19 years    Types: Cigarettes    Quit date: 01/13/1987  . Smokeless tobacco: Never Used  . Alcohol Use: No  . Drug Use: No  . Sexual Activity: Not Currently   Other Topics Concern  . Not on file   Social History Narrative   Married 1953 (husband Clare Gandy in our Network engineer). 3 girls (1 is a patient here-[name]). 2 grandkids.       Retired in [place]: very active, going to church and church friends, movies      Family History  Problem Relation Age of Onset  . Heart disease Mother   . Colon cancer Father   . COPD Father   . Heart disease Father   . Cardiomyopathy Daughter   . Breast cancer Paternal Aunt   . Colon polyps Father   . Diabetes Neg Hx   . Kidney disease Neg Hx     Filed Vitals:   12/25/14 1017  BP: 110/68  Pulse: 92  Weight: 144 lb 8 oz (65.545 kg)  SpO2: 98%      PHYSICAL EXAM: General:  Well appearing. NAD HEENT: normal Neck: supple. JVD 8-9. Carotids 2+ bilat; no bruits. No thyromegaly or nodule noted. Cor: PMI laterally displaced. Irregularly irregular.  Lungs: CTA, normal effort. Abdomen: soft, nontender, nondistended, no HSM. No bruits or masses. +BS Extremities: no cyanosis, clubbing, rash, edema Neuro: alert & oriented x 3, cranial nerves grossly intact. moves all 4 extremities w/o difficulty. Affect pleasant. Skin: no rash.  ASSESSMENT &  PLAN:  1. Chronic systolic HF due to NICM, Echo 10/2014 EF 20-25% s/p St Jude CRT-D  - Dobutamine dependent - Pacemaker recalled 2. PAF -S/P DC-CV 10/28 but later back in A fib  3. CKD, stage 4 -Labs 11/25/14 K 4.8, Cr 1.7 4. Hypothyroidism  Sees Dr Caryl Comes this afternoon. Timing of AV nodal ablation pending, as pacemaker is part of recall.   She is slightly volume overloaded today. Should take extra 20  mg torsemide this pm. Encouraged to take an extra torsemide if weight goes above 142 at home.  Will check BMET and Coox today.  Follow up in 6 weeks.  PICC line was not pulling back or flushing well per [facility]. RN, Megan, flushed and drew back blood without difficulty.  Also had extra port connectors that are unnecessary and difficult to remove. Port caps replaced by Dr. Haroldine Laws at bedside without incident.   Has not been driving. Encouraged to not drive if she does not feel safe. She has no specific driving restrictions in place.   Legrand Como [address]" Marshall, PA-C 12/25/2014 10:26 AM   Patient seen and examined with Oda Kilts, PA-C. We discussed all aspects of the encounter. I agree with the assessment and plan as stated above.   Overall improved on dobutamine. Still NYHA III-IIIB. Volume status slightly elevated. Will give extra torsemide. Reinforced need for daily weights and reviewed use of sliding scale diuretics AF rate still fast. Failed cardioversion as well as amio and Tikosyn. Pending possible AV node ablation but her CRT device has been recalled and there is concern about making her pacing dependent in this setting. She follows with Dr. Caryl Comes. I adjusted PICC line today.   Total time spent 35 minutes. Over half that time spent discussing above.   Lameshia Hypolite,MD 10:22 PM

## 2014-12-31 ENCOUNTER — Ambulatory Visit (INDEPENDENT_AMBULATORY_CARE_PROVIDER_SITE_OTHER): Payer: Medicare Other | Admitting: General Practice

## 2014-12-31 ENCOUNTER — Other Ambulatory Visit: Payer: Self-pay

## 2014-12-31 ENCOUNTER — Telehealth: Payer: Self-pay | Admitting: Family Medicine

## 2014-12-31 LAB — POCT INR
INR: 2.3
INR: 3.2

## 2014-12-31 MED ORDER — CLORAZEPATE DIPOTASSIUM 7.5 MG PO TABS: 7.5000 mg | ORAL_TABLET | Freq: Two times a day (BID) | ORAL | Status: DC | PRN

## 2014-12-31 NOTE — Telephone Encounter (Signed)
Dr. Diona Fanti see below.

## 2014-12-31 NOTE — Telephone Encounter (Signed)
Sandra [name] w/[facility] wanted the doctor to know that the patient's INR was 3.2 today.

## 2014-12-31 NOTE — Progress Notes (Signed)
Pre visit review using our clinic review tool, if applicable. No additional management support is needed unless otherwise documented below in the visit note. 

## 2014-12-31 NOTE — Telephone Encounter (Signed)
Thanks for update. Jenny Reichmann thanks for your management.

## 2015-01-04 ENCOUNTER — Other Ambulatory Visit (HOSPITAL_COMMUNITY): Payer: Self-pay | Admitting: Internal Medicine

## 2015-01-04 DIAGNOSIS — I4891 Unspecified atrial fibrillation: Secondary | ICD-10-CM | POA: Insufficient documentation

## 2015-01-06 ENCOUNTER — Other Ambulatory Visit (HOSPITAL_COMMUNITY)
Admission: RE | Admit: 2015-01-06 | Discharge: 2015-01-06 | Disposition: A | Discharge status: Home / Self Care | Payer: Medicare Other | Source: Skilled Nursing Facility | Attending: Internal Medicine | Admitting: Internal Medicine

## 2015-01-06 DIAGNOSIS — Z5181 Encounter for therapeutic drug level monitoring: Secondary | ICD-10-CM | POA: Insufficient documentation

## 2015-01-06 LAB — CBC WITH DIFFERENTIAL/PLATELET
BASOS ABS: 0 10*3/uL (ref 0.0–0.1)
Basophils Relative: 0 %
Eosinophils Absolute: 0.2 10*3/uL (ref 0.0–0.7)
Eosinophils Relative: 4 %
HEMATOCRIT: 33.7 % — AB (ref 36.0–46.0)
HEMOGLOBIN: 10.4 g/dL — AB (ref 12.0–15.0)
LYMPHS PCT: 11 %
Lymphs Abs: 0.6 10*3/uL — ABNORMAL LOW (ref 0.7–4.0)
MCH: 27.1 pg (ref 26.0–34.0)
MCHC: 30.9 g/dL (ref 30.0–36.0)
MCV: 87.8 fL (ref 78.0–100.0)
MONO ABS: 0.6 10*3/uL (ref 0.1–1.0)
MONOS PCT: 11 %
NEUTROS ABS: 4.2 10*3/uL (ref 1.7–7.7)
NEUTROS PCT: 74 %
Platelets: 231 10*3/uL (ref 150–400)
RBC: 3.84 MIL/uL — ABNORMAL LOW (ref 3.87–5.11)
RDW: 17 % — AB (ref 11.5–15.5)
WBC: 5.6 10*3/uL (ref 4.0–10.5)

## 2015-01-06 LAB — BASIC METABOLIC PANEL
ANION GAP: 8 (ref 5–15)
BUN: 42 mg/dL — AB (ref 6–20)
CALCIUM: 8.7 mg/dL — AB (ref 8.9–10.3)
CHLORIDE: 104 mmol/L (ref 101–111)
CO2: 26 mmol/L (ref 22–32)
CREATININE: 1.75 mg/dL — AB (ref 0.44–1.00)
GFR calc non Af Amer: 26 mL/min — ABNORMAL LOW (ref >60)
GFR, EST AFRICAN AMERICAN: 31 mL/min — AB (ref >60)
GLUCOSE: 86 mg/dL (ref 65–99)
Potassium: 3.8 mmol/L (ref 3.5–5.1)
Sodium: 138 mmol/L (ref 135–145)

## 2015-01-06 LAB — MAGNESIUM: Magnesium: 2.2 mg/dL (ref 1.7–2.4)

## 2015-01-09 ENCOUNTER — Ambulatory Visit (INDEPENDENT_AMBULATORY_CARE_PROVIDER_SITE_OTHER): Payer: Medicare Other | Admitting: General Practice

## 2015-01-09 LAB — POCT INR: INR: 2.5

## 2015-01-09 NOTE — Progress Notes (Signed)
I have reviewed and agree with the plan. 

## 2015-01-09 NOTE — Progress Notes (Signed)
Pre visit review using our clinic review tool, if applicable. No additional management support is needed unless otherwise documented below in the visit note. 

## 2015-01-16 ENCOUNTER — Telehealth (HOSPITAL_COMMUNITY): Payer: Self-pay | Admitting: Cardiology

## 2015-01-16 LAB — POCT INR: INR: 1.8

## 2015-01-16 NOTE — Telephone Encounter (Signed)
Received fax from [facility] with INR/PT results  Date reported 01/16/2015  INR- 1.8 PT-18.9  Will forward to PCP as they manage coumadin

## 2015-01-17 ENCOUNTER — Ambulatory Visit (INDEPENDENT_AMBULATORY_CARE_PROVIDER_SITE_OTHER): Payer: Medicare Other | Admitting: General Practice

## 2015-01-17 ENCOUNTER — Ambulatory Visit: Payer: Medicare Other

## 2015-01-17 ENCOUNTER — Ambulatory Visit (INDEPENDENT_AMBULATORY_CARE_PROVIDER_SITE_OTHER): Payer: Medicare Other | Admitting: Family Medicine

## 2015-01-17 ENCOUNTER — Encounter: Payer: Self-pay | Admitting: Family Medicine

## 2015-01-17 VITALS — BP 110/82 | HR 89 | Temp 97.9°F | Wt 146.0 lb

## 2015-01-17 DIAGNOSIS — E038 Other specified hypothyroidism: Secondary | ICD-10-CM | POA: Diagnosis not present

## 2015-01-17 DIAGNOSIS — Z5181 Encounter for therapeutic drug level monitoring: Secondary | ICD-10-CM

## 2015-01-17 DIAGNOSIS — E034 Atrophy of thyroid (acquired): Secondary | ICD-10-CM

## 2015-01-17 DIAGNOSIS — I10 Essential (primary) hypertension: Secondary | ICD-10-CM

## 2015-01-17 LAB — POCT INR: INR: 2.6

## 2015-01-17 MED ORDER — TRIAMCINOLONE ACETONIDE 0.1 % EX CREA: 1.0000 "application " | TOPICAL_CREAM | Freq: Two times a day (BID) | CUTANEOUS | Status: DC

## 2015-01-17 NOTE — Assessment & Plan Note (Signed)
S: controlled on torsemide only. Formerly on Benazepril, coreg but blood pressure can no longer tolerate. On dobutamine for cardiac support.  BP Readings from Last 3 Encounters:  01/17/15 110/82  12/25/14 120/82  12/25/14 110/68  A/P:close cardiology follow up. Hopeful solution can be found to get her out of a fib.

## 2015-01-17 NOTE — Patient Instructions (Signed)
Send in steroid cream (triamcinolone cream) for the rash on upper back/neck and for eczema  So sorry you have had such a tough last few months. You will be in my thoughts/prayers.

## 2015-01-17 NOTE — Assessment & Plan Note (Signed)
S: controlled. On synthroid 19mcg  Lab Results  Component Value Date   TSH 1.552 10/30/2014  A/P:Continue current meds:  Consider repeat in 4 months

## 2015-01-17 NOTE — Progress Notes (Signed)
Garret Reddish, MD  Subjective:  Sandra [name] is a 82 y.o. year old very pleasant female patient who presents for/with See problem oriented charting ROS- extreme fatigue. Short of breath with activity. No chest pain. Wearing her dobutamine pump.   Past Medical History-  Patient Active Problem List   Diagnosis Date Noted  . Normal coronary arteries 2004 04/04/2014    Priority: High  . Chronic systolic heart failure ([place]) 12/18/2010    Priority: High  . Biventricular implantable cardioverter-defibrillator -St. Jude 05/15/2010    Priority: High  . Hypertrophic obstructive cardiomyopathy ([place]) 02/08/2007    Priority: High  . Essential hypertension, benign 10/16/2013    Priority: Medium  . Gout 10/16/2013    Priority: Medium  . Anxiety 03/31/2010    Priority: Medium  . Chronic kidney disease, stage III (moderate) 12/30/2009    Priority: Medium  . Hypothyroidism 07/08/2006    Priority: Medium  . Hyperlipidemia 07/08/2006    Priority: Medium  . Encounter for therapeutic drug monitoring 02/13/2013    Priority: Low  . Dyspnea 12/07/2011    Priority: Low  . RIATA Lead 03/23/2011    Priority: Low  . History of colonic polyps 09/10/2008    Priority: Low  . Fibrocystic breast disease 09/17/1991    Priority: Low  . Atrial fibrillation ([place]) 01/04/2015  . Drug rash 11/29/2014  . RBBB plus LA hemiblock   . Persistent atrial fibrillation ([place])   . Acute on chronic systolic congestive heart failure ([place]) 10/29/2014  . Cardiogenic shock (HCC)     Medications- reviewed and updated Current Outpatient Prescriptions  Medication Sig Dispense Refill  . DOBUTamine (DOBUTREX) 4-5 MG/ML-% infusion Inject 330 mcg/min into the vein continuous. 250 mL 6  . ezetimibe (ZETIA) 10 MG tablet Take 10 mg by mouth every Monday, Wednesday, and Friday.     . fluticasone (FLONASE) 50 MCG/ACT nasal spray Place 2 sprays into both nostrils daily. 16 g 6  . SYNTHROID 100 MCG tablet TAKE ONE TABLET BY  MOUTH ONCE DAILY BEFORE  BREAKFAST (Patient taking differently: TAKE ONE TABLET BY MOUTH ONCE DAILY AT BEDTIME) 90 tablet 3  . torsemide (DEMADEX) 20 MG tablet Take 40 mg daily. Take an extra 20 mg of torsemide in afternoon as needed. 90 tablet 6  . warfarin (COUMADIN) 5 MG tablet Take 5 mg Mon Wed and Fri. Take 2.5 mg on all other days. 90 tablet 1  . clorazepate (TRANXENE) 7.5 MG tablet Take 1 tablet (7.5 mg total) by mouth 2 (two) times daily as needed for anxiety. (Patient not taking: Reported on 01/17/2015) 60 tablet 2  . triamcinolone cream (KENALOG) 0.1 % Apply 1 application topically 2 (two) times daily. 2 weeks max. For rash on neck and when eczema flares. 80 g 1   No current facility-administered medications for this visit.    Objective: BP 110/82 mmHg  Pulse 89  Temp(Src) 97.9 F (36.6 C)  Wt 146 lb (66.225 kg) Gen: NAD, appears fatigued, wearing dobutamine pump CV: irregularly irregular no murmurs rubs or gallops Lungs: CTAB no crackles, wheeze, rhonchi Abdomen: soft/nontender/nondistended/normal bowel sounds.  Ext: no edema Skin: warm, dry Neuro: grossly normal, moves all extremities   Assessment/Plan:  Essential hypertension, benign S: controlled on torsemide only. Formerly on Benazepril, coreg but blood pressure can no longer tolerate. On dobutamine for cardiac support.  BP Readings from Last 3 Encounters:  01/17/15 110/82  12/25/14 120/82  12/25/14 110/68  A/P:close cardiology follow up. Hopeful solution can be found to  get her out of a fib.    Hypothyroidism S: controlled. On synthroid 145mcg  Lab Results  Component Value Date   TSH 1.552 10/30/2014  A/P:Continue current meds:  Consider repeat in 4 months   Also has rash on upper back. Unclear etiology. Mild erythema with no scale (has used cortisone which is helping). History of eczema. We sent in some triamcinolone which she can use for eczema as well as for the rash on her neck.   4 months Return  precautions advised.   Meds ordered this encounter  Medications  . triamcinolone cream (KENALOG) 0.1 %    Sig: Apply 1 application topically 2 (two) times daily. 2 weeks max. For rash on neck and when eczema flares.    Dispense:  80 g    Refill:  1

## 2015-01-17 NOTE — Progress Notes (Signed)
Pre visit review using our clinic review tool, if applicable. No additional management support is needed unless otherwise documented below in the visit note. 

## 2015-01-21 ENCOUNTER — Other Ambulatory Visit (HOSPITAL_COMMUNITY): Payer: Self-pay | Admitting: Vascular Surgery

## 2015-01-21 ENCOUNTER — Other Ambulatory Visit (HOSPITAL_COMMUNITY): Payer: Self-pay | Admitting: Internal Medicine

## 2015-01-22 ENCOUNTER — Telehealth (HOSPITAL_COMMUNITY): Payer: Self-pay | Admitting: Cardiology

## 2015-01-22 NOTE — Telephone Encounter (Signed)
Results received from [facility] Potassium 1/4 = 4.4 1/9 = 5.0  Per vo Oda Kilts, PA Patient should avoid foods high in potassium/ salt substitutes ( ms. Dash)  Pt aware and voiced understanding Pt states she normally has a banana daily and will season with Mrs. Dash daily Will not use until labs a re rechecked

## 2015-01-25 ENCOUNTER — Ambulatory Visit (INDEPENDENT_AMBULATORY_CARE_PROVIDER_SITE_OTHER): Payer: Medicare Other | Admitting: General Practice

## 2015-01-25 ENCOUNTER — Other Ambulatory Visit (HOSPITAL_COMMUNITY): Payer: Self-pay | Admitting: Internal Medicine

## 2015-01-25 DIAGNOSIS — Z5181 Encounter for therapeutic drug level monitoring: Secondary | ICD-10-CM

## 2015-01-25 LAB — POCT INR: INR: 2.4

## 2015-01-25 NOTE — Progress Notes (Signed)
I have reviewed and agree with the plan. 

## 2015-01-25 NOTE — Progress Notes (Signed)
Pre visit review using our clinic review tool, if applicable. No additional management support is needed unless otherwise documented below in the visit note. 

## 2015-01-27 ENCOUNTER — Encounter (HOSPITAL_COMMUNITY)
Admission: AD | Admit: 2015-01-27 | Discharge: 2015-01-27 | Disposition: A | Discharge status: Home / Self Care | Payer: Medicare Other | Source: Skilled Nursing Facility | Attending: Internal Medicine | Admitting: Internal Medicine

## 2015-01-27 DIAGNOSIS — Z5181 Encounter for therapeutic drug level monitoring: Secondary | ICD-10-CM | POA: Insufficient documentation

## 2015-01-27 LAB — BASIC METABOLIC PANEL
ANION GAP: 13 (ref 5–15)
BUN: 38 mg/dL — AB (ref 6–20)
CALCIUM: 9.3 mg/dL (ref 8.9–10.3)
CO2: 25 mmol/L (ref 22–32)
CREATININE: 1.86 mg/dL — AB (ref 0.44–1.00)
Chloride: 103 mmol/L (ref 101–111)
GFR calc Af Amer: 28 mL/min — ABNORMAL LOW (ref >60)
GFR calc non Af Amer: 24 mL/min — ABNORMAL LOW (ref >60)
Glucose, Bld: 78 mg/dL (ref 65–99)
Potassium: 3.7 mmol/L (ref 3.5–5.1)
Sodium: 141 mmol/L (ref 135–145)

## 2015-01-27 LAB — CBC WITH DIFFERENTIAL/PLATELET
BASOS ABS: 0 10*3/uL (ref 0.0–0.1)
BASOS PCT: 1 %
EOS ABS: 0.1 10*3/uL (ref 0.0–0.7)
EOS PCT: 2 %
HEMATOCRIT: 33.9 % — AB (ref 36.0–46.0)
Hemoglobin: 10.8 g/dL — ABNORMAL LOW (ref 12.0–15.0)
Lymphocytes Relative: 12 %
Lymphs Abs: 0.7 10*3/uL (ref 0.7–4.0)
MCH: 27 pg (ref 26.0–34.0)
MCHC: 31.9 g/dL (ref 30.0–36.0)
MCV: 84.8 fL (ref 78.0–100.0)
MONO ABS: 0.7 10*3/uL (ref 0.1–1.0)
MONOS PCT: 12 %
NEUTROS ABS: 4 10*3/uL (ref 1.7–7.7)
Neutrophils Relative %: 73 %
Platelets: 233 10*3/uL (ref 150–400)
RBC: 4 MIL/uL (ref 3.87–5.11)
RDW: 16.5 % — AB (ref 11.5–15.5)
WBC: 5.5 10*3/uL (ref 4.0–10.5)

## 2015-01-27 LAB — MAGNESIUM: Magnesium: 2.3 mg/dL (ref 1.7–2.4)

## 2015-02-02 ENCOUNTER — Other Ambulatory Visit (HOSPITAL_COMMUNITY)
Admission: AD | Admit: 2015-02-02 | Discharge: 2015-02-02 | Disposition: A | Discharge status: Home / Self Care | Payer: Medicare Other | Source: Other Acute Inpatient Hospital | Attending: Internal Medicine | Admitting: Internal Medicine

## 2015-02-02 DIAGNOSIS — Z5181 Encounter for therapeutic drug level monitoring: Secondary | ICD-10-CM | POA: Insufficient documentation

## 2015-02-02 LAB — BASIC METABOLIC PANEL
ANION GAP: 12 (ref 5–15)
BUN: 36 mg/dL — ABNORMAL HIGH (ref 6–20)
CALCIUM: 9.5 mg/dL (ref 8.9–10.3)
CHLORIDE: 101 mmol/L (ref 101–111)
CO2: 27 mmol/L (ref 22–32)
CREATININE: 1.92 mg/dL — AB (ref 0.44–1.00)
GFR calc non Af Amer: 24 mL/min — ABNORMAL LOW (ref >60)
GFR, EST AFRICAN AMERICAN: 27 mL/min — AB (ref >60)
Glucose, Bld: 83 mg/dL (ref 65–99)
Potassium: 3.8 mmol/L (ref 3.5–5.1)
Sodium: 140 mmol/L (ref 135–145)

## 2015-02-02 LAB — CBC WITH DIFFERENTIAL/PLATELET
BASOS ABS: 0 10*3/uL (ref 0.0–0.1)
BASOS PCT: 1 %
EOS ABS: 0.2 10*3/uL (ref 0.0–0.7)
EOS PCT: 4 %
HEMATOCRIT: 36.2 % (ref 36.0–46.0)
Hemoglobin: 11.2 g/dL — ABNORMAL LOW (ref 12.0–15.0)
Lymphocytes Relative: 14 %
Lymphs Abs: 0.8 10*3/uL (ref 0.7–4.0)
MCH: 26.4 pg (ref 26.0–34.0)
MCHC: 30.9 g/dL (ref 30.0–36.0)
MCV: 85.2 fL (ref 78.0–100.0)
Monocytes Absolute: 0.6 10*3/uL (ref 0.1–1.0)
Monocytes Relative: 10 %
Neutro Abs: 3.9 10*3/uL (ref 1.7–7.7)
Neutrophils Relative %: 71 %
PLATELETS: 219 10*3/uL (ref 150–400)
RBC: 4.25 MIL/uL (ref 3.87–5.11)
RDW: 16.7 % — AB (ref 11.5–15.5)
WBC: 5.5 10*3/uL (ref 4.0–10.5)

## 2015-02-02 LAB — MAGNESIUM: Magnesium: 2.4 mg/dL (ref 1.7–2.4)

## 2015-02-05 ENCOUNTER — Ambulatory Visit (HOSPITAL_COMMUNITY)
Admission: RE | Admit: 2015-02-05 | Discharge: 2015-02-05 | Disposition: A | Discharge status: Home / Self Care | Payer: Medicare Other | Source: Ambulatory Visit | Attending: Internal Medicine | Admitting: Internal Medicine

## 2015-02-05 VITALS — BP 102/60 | HR 109 | Wt 146.5 lb

## 2015-02-05 DIAGNOSIS — Z95810 Presence of automatic (implantable) cardiac defibrillator: Secondary | ICD-10-CM | POA: Insufficient documentation

## 2015-02-05 DIAGNOSIS — I130 Hypertensive heart and chronic kidney disease with heart failure and stage 1 through stage 4 chronic kidney disease, or unspecified chronic kidney disease: Secondary | ICD-10-CM | POA: Insufficient documentation

## 2015-02-05 DIAGNOSIS — Z885 Allergy status to narcotic agent status: Secondary | ICD-10-CM | POA: Insufficient documentation

## 2015-02-05 DIAGNOSIS — Z79899 Other long term (current) drug therapy: Secondary | ICD-10-CM | POA: Diagnosis not present

## 2015-02-05 DIAGNOSIS — N183 Chronic kidney disease, stage 3 unspecified: Secondary | ICD-10-CM

## 2015-02-05 DIAGNOSIS — I428 Other cardiomyopathies: Secondary | ICD-10-CM | POA: Insufficient documentation

## 2015-02-05 DIAGNOSIS — N184 Chronic kidney disease, stage 4 (severe): Secondary | ICD-10-CM | POA: Insufficient documentation

## 2015-02-05 DIAGNOSIS — Z881 Allergy status to other antibiotic agents status: Secondary | ICD-10-CM | POA: Insufficient documentation

## 2015-02-05 DIAGNOSIS — Z7901 Long term (current) use of anticoagulants: Secondary | ICD-10-CM | POA: Diagnosis not present

## 2015-02-05 DIAGNOSIS — I481 Persistent atrial fibrillation: Secondary | ICD-10-CM

## 2015-02-05 DIAGNOSIS — Z8249 Family history of ischemic heart disease and other diseases of the circulatory system: Secondary | ICD-10-CM | POA: Diagnosis not present

## 2015-02-05 DIAGNOSIS — Z825 Family history of asthma and other chronic lower respiratory diseases: Secondary | ICD-10-CM | POA: Insufficient documentation

## 2015-02-05 DIAGNOSIS — I4819 Other persistent atrial fibrillation: Secondary | ICD-10-CM

## 2015-02-05 DIAGNOSIS — Z87891 Personal history of nicotine dependence: Secondary | ICD-10-CM | POA: Diagnosis not present

## 2015-02-05 DIAGNOSIS — E785 Hyperlipidemia, unspecified: Secondary | ICD-10-CM | POA: Diagnosis not present

## 2015-02-05 DIAGNOSIS — E039 Hypothyroidism, unspecified: Secondary | ICD-10-CM | POA: Diagnosis not present

## 2015-02-05 DIAGNOSIS — I5023 Acute on chronic systolic (congestive) heart failure: Secondary | ICD-10-CM | POA: Diagnosis not present

## 2015-02-05 DIAGNOSIS — Z888 Allergy status to other drugs, medicaments and biological substances status: Secondary | ICD-10-CM | POA: Insufficient documentation

## 2015-02-05 LAB — BASIC METABOLIC PANEL
Anion gap: 15 (ref 5–15)
BUN: 36 mg/dL — AB (ref 6–20)
CHLORIDE: 101 mmol/L (ref 101–111)
CO2: 24 mmol/L (ref 22–32)
CREATININE: 1.94 mg/dL — AB (ref 0.44–1.00)
Calcium: 9.5 mg/dL (ref 8.9–10.3)
GFR calc Af Amer: 27 mL/min — ABNORMAL LOW (ref >60)
GFR, EST NON AFRICAN AMERICAN: 23 mL/min — AB (ref >60)
GLUCOSE: 117 mg/dL — AB (ref 65–99)
POTASSIUM: 3.5 mmol/L (ref 3.5–5.1)
SODIUM: 140 mmol/L (ref 135–145)

## 2015-02-05 LAB — PROTIME-INR
INR: 1.91 — ABNORMAL HIGH (ref 0.00–1.49)
PROTHROMBIN TIME: 21.8 s — AB (ref 11.6–15.2)

## 2015-02-05 LAB — CBC
HCT: 35.6 % — ABNORMAL LOW (ref 36.0–46.0)
Hemoglobin: 11.1 g/dL — ABNORMAL LOW (ref 12.0–15.0)
MCH: 26.2 pg (ref 26.0–34.0)
MCHC: 31.2 g/dL (ref 30.0–36.0)
MCV: 84 fL (ref 78.0–100.0)
PLATELETS: 203 10*3/uL (ref 150–400)
RBC: 4.24 MIL/uL (ref 3.87–5.11)
RDW: 16.8 % — ABNORMAL HIGH (ref 11.5–15.5)
WBC: 5.3 10*3/uL (ref 4.0–10.5)

## 2015-02-05 MED ORDER — TORSEMIDE 20 MG PO TABS: ORAL_TABLET | ORAL | Status: DC

## 2015-02-05 MED ORDER — POTASSIUM CHLORIDE ER 10 MEQ PO TBCR: 10.0000 meq | EXTENDED_RELEASE_TABLET | Freq: Every day | ORAL | Status: DC

## 2015-02-05 NOTE — Patient Instructions (Signed)
Increase Torsemide to 40 mg in AM and 20 mg in PM  Start Potassium (k-dur) 10 meq daily  Labs today  You have been referred to Dr Amil Amen, for gout  Your physician recommends that you schedule a follow-up appointment in: 2 weeks

## 2015-02-05 NOTE — Progress Notes (Signed)
Patient ID: Sandra [name], female   DOB: 07/02/1932, 82 y.o.   MRN: [id number]    Advanced Heart Failure Clinic Note   Primary Care: Primary EP: Dr. Caryl Comes Primary HF: Dr. Haroldine Laws   HPI:  Sandra [name] is a 82 y.o. female with h/o PAF, hypothyroidism, St Jude CRT-D CKD stage 4 and chronic systolic HF due to NICM with EF 25%. Tikosyn previously held with worsening renal function.   Admitted 10/29/14 after [place] with cardiogenic shock. Functional decline correlated with recurrence of A fib.  Placed on milrinone and diuresed with IV lasix. Due to persistent low output she was switched from milrinone to dobutamine with improved hemodynamics. Dobutamine was unable to be weaned.  Continued on dobutamine 5 mcg via AHC.  No ace or bb with low output/CKD.  EP consulted for AVN ablation; Deferred in favor of Tikosyn and repeat DC-CV. Tikosyn was reloaded and cardioversion initially successful, but reverted to A fib with controlled rate prior to d/c. Tikosyn stopped. [facility]. Overall she diuresed 11 pounds with IV lasix and was transitioned to 40 mg daily torsemide. Discharge weight 140  She presents today for regular follow up. Feels terrible. Very weak. Very little energy. Can barely walk across the room. + ankle edema. No palpitations but taking pulse frequently it goes up and down between 60-135. Watching diet closely but still gaining weight. Has taken extra torsemide with variable benefit. Remains on dobutamine 4 mcg/kg/min   Corvue: Chronic AF 100% time. Biv pacing down to 12% AF rates mostly under 110 but up to 140. Corevue with marked volume overload.   [place] 10/29/2014  RA = 19 RV = 70/9/19 PA = 69/31 (45) PCW = 27 Fick cardiac output/index = 2.3/1.4 Thermo cardiac output/index = 2.3/1.4 PVR = 7.8 WU Ao sat = 94% PA sat = 35%, 38%  Labs 11/25/14 K 4.8, Cr 1.7  Past Medical History  Diagnosis Date  . Atrial fibrillation ([place]) permanent     on Tikosyn and  Coumadin  . Hyperlipidemia   . Hypothyroidism   . Bradycardia   . Cardiomyopathy, hypertrophic nonobstructive (HCC)     ejection fraction 25%  . Anal fissure   . Adenomatous colon polyp   . Diverticulosis   . ICD-CRT     generator change 2013  . Systolic heart failure   . History of colonoscopy   . Migraines   . Hyperlipidemia   . Hypertension   . Chronic renal insufficiency, stage III (moderate)     gd 3-4  . RBBB plus LA hemiblock     Current Outpatient Prescriptions  Medication Sig Dispense Refill  . clorazepate (TRANXENE) 7.5 MG tablet Take 1 tablet (7.5 mg total) by mouth 2 (two) times daily as needed for anxiety. 60 tablet 2  . DOBUTamine (DOBUTREX) 4-5 MG/ML-% infusion Inject 330 mcg/min into the vein continuous. 250 mL 6  . ezetimibe (ZETIA) 10 MG tablet Take 10 mg by mouth every Monday, Wednesday, and Friday.     . fluticasone (FLONASE) 50 MCG/ACT nasal spray Place 2 sprays into both nostrils daily. 16 g 6  . SYNTHROID 100 MCG tablet TAKE ONE TABLET BY MOUTH ONCE DAILY BEFORE  BREAKFAST (Patient taking differently: TAKE ONE TABLET BY MOUTH ONCE DAILY AT BEDTIME) 90 tablet 3  . torsemide (DEMADEX) 20 MG tablet Take 40 mg daily. Take an extra 20 mg of torsemide in afternoon as needed. 90 tablet 6  . triamcinolone cream (KENALOG) 0.1 % Apply  1 application topically 2 (two) times daily. 2 weeks max. For rash on neck and when eczema flares. 80 g 1  . warfarin (COUMADIN) 5 MG tablet Take 5 mg Mon Wed and Fri. Take 2.5 mg on all other days. 90 tablet 1   No current facility-administered medications for this encounter.    Allergies  Allergen Reactions  . Levofloxacin Other (See Comments)    Due to cardiac  AF  . Procaine Hcl Other (See Comments)    REACTION: smothers  . Spironolactone Diarrhea    Abdominal pain  . Amiodarone Nausea And Vomiting  . Amoxicillin Other (See Comments)    Rash   . Codeine Other (See Comments)    REACTION: nausea  . Niacin Other (See  Comments)    REACTION: rash  . Ramipril Other (See Comments)    unknown  . Statins Other (See Comments)    REACTION: muscle aches      Social History   Social History  . Marital Status: Married    Spouse Name: N/A  . Number of Children: 3  . Years of Education: N/A   Occupational History  . Retired     Probation officer   Social History Main Topics  . Smoking status: Former Smoker -- 0.20 packs/day for 19 years    Types: Cigarettes    Quit date: 01/13/1987  . Smokeless tobacco: Never Used  . Alcohol Use: No  . Drug Use: No  . Sexual Activity: Not Currently   Other Topics Concern  . Not on file   Social History Narrative   Married 1953 (husband Clare Gandy in our Network engineer). 3 girls (1 is a patient here-[name]). 2 grandkids.       Retired in [place]: very active, going to church and church friends, movies      Family History  Problem Relation Age of Onset  . Heart disease Mother   . Colon cancer Father   . COPD Father   . Heart disease Father   . Cardiomyopathy Daughter   . Breast cancer Paternal Aunt   . Colon polyps Father   . Diabetes Neg Hx   . Kidney disease Neg Hx     Filed Vitals:   02/05/15 1001  BP: 102/60  Pulse: 109  Weight: 146 lb 8 oz (66.452 kg)  SpO2: 97%      PHYSICAL EXAM: General:  Fatigued appearing. NAD HEENT: normal Neck: supple. JVP to jaw . Carotids 2+ bilat; no bruits. No thyromegaly or nodule noted. Cor: PMI laterally displaced. Irregularly irregular.  Lungs: CTA, normal effort. Abdomen: soft, nontender, + mildly distended, no HSM. No bruits or masses. +BS Extremities: no cyanosis, clubbing, rash, 1+ edema Neuro: alert & oriented x 3, cranial nerves grossly intact. moves all 4 extremities w/o difficulty. Affect pleasant. Skin: no rash.  ASSESSMENT & PLAN:  1. Acute on chronic systolic HF due to NICM, Echo 10/2014 EF 20-25% s/p St Jude CRT-D  - She is markedly worse in the setting of increased AF  burden and decrease in biv pacing. NYHA 4 with volume overload despite dobutamine (on exam and by Corevue) - Will increase torsemide to 40/20. Start kcl 10 bid (last k was 3.8 on 1/21). - I asked Chanetta Marshall NP from EP to see her with me in clinic today and will plan on AVN ablation next week with replacement of recalled lead with Dr. Caryl Comes 2. PAF -S/P DC-CV 10/28 but later back in  A fib Failed amio and Tikosyn. Plan AVN ablation next week.  3. CKD, stage 4 -stable. Repeat today 4. Hypothyroidism  RTC in 2 weeks. Discussed timing of AVN ablation and that this is really last option for her advanced HF. Continue dobutamine for now.    Costa Jha,MD 10:22 AM

## 2015-02-06 ENCOUNTER — Ambulatory Visit (HOSPITAL_COMMUNITY)
Admission: RE | Admit: 2015-02-06 | Discharge: 2015-02-07 | Disposition: A | Discharge status: Home / Self Care | Payer: Medicare Other | Source: Ambulatory Visit | Attending: Internal Medicine | Admitting: Internal Medicine

## 2015-02-06 ENCOUNTER — Encounter (HOSPITAL_COMMUNITY)
Admission: RE | Disposition: A | Discharge status: Home / Self Care | Payer: Medicare Other | Source: Ambulatory Visit | Attending: Internal Medicine

## 2015-02-06 ENCOUNTER — Encounter (HOSPITAL_COMMUNITY): Payer: Self-pay | Admitting: General Practice

## 2015-02-06 ENCOUNTER — Ambulatory Visit (HOSPITAL_COMMUNITY): Payer: Medicare Other

## 2015-02-06 DIAGNOSIS — I509 Heart failure, unspecified: Secondary | ICD-10-CM

## 2015-02-06 DIAGNOSIS — Z7901 Long term (current) use of anticoagulants: Secondary | ICD-10-CM | POA: Diagnosis not present

## 2015-02-06 DIAGNOSIS — Z7951 Long term (current) use of inhaled steroids: Secondary | ICD-10-CM | POA: Diagnosis not present

## 2015-02-06 DIAGNOSIS — N183 Chronic kidney disease, stage 3 (moderate): Secondary | ICD-10-CM | POA: Diagnosis not present

## 2015-02-06 DIAGNOSIS — I5023 Acute on chronic systolic (congestive) heart failure: Secondary | ICD-10-CM | POA: Insufficient documentation

## 2015-02-06 DIAGNOSIS — Z95810 Presence of automatic (implantable) cardiac defibrillator: Secondary | ICD-10-CM | POA: Diagnosis not present

## 2015-02-06 DIAGNOSIS — I482 Chronic atrial fibrillation: Secondary | ICD-10-CM | POA: Diagnosis not present

## 2015-02-06 DIAGNOSIS — E785 Hyperlipidemia, unspecified: Secondary | ICD-10-CM | POA: Insufficient documentation

## 2015-02-06 DIAGNOSIS — Z79899 Other long term (current) drug therapy: Secondary | ICD-10-CM | POA: Insufficient documentation

## 2015-02-06 DIAGNOSIS — I421 Obstructive hypertrophic cardiomyopathy: Secondary | ICD-10-CM | POA: Diagnosis not present

## 2015-02-06 DIAGNOSIS — I130 Hypertensive heart and chronic kidney disease with heart failure and stage 1 through stage 4 chronic kidney disease, or unspecified chronic kidney disease: Secondary | ICD-10-CM | POA: Insufficient documentation

## 2015-02-06 DIAGNOSIS — I452 Bifascicular block: Secondary | ICD-10-CM | POA: Insufficient documentation

## 2015-02-06 DIAGNOSIS — E039 Hypothyroidism, unspecified: Secondary | ICD-10-CM | POA: Diagnosis not present

## 2015-02-06 DIAGNOSIS — I4819 Other persistent atrial fibrillation: Secondary | ICD-10-CM | POA: Diagnosis present

## 2015-02-06 DIAGNOSIS — I481 Persistent atrial fibrillation: Secondary | ICD-10-CM | POA: Insufficient documentation

## 2015-02-06 DIAGNOSIS — Z95828 Presence of other vascular implants and grafts: Secondary | ICD-10-CM

## 2015-02-06 HISTORY — DX: Other specified anxiety disorders: F41.8

## 2015-02-06 HISTORY — DX: Basal cell carcinoma of skin, unspecified: C44.91

## 2015-02-06 HISTORY — PX: AV NODE ABLATION: SHX1209

## 2015-02-06 HISTORY — DX: Heart failure, unspecified: I50.9

## 2015-02-06 HISTORY — DX: Family history of other specified conditions: Z84.89

## 2015-02-06 HISTORY — DX: Asymptomatic varicose veins of unspecified lower extremity: I83.90

## 2015-02-06 HISTORY — DX: Personal history of other diseases of the musculoskeletal system and connective tissue: Z87.39

## 2015-02-06 HISTORY — DX: Unspecified chronic bronchitis: J42

## 2015-02-06 HISTORY — PX: ELECTROPHYSIOLOGIC STUDY: SHX172A

## 2015-02-06 LAB — PROTIME-INR
INR: 2.13 — ABNORMAL HIGH (ref 0.00–1.49)
PROTHROMBIN TIME: 23.7 s — AB (ref 11.6–15.2)

## 2015-02-06 LAB — SURGICAL PCR SCREEN
MRSA, PCR: NEGATIVE
Staphylococcus aureus: NEGATIVE

## 2015-02-06 SURGERY — AV NODE ABLATION

## 2015-02-06 MED ORDER — CHLORHEXIDINE GLUCONATE 4 % EX LIQD
60.0000 mL | Freq: Once | CUTANEOUS | Status: DC
  Filled 2015-02-06: qty 60

## 2015-02-06 MED ORDER — SODIUM CHLORIDE 0.9% FLUSH: 3.0000 mL | INTRAVENOUS | Status: DC | PRN

## 2015-02-06 MED ORDER — LEVOTHYROXINE SODIUM 100 MCG PO TABS
100.0000 ug | ORAL_TABLET | Freq: Every day | ORAL | Status: DC
  Administered 2015-02-07: 07:00:00 100 ug via ORAL
  Filled 2015-02-06: qty 1

## 2015-02-06 MED ORDER — GENTAMICIN SULFATE 40 MG/ML IJ SOLN: 80.0000 mg | INTRAMUSCULAR | Status: DC

## 2015-02-06 MED ORDER — ONDANSETRON HCL 4 MG/2ML IJ SOLN: 4.0000 mg | Freq: Four times a day (QID) | INTRAMUSCULAR | Status: DC | PRN

## 2015-02-06 MED ORDER — BUPIVACAINE HCL (PF) 0.25 % IJ SOLN
INTRAMUSCULAR | Status: DC | PRN
  Administered 2015-02-06: 30 mL

## 2015-02-06 MED ORDER — SODIUM CHLORIDE 0.9 % IV SOLN: 250.0000 mL | INTRAVENOUS | Status: DC | PRN

## 2015-02-06 MED ORDER — ALTEPLASE 2 MG IJ SOLR
2.0000 mg | Freq: Once | INTRAMUSCULAR | Status: AC
  Administered 2015-02-06: 2 mg
  Filled 2015-02-06: qty 2

## 2015-02-06 MED ORDER — WARFARIN - PHYSICIAN DOSING INPATIENT: Freq: Every day | Status: DC

## 2015-02-06 MED ORDER — WARFARIN SODIUM 5 MG PO TABS: 2.5000 mg | ORAL_TABLET | ORAL | Status: DC

## 2015-02-06 MED ORDER — EZETIMIBE 10 MG PO TABS
10.0000 mg | ORAL_TABLET | ORAL | Status: DC
  Administered 2015-02-06: 20:00:00 10 mg via ORAL
  Filled 2015-02-06 (×2): qty 1

## 2015-02-06 MED ORDER — TORSEMIDE 20 MG PO TABS: 20.0000 mg | ORAL_TABLET | Freq: Every day | ORAL | Status: DC

## 2015-02-06 MED ORDER — HEPARIN SODIUM (PORCINE) 1000 UNIT/ML IJ SOLN
INTRAMUSCULAR | Status: AC
  Filled 2015-02-06: qty 1

## 2015-02-06 MED ORDER — MIDAZOLAM HCL 5 MG/5ML IJ SOLN
INTRAMUSCULAR | Status: DC | PRN
  Administered 2015-02-06 (×2): 1 mg via INTRAVENOUS

## 2015-02-06 MED ORDER — MIDAZOLAM HCL 5 MG/5ML IJ SOLN
INTRAMUSCULAR | Status: AC
  Filled 2015-02-06: qty 5

## 2015-02-06 MED ORDER — TORSEMIDE 20 MG PO TABS
20.0000 mg | ORAL_TABLET | Freq: Every evening | ORAL | Status: DC
  Filled 2015-02-06: qty 1

## 2015-02-06 MED ORDER — SODIUM CHLORIDE 0.9 % IV SOLN
INTRAVENOUS | Status: DC
  Administered 2015-02-06: 13:00:00 via INTRAVENOUS

## 2015-02-06 MED ORDER — POTASSIUM CHLORIDE ER 10 MEQ PO TBCR
10.0000 meq | EXTENDED_RELEASE_TABLET | Freq: Every day | ORAL | Status: DC
  Administered 2015-02-06: 10 meq via ORAL
  Filled 2015-02-06 (×4): qty 1

## 2015-02-06 MED ORDER — SODIUM CHLORIDE 0.9% FLUSH: 10.0000 mL | INTRAVENOUS | Status: DC | PRN

## 2015-02-06 MED ORDER — ACETAMINOPHEN 325 MG PO TABS: 650.0000 mg | ORAL_TABLET | ORAL | Status: DC | PRN

## 2015-02-06 MED ORDER — TORSEMIDE 20 MG PO TABS
20.0000 mg | ORAL_TABLET | Freq: Every day | ORAL | Status: DC
  Filled 2015-02-06: qty 1

## 2015-02-06 MED ORDER — SODIUM CHLORIDE 0.9% FLUSH: 3.0000 mL | Freq: Two times a day (BID) | INTRAVENOUS | Status: DC

## 2015-02-06 MED ORDER — TORSEMIDE 20 MG PO TABS
40.0000 mg | ORAL_TABLET | Freq: Once | ORAL | Status: AC
  Administered 2015-02-06: 40 mg via ORAL
  Filled 2015-02-06: qty 2

## 2015-02-06 MED ORDER — HEPARIN (PORCINE) IN NACL 2-0.9 UNIT/ML-% IJ SOLN
INTRAMUSCULAR | Status: AC
  Filled 2015-02-06: qty 500

## 2015-02-06 MED ORDER — VANCOMYCIN HCL IN DEXTROSE 1-5 GM/200ML-% IV SOLN
1000.0000 mg | INTRAVENOUS | Status: DC
  Filled 2015-02-06: qty 200

## 2015-02-06 MED ORDER — FENTANYL CITRATE (PF) 100 MCG/2ML IJ SOLN
INTRAMUSCULAR | Status: DC | PRN
  Administered 2015-02-06 (×2): 25 ug via INTRAVENOUS

## 2015-02-06 MED ORDER — DOBUTAMINE IN D5W 4-5 MG/ML-% IV SOLN
5.0000 ug/kg/min | INTRAVENOUS | Status: DC
  Administered 2015-02-06: 5 ug/kg/min via INTRAVENOUS
  Filled 2015-02-06: qty 250

## 2015-02-06 MED ORDER — CLORAZEPATE DIPOTASSIUM 3.75 MG PO TABS
7.5000 mg | ORAL_TABLET | Freq: Two times a day (BID) | ORAL | Status: DC | PRN
  Filled 2015-02-06: qty 2

## 2015-02-06 MED ORDER — WARFARIN SODIUM 5 MG PO TABS: 2.5000 mg | ORAL_TABLET | Freq: Every day | ORAL | Status: DC

## 2015-02-06 MED ORDER — WARFARIN SODIUM 5 MG PO TABS
5.0000 mg | ORAL_TABLET | ORAL | Status: AC
  Administered 2015-02-06: 5 mg via ORAL
  Filled 2015-02-06: qty 1

## 2015-02-06 MED ORDER — FLUTICASONE PROPIONATE 50 MCG/ACT NA SUSP: 2.0000 | Freq: Every day | NASAL | Status: DC

## 2015-02-06 MED ORDER — TORSEMIDE 20 MG PO TABS
40.0000 mg | ORAL_TABLET | Freq: Every morning | ORAL | Status: DC
  Filled 2015-02-06: qty 2

## 2015-02-06 MED ORDER — FENTANYL CITRATE (PF) 100 MCG/2ML IJ SOLN
INTRAMUSCULAR | Status: AC
  Filled 2015-02-06: qty 2

## 2015-02-06 MED ORDER — MUPIROCIN 2 % EX OINT
1.0000 "application " | TOPICAL_OINTMENT | Freq: Once | CUTANEOUS | Status: AC
  Administered 2015-02-06: 1 via TOPICAL
  Filled 2015-02-06: qty 22

## 2015-02-06 MED ORDER — BUPIVACAINE HCL (PF) 0.25 % IJ SOLN
INTRAMUSCULAR | Status: AC
  Filled 2015-02-06: qty 30

## 2015-02-06 MED ORDER — MUPIROCIN 2 % EX OINT
TOPICAL_OINTMENT | CUTANEOUS | Status: AC
  Filled 2015-02-06: qty 22

## 2015-02-06 SURGICAL SUPPLY — 7 items
CATH BLAZER 7FR 4MM LG 5031TK2 (ABLATOR) ×1 IMPLANT
INTRODUCER SWARTZ SRO 8F (SHEATH) ×1 IMPLANT
PACK EP LATEX FREE (CUSTOM PROCEDURE TRAY) ×2
PACK EP LF (CUSTOM PROCEDURE TRAY) IMPLANT
PAD DEFIB LIFELINK (PAD) ×1 IMPLANT
SHEATH CLASSIC 8F (SHEATH) ×1 IMPLANT
SHIELD RADPAD SCOOP 12X17 (MISCELLANEOUS) ×1 IMPLANT

## 2015-02-06 NOTE — H&P (Signed)
Patient Care Team: Marin Olp, MD as PCP - General (Family Medicine) Neldon Mc, MD (General Surgery) Charlotte Crumb, MD as Consulting Physician (Orthopedic Surgery)   HPI  Sandra [name] is a 82 y.o. female  Admitted today for AV nodal albation  For   r congestive heart failure in the setting of hypertrophic cardiomyopathy with depressed left ventricular function and now permanent atrial fibrillation   She is now dobutamine dependent She is status post CRT-D implantation and we have been unable to control her rates  .     Most recent metabolic profile date BUN Cr  8/16  1.87  11/16  1.67     We had in the past discussed amiodarone versus AV junction ablation. She had been reluctant to consider amiodarone because of the side effects. We previously  decided NOT to proceed with AV Ablation as the pts device is on RECALL for premature battery depletion and did not want to render her device dependent   She continues to deteriorate however and is now referred back for AV ablation.  !00% AF and only 12% vPACING Class 3b/4a CHF despite dobutamine  Interval echo 10/15 demonstrated RV dysfunction and biatrial enlargement (LAE-2.54) with an LVEF 20-25%       Past Medical History  Diagnosis Date  . Atrial fibrillation ([place]) permanent     on Tikosyn and Coumadin  . Hyperlipidemia   . Hypothyroidism   . Bradycardia   . Cardiomyopathy, hypertrophic nonobstructive (HCC)     ejection fraction 25%  . Anal fissure   . Adenomatous colon polyp   . Diverticulosis   . ICD-CRT     generator change 2013  . Systolic heart failure   . History of colonoscopy   . Migraines   . Hyperlipidemia   . Hypertension   . Chronic renal insufficiency, stage III (moderate)     gd 3-4  . RBBB plus LA hemiblock     Past Surgical History  Procedure Laterality Date  . Cardioversion    . Cardiac defibrillator placement  2007  . Vaginal hysterectomy      uterus  alone  . Ovarian cyst removal  1989    benign  . Biv icd genertaor change out N/A 03/23/2011    Procedure: BIV ICD GENERTAOR CHANGE OUT;  Surgeon: Deboraha Sprang, MD;  Location: [facility] CATH LAB;  Service: Cardiovascular;  Laterality: N/A;  . Colonoscopy    . Cardiac catheterization N/A 10/29/2014    Procedure: Right Heart Cath;  Surgeon: Jolaine Artist, MD;  Location: [place] [facility] CV LAB;  Service: Cardiovascular;  Laterality: N/A;  . Cardioversion N/A 11/09/2014    Procedure: CARDIOVERSION;  Surgeon: Jolaine Artist, MD;  Location: [facility] ENDOSCOPY;  Service: Cardiovascular;  Laterality: N/A;    Current Facility-Administered Medications  Medication Dose Route Frequency Provider Last Rate Last Dose  . 0.9 %  sodium chloride infusion   Intravenous Continuous Patsey Berthold, NP 50 mL/hr at 02/06/15 1303    . chlorhexidine (HIBICLENS) 4 % liquid 4 application  60 mL Topical Once Amber Sena Slate, NP      . gentamicin (GARAMYCIN) 80 mg in sodium chloride irrigation 0.9 % 500 mL irrigation  80 mg Irrigation On Call [employer], NP      . mupirocin ointment (BACTROBAN) 2 %           . vancomycin (VANCOCIN) IVPB 1000 mg/200 mL premix  1,000 mg Intravenous On Call [employer]  Sena Slate, NP        Allergies  Allergen Reactions  . Levofloxacin Other (See Comments)    Due to cardiac  AF  . Procaine Hcl Other (See Comments)    REACTION: smothers  . Spironolactone Diarrhea    Abdominal pain  . Amiodarone Nausea And Vomiting  . Tape Other (See Comments)    Tears skin off.  Please use "paper" tape only  . Amoxicillin Other (See Comments)    Rash   . Codeine Other (See Comments)    REACTION: nausea  . Niacin Other (See Comments)    REACTION: rash  . Ramipril Other (See Comments)    unknown  . Statins Other (See Comments)    REACTION: muscle aches     Medication List    ASK your doctor about these medications        clorazepate 7.5 MG tablet  Commonly known as:  TRANXENE  Take 1 tablet (7.5  mg total) by mouth 2 (two) times daily as needed for anxiety.     DOBUTamine 4-5 MG/ML-% infusion  Commonly known as:  DOBUTREX  Inject 330 mcg/min into the vein continuous.     ezetimibe 10 MG tablet  Commonly known as:  ZETIA  Take 10 mg by mouth every Monday, Wednesday, and Friday.     fluticasone 50 MCG/ACT nasal spray  Commonly known as:  FLONASE  Place 2 sprays into both nostrils daily.     potassium chloride 10 MEQ tablet  Commonly known as:  K-DUR  Take 1 tablet (10 mEq total) by mouth daily.     SYNTHROID 100 MCG tablet  Generic drug:  levothyroxine  TAKE ONE TABLET BY MOUTH ONCE DAILY BEFORE  BREAKFAST     torsemide 20 MG tablet  Commonly known as:  DEMADEX  Take 40 mg in AM and 20 mg in PM     triamcinolone cream 0.1 %  Commonly known as:  KENALOG  Apply 1 application topically 2 (two) times daily. 2 weeks max. For rash on neck and when eczema flares.     VITAMIN B COMPLEX PO  Take 1 tablet by mouth daily.     VITAMIN E PO  Take 1 tablet by mouth daily.     warfarin 5 MG tablet  Commonly known as:  COUMADIN  Take 5 mg Mon Wed and Fri. Take 2.5 mg on all other days.          Review of Systems negative except from HPI and PMH  Physical Exam BP 128/91 mmHg  Pulse 105  Temp(Src) 98.2 F (36.8 C) (Oral)  Resp 16  Ht 5\' 3"  (1.6 m)  Wt 145 lb 6 oz (65.942 kg)  BMI 25.76 kg/m2  SpO2 96% Well developed and well nourished in no acute distress HENT normal E scleral and icterus clear Neck Supple JVP>10 carotids brisk and full CRACLES b irregularly irregular AND RAPID rate and rhythm, no murmurs gallops or rub Soft with active bowel sounds No clubbing cyanosis 1+ Edema Alert and oriented, grossly normal motor and sensory function Skin Warm and Dry  ecG  afIB RBBB LAD  QRS 168  Assessment and  Plan  RBBB/LAFB  aAFIB  RVR  CHF CLASS 3B/4A  HCM  CRT-D St Jude    She is functionaly worsening and i agree that AV ablation is the next right  step.  i am of a different mind than before related to her device on recall.  Reviewing today with  St Jude the failure rates are 1/20000 and "almost all"  these in the first 4 years of service.  She is now at 46 months   This would be her 4( or 5- i vaguely remember a tack down procedure)  Which would be assoc with an anticpated risk of infection 4-12% which could be modified by an antimicrobial pouch, but the risk is still much higher than the failure rate. Her longevity is estimated at 3-4 yrs and right now her prognosis is much less than that Hence having reviewed the above with her family i have recommended and they have agreed to proceed with AV ablation alone Risks reviewed  Benefits anticipated both with rate control and perhaps also with resynchronization for her RBBB given its breadth

## 2015-02-07 ENCOUNTER — Encounter (HOSPITAL_COMMUNITY): Payer: Self-pay | Admitting: Internal Medicine

## 2015-02-07 DIAGNOSIS — I481 Persistent atrial fibrillation: Secondary | ICD-10-CM | POA: Diagnosis not present

## 2015-02-07 DIAGNOSIS — I421 Obstructive hypertrophic cardiomyopathy: Secondary | ICD-10-CM | POA: Diagnosis not present

## 2015-02-07 DIAGNOSIS — I482 Chronic atrial fibrillation: Secondary | ICD-10-CM | POA: Diagnosis not present

## 2015-02-07 DIAGNOSIS — I5023 Acute on chronic systolic (congestive) heart failure: Secondary | ICD-10-CM | POA: Diagnosis not present

## 2015-02-07 DIAGNOSIS — I130 Hypertensive heart and chronic kidney disease with heart failure and stage 1 through stage 4 chronic kidney disease, or unspecified chronic kidney disease: Secondary | ICD-10-CM | POA: Diagnosis not present

## 2015-02-07 MED ORDER — HEPARIN SOD (PORK) LOCK FLUSH 100 UNIT/ML IV SOLN
250.0000 [IU] | INTRAVENOUS | Status: AC | PRN
  Administered 2015-02-07: 11:00:00 250 [IU]

## 2015-02-07 MED ORDER — OFF THE BEAT BOOK
Freq: Once | Status: DC
  Filled 2015-02-07: qty 1

## 2015-02-07 MED FILL — Heparin Sodium (Porcine) 2 Unit/ML in Sodium Chloride 0.9%: INTRAMUSCULAR | Qty: 500 | Status: AC

## 2015-02-07 NOTE — Discharge Summary (Signed)
ELECTROPHYSIOLOGY PROCEDURE DISCHARGE SUMMARY    Patient ID: Sandra [name],  MRN: [id number], DOB/AGE: 05-13-1932 82 y.o.  Admit date: 02/06/2015 Discharge date: 02/07/2015  Primary Care Physician: Garret Reddish, MD Primary Cardiologist: [place] Electrophysiologist: Caryl Comes  Primary Discharge Diagnosis:  Active Problems:   Hypertrophic obstructive cardiomyopathy ([place])   Acute on chronic systolic congestive heart failure ([place])   Persistent atrial fibrillation (HCC)   CHF (congestive heart failure), NYHA class III ([place])   Allergies  Allergen Reactions  . Levofloxacin Other (See Comments)    Due to cardiac  AF  . Procaine Hcl Other (See Comments)    REACTION: smothers  . Spironolactone Diarrhea    Abdominal pain  . Amiodarone Nausea And Vomiting  . Tape Other (See Comments)    Tears skin off.  Please use "paper" tape only  . Amoxicillin Other (See Comments)    Rash   . Codeine Other (See Comments)    REACTION: nausea  . Niacin Other (See Comments)    REACTION: rash  . Ramipril Other (See Comments)    unknown  . Statins Other (See Comments)    REACTION: muscle aches    Procedures This Admission:  1.  AVN ablation on 02/06/15 by Dr Caryl Comes.  The patient underwent EPS and AVN node ablation with no immediate complications noted.   Brief HPI:  Sandra [name] is a 82 y.o. female with a past medical history significant for HCM, persistent atrial fibrillation (previously failed Tikosyn, amiodarone), and chronic systolic heart failure. Her heart failure has worsened with more persistent atrial fibrillation and loss of CRT pacing. She failed Tikosyn and could not tolerate amiodarone.  Risks, benefits to AVN ablation were reviewed with the patient who wished to proceed  Hospital Course:  The patient was admitted and underwent successful AVN ablation on 02/06/15.  Her groin was without complication the day of discharge. Device was programmed at 90bpm for now. Clinic visit  in 2 weeks to decrease rate and turn on rate response. She was seen and examined by Dr Caryl Comes and considered stable for discharge to home.   Physical Exam: Filed Vitals:   02/06/15 2004 02/06/15 2120 02/07/15 0300 02/07/15 0458  BP: 111/91 107/84 117/81 107/78  Pulse: 91 90  90  Temp: 97.8 F (36.6 C)   98 F (36.7 C)  TempSrc: Oral   Oral  Resp: 25 21 18 20   Height:      Weight:    145 lb 11.6 oz (66.1 kg)  SpO2: 93% 93% 95% 94%    Labs:   Lab Results  Component Value Date   WBC 5.3 02/05/2015   HGB 11.1* 02/05/2015   HCT 35.6* 02/05/2015   MCV 84.0 02/05/2015   PLT 203 02/05/2015     Recent Labs Lab 02/05/15 1128  NA 140  K 3.5  CL 101  CO2 24  BUN 36*  CREATININE 1.94*  CALCIUM 9.5  GLUCOSE 117*     Discharge Medications:  Current Discharge Medication List    CONTINUE these medications which have NOT CHANGED   Details  B Complex Vitamins (VITAMIN B COMPLEX PO) Take 1 tablet by mouth daily.    clorazepate (TRANXENE) 7.5 MG tablet Take 1 tablet (7.5 mg total) by mouth 2 (two) times daily as needed for anxiety. Qty: 60 tablet, Refills: 2    DOBUTamine (DOBUTREX) 4-5 MG/ML-% infusion Inject 330 mcg/min into the vein continuous. Qty: 250 mL, Refills: 6    ezetimibe (ZETIA) 10  MG tablet Take 10 mg by mouth every Monday, Wednesday, and Friday.     fluticasone (FLONASE) 50 MCG/ACT nasal spray Place 2 sprays into both nostrils daily. Qty: 16 g, Refills: 6    potassium chloride (K-DUR) 10 MEQ tablet Take 1 tablet (10 mEq total) by mouth daily. Qty: 90 tablet, Refills: 3    SYNTHROID 100 MCG tablet TAKE ONE TABLET BY MOUTH ONCE DAILY BEFORE  BREAKFAST Qty: 90 tablet, Refills: 3    torsemide (DEMADEX) 20 MG tablet Take 40 mg in AM and 20 mg in PM Qty: 90 tablet, Refills: 6    triamcinolone cream (KENALOG) 0.1 % Apply 1 application topically 2 (two) times daily. 2 weeks max. For rash on neck and when eczema flares. Qty: 80 g, Refills: 1    VITAMIN E PO  Take 1 tablet by mouth daily.    warfarin (COUMADIN) 5 MG tablet Take 5 mg Mon Wed and Fri. Take 2.5 mg on all other days. Qty: 90 tablet, Refills: 1        Disposition: Pt is being discharged home today in good condition. Discharge Instructions    Diet - low sodium heart healthy    Complete by:  As directed      Discharge instructions    Complete by:  As directed   No lifting over 5 lbs for 1 week. No sexual activity for 1 week.  Keep procedure site clean & dry. If you notice increased pain, swelling, bleeding or pus, call/return!  You may shower, but no soaking baths/hot tubs/pools for 1 week.     Increase activity slowly    Complete by:  As directed           Follow-up Information    Follow up with [facility] On 02/21/2015.   Specialty:  Cardiology   Why:  at [facility] for defib check (adjust rate on device)   Contact information:   [address] [place] [phone]      Follow up with Garret Reddish, MD In 1 week.   Specialty:  Family Medicine   Why:  for Coumadin check    Contact information:   [place] [id number] [phone]       Duration of Discharge Encounter: Greater than 30 minutes including physician time.  Signed, Chanetta Marshall, NP 02/07/2015 8:13 AM

## 2015-02-07 NOTE — Progress Notes (Signed)
[name], primary RN notified IV Team regarding restarting the CADD pump.   I notified Carolynn Sayers, RN with [facility] and gave her the information.   Pam is going to call [name] and work out getting the patient back on their CADD pump.

## 2015-02-08 ENCOUNTER — Ambulatory Visit (INDEPENDENT_AMBULATORY_CARE_PROVIDER_SITE_OTHER): Payer: Medicare Other | Admitting: General Practice

## 2015-02-08 ENCOUNTER — Telehealth: Payer: Self-pay | Admitting: *Deleted

## 2015-02-08 DIAGNOSIS — Z5181 Encounter for therapeutic drug level monitoring: Secondary | ICD-10-CM

## 2015-02-08 LAB — POCT INR: INR: 3.4

## 2015-02-08 NOTE — Progress Notes (Signed)
I have reviewed and agree with the plan. 

## 2015-02-08 NOTE — Progress Notes (Signed)
Pre visit review using our clinic review tool, if applicable. No additional management support is needed unless otherwise documented below in the visit note. 

## 2015-02-08 NOTE — Telephone Encounter (Signed)
Spoke with Dr Yong Channel and patient should follow up with cardiology and continue to have her PT/INR evaluated.  Transitional care mangement

## 2015-02-14 ENCOUNTER — Ambulatory Visit (INDEPENDENT_AMBULATORY_CARE_PROVIDER_SITE_OTHER): Payer: Medicare Other | Admitting: General Practice

## 2015-02-14 DIAGNOSIS — Z5181 Encounter for therapeutic drug level monitoring: Secondary | ICD-10-CM

## 2015-02-14 LAB — POCT INR: INR: 2.5

## 2015-02-14 NOTE — Progress Notes (Signed)
Pre visit review using our clinic review tool, if applicable. No additional management support is needed unless otherwise documented below in the visit note. 

## 2015-02-14 NOTE — Progress Notes (Signed)
I have reviewed and agree with the plan. 

## 2015-02-15 ENCOUNTER — Ambulatory Visit (HOSPITAL_COMMUNITY): Admission: RE | Admit: 2015-02-15 | Payer: Medicare Other | Source: Ambulatory Visit | Admitting: Internal Medicine

## 2015-02-15 ENCOUNTER — Other Ambulatory Visit (HOSPITAL_COMMUNITY): Payer: Self-pay | Admitting: Internal Medicine

## 2015-02-15 ENCOUNTER — Encounter (HOSPITAL_COMMUNITY): Admission: RE | Payer: Self-pay | Source: Ambulatory Visit

## 2015-02-15 SURGERY — AV NODE ABLATION

## 2015-02-18 ENCOUNTER — Telehealth (HOSPITAL_COMMUNITY): Payer: Self-pay | Admitting: Student

## 2015-02-18 NOTE — Telephone Encounter (Signed)
K 5.4 on labs 02/15/15.  Spoke to patient who states she stopped potassium for 1-2 days prior to labs as it was making her feel nauseated.  Told her not to go back on it, and avoid potassium rich foods such as bananas, spinach, and avacados.   Will recheck BMET later this week.   Legrand Como [address]" [place], PA-C 02/18/2015 4:06 PM

## 2015-02-20 ENCOUNTER — Ambulatory Visit (INDEPENDENT_AMBULATORY_CARE_PROVIDER_SITE_OTHER): Payer: Medicare Other | Admitting: General Practice

## 2015-02-20 DIAGNOSIS — Z5181 Encounter for therapeutic drug level monitoring: Secondary | ICD-10-CM

## 2015-02-20 LAB — POCT INR: INR: 2.3

## 2015-02-20 NOTE — Progress Notes (Signed)
Pre visit review using our clinic review tool, if applicable. No additional management support is needed unless otherwise documented below in the visit note. 

## 2015-02-20 NOTE — Progress Notes (Signed)
I have reviewed and agree with the plan. 

## 2015-02-22 ENCOUNTER — Ambulatory Visit (INDEPENDENT_AMBULATORY_CARE_PROVIDER_SITE_OTHER): Payer: Medicare Other | Admitting: *Deleted

## 2015-02-22 ENCOUNTER — Other Ambulatory Visit (HOSPITAL_COMMUNITY): Payer: Self-pay | Admitting: Internal Medicine

## 2015-02-22 ENCOUNTER — Encounter: Payer: Self-pay | Admitting: Internal Medicine

## 2015-02-22 DIAGNOSIS — I4819 Other persistent atrial fibrillation: Secondary | ICD-10-CM

## 2015-02-22 DIAGNOSIS — I481 Persistent atrial fibrillation: Secondary | ICD-10-CM

## 2015-02-22 DIAGNOSIS — I5022 Chronic systolic (congestive) heart failure: Secondary | ICD-10-CM

## 2015-02-22 DIAGNOSIS — I421 Obstructive hypertrophic cardiomyopathy: Secondary | ICD-10-CM | POA: Diagnosis not present

## 2015-02-22 LAB — CUP PACEART INCLINIC DEVICE CHECK
Battery Remaining Longevity: 32.4
Brady Statistic RA Percent Paced: 0 %
Brady Statistic RV Percent Paced: 99 %
HighPow Impedance: 34.5 Ohm
Implantable Lead Implant Date: 20071101
Implantable Lead Implant Date: 20071101
Implantable Lead Implant Date: 20071101
Implantable Lead Location: 753858
Implantable Lead Location: 753859
Implantable Lead Serial Number: 166326
Lead Channel Pacing Threshold Amplitude: 0.75 V
Lead Channel Pacing Threshold Pulse Width: 0.5 ms
Lead Channel Pacing Threshold Pulse Width: 0.5 ms
Lead Channel Setting Pacing Amplitude: 2.5 V
Lead Channel Setting Pacing Pulse Width: 0.5 ms
Lead Channel Setting Pacing Pulse Width: 0.5 ms
MDC IDC LEAD LOCATION: 753860
MDC IDC MSMT LEADCHNL LV IMPEDANCE VALUE: 412.5 Ohm
MDC IDC MSMT LEADCHNL LV PACING THRESHOLD AMPLITUDE: 1 V
MDC IDC MSMT LEADCHNL LV PACING THRESHOLD AMPLITUDE: 1 V
MDC IDC MSMT LEADCHNL LV PACING THRESHOLD PULSEWIDTH: 0.5 ms
MDC IDC MSMT LEADCHNL LV PACING THRESHOLD PULSEWIDTH: 0.5 ms
MDC IDC MSMT LEADCHNL RA IMPEDANCE VALUE: 375 Ohm
MDC IDC MSMT LEADCHNL RA SENSING INTR AMPL: 0.5 mV
MDC IDC MSMT LEADCHNL RV IMPEDANCE VALUE: 350 Ohm
MDC IDC MSMT LEADCHNL RV PACING THRESHOLD AMPLITUDE: 0.75 V
MDC IDC PG SERIAL: 7032457
MDC IDC SESS DTM: 20170210172011
MDC IDC SET LEADCHNL LV PACING AMPLITUDE: 2 V
MDC IDC SET LEADCHNL RV SENSING SENSITIVITY: 0.5 mV

## 2015-02-22 NOTE — Progress Notes (Signed)
CRT-D device check in office s/p AVN ablation. Thresholds and sensing consistent with previous device measurements. Lead impedance trends stable over time. Permanent AF+ warfarin. No ventricular arrhythmia episodes recorded. Patient bi-ventricularly pacing >99% of the time. Device programmed with appropriate safety margins. Heart failure diagnostics reviewed and trends have been unstable for patient---SK aware; pt to f/u w/ CHF clinic on 2/13. Vibratory alerts demonstrated for patient. Rate response turned on this ov, Base rate decreased to 80bpm. Estimated longevity 2.7 years.  Patient will follow up with the [facility] in 2 weeks. Patient states that she is aware of shock plan.

## 2015-02-25 ENCOUNTER — Ambulatory Visit (HOSPITAL_COMMUNITY)
Admission: RE | Admit: 2015-02-25 | Discharge: 2015-02-25 | Disposition: A | Discharge status: Home / Self Care | Payer: Medicare Other | Source: Ambulatory Visit | Attending: Internal Medicine | Admitting: Internal Medicine

## 2015-02-25 ENCOUNTER — Encounter: Payer: Self-pay | Admitting: Internal Medicine

## 2015-02-25 ENCOUNTER — Telehealth: Payer: Self-pay | Admitting: Internal Medicine

## 2015-02-25 ENCOUNTER — Encounter: Payer: Self-pay | Admitting: Licensed Clinical Social Worker

## 2015-02-25 ENCOUNTER — Encounter (HOSPITAL_COMMUNITY): Payer: Self-pay | Admitting: Internal Medicine

## 2015-02-25 VITALS — BP 126/74 | HR 78 | Wt 146.2 lb

## 2015-02-25 DIAGNOSIS — E039 Hypothyroidism, unspecified: Secondary | ICD-10-CM | POA: Insufficient documentation

## 2015-02-25 DIAGNOSIS — I4819 Other persistent atrial fibrillation: Secondary | ICD-10-CM

## 2015-02-25 DIAGNOSIS — N183 Chronic kidney disease, stage 3 unspecified: Secondary | ICD-10-CM

## 2015-02-25 DIAGNOSIS — N184 Chronic kidney disease, stage 4 (severe): Secondary | ICD-10-CM | POA: Diagnosis not present

## 2015-02-25 DIAGNOSIS — I482 Chronic atrial fibrillation: Secondary | ICD-10-CM | POA: Insufficient documentation

## 2015-02-25 DIAGNOSIS — Z7901 Long term (current) use of anticoagulants: Secondary | ICD-10-CM | POA: Insufficient documentation

## 2015-02-25 DIAGNOSIS — I129 Hypertensive chronic kidney disease with stage 1 through stage 4 chronic kidney disease, or unspecified chronic kidney disease: Secondary | ICD-10-CM | POA: Diagnosis not present

## 2015-02-25 DIAGNOSIS — I481 Persistent atrial fibrillation: Secondary | ICD-10-CM | POA: Diagnosis not present

## 2015-02-25 DIAGNOSIS — I5022 Chronic systolic (congestive) heart failure: Secondary | ICD-10-CM | POA: Diagnosis not present

## 2015-02-25 DIAGNOSIS — Z79899 Other long term (current) drug therapy: Secondary | ICD-10-CM | POA: Diagnosis not present

## 2015-02-25 DIAGNOSIS — I480 Paroxysmal atrial fibrillation: Secondary | ICD-10-CM | POA: Diagnosis not present

## 2015-02-25 DIAGNOSIS — I5023 Acute on chronic systolic (congestive) heart failure: Secondary | ICD-10-CM | POA: Diagnosis not present

## 2015-02-25 DIAGNOSIS — Z87891 Personal history of nicotine dependence: Secondary | ICD-10-CM | POA: Insufficient documentation

## 2015-02-25 LAB — BASIC METABOLIC PANEL
ANION GAP: 17 — AB (ref 5–15)
BUN: 27 mg/dL — ABNORMAL HIGH (ref 6–20)
CO2: 22 mmol/L (ref 22–32)
Calcium: 9.4 mg/dL (ref 8.9–10.3)
Chloride: 99 mmol/L — ABNORMAL LOW (ref 101–111)
Creatinine, Ser: 1.96 mg/dL — ABNORMAL HIGH (ref 0.44–1.00)
GFR calc Af Amer: 26 mL/min — ABNORMAL LOW (ref >60)
GFR, EST NON AFRICAN AMERICAN: 23 mL/min — AB (ref >60)
GLUCOSE: 118 mg/dL — AB (ref 65–99)
POTASSIUM: 3.4 mmol/L — AB (ref 3.5–5.1)
Sodium: 138 mmol/L (ref 135–145)

## 2015-02-25 LAB — BRAIN NATRIURETIC PEPTIDE: B NATRIURETIC PEPTIDE 5: 1364.1 pg/mL — AB (ref 0.0–100.0)

## 2015-02-25 MED ORDER — TORSEMIDE 20 MG PO TABS: 40.0000 mg | ORAL_TABLET | Freq: Two times a day (BID) | ORAL | Status: DC

## 2015-02-25 MED ORDER — METOLAZONE 2.5 MG PO TABS: 2.5000 mg | ORAL_TABLET | ORAL | Status: DC

## 2015-02-25 MED ORDER — POTASSIUM CHLORIDE CRYS ER 20 MEQ PO TBCR: 20.0000 meq | EXTENDED_RELEASE_TABLET | ORAL | Status: DC

## 2015-02-25 NOTE — Telephone Encounter (Signed)
New Message  Pt called to discuss if the pt will need the device check on 03/07/2015 being that she recently had it checked today. Please call back to discuss

## 2015-02-25 NOTE — Telephone Encounter (Signed)
Spoke w/ pt and informed her that I had spoke w/ NP and she stated that pt needed to keep appt. Pt verbalized understanding.

## 2015-02-25 NOTE — Progress Notes (Signed)
Patient ID: Sandra [name], female   DOB: Jul 12, 1932, 82 y.o.   MRN: [id number]    Advanced Heart Failure Clinic Note   Primary Care: Primary EP: Dr. Caryl Comes Primary HF: Dr. Haroldine Laws   HPI:  Sandra [name] is a 82 y.o. female with h/o PAF, hypothyroidism, St Jude CRT-D CKD stage 4 and chronic systolic HF due to NICM with EF 25%. Tikosyn previously held with worsening renal function.   Admitted 10/29/14 after [place] with cardiogenic shock. Functional decline correlated with recurrence of A fib.  Placed on milrinone and diuresed with IV lasix. Due to persistent low output she was switched from milrinone to dobutamine with improved hemodynamics. Dobutamine was unable to be weaned.  Continued on dobutamine 5 mcg via [facility].  No ace or bb with low output/CKD.  EP consulted for AVN ablation; Deferred in favor of Tikosyn and repeat DC-CV. Tikosyn was reloaded and cardioversion initially successful, but reverted to A fib with controlled rate prior to d/c. Tikosyn stopped. INR  followed at [facility]. Overall she diuresed 11 pounds with IV lasix and was transitioned to 40 mg daily torsemide. Discharge weight 140  Underwent AVN ablation on 02/06/15.  She presents today for regular follow up. Feels terrible. Very weak. Very little energy. Can barely walk across the room. Feels that her fluid is up.  + ankle edema. Weight at home relatively stable at 146. + orthopnea. On torsemide 40/20. Has not taken extra.  Remains on dobutamine 4 mcg/kg/min   [place]: Chronic AF 100% time. Baseline rate 80s. [place] way down with marked fluid overload   RHC 10/29/2014  RA = 19 RV = 70/9/19 PA = 69/31 (45) PCW = 27 Fick cardiac output/index = 2.3/1.4 Thermo cardiac output/index = 2.3/1.4 PVR = 7.8 WU Ao sat = 94% PA sat = 35%, 38%  Labs 11/25/14 K 4.8, Cr 1.7  Past Medical History  Diagnosis Date  . Atrial fibrillation ([place]) permanent     on Tikosyn and Coumadin  . Hyperlipidemia   . Hypothyroidism   .  Bradycardia   . Cardiomyopathy, hypertrophic nonobstructive (HCC)     ejection fraction 25%  . Anal fissure   . Adenomatous colon polyp   . Diverticulosis   . ICD-CRT     generator change 2013  . Systolic heart failure   . Hyperlipidemia   . Hypertension   . RBBB plus LA hemiblock   . Family history of adverse reaction to anesthesia     "middle daughter couldn't take Versed"  . CHF (congestive heart failure) ([place])   . Varicose veins   . Chronic bronchitis ([place])     "get it most q spring and fall" (02/06/2015)  . Migraines     "stopped after I went thru the change"  . History of gout   . Situational anxiety   . Chronic renal insufficiency, stage III (moderate)     gd 3-4  . Basal cell carcinoma     "right foot; right leg" (02/06/2015)    Current Outpatient Prescriptions  Medication Sig Dispense Refill  . B Complex Vitamins (VITAMIN B COMPLEX PO) Take 1 tablet by mouth daily.    . clorazepate (TRANXENE) 7.5 MG tablet Take 1 tablet (7.5 mg total) by mouth 2 (two) times daily as needed for anxiety. 60 tablet 2  . DOBUTamine (DOBUTREX) 4-5 MG/ML-% infusion Inject 330 mcg/min into the vein continuous. 250 mL 6  . ezetimibe (ZETIA) 10 MG tablet Take 10 mg by mouth every Monday,  Wednesday, and Friday.     . fluticasone (FLONASE) 50 MCG/ACT nasal spray Place 2 sprays into both nostrils daily. 16 g 6  . SYNTHROID 100 MCG tablet TAKE ONE TABLET BY MOUTH ONCE DAILY BEFORE  BREAKFAST (Patient taking differently: TAKE ONE TABLET BY MOUTH ONCE DAILY AT BEDTIME) 90 tablet 3  . torsemide (DEMADEX) 20 MG tablet Take 40 mg in AM and 20 mg in PM 90 tablet 6  . triamcinolone cream (KENALOG) 0.1 % Apply 1 application topically 2 (two) times daily. 2 weeks max. For rash on neck and when eczema flares. 80 g 1  . VITAMIN E PO Take 1 tablet by mouth daily.    Marland Kitchen warfarin (COUMADIN) 5 MG tablet Take 5 mg Mon Wed and Fri. Take 2.5 mg on all other days. (Patient taking differently: Take 2.5-5 mg by mouth  daily at 6 PM. Take 5 mg Mon. Take 2.5 mg on all other days.) 90 tablet 1   No current facility-administered medications for this encounter.    Allergies  Allergen Reactions  . Levofloxacin Other (See Comments)    Due to cardiac  AF  . Procaine Hcl Other (See Comments)    REACTION: smothers  . Spironolactone Diarrhea    Abdominal pain  . Amiodarone Nausea And Vomiting  . Tape Other (See Comments)    Tears skin off.  Please use "paper" tape only  . Amoxicillin Other (See Comments)    Rash   . Codeine Other (See Comments)    REACTION: nausea  . Niacin Other (See Comments)    REACTION: rash  . Ramipril Other (See Comments)    unknown  . Statins Other (See Comments)    REACTION: muscle aches      Social History   Social History  . Marital Status: Married    Spouse Name: N/A  . Number of Children: 3  . Years of Education: N/A   Occupational History  . Retired     Probation officer   Social History Main Topics  . Smoking status: Former Smoker -- 0.20 packs/day for 19 years    Types: Cigarettes    Quit date: 01/13/1987  . Smokeless tobacco: Never Used  . Alcohol Use: No  . Drug Use: No  . Sexual Activity: No   Other Topics Concern  . Not on file   Social History Narrative   Married 1953 (husband Clare Gandy in our [employer]). 3 girls (1 is a patient here-[name]). 2 grandkids.       Retired in [place]: very active, going to church and church friends, movies      Family History  Problem Relation Age of Onset  . Heart disease Mother   . Colon cancer Father   . COPD Father   . Heart disease Father   . Cardiomyopathy Daughter   . Breast cancer Paternal Aunt   . Colon polyps Father   . Diabetes Neg Hx   . Kidney disease Neg Hx     Filed Vitals:   02/25/15 1022  BP: 126/74  Pulse: 78  Weight: 146 lb 4 oz (66.339 kg)  SpO2: 98%      PHYSICAL EXAM: General:  Fatigued appearing. NAD HEENT: normal Neck: supple. JVP to jaw . Carotids  2+ bilat; no bruits. No thyromegaly or nodule noted. Cor: PMI laterally displaced. regular Lungs: CTA, normal effort. Abdomen: soft, nontender, +distended, no HSM. No bruits or masses. +BS Extremities: no cyanosis, clubbing, rash,  2+ edema Neuro: alert & oriented x 3, cranial nerves grossly intact. moves all 4 extremities w/o difficulty. Affect pleasant. Skin: no rash.  ASSESSMENT & PLAN:  1. Acute on chronic systolic HF due to NICM, Echo 10/2014 EF 20-25% s/p St Jude CRT-D  - She is markedly volume overloaded with NYHA IV symptoms. Increase torsemide 40 to bid and give metolazone 2.5 daily for 2 days. (and as needed over the weekend). Take extra potassium with the metolazone.  - Will take back-up rate down to 80 - ICD interrogated personally  - I talked to Chanetta Marshall NP from EP and she will optimize BiV in 2 weeks 2. PAF -S/P DC-CV 10/28 but later back in A fib s/p AV node ablation. Failed amio and Tikosyn.  3. CKD, stage 4 -stable. Repeat today 4. Hypothyroidism  RTC next week. Continue dobutamine for now.    Elyzabeth Goatley,MD 10:47 AM

## 2015-02-25 NOTE — Patient Instructions (Signed)
Increase Torsemide to 40 mg (2 tabs) Twice daily   Take Metolazone 2.5 mg for 2 days  Take Potassium 20 meq when you take Metolazone  Labs today  Your physician recommends that you schedule a follow-up appointment in: 1 week

## 2015-02-26 ENCOUNTER — Ambulatory Visit (INDEPENDENT_AMBULATORY_CARE_PROVIDER_SITE_OTHER): Payer: Medicare Other | Admitting: General Practice

## 2015-02-26 ENCOUNTER — Telehealth (HOSPITAL_COMMUNITY): Payer: Self-pay | Admitting: *Deleted

## 2015-02-26 DIAGNOSIS — Z5181 Encounter for therapeutic drug level monitoring: Secondary | ICD-10-CM

## 2015-02-26 LAB — POCT INR: INR: 3

## 2015-02-26 NOTE — Progress Notes (Signed)
CSW met with patient and her family members in the clinic to discuss the [facility] [place] program. CSW discussed services available through the program and benefits. Patient states she currently has Homecare services 3x weekly. She states she is overwhelmed with current services coming to the home and would prefer to hold off adding any additional services in the home. Patient will notify CSW if interested in the future. Raquel Sarna, [place]

## 2015-02-26 NOTE — Telephone Encounter (Signed)
Received phone call from [facility] with critical labs, she report K 10.0, she states labs just resulted this AM but were actually drawn on Sun 2/12.  Advised pt had labs at our office yesterday and K was 3.4, Dr Haroldine Laws aware of both lab results and recommends pt follow current plan.  Spoke w/pt she is aware and agreeable.

## 2015-02-26 NOTE — Progress Notes (Signed)
Pre visit review using our clinic review tool, if applicable. No additional management support is needed unless otherwise documented below in the visit note. 

## 2015-02-26 NOTE — Progress Notes (Signed)
I have reviewed and agree with the plan. 

## 2015-02-28 ENCOUNTER — Encounter: Payer: Self-pay | Admitting: Internal Medicine

## 2015-03-04 ENCOUNTER — Telehealth: Payer: Self-pay | Admitting: Internal Medicine

## 2015-03-04 ENCOUNTER — Ambulatory Visit (INDEPENDENT_AMBULATORY_CARE_PROVIDER_SITE_OTHER): Payer: Medicare Other | Admitting: General Practice

## 2015-03-04 DIAGNOSIS — Z5181 Encounter for therapeutic drug level monitoring: Secondary | ICD-10-CM

## 2015-03-04 LAB — POCT INR: INR: 2.3

## 2015-03-04 NOTE — Progress Notes (Addendum)
Patient ID: Sandra [name], female   DOB: [birth year], 82 y.o.   MRN: [id number]    Advanced Heart Failure Clinic Note   Primary Care: Primary EP: Dr. Caryl [name] Primary HF: Dr. Haroldine [name]   HPI:  Sandra [name] is a 82 y.o. female with h/o PAF, hypothyroidism, St Jude CRT-D CKD stage 4 and chronic systolic HF due to NICM with EF 25%. Tikosyn previously held with worsening renal function.   Admitted 10/29/14 after [place] with cardiogenic shock. Functional decline correlated with recurrence of A fib.  Placed on milrinone and diuresed with IV lasix. Due to persistent low output she was switched from milrinone to dobutamine with improved hemodynamics. Dobutamine was unable to be weaned.  Continued on dobutamine 5 mcg via [facility].  No ace or bb with low output/CKD.  EP consulted for AVN ablation; Deferred in favor of Tikosyn and repeat DC-CV. Tikosyn was reloaded and cardioversion initially successful, but reverted to A fib with controlled rate prior to d/c. Tikosyn stopped. [facility]  [facility]. Overall she diuresed 11 pounds with IV lasix and was transitioned to 40 mg daily torsemide. Discharge weight 140  Underwent AVN ablation on 02/06/15.  She presents today for follow up. Torsemide increased at last visit with volume overload.  Metolazone given x 2 days. Overall feeling much better, down 16 lbs by home scale.  No more SOB, lying flat on one pillow. No more bendopnea. Energy improving but appetite down. Remains on dobutamine 4 mcg/kg/min. Still having gout pain, has not heard back from rheumatologist referral.  Corvue: Chronic AF 100% time. Baseline rate 70. Thoracic impedence high with improved volume status.    [place] 10/29/2014  RA = 19 RV = 70/9/19 PA = 69/31 (45) PCW = 27 Fick cardiac output/index = 2.3/1.4 Thermo cardiac output/index = 2.3/1.4 PVR = 7.8 WU Ao sat = 94% PA sat = 35%, 38%  Labs 11/25/14 K 4.8, Cr 1.7  Past Medical History  Diagnosis Date  . Atrial fibrillation  ([place]) permanent     on Tikosyn and Coumadin  . Hyperlipidemia   . Hypothyroidism   . Bradycardia   . Cardiomyopathy, hypertrophic nonobstructive (HCC)     ejection fraction 25%  . Anal fissure   . Adenomatous colon polyp   . Diverticulosis   . ICD-CRT     generator change 2013  . Systolic heart failure   . Hyperlipidemia   . Hypertension   . RBBB plus LA hemiblock   . Family history of adverse reaction to anesthesia     "middle daughter couldn't take Versed"  . CHF (congestive heart failure) ([place])   . Varicose veins   . Chronic bronchitis ([place])     "get it most q spring and fall" (02/06/2015)  . Migraines     "stopped after I went thru the change"  . History of gout   . Situational anxiety   . Chronic renal insufficiency, stage III (moderate)     gd 3-4  . Basal cell carcinoma     "right foot; right leg" (02/06/2015)    Current Outpatient Prescriptions  Medication Sig Dispense Refill  . acetaminophen (TYLENOL) 325 MG tablet Take 650 mg by mouth every 6 (six) hours as needed for mild pain or headache.    . clorazepate (TRANXENE) 7.5 MG tablet Take 1 tablet (7.5 mg total) by mouth 2 (two) times daily as needed for anxiety. 60 tablet 2  . DOBUTamine (DOBUTREX) 4-5 MG/ML-% infusion Inject 330 mcg/min into the  vein continuous. 250 mL 6  . ezetimibe (ZETIA) 10 MG tablet Take 10 mg by mouth every Monday, Wednesday, and Friday.     . fluticasone (FLONASE) 50 MCG/ACT nasal spray Place 2 sprays into both nostrils daily. 16 g 6  . levothyroxine (SYNTHROID, LEVOTHROID) 100 MCG tablet Take 100 mcg by mouth at bedtime.    . torsemide (DEMADEX) 20 MG tablet Take 2 tablets (40 mg total) by mouth 2 (two) times daily. 120 tablet 6  . triamcinolone cream (KENALOG) 0.1 % Apply 1 application topically 2 (two) times daily as needed (itching/rash).    Marland Kitchen VITAMIN E PO Take 1 tablet by mouth daily.    Marland Kitchen warfarin (COUMADIN) 5 MG tablet Take 2.5-5 mg by mouth as directed. Take 2.5 mg (1/2 tablet) daily  except 5 mg (1 tablet) on Monday    . metolazone (ZAROXOLYN) 2.5 MG tablet Take 1 tablet (2.5 mg total) by mouth as directed. (Patient not taking: Reported on 03/05/2015) 5 tablet 3  . potassium chloride SA (K-DUR,KLOR-CON) 20 MEQ tablet Take 1 tablet (20 mEq total) by mouth as directed. Take when you take metolazone (Patient not taking: Reported on 03/05/2015) 90 tablet 3   No current facility-administered medications for this encounter.    Allergies  Allergen Reactions  . Levofloxacin Other (See Comments)    Due to cardiac  AF  . Procaine Hcl Other (See Comments)    REACTION: smothers  . Spironolactone Diarrhea    Abdominal pain  . Amiodarone Nausea And Vomiting  . Tape Other (See Comments)    Tears skin off.  Please use "paper" tape only  . Amoxicillin Other (See Comments)    Rash   . Codeine Other (See Comments)    REACTION: nausea  . Niacin Other (See Comments)    REACTION: rash  . Ramipril Other (See Comments)    unknown  . Statins Other (See Comments)    REACTION: muscle aches      Social History   Social History  . Marital Status: Married    Spouse Name: N/A  . Number of Children: 3  . Years of Education: N/A   Occupational History  . Retired     Probation officer   Social History Main Topics  . Smoking status: Former Smoker -- 0.20 packs/day for 19 years    Types: Cigarettes    Quit date: 01/13/1987  . Smokeless tobacco: Never Used  . Alcohol Use: No  . Drug Use: No  . Sexual Activity: No   Other Topics Concern  . Not on file   Social History Narrative   Married 1953 (husband Sandra [name] in our Network engineer). 3 girls (1 is a patient here-Sandra [name]). 2 grandkids.       Retired in [place]: very active, going to church and church friends, movies      Family History  Problem Relation Age of Onset  . Heart disease Mother   . Colon cancer Father   . COPD Father   . Heart disease Father   . Cardiomyopathy Daughter   . Breast cancer  Paternal Aunt   . Colon polyps Father   . Diabetes Neg Hx   . Kidney disease Neg Hx     Filed Vitals:   03/05/15 1110  BP: 116/70  Pulse: 79  Weight: 133 lb 9.6 oz (60.601 kg)  SpO2: 97%   Wt Readings from Last 3 Encounters:  03/05/15 133 lb 9.6 oz (60.601 kg)  02/25/15 146 lb 4 oz (66.339 kg)  02/07/15 145 lb 11.6 oz (66.1 kg)      PHYSICAL EXAM: General:  Elderly appearing. NAD HEENT: normal Neck: supple. JVP 6-7. Carotids 2+ bilat; no bruits. No thyromegaly or nodule noted. Cor: PMI laterally displaced. regular Lungs: CTA, normal effort. Abdomen: soft, NT, ND, no HSM. No bruits or masses. +BS  Extremities: no cyanosis, clubbing, rash, no edema Neuro: alert & oriented x 3, cranial nerves grossly intact. moves all 4 extremities w/o difficulty. Affect pleasant. Skin: no rash.  ASSESSMENT & PLAN:  1. Chronic systolic HF due to NICM, Echo 10/2014 EF 20-25% s/p St Jude CRT-D  - Volume status much improved on increased regimen and metolazone last week. Down 13 lbs by our scale. - Contnue torsemide 40 BID.  Says she had blood drawn yesterday, will await results from [facility].  - Continue to take 2.5 mg metolazone only as needed for weight gain of 3 lbs overnight or 5 lbs within a week with extra 20 meq of K when taking metolazone.  - EP to optimize BiV 03/07/15. 2. PAF -S/P DC-CV 10/28 but later back in A fib s/p AV node ablation. Failed amio and Tikosyn.  - Chronic afib by corvue ( AT/AF burden > 99%) 3. CKD, stage 3-4 -stable.  - Awaiting [facility] from yesterday. 4. Hypothyroidism - Per PCP 5. Gout - Waiting to hear back from Dr Beakman/Rheumatology office. We will follow up on her referral.   Will follow up closely in 2 weeks on PA/NP side. Awaiting BMET from [facility] from yesterday.   Shirley Friar, PA-C 11:44 AM

## 2015-03-04 NOTE — Telephone Encounter (Signed)
Called in   Pt 27.1    Inr 2.3

## 2015-03-04 NOTE — Progress Notes (Signed)
Pre visit review using our clinic review tool, if applicable. No additional management support is needed unless otherwise documented below in the visit note. 

## 2015-03-04 NOTE — Telephone Encounter (Signed)
Spoke with Otila Kluver, RN @ [facility] about patient's INR and gave new orders.

## 2015-03-05 ENCOUNTER — Ambulatory Visit (HOSPITAL_BASED_OUTPATIENT_CLINIC_OR_DEPARTMENT_OTHER)
Admission: RE | Admit: 2015-03-05 | Discharge: 2015-03-05 | Disposition: A | Discharge status: Home / Self Care | Payer: Medicare Other | Source: Ambulatory Visit | Attending: Cardiology | Admitting: Cardiology

## 2015-03-05 VITALS — BP 116/70 | HR 79 | Wt 133.6 lb

## 2015-03-05 DIAGNOSIS — Z79899 Other long term (current) drug therapy: Secondary | ICD-10-CM | POA: Insufficient documentation

## 2015-03-05 DIAGNOSIS — Z8249 Family history of ischemic heart disease and other diseases of the circulatory system: Secondary | ICD-10-CM

## 2015-03-05 DIAGNOSIS — Z825 Family history of asthma and other chronic lower respiratory diseases: Secondary | ICD-10-CM | POA: Insufficient documentation

## 2015-03-05 DIAGNOSIS — Z85828 Personal history of other malignant neoplasm of skin: Secondary | ICD-10-CM

## 2015-03-05 DIAGNOSIS — I428 Other cardiomyopathies: Secondary | ICD-10-CM

## 2015-03-05 DIAGNOSIS — I480 Paroxysmal atrial fibrillation: Secondary | ICD-10-CM

## 2015-03-05 DIAGNOSIS — I130 Hypertensive heart and chronic kidney disease with heart failure and stage 1 through stage 4 chronic kidney disease, or unspecified chronic kidney disease: Secondary | ICD-10-CM | POA: Insufficient documentation

## 2015-03-05 DIAGNOSIS — I482 Chronic atrial fibrillation, unspecified: Secondary | ICD-10-CM

## 2015-03-05 DIAGNOSIS — Z888 Allergy status to other drugs, medicaments and biological substances status: Secondary | ICD-10-CM

## 2015-03-05 DIAGNOSIS — Z885 Allergy status to narcotic agent status: Secondary | ICD-10-CM | POA: Insufficient documentation

## 2015-03-05 DIAGNOSIS — Z87891 Personal history of nicotine dependence: Secondary | ICD-10-CM

## 2015-03-05 DIAGNOSIS — I5020 Unspecified systolic (congestive) heart failure: Secondary | ICD-10-CM | POA: Diagnosis not present

## 2015-03-05 DIAGNOSIS — I10 Essential (primary) hypertension: Secondary | ICD-10-CM

## 2015-03-05 DIAGNOSIS — E785 Hyperlipidemia, unspecified: Secondary | ICD-10-CM | POA: Insufficient documentation

## 2015-03-05 DIAGNOSIS — N183 Chronic kidney disease, stage 3 unspecified: Secondary | ICD-10-CM

## 2015-03-05 DIAGNOSIS — E039 Hypothyroidism, unspecified: Secondary | ICD-10-CM

## 2015-03-05 DIAGNOSIS — I5022 Chronic systolic (congestive) heart failure: Secondary | ICD-10-CM | POA: Insufficient documentation

## 2015-03-05 DIAGNOSIS — Z7901 Long term (current) use of anticoagulants: Secondary | ICD-10-CM

## 2015-03-05 DIAGNOSIS — M109 Gout, unspecified: Secondary | ICD-10-CM

## 2015-03-05 DIAGNOSIS — Z881 Allergy status to other antibiotic agents status: Secondary | ICD-10-CM | POA: Insufficient documentation

## 2015-03-05 DIAGNOSIS — Z95810 Presence of automatic (implantable) cardiac defibrillator: Secondary | ICD-10-CM

## 2015-03-05 NOTE — Progress Notes (Signed)
Advanced Heart Failure Medication Review by a Pharmacist  Does the patient  feel that his/her medications are working for him/her?  yes  Has the patient been experiencing any side effects to the medications prescribed?  no  Does the patient measure his/her own blood pressure or blood glucose at home?  yes   Does the patient have any problems obtaining medications due to transportation or finances?   no  Understanding of regimen: good Understanding of indications: good Potential of compliance: good Patient understands to avoid NSAIDs. Patient understands to avoid decongestants.  Issues to address at subsequent visits: None   Pharmacist comments:  Ms. Jochimsen is a pleasant 82 yo F presenting with her daughter and [place] but without a medication list. She reports excellent compliance with her regimen and states that she took metolazone 2.5 mg daily x 2 days along with KCl 20 meq x 2 days last week but has not taken any since. She did not have any specific medication-related questions or concerns for me at this time.   Ruta Hinds. Velva Harman, PharmD, BCPS, CPP Clinical Pharmacist Pager: [phone] Phone: [phone] 03/05/2015 11:29 AM      Time with patient: 10 minutes Preparation and documentation time: 2 minutes Total time: 12 minutes

## 2015-03-05 NOTE — Patient Instructions (Signed)
Your physician recommends that you schedule a follow-up appointment in: 2 weeks In the [facility]

## 2015-03-06 ENCOUNTER — Telehealth (HOSPITAL_COMMUNITY): Payer: Self-pay | Admitting: *Deleted

## 2015-03-06 MED ORDER — POTASSIUM CHLORIDE CRYS ER 10 MEQ PO TBCR: 10.0000 meq | EXTENDED_RELEASE_TABLET | Freq: Two times a day (BID) | ORAL | Status: DC

## 2015-03-06 NOTE — Telephone Encounter (Signed)
Pt called to let us know she got up to pee at 1:30 this AM and was having pain in her right shoulder, she states she was finally able to go back to bed.  When she woke up this AM the pain has moved to her back, right below her shoulder blade around her ribs.  She also states she coughed and some bright red blood came up, she state she has coughed a few more times and there was only a spot or 2 of blood and the last time she coughed there was no blood.  She states she has checked all her VS this AM and everything was normal/stable for her.  Pt denies any pulling or issues with her PICC and states she has not fallen or hit/bumped into anything.  Discussed w/Andy Chalmers Cater, PA he feels pt just could be dry, she can take tylenol for discomfort and monitor symptoms.  He also states her labs drawn by [facility] showed her K was low at 3.2 and he would like her to take 80 meq of KCL today and then start 20 meq Twice daily.  Pt is aware and agreeable to tylenol and monitoring.  She states she has a hard time taking KCL tabs and she will take what she can.  She states she has the 10 meq tabs, she can not take the 20's and will not take liquid either.  She is agreeable to take as many as she can today and then will try taking 10 meq Twice daily, she states she will not take 20 Twice daily.  She also states she will increase K in her diet.  Will have [place] recheck labs next week.

## 2015-03-07 ENCOUNTER — Other Ambulatory Visit: Payer: Self-pay | Admitting: Family Medicine

## 2015-03-07 ENCOUNTER — Telehealth (HOSPITAL_COMMUNITY): Payer: Self-pay

## 2015-03-07 ENCOUNTER — Encounter: Payer: Self-pay | Admitting: Family Medicine

## 2015-03-07 ENCOUNTER — Ambulatory Visit (INDEPENDENT_AMBULATORY_CARE_PROVIDER_SITE_OTHER): Payer: Medicare Other | Admitting: Family Medicine

## 2015-03-07 ENCOUNTER — Telehealth: Payer: Self-pay | Admitting: Family Medicine

## 2015-03-07 ENCOUNTER — Ambulatory Visit (INDEPENDENT_AMBULATORY_CARE_PROVIDER_SITE_OTHER)
Admission: RE | Admit: 2015-03-07 | Discharge: 2015-03-07 | Disposition: A | Discharge status: Home / Self Care | Payer: Medicare Other | Source: Ambulatory Visit | Attending: Family Medicine | Admitting: Family Medicine

## 2015-03-07 VITALS — BP 98/52 | HR 78 | Temp 98.1°F | Wt 136.0 lb

## 2015-03-07 DIAGNOSIS — J690 Pneumonitis due to inhalation of food and vomit: Secondary | ICD-10-CM | POA: Diagnosis not present

## 2015-03-07 DIAGNOSIS — R042 Hemoptysis: Secondary | ICD-10-CM

## 2015-03-07 MED ORDER — CLINDAMYCIN HCL 150 MG PO CAPS: 450.0000 mg | ORAL_CAPSULE | Freq: Three times a day (TID) | ORAL | Status: DC

## 2015-03-07 NOTE — Telephone Encounter (Signed)
Patient Name: Sandra [name] DOB: 1932/10/02 Initial Comment [name] states she is a CHF patient, got choked up Sunday, has been coughing up blood since, now having chest pain. Nurse Assessment Nurse: Vallery Sa, RN, C[place] Date/Time (Eastern Time): 03/07/2015 11:30:55 AM Confirm and document reason for call. If symptomatic, describe symptoms. You must click the next button to save text entered. ---[name] states she developed chest pain yesterday. No severe breathing difficulty. Alert and responsive. Has the patient traveled out of the country within the last 30 days? ---No Does the patient have any new or worsening symptoms? ---Yes Will a triage be completed? ---Yes Related visit to physician within the last 2 weeks? ---No Does the PT have any chronic conditions? (i.e. diabetes, asthma, etc.) ---Yes List chronic conditions. ---CHF Is this a behavioral health or substance abuse call? ---No Guidelines Guideline Title Affirmed Question Affirmed Notes Chest Pain Difficulty breathing Final Disposition User Go to ED Now Vallery Sa, RN, [place] Comments [name] would not allow me to continue with the triage process or give additional information. She asks that I get the Doctor on the phone. She shares that [name] was there and wants her MD to order a chest Xray because of her chest pain and coughing up of blood. Called the office backline and notified Arbie Cookey. Arbie Cookey asks that I send the reports and they will call her back. [employer] notified. Disagree/Comply: Disagree Disagree/Comply Reason: Disagree with instructions

## 2015-03-07 NOTE — Progress Notes (Signed)
Garret Reddish, MD  Subjective:  Sandra [name] is a 82 y.o. year old very pleasant female patient who presents for/with See problem oriented charting ROS- endorses cough without sinus congestion. Has some right upper back pain. No fever/chills/nausea/vomiting. No shortness of breath.   Past Medical History-  Patient Active Problem List   Diagnosis Date Noted  . Normal coronary arteries 2004 04/04/2014    Priority: High  . Chronic systolic heart failure ([place]) 12/18/2010    Priority: High  . Biventricular implantable cardioverter-defibrillator -St. Jude 05/15/2010    Priority: High  . Hypertrophic obstructive cardiomyopathy ([place]) 02/08/2007    Priority: High  . Essential hypertension, benign 10/16/2013    Priority: Medium  . Gout 10/16/2013    Priority: Medium  . Anxiety 03/31/2010    Priority: Medium  . Chronic kidney disease, stage III (moderate) 12/30/2009    Priority: Medium  . Hypothyroidism 07/08/2006    Priority: Medium  . Hyperlipidemia 07/08/2006    Priority: Medium  . Encounter for therapeutic drug monitoring 02/13/2013    Priority: Low  . Dyspnea 12/07/2011    Priority: Low  . RIATA Lead 03/23/2011    Priority: Low  . History of colonic polyps 09/10/2008    Priority: Low  . Fibrocystic breast disease 09/17/1991    Priority: Low  . CHF (congestive heart failure), NYHA class III ([place]) 02/06/2015  . Atrial fibrillation ([place]) 01/04/2015  . Drug rash 11/29/2014  . RBBB plus LA hemiblock   . Persistent atrial fibrillation ([place])   . Acute on chronic systolic congestive heart failure ([place]) 10/29/2014  . Cardiogenic shock (HCC)     Medications- reviewed and updated Current Outpatient Prescriptions  Medication Sig Dispense Refill  . DOBUTamine (DOBUTREX) 4-5 MG/ML-% infusion Inject 330 mcg/min into the vein continuous. 250 mL 6  . ezetimibe (ZETIA) 10 MG tablet Take 10 mg by mouth every Monday, Wednesday, and Friday.     . fluticasone (FLONASE) 50 MCG/ACT nasal  spray Place 2 sprays into both nostrils daily. 16 g 6  . levothyroxine (SYNTHROID, LEVOTHROID) 100 MCG tablet Take 100 mcg by mouth at bedtime.    . potassium chloride SA (K-DUR,KLOR-CON) 10 MEQ tablet Take 1 tablet (10 mEq total) by mouth 2 (two) times daily.    Marland Kitchen torsemide (DEMADEX) 20 MG tablet Take 2 tablets (40 mg total) by mouth 2 (two) times daily. 120 tablet 6  . triamcinolone cream (KENALOG) 0.1 % Apply 1 application topically 2 (two) times daily as needed (itching/rash).    Marland Kitchen VITAMIN E PO Take 1 tablet by mouth daily.    Marland Kitchen warfarin (COUMADIN) 5 MG tablet Take 2.5-5 mg by mouth as directed. Take 2.5 mg (1/2 tablet) daily except 5 mg (1 tablet) on Monday    . acetaminophen (TYLENOL) 325 MG tablet Take 650 mg by mouth every 6 (six) hours as needed for mild pain or headache. Reported on 03/07/2015    . clindamycin (CLEOCIN) 150 MG capsule Take 3 capsules (450 mg total) by mouth 3 (three) times daily. 63 capsule 0  . clorazepate (TRANXENE) 7.5 MG tablet Take 1 tablet (7.5 mg total) by mouth 2 (two) times daily as needed for anxiety. (Patient not taking: Reported on 03/07/2015) 60 tablet 2  . metolazone (ZAROXOLYN) 2.5 MG tablet Take 1 tablet (2.5 mg total) by mouth as directed. (Patient not taking: Reported on 03/05/2015) 5 tablet 3   No current facility-administered medications for this visit.    Objective: BP 98/52 mmHg  Pulse 78  Temp(Src) 98.1 F (36.7 C)  Wt 136 lb (61.689 kg) Gen: NAD, resting comfortably in chair, appears in pain with deep breath CV: RRR  Lungs: Crackles noted in right mid to lower lung field. Otherwise CTAB no crackles, wheeze, rhonchi Abdomen: soft/nontender/nondistended/normal bowel sounds.  Ext: no edema Skin: warm, dry Neuro: grossly normal, moves all extremities  Dg Chest 2 View 03/07/2015  CLINICAL DATA:  Right-sided chest pain and hemoptysis for 2 days. EXAM: CHEST  2 VIEW COMPARISON:  Chest x-ray 02/06/2015 FINDINGS: The heart is enlarged but stable.  There is tortuosity and calcification of the thoracic aorta. The pacer wires are stable. Right perihilar density is most likely pneumonia. It was not present on the prior chest x-ray. No pleural effusion. The left lung is clear. The bony thorax is intact. IMPRESSION: Right perihilar infiltrate likely in the superior segment of the right lower lobe based on the lateral film. Followup PA and lateral chest X-ray is recommended in 3-4 weeks following trial of antibiotic therapy to ensure resolution and exclude underlying malignancy. Electronically Signed   By: Marijo Sanes M.D.   On: 03/07/2015 16:04   Assessment/Plan:  Aspiration pneumonia S: Over weekend choked on some corn and peas. Was talking while eating but usually has no issues with swallowing. She felt like she coughed all that day and developed pain in right shoulder and later in her mid to upper back or in mid to lower lung fields. Hurt worse with deep breaths but almost always just ached. Since the choking spell she has had several episodes of hemoptysis. Of note on coumadin. The first one was almost a spoonfull but subsequently has just been specks- she feels like she irritated her throat with choking episode A/P: exam, history, CXR consistent with story of aspiration pneumonia. Allergy to amoxicillin- would normally use augmentin in this case. We will use clindamycin 450 mg TID - if unable to tolerate would trial augmentin as allergy to amoxicillin is rash. No renal adjustments needed per up to date and interaction checker does not reveal contraindication to clindamycin or augmentin.   High risk patient with history HOCM, heart failure, defibrillator, CKD III prior- now CKD IV  Repeat CXR 4 weeks. Return precautions advised.   Meds ordered this encounter  Medications  . clindamycin (CLEOCIN) 150 MG capsule    Sig: Take 3 capsules (450 mg total) by mouth 3 (three) times daily.    Dispense:  63 capsule    Refill:  0

## 2015-03-07 NOTE — Telephone Encounter (Signed)
Appointment today scheduled- CXR first

## 2015-03-07 NOTE — Patient Instructions (Signed)
Take clindamycin 150 mg (3 pills 3x a day for 7 days)  Aspiration Pneumonia Aspiration pneumonia is an infection in your lungs. It occurs when food, liquid, or stomach contents (vomit) are inhaled (aspirated) into your lungs. When these things get into your lungs, swelling (inflammation) and infection can occur. This can make it difficult for you to breathe. Aspiration pneumonia is a serious condition and can be life threatening. RISK FACTORS Aspiration pneumonia is more likely to occur when a person's cough (gag) reflex or ability to swallow has been decreased. Some things that can do this include:   Having a brain injury or disease, such as stroke, seizures, Parkinson's disease, dementia, or amyotrophic lateral sclerosis (ALS).   Being given general anesthetic for procedures.   Being in a coma (unconscious).   Having a narrowing of the tube that carries food to the stomach (esophagus).   Drinking too much alcohol. If a person passes out and vomits, vomit can be swallowed into the lungs.   Taking certain medicines, such as tranquilizers or sedatives.  SIGNS AND SYMPTOMS   Coughing after swallowing food or liquids.   Breathing problems, such as wheezing or shortness of breath.   Bluish skin. This can be caused by lack of oxygen.   Coughing up food or mucus. The mucus might contain blood, greenish material, or yellowish-white fluid (pus).   Fever.   Chest pain.   Being more tired than usual (fatigue).   Sweating more than usual.   Bad breath.  DIAGNOSIS  A physical exam will be done. During the exam, the health care provider will listen to your lungs with a stethoscope to check for:   Crackling sounds in the lungs.  Decreased breath sounds.  A rapid heartbeat. Various tests may be ordered. These may include:   Chest X-ray.   CT scan.   Swallowing study. This test looks at how food is swallowed and whether it goes into your breathing tube (trachea) or  food pipe (esophagus).   Sputum culture. Saliva and mucus (sputum) are collected from the lungs or the tubes that carry air to the lungs (bronchi). The sputum is then tested for bacteria.   Bronchoscopy. This test uses a flexible tube (bronchoscope) to see inside the lungs. TREATMENT  Treatment will usually include antibiotic medicines. Other medicines may also be used to reduce fever or pain. You may need to be treated in the hospital. In the hospital, your breathing will be carefully monitored. Depending on how well you are breathing, you may need to be given oxygen, or you may need breathing support from a breathing machine (ventilator). For people who fail a swallowing study, a feeding tube might be placed in the stomach, or they may be asked to avoid certain food textures or liquids when they eat. HOME CARE INSTRUCTIONS   Carefully follow any special eating instructions you were given, such as avoiding certain food textures or thickening liquids. This reduces the risk of developing aspiration pneumonia again.  Only take over-the-counter or prescription medicines as directed by your health care provider. Follow the directions carefully.   If you were prescribed antibiotics, take them as directed. Finish them even if you start to feel better.   Rest as instructed by your health care provider.   Keep all follow-up appointments with your health care provider.  SEEK MEDICAL CARE IF:   You develop worsening shortness of breath, wheezing, or difficulty breathing.   You develop a fever.   You have chest pain.  MAKE SURE YOU:   Understand these instructions.  Will watch your condition.  Will get help right away if you are not doing well or get worse.   This information is not intended to replace advice given to you by your health care provider. Make sure you discuss any questions you have with your health care provider.   Document Released: 10/26/2008 Document Revised:  01/03/2013 Document Reviewed: 06/16/2012 Elsevier Interactive Patient Education [employer].

## 2015-03-07 NOTE — Telephone Encounter (Signed)
Patient called CHF clinic triage to update with yesterday's call regarding shoulder blade/back pain and hemoptysis and cough. States she didn't sleep well last night due to restlessness, cough, and pain, but after getting up and moving around this morning she feels a little better than yesterday. After thinking about the last few days, patient recalls having a bad choking spell while eating.  Aspirated while eating, choked until she was out of breath, and spent the rest of the day with a hacky cough. Patient is also on coumadin and thinks her hemoptysis is due to trama from choking spell and being on coumadin. Patient denied fever, chills, sweats, sputum, and feels better today than yesterday. Advised if she does not feel better or feels worse in next 24-48 hrs to go to PCP or urgent care for CXR to r/o bronchitis or PNA. Aware and agreeable to plan.  Renee Pain

## 2015-03-08 ENCOUNTER — Other Ambulatory Visit (HOSPITAL_COMMUNITY): Payer: Self-pay | Admitting: Internal Medicine

## 2015-03-08 ENCOUNTER — Encounter (HOSPITAL_BASED_OUTPATIENT_CLINIC_OR_DEPARTMENT_OTHER): Payer: Self-pay | Admitting: Emergency Medicine

## 2015-03-08 ENCOUNTER — Inpatient Hospital Stay (HOSPITAL_BASED_OUTPATIENT_CLINIC_OR_DEPARTMENT_OTHER)
Admission: EM | Admit: 2015-03-08 | Discharge: 2015-03-15 | DRG: 177 | Disposition: A | Discharge status: Skilled Nursing Facility | Payer: Medicare Other | Attending: Internal Medicine | Admitting: Internal Medicine

## 2015-03-08 ENCOUNTER — Emergency Department (HOSPITAL_BASED_OUTPATIENT_CLINIC_OR_DEPARTMENT_OTHER): Payer: Medicare Other

## 2015-03-08 DIAGNOSIS — N183 Chronic kidney disease, stage 3 (moderate): Secondary | ICD-10-CM | POA: Diagnosis not present

## 2015-03-08 DIAGNOSIS — Z7901 Long term (current) use of anticoagulants: Secondary | ICD-10-CM | POA: Diagnosis not present

## 2015-03-08 DIAGNOSIS — N189 Chronic kidney disease, unspecified: Secondary | ICD-10-CM | POA: Diagnosis not present

## 2015-03-08 DIAGNOSIS — I5022 Chronic systolic (congestive) heart failure: Secondary | ICD-10-CM | POA: Diagnosis present

## 2015-03-08 DIAGNOSIS — Z87891 Personal history of nicotine dependence: Secondary | ICD-10-CM

## 2015-03-08 DIAGNOSIS — E038 Other specified hypothyroidism: Secondary | ICD-10-CM | POA: Diagnosis not present

## 2015-03-08 DIAGNOSIS — I248 Other forms of acute ischemic heart disease: Secondary | ICD-10-CM | POA: Diagnosis present

## 2015-03-08 DIAGNOSIS — E039 Hypothyroidism, unspecified: Secondary | ICD-10-CM | POA: Diagnosis present

## 2015-03-08 DIAGNOSIS — I480 Paroxysmal atrial fibrillation: Secondary | ICD-10-CM | POA: Diagnosis present

## 2015-03-08 DIAGNOSIS — Z85828 Personal history of other malignant neoplasm of skin: Secondary | ICD-10-CM

## 2015-03-08 DIAGNOSIS — N179 Acute kidney failure, unspecified: Secondary | ICD-10-CM | POA: Diagnosis present

## 2015-03-08 DIAGNOSIS — Z95810 Presence of automatic (implantable) cardiac defibrillator: Secondary | ICD-10-CM | POA: Diagnosis not present

## 2015-03-08 DIAGNOSIS — D689 Coagulation defect, unspecified: Secondary | ICD-10-CM | POA: Insufficient documentation

## 2015-03-08 DIAGNOSIS — E876 Hypokalemia: Secondary | ICD-10-CM | POA: Diagnosis not present

## 2015-03-08 DIAGNOSIS — I5023 Acute on chronic systolic (congestive) heart failure: Secondary | ICD-10-CM | POA: Diagnosis not present

## 2015-03-08 DIAGNOSIS — M109 Gout, unspecified: Secondary | ICD-10-CM | POA: Diagnosis present

## 2015-03-08 DIAGNOSIS — I4819 Other persistent atrial fibrillation: Secondary | ICD-10-CM | POA: Diagnosis present

## 2015-03-08 DIAGNOSIS — J181 Lobar pneumonia, unspecified organism: Secondary | ICD-10-CM

## 2015-03-08 DIAGNOSIS — T17998A Other foreign object in respiratory tract, part unspecified causing other injury, initial encounter: Secondary | ICD-10-CM | POA: Diagnosis not present

## 2015-03-08 DIAGNOSIS — R042 Hemoptysis: Secondary | ICD-10-CM | POA: Diagnosis not present

## 2015-03-08 DIAGNOSIS — I422 Other hypertrophic cardiomyopathy: Secondary | ICD-10-CM | POA: Diagnosis present

## 2015-03-08 DIAGNOSIS — I481 Persistent atrial fibrillation: Secondary | ICD-10-CM | POA: Diagnosis present

## 2015-03-08 DIAGNOSIS — J9600 Acute respiratory failure, unspecified whether with hypoxia or hypercapnia: Secondary | ICD-10-CM | POA: Diagnosis present

## 2015-03-08 DIAGNOSIS — N184 Chronic kidney disease, stage 4 (severe): Secondary | ICD-10-CM | POA: Diagnosis present

## 2015-03-08 DIAGNOSIS — E034 Atrophy of thyroid (acquired): Secondary | ICD-10-CM | POA: Diagnosis not present

## 2015-03-08 DIAGNOSIS — I482 Chronic atrial fibrillation: Secondary | ICD-10-CM | POA: Diagnosis present

## 2015-03-08 DIAGNOSIS — I130 Hypertensive heart and chronic kidney disease with heart failure and stage 1 through stage 4 chronic kidney disease, or unspecified chronic kidney disease: Secondary | ICD-10-CM | POA: Diagnosis present

## 2015-03-08 DIAGNOSIS — E785 Hyperlipidemia, unspecified: Secondary | ICD-10-CM | POA: Diagnosis present

## 2015-03-08 DIAGNOSIS — J690 Pneumonitis due to inhalation of food and vomit: Principal | ICD-10-CM

## 2015-03-08 DIAGNOSIS — T17998D Other foreign object in respiratory tract, part unspecified causing other injury, subsequent encounter: Secondary | ICD-10-CM | POA: Diagnosis not present

## 2015-03-08 DIAGNOSIS — R0602 Shortness of breath: Secondary | ICD-10-CM | POA: Diagnosis present

## 2015-03-08 DIAGNOSIS — T17908A Unspecified foreign body in respiratory tract, part unspecified causing other injury, initial encounter: Secondary | ICD-10-CM

## 2015-03-08 HISTORY — DX: Pneumonitis due to inhalation of food and vomit: J69.0

## 2015-03-08 LAB — CBC WITH DIFFERENTIAL/PLATELET
Basophils Absolute: 0 10*3/uL (ref 0.0–0.1)
Basophils Relative: 0 %
Eosinophils Absolute: 0 10*3/uL (ref 0.0–0.7)
Eosinophils Relative: 0 %
HEMATOCRIT: 36 % (ref 36.0–46.0)
HEMOGLOBIN: 11.1 g/dL — AB (ref 12.0–15.0)
LYMPHS ABS: 0.4 10*3/uL — AB (ref 0.7–4.0)
LYMPHS PCT: 4 %
MCH: 24.8 pg — AB (ref 26.0–34.0)
MCHC: 30.8 g/dL (ref 30.0–36.0)
MCV: 80.5 fL (ref 78.0–100.0)
Monocytes Absolute: 0.9 10*3/uL (ref 0.1–1.0)
Monocytes Relative: 9 %
NEUTROS PCT: 87 %
Neutro Abs: 8.5 10*3/uL — ABNORMAL HIGH (ref 1.7–7.7)
Platelets: 243 10*3/uL (ref 150–400)
RBC: 4.47 MIL/uL (ref 3.87–5.11)
RDW: 18.6 % — ABNORMAL HIGH (ref 11.5–15.5)
WBC: 9.9 10*3/uL (ref 4.0–10.5)

## 2015-03-08 LAB — COMPREHENSIVE METABOLIC PANEL
ALK PHOS: 81 U/L (ref 38–126)
ALT: 21 U/L (ref 14–54)
AST: 36 U/L (ref 15–41)
Albumin: 4 g/dL (ref 3.5–5.0)
Anion gap: 13 (ref 5–15)
BUN: 44 mg/dL — AB (ref 6–20)
CHLORIDE: 92 mmol/L — AB (ref 101–111)
CO2: 29 mmol/L (ref 22–32)
CREATININE: 1.98 mg/dL — AB (ref 0.44–1.00)
Calcium: 9.2 mg/dL (ref 8.9–10.3)
GFR calc Af Amer: 26 mL/min — ABNORMAL LOW (ref >60)
GFR, EST NON AFRICAN AMERICAN: 23 mL/min — AB (ref >60)
GLUCOSE: 118 mg/dL — AB (ref 65–99)
POTASSIUM: 3.4 mmol/L — AB (ref 3.5–5.1)
Sodium: 134 mmol/L — ABNORMAL LOW (ref 135–145)
Total Bilirubin: 2 mg/dL — ABNORMAL HIGH (ref 0.3–1.2)
Total Protein: 7.7 g/dL (ref 6.5–8.1)

## 2015-03-08 LAB — BRAIN NATRIURETIC PEPTIDE: B NATRIURETIC PEPTIDE 5: 1414.9 pg/mL — AB (ref 0.0–100.0)

## 2015-03-08 LAB — PROTIME-INR
INR: 2.02 — AB (ref 0.00–1.49)
PROTHROMBIN TIME: 22.7 s — AB (ref 11.6–15.2)

## 2015-03-08 LAB — TROPONIN I
TROPONIN I: 0.41 ng/mL — AB (ref <0.031)
Troponin I: 0.24 ng/mL — ABNORMAL HIGH (ref <0.031)

## 2015-03-08 LAB — I-STAT CG4 LACTIC ACID, ED: LACTIC ACID, VENOUS: 1.91 mmol/L (ref 0.5–2.0)

## 2015-03-08 MED ORDER — WARFARIN SODIUM 2.5 MG PO TABS
2.5000 mg | ORAL_TABLET | Freq: Once | ORAL | Status: AC
  Administered 2015-03-08: 2.5 mg via ORAL
  Filled 2015-03-08: qty 1

## 2015-03-08 MED ORDER — POTASSIUM CHLORIDE CRYS ER 10 MEQ PO TBCR
10.0000 meq | EXTENDED_RELEASE_TABLET | Freq: Two times a day (BID) | ORAL | Status: DC
  Administered 2015-03-09 – 2015-03-11 (×5): 10 meq via ORAL
  Filled 2015-03-08 (×5): qty 1

## 2015-03-08 MED ORDER — MORPHINE SULFATE (PF) 2 MG/ML IV SOLN
INTRAVENOUS | Status: AC
  Filled 2015-03-08: qty 1

## 2015-03-08 MED ORDER — CLINDAMYCIN PHOSPHATE 600 MG/50ML IV SOLN
600.0000 mg | Freq: Once | INTRAVENOUS | Status: AC
Start: 2015-03-08 — End: 2015-03-08
  Administered 2015-03-08: 600 mg via INTRAVENOUS
  Filled 2015-03-08: qty 50

## 2015-03-08 MED ORDER — ACETAMINOPHEN 325 MG PO TABS
650.0000 mg | ORAL_TABLET | Freq: Four times a day (QID) | ORAL | Status: DC | PRN
  Administered 2015-03-08 – 2015-03-15 (×6): 650 mg via ORAL
  Filled 2015-03-08 (×7): qty 2

## 2015-03-08 MED ORDER — EZETIMIBE 10 MG PO TABS
10.0000 mg | ORAL_TABLET | ORAL | Status: DC
  Administered 2015-03-11 – 2015-03-15 (×3): 10 mg via ORAL
  Filled 2015-03-08 (×5): qty 1

## 2015-03-08 MED ORDER — PIPERACILLIN-TAZOBACTAM IN DEX 2-0.25 GM/50ML IV SOLN
2.2500 g | Freq: Three times a day (TID) | INTRAVENOUS | Status: DC
  Filled 2015-03-08 (×2): qty 50

## 2015-03-08 MED ORDER — MORPHINE SULFATE (PF) 2 MG/ML IV SOLN
INTRAVENOUS | Status: AC
  Administered 2015-03-08: 2 mg via INTRAVENOUS
  Filled 2015-03-08: qty 1

## 2015-03-08 MED ORDER — DEXTROSE 5 % IV SOLN
500.0000 mg | Freq: Once | INTRAVENOUS | Status: AC
  Administered 2015-03-08: 500 mg via INTRAVENOUS

## 2015-03-08 MED ORDER — PIPERACILLIN-TAZOBACTAM IN DEX 2-0.25 GM/50ML IV SOLN
2.2500 g | Freq: Four times a day (QID) | INTRAVENOUS | Status: DC
  Administered 2015-03-08 – 2015-03-11 (×11): 2.25 g via INTRAVENOUS
  Filled 2015-03-08 (×14): qty 50

## 2015-03-08 MED ORDER — FUROSEMIDE 40 MG PO TABS
ORAL_TABLET | ORAL | Status: AC
  Filled 2015-03-08: qty 1

## 2015-03-08 MED ORDER — ONDANSETRON HCL 4 MG/2ML IJ SOLN
4.0000 mg | Freq: Four times a day (QID) | INTRAMUSCULAR | Status: DC | PRN
  Administered 2015-03-11 – 2015-03-12 (×2): 4 mg via INTRAVENOUS
  Filled 2015-03-08 (×2): qty 2

## 2015-03-08 MED ORDER — LEVOTHYROXINE SODIUM 100 MCG PO TABS
100.0000 ug | ORAL_TABLET | Freq: Every day | ORAL | Status: DC
  Administered 2015-03-08 – 2015-03-14 (×7): 100 ug via ORAL
  Filled 2015-03-08 (×7): qty 1

## 2015-03-08 MED ORDER — DOBUTAMINE IN D5W 4-5 MG/ML-% IV SOLN
5.0000 ug/kg/min | INTRAVENOUS | Status: DC
  Administered 2015-03-08 – 2015-03-15 (×4): 5 ug/kg/min via INTRAVENOUS
  Filled 2015-03-08 (×6): qty 250

## 2015-03-08 MED ORDER — POTASSIUM CHLORIDE CRYS ER 20 MEQ PO TBCR: 40.0000 meq | EXTENDED_RELEASE_TABLET | Freq: Once | ORAL | Status: DC

## 2015-03-08 MED ORDER — DEXTROSE 5 % IV SOLN
1.0000 g | Freq: Once | INTRAVENOUS | Status: AC
  Administered 2015-03-08: 1 g via INTRAVENOUS
  Filled 2015-03-08: qty 10

## 2015-03-08 MED ORDER — ONDANSETRON HCL 4 MG PO TABS: 4.0000 mg | ORAL_TABLET | Freq: Four times a day (QID) | ORAL | Status: DC | PRN

## 2015-03-08 MED ORDER — WARFARIN - PHARMACIST DOSING INPATIENT: Freq: Every day | Status: DC

## 2015-03-08 MED ORDER — MORPHINE SULFATE (PF) 2 MG/ML IV SOLN
2.0000 mg | INTRAVENOUS | Status: AC | PRN
Start: 2015-03-08 — End: 2015-03-08
  Administered 2015-03-08 (×3): 2 mg via INTRAVENOUS
  Filled 2015-03-08 (×3): qty 1

## 2015-03-08 MED ORDER — TORSEMIDE 20 MG PO TABS
20.0000 mg | ORAL_TABLET | Freq: Two times a day (BID) | ORAL | Status: DC
  Administered 2015-03-08 – 2015-03-10 (×4): 20 mg via ORAL
  Filled 2015-03-08 (×4): qty 1

## 2015-03-08 MED ORDER — AZITHROMYCIN 500 MG IV SOLR
INTRAVENOUS | Status: AC
  Filled 2015-03-08: qty 500

## 2015-03-08 NOTE — H&P (Signed)
[facility] History and Physical  Sandra [name] [id number] DOB: 21-Jul-1932 DOA: 03/08/2015  Referring physician: EDP PCP: Garret Reddish, MD   Chief Complaint: Shortness of breath, pleuritic chest pain  HPI: Sandra [name] is a 82 y.o. female with past history of chronic systolic CHF/NICM with EF of 25%, low output state maintained on chronic dobutamine at home , AICD, paroxysmal A. fib on warfarin, hypothyroidism history of AV node ablation on 02/06/15 presents to the ER with the above complaint.. Per family patient choked on coronary and some vegetables over the weekend, on Wednesday started having cough congestion, some shortness of breath and some pleuritic pain in the right side of her chest. Subsequently the went to see her primary doctor Dr. Yong Channel yesterday who started her on oral clindamycin for presumed aspiration pneumonia.  History of subjective fevers and weakness over the last 2 days .She was only able to take 1 dose of her antibiotics however last night started having increasing cough with some blood tinge sputum. Her family subsequently took her to the emergency room at [place] at [facility] where she was noted to have aspiration pneumonia and subsequently admitted to the hospital. She denies any PND orthopnea, no leg swelling, no weight gain   Review of Systems:  positives bolded  Constitutional:  No weight loss, night sweats, Fevers, chills, fatigue.  HEENT:  No headaches, Difficulty swallowing,Tooth/dental problems,Sore throat,  No sneezing, itching, ear ache, nasal congestion, post nasal drip,  Cardio-vascular:  No chest pain, Orthopnea, PND, swelling in lower extremities, anasarca, dizziness, palpitations  GI:  No heartburn, indigestion, abdominal pain, nausea, vomiting, diarrhea, change in bowel habits, loss of appetite  Resp:  No shortness of breath with exertion or at rest. No excess mucus, no productive cough, No non-productive cough, No coughing  up of blood.No change in color of mucus.No wheezing.No chest wall deformity  Skin:  no rash or lesions.  GU:  no dysuria, change in color of urine, no urgency or frequency. No flank pain.  Musculoskeletal:  No joint pain or swelling. No decreased range of motion. No back pain.  Psych:  No change in mood or affect. No depression or anxiety. No memory loss.   Past Medical History  Diagnosis Date  . Atrial fibrillation ([place]) permanent     on Tikosyn and Coumadin  . Hyperlipidemia   . Hypothyroidism   . Bradycardia   . Cardiomyopathy, hypertrophic nonobstructive (HCC)     ejection fraction 25%  . Anal fissure   . Adenomatous colon polyp   . Diverticulosis   . ICD-CRT     generator change 2013  . Systolic heart failure   . Hyperlipidemia   . Hypertension   . RBBB plus LA hemiblock   . Family history of adverse reaction to anesthesia     "middle daughter couldn't take Versed"  . CHF (congestive heart failure) ([place])   . Varicose veins   . Chronic bronchitis ([place])     "get it most q spring and fall" (02/06/2015)  . Migraines     "stopped after I went thru the change"  . History of gout   . Situational anxiety   . Chronic renal insufficiency, stage III (moderate)     gd 3-4  . Basal cell carcinoma     "right foot; right leg" (02/06/2015)   Past Surgical History  Procedure Laterality Date  . Cardioversion    . Cardiac defibrillator placement  2007  . Vaginal hysterectomy  uterus alone  . Ovary surgery  1989    "tumor on one of my ovaries; benign  . Biv icd genertaor change out N/A 03/23/2011    Procedure: BIV ICD GENERTAOR CHANGE OUT;  Surgeon: Deboraha Sprang, MD;  Location: [facility] CATH LAB;  Service: Cardiovascular;  Laterality: N/A;  . Colonoscopy    . Cardiac catheterization N/A 10/29/2014    Procedure: Right Heart Cath;  Surgeon: Jolaine Artist, MD;  Location: [place] CV LAB;  Service: Cardiovascular;  Laterality: N/A;  . Cardioversion N/A 11/09/2014     Procedure: CARDIOVERSION;  Surgeon: Jolaine Artist, MD;  Location: [facility] ENDOSCOPY;  Service: Cardiovascular;  Laterality: N/A;  . Av node ablation  02/06/2015  . Cataract extraction w/ intraocular lens  implant, bilateral Bilateral 2000s  . Colonoscopy    . Picc line place peripheral ([facility] hx) Right 10/2014    arm  . Electrophysiologic study N/A 02/06/2015    Procedure: AV Node Ablation;  Surgeon: Deboraha Sprang, MD;  Location: [place] CV LAB;  Service: Cardiovascular;  Laterality: N/A;   Social History:  reports that she quit smoking about 28 years ago. Her smoking use included Cigarettes. She has a 3.8 pack-year smoking history. She has never used smokeless tobacco. She reports that she does not drink alcohol or use illicit drugs.  Allergies  Allergen Reactions  . Levofloxacin Other (See Comments)    Due to cardiac  AF  . Procaine Hcl Other (See Comments)    REACTION: smothers  . Spironolactone Diarrhea    Abdominal pain  . Amiodarone Nausea And Vomiting  . Tape Other (See Comments)    Tears skin off.  Please use "paper" tape only  . Amoxicillin Other (See Comments)    Rash   . Codeine Other (See Comments)    REACTION: nausea  . Niacin Other (See Comments)    REACTION: rash  . Ramipril Other (See Comments)    unknown  . Statins Other (See Comments)    REACTION: muscle aches    Family History  Problem Relation Age of Onset  . Heart disease Mother   . Colon cancer Father   . COPD Father   . Heart disease Father   . Cardiomyopathy Daughter   . Breast cancer Paternal Aunt   . Colon polyps Father   . Diabetes Neg Hx   . Kidney disease Neg Hx     Prior to Admission medications   Medication Sig Start Date End Date Taking? Authorizing Provider  acetaminophen (TYLENOL) 325 MG tablet Take 650 mg by mouth every 6 (six) hours as needed for mild pain or headache. Reported on 03/07/2015    Historical Provider, MD  clindamycin (CLEOCIN) 150 MG capsule Take 3 capsules (450 mg  total) by mouth 3 (three) times daily. 03/07/15   Marin Olp, MD  clorazepate (TRANXENE) 7.5 MG tablet Take 1 tablet (7.5 mg total) by mouth 2 (two) times daily as needed for anxiety. Patient not taking: Reported on 03/07/2015 12/31/14   Marin Olp, MD  DOBUTamine (DOBUTREX) 4-5 MG/ML-% infusion Inject 330 mcg/min into the vein continuous. 11/13/14   Amy D Ninfa Meeker, NP  ezetimibe (ZETIA) 10 MG tablet Take 10 mg by mouth every Monday, Wednesday, and Friday.     Historical Provider, MD  fluticasone (FLONASE) 50 MCG/ACT nasal spray Place 2 sprays into both nostrils daily. 09/27/14   Marin Olp, MD  levothyroxine (SYNTHROID, LEVOTHROID) 100 MCG tablet Take 100 mcg by mouth at  bedtime.    Historical Provider, MD  metolazone (ZAROXOLYN) 2.5 MG tablet Take 1 tablet (2.5 mg total) by mouth as directed. Patient not taking: Reported on 03/05/2015 02/25/15   Jolaine Artist, MD  potassium chloride SA (K-DUR,KLOR-CON) 10 MEQ tablet Take 1 tablet (10 mEq total) by mouth 2 (two) times daily. 03/06/15   Shirley Friar, PA-C  torsemide (DEMADEX) 20 MG tablet Take 2 tablets (40 mg total) by mouth 2 (two) times daily. 02/25/15   Jolaine Artist, MD  triamcinolone cream (KENALOG) 0.1 % Apply 1 application topically 2 (two) times daily as needed (itching/rash).    Historical Provider, MD  VITAMIN E PO Take 1 tablet by mouth daily.    Historical Provider, MD  warfarin (COUMADIN) 5 MG tablet Take 2.5-5 mg by mouth as directed. Take 2.5 mg (1/2 tablet) daily except 5 mg (1 tablet) on Monday    Historical Provider, MD   Physical Exam: Filed Vitals:   03/08/15 1445 03/08/15 1500 03/08/15 1601 03/08/15 1730  BP:  129/95 116/76 119/103  Pulse: 70 75 70 74  Temp:   98.9 F (37.2 C) 100.3 F (37.9 C)  TempSrc:    Oral  Resp: 17 18 24 18   Weight:      SpO2: 97% 97% 98% 96%    Wt Readings from Last 3 Encounters:  03/08/15 61.689 kg (136 lb)  03/07/15 61.689 kg (136 lb)  03/05/15 60.601 kg (133  lb 9.6 oz)    General:Thin, frail-appearing elderly female, laying in bed, no distress HEENT: PERRL, normal lids, irises & conjunctiva ENT: grossly normal hearing, lips & tongue Neck: no LAD, masses or thyromegaly Cardiovascular: Irregular rate and rhythm no m/r/g. No LE edema. Respiratory: Rhonchi in right middle and lower lobe  Abdomen: soft, ntnd Skin: no rash or induration seen on limited exam Musculoskeletal: grossly normal tone BUE/BLE Psychiatric: flat affect  Neurologic: grossly non-focal.          Labs on Admission:  Basic Metabolic Panel:  Recent Labs Lab 03/08/15 0120  NA 134*  K 3.4*  CL 92*  CO2 29  GLUCOSE 118*  BUN 44*  CREATININE 1.98*  CALCIUM 9.2   Liver Function Tests:  Recent Labs Lab 03/08/15 0120  AST 36  ALT 21  ALKPHOS 81  BILITOT 2.0*  PROT 7.7  ALBUMIN 4.0   No results for input(s): LIPASE, AMYLASE in the last 168 hours. No results for input(s): AMMONIA in the last 168 hours. CBC:  Recent Labs Lab 03/08/15 0120  WBC 9.9  NEUTROABS 8.5*  HGB 11.1*  HCT 36.0  MCV 80.5  PLT 243   Cardiac Enzymes:  Recent Labs Lab 03/08/15 0120  TROPONINI 0.24*    BNP (last 3 results)  Recent Labs  02/25/15 1115 03/08/15 0120  BNP 1364.1* 1414.9*    ProBNP (last 3 results) No results for input(s): PROBNP in the last 8760 hours.  CBG: No results for input(s): GLUCAP in the last 168 hours.  Radiological Exams on Admission: Dg Chest 2 View  03/08/2015  CLINICAL DATA:  Acute onset of shortness of breath and cough. Back pain. Initial encounter. EXAM: CHEST  2 VIEW COMPARISON:  Chest radiograph performed 03/07/2015 FINDINGS: Rapidly increasing right midlung airspace opacity is compatible with pneumonia. No pleural effusion or pneumothorax is seen. The cardiomediastinal silhouette is enlarged. A pacemaker/AICD is noted at the left chest wall, with leads ending at the right atrium, right ventricle and coronary sinus. A right PICC is  noted  ending at the proximal right atrium. No acute osseous abnormalities are seen. IMPRESSION: 1. Worsening right midlung opacity, compatible with pneumonia. 2. Cardiomegaly. Electronically Signed   By: Garald Balding M.D.   On: 03/08/2015 02:43   Dg Chest 2 View  03/07/2015  CLINICAL DATA:  Right-sided chest pain and hemoptysis for 2 days. EXAM: CHEST  2 VIEW COMPARISON:  Chest x-ray 02/06/2015 FINDINGS: The heart is enlarged but stable. There is tortuosity and calcification of the thoracic aorta. The pacer wires are stable. Right perihilar density is most likely pneumonia. It was not present on the prior chest x-ray. No pleural effusion. The left lung is clear. The bony thorax is intact. IMPRESSION: Right perihilar infiltrate likely in the superior segment of the right lower lobe based on the lateral film. Followup PA and lateral chest X-ray is recommended in 3-4 weeks following trial of antibiotic therapy to ensure resolution and exclude underlying malignancy. Electronically Signed   By: Marijo Sanes M.D.   On: 03/07/2015 16:04    EKG: Independently reviewed.V Paced  Assessment/Plan Principal Problem:   Aspiration pneumonia  -Following choking incident a few days ago -Will cover with IV Zosyn, remote history of rash with amoxicillin but daughter reports tolerating it again after initial rash. -Follow-up blood cultures -SLP evaluation   Chronic systolic CHF/ low output state on chronic dobutamine -EF is 25%, Clinically compensated -Continue dobutamine at home dose 4 MCG per kilogram per minute -Continue torsemide will decrease dose to 20 mg twice a day due to ongoing infection and increase insensory losses and decreased by mouth intake. -Cards Dr.Bensimhon aware of admission  Elevated Troponin -no symptoms of ACS -suspect due to demand/pneumonia/CKD -will trend troponins, no CAD based on cards notes   Persistent atrial fibrillation -Continue warfarin per pharmacy    Hypothyroidism -Continue Synthroid    Chronic kidney disease, stage III (moderate) -Creatinine stable at baseline  Code Status: Full code DVT Prophylaxis: On warfarin Family Communication: Tolerated bedside Disposition Plan: admit to SDU Time spent: 60min  Vyla Pint [facility] Pager [phone]

## 2015-03-08 NOTE — Progress Notes (Addendum)
Pharmacy Antibiotic Note  Sandra [name] is a 82 y.o. female admitted on 03/08/2015 with pneumonia.  Pharmacy has been consulted for zosyn dosing. She was admitted for symptoms of asp PNA. Zosyn has been ordered here empirically. She also has had multiple medical problems including CHF and afib. She is on coumadin for anticoagulation. That has also been ordered to be cont here. Her INR is therapeutic. PTA dose = 2.5mg  qday except 5mg  Mon  Plan: Zosyn 2.25g IV q6 Coumadin 2.5mg  PO x 1 today Daily INR  Weight: 136 lb (61.689 kg)  Temp (24hrs), Avg:98.9 F (37.2 C), Min:98.1 F (36.7 C), Max:100.3 F (37.9 C)   Recent Labs Lab 03/08/15 0120 03/08/15 0129  WBC 9.9  --   CREATININE 1.98*  --   LATICACIDVEN  --  1.91    Estimated Creatinine Clearance: 18.4 mL/min (by C-G formula based on Cr of 1.98).    Allergies  Allergen Reactions  . Levofloxacin Other (See Comments)    Due to cardiac  AF  . Procaine Hcl Other (See Comments)    REACTION: smothers  . Spironolactone Diarrhea    Abdominal pain  . Amiodarone Nausea And Vomiting  . Tape Other (See Comments)    Tears skin off.  Please use "paper" tape only  . Amoxicillin Other (See Comments)    Rash   . Codeine Other (See Comments)    REACTION: nausea  . Niacin Other (See Comments)    REACTION: rash  . Ramipril Other (See Comments)    unknown  . Statins Other (See Comments)    REACTION: muscle aches    Antimicrobials this admission: 2/24 Azith >> 2/24 2/24 rocephin >> 2/24 2/24 Clinda>>2/24 2/24 Zosyn>>  Dose adjustments this admission:   Microbiology results: 2/24 BCx:  2/24  MRSA PCR: neg  Thank you for allowing pharmacy to be a part of this patient's care.  Onnie Boer, PharmD Pager: [phone] 03/08/2015 6:24 PM

## 2015-03-08 NOTE — ED Provider Notes (Signed)
CSN: [id number]     Arrival date & time 03/08/15  0046 History   First MD Initiated Contact with Patient 03/08/15 0101     Chief Complaint  Patient presents with  . Shortness of Breath     (Consider location/radiation/quality/duration/timing/severity/associated sxs/prior Treatment) HPI 82 year old female who presents with shortness of breath and back pain. History of atrial fibrillation on Coumadin, hyperlipidemia, hypertrophic cardiomyopathy with an EF of 25% status post AICD, hypertension. She was seen by her primary care doctor today for shortness of breath, back pain, and cough developing over the course of one week after choking episode last weekend. States that she had choked on some corn and vegetables. Had a chest x-ray today at her PCPs office that was suggestive of aspiration pneumonia and was started on clindamycin. Did take a dose yesterday, but over the course of the evening and has had worsening pain over the right scapula and right posterior chest wall that was worse with deep inspiration and coughing. Was recently increased on her home dosage of torsemide in the setting of increased fluid retention, and states that she has not had any worsening edema, weight gain, orthopnea or PND. Pain not worsened with movement or exertion. He has had subjective fevers and chills. No vomiting, diarrhea.    Past Medical History  Diagnosis Date  . Atrial fibrillation ([place]) permanent     on Tikosyn and Coumadin  . Hyperlipidemia   . Hypothyroidism   . Bradycardia   . Cardiomyopathy, hypertrophic nonobstructive (HCC)     ejection fraction 25%  . Anal fissure   . Adenomatous colon polyp   . Diverticulosis   . ICD-CRT     generator change 2013  . Systolic heart failure   . Hyperlipidemia   . Hypertension   . RBBB plus LA hemiblock   . Family history of adverse reaction to anesthesia     "middle daughter couldn't take Versed"  . CHF (congestive heart failure) ([place])   . Varicose veins    . Chronic bronchitis ([place])     "get it most q spring and fall" (02/06/2015)  . Migraines     "stopped after I went thru the change"  . History of gout   . Situational anxiety   . Chronic renal insufficiency, stage III (moderate)     gd 3-4  . Basal cell carcinoma     "right foot; right leg" (02/06/2015)   Past Surgical History  Procedure Laterality Date  . Cardioversion    . Cardiac defibrillator placement  2007  . Vaginal hysterectomy      uterus alone  . Ovary surgery  1989    "tumor on one of my ovaries; benign  . Biv icd genertaor change out N/A 03/23/2011    Procedure: BIV ICD GENERTAOR CHANGE OUT;  Surgeon: Deboraha Sprang, MD;  Location: [facility] CATH LAB;  Service: Cardiovascular;  Laterality: N/A;  . Colonoscopy    . Cardiac catheterization N/A 10/29/2014    Procedure: Right Heart Cath;  Surgeon: Jolaine Artist, MD;  Location: [place] CV LAB;  Service: Cardiovascular;  Laterality: N/A;  . Cardioversion N/A 11/09/2014    Procedure: CARDIOVERSION;  Surgeon: Jolaine Artist, MD;  Location: [facility] ENDOSCOPY;  Service: Cardiovascular;  Laterality: N/A;  . Av node ablation  02/06/2015  . Cataract extraction w/ intraocular lens  implant, bilateral Bilateral 2000s  . Colonoscopy    . Picc line place peripheral ([facility] hx) Right 10/2014    arm  . Electrophysiologic  study N/A 02/06/2015    Procedure: AV Node Ablation;  Surgeon: Deboraha Sprang, MD;  Location: [place] CV LAB;  Service: Cardiovascular;  Laterality: N/A;   Family History  Problem Relation Age of Onset  . Heart disease Mother   . Colon cancer Father   . COPD Father   . Heart disease Father   . Cardiomyopathy Daughter   . Breast cancer Paternal Aunt   . Colon polyps Father   . Diabetes Neg Hx   . Kidney disease Neg Hx    Social History  Substance Use Topics  . Smoking status: Former Smoker -- 0.20 packs/day for 19 years    Types: Cigarettes    Quit date: 01/13/1987  . Smokeless tobacco: Never Used  .  Alcohol Use: No   OB History    No data available     Review of Systems 10/14 systems reviewed and are negative other than those stated in the HPI   Allergies  Levofloxacin; Procaine hcl; Spironolactone; Amiodarone; Tape; Amoxicillin; Codeine; Niacin; Ramipril; and Statins  Home Medications   Prior to Admission medications   Medication Sig Start Date End Date Taking? Authorizing Provider  acetaminophen (TYLENOL) 325 MG tablet Take 650 mg by mouth every 6 (six) hours as needed for mild pain or headache. Reported on 03/07/2015    Historical Provider, MD  clindamycin (CLEOCIN) 150 MG capsule Take 3 capsules (450 mg total) by mouth 3 (three) times daily. 03/07/15   Marin Olp, MD  clorazepate (TRANXENE) 7.5 MG tablet Take 1 tablet (7.5 mg total) by mouth 2 (two) times daily as needed for anxiety. Patient not taking: Reported on 03/07/2015 12/31/14   Marin Olp, MD  DOBUTamine (DOBUTREX) 4-5 MG/ML-% infusion Inject 330 mcg/min into the vein continuous. 11/13/14   Amy D Ninfa Meeker, NP  ezetimibe (ZETIA) 10 MG tablet Take 10 mg by mouth every Monday, Wednesday, and Friday.     Historical Provider, MD  fluticasone (FLONASE) 50 MCG/ACT nasal spray Place 2 sprays into both nostrils daily. 09/27/14   Marin Olp, MD  levothyroxine (SYNTHROID, LEVOTHROID) 100 MCG tablet Take 100 mcg by mouth at bedtime.    Historical Provider, MD  metolazone (ZAROXOLYN) 2.5 MG tablet Take 1 tablet (2.5 mg total) by mouth as directed. Patient not taking: Reported on 03/05/2015 02/25/15   Jolaine Artist, MD  potassium chloride SA (K-DUR,KLOR-CON) 10 MEQ tablet Take 1 tablet (10 mEq total) by mouth 2 (two) times daily. 03/06/15   Shirley Friar, PA-C  torsemide (DEMADEX) 20 MG tablet Take 2 tablets (40 mg total) by mouth 2 (two) times daily. 02/25/15   Jolaine Artist, MD  triamcinolone cream (KENALOG) 0.1 % Apply 1 application topically 2 (two) times daily as needed (itching/rash).    Historical  Provider, MD  VITAMIN E PO Take 1 tablet by mouth daily.    Historical Provider, MD  warfarin (COUMADIN) 5 MG tablet Take 2.5-5 mg by mouth as directed. Take 2.5 mg (1/2 tablet) daily except 5 mg (1 tablet) on Monday    Historical Provider, MD   BP 114/83 mmHg  Pulse 70  Temp(Src) 98.1 F (36.7 C) (Oral)  Resp 18  Wt 136 lb (61.689 kg)  SpO2 92% Physical Exam Physical Exam  Nursing note and vitals reviewed. Constitutional: Well developed, well nourished, non-toxic, and in no acute distress Head: Normocephalic and atraumatic.  Mouth/Throat: Oropharynx is clear and moist.  Neck: Normal range of motion. Neck supple.  Cardiovascular: Normal rate  and regular rhythm.  No edema. Pulmonary/Chest: Tachypnea. No accessory muscle usage. No conversational dyspnea. Diminished breath sounds over right mid to lower lung.  Abdominal: Soft. There is no tenderness. There is no rebound and no guarding.  Musculoskeletal: Normal range of motion.  Neurological: Alert, no facial droop, fluent speech, moves all extremities symmetrically Skin: Skin is warm and dry.  Psychiatric: Cooperative  ED Course  Procedures (including critical care time) Labs Review Labs Reviewed  CBC WITH DIFFERENTIAL/PLATELET - Abnormal; Notable for the following:    Hemoglobin 11.1 (*)    MCH 24.8 (*)    RDW 18.6 (*)    Neutro Abs 8.5 (*)    Lymphs Abs 0.4 (*)    All other components within normal limits  COMPREHENSIVE METABOLIC PANEL - Abnormal; Notable for the following:    Sodium 134 (*)    Potassium 3.4 (*)    Chloride 92 (*)    Glucose, Bld 118 (*)    BUN 44 (*)    Creatinine, Ser 1.98 (*)    Total Bilirubin 2.0 (*)    GFR calc non Af Amer 23 (*)    GFR calc Af Amer 26 (*)    All other components within normal limits  BRAIN NATRIURETIC PEPTIDE - Abnormal; Notable for the following:    B Natriuretic Peptide 1414.9 (*)    All other components within normal limits  TROPONIN I - Abnormal; Notable for the  following:    Troponin I 0.24 (*)    All other components within normal limits  PROTIME-INR - Abnormal; Notable for the following:    Prothrombin Time 22.7 (*)    INR 2.02 (*)    All other components within normal limits  CULTURE, BLOOD (ROUTINE X 2)  CULTURE, BLOOD (ROUTINE X 2)  I-STAT CG4 LACTIC ACID, ED  I-STAT CG4 LACTIC ACID, ED    Imaging Review Dg Chest 2 View  03/08/2015  CLINICAL DATA:  Acute onset of shortness of breath and cough. Back pain. Initial encounter. EXAM: CHEST  2 VIEW COMPARISON:  Chest radiograph performed 03/07/2015 FINDINGS: Rapidly increasing right midlung airspace opacity is compatible with pneumonia. No pleural effusion or pneumothorax is seen. The cardiomediastinal silhouette is enlarged. A pacemaker/AICD is noted at the left chest wall, with leads ending at the right atrium, right ventricle and coronary sinus. A right PICC is noted ending at the proximal right atrium. No acute osseous abnormalities are seen. IMPRESSION: 1. Worsening right midlung opacity, compatible with pneumonia. 2. Cardiomegaly. Electronically Signed   By: Garald Balding M.D.   On: 03/08/2015 02:43   Dg Chest 2 View  03/07/2015  CLINICAL DATA:  Right-sided chest pain and hemoptysis for 2 days. EXAM: CHEST  2 VIEW COMPARISON:  Chest x-ray 02/06/2015 FINDINGS: The heart is enlarged but stable. There is tortuosity and calcification of the thoracic aorta. The pacer wires are stable. Right perihilar density is most likely pneumonia. It was not present on the prior chest x-ray. No pleural effusion. The left lung is clear. The bony thorax is intact. IMPRESSION: Right perihilar infiltrate likely in the superior segment of the right lower lobe based on the lateral film. Followup PA and lateral chest X-ray is recommended in 3-4 weeks following trial of antibiotic therapy to ensure resolution and exclude underlying malignancy. Electronically Signed   By: Marijo Sanes M.D.   On: 03/07/2015 16:04   I have  personally reviewed and evaluated these images and lab results as part of my medical decision-making.  EKG Interpretation   Date/Time:  Friday March 08 2015 01:42:22 EST Ventricular Rate:  70 PR Interval:    QRS Duration: 182 QT Interval:  466 QTC Calculation: 503 R Axis:   -55 Text Interpretation:  Afib/flutter and ventricular-paced rhythm No further  analysis attempted due to paced rhythm No significant change from prior   Confirmed by Yamato Kopf MD, Zully Frane [id number]) on 03/08/2015 2:11:38 AM      MDM   Final diagnoses:  Aspiration pneumonia of right middle lobe, unspecified aspiration pneumonia type ([place])  Lobar pneumonia ([place])    82 year old female who presents with cough, shortness of breath, and pleuritic right scapular pain. On arrival is on room air, mildly tachypnea, but in no respiratory distress. Is afebrile hemodynamically stable. Appears to be euvolemic, and is currently at her dry weight of 136 pounds. X-ray here concerning for developing right middle lobe pneumonia, appearing worse than her x-ray earlier today. Has no significant leukocytosis and a normal lactate. EKG is nonischemic, and her troponin is negative. She is therapeutic on her INR. No significant inordinate in damage. Elevated BNP, but clinically does not seem overloaded and unlikely to be CHF. Pleuritic pain likely due to her pneumonia. Pain improved with morphine, but is noted to have downtrending pulse ox into the low 90 percentile during observation in the ED. Is given a dose of clindamycin for likely aspiration pneumonia, and covered for community-acquired pneumonia with ceftriaxone and azithromycin. Due to her age and comorbidities and potential for complications, I have admitted her to [facility] for observation for right middle lobe pneumonia. Discussed with dr. Hayden Pedro, MD 03/08/15 [phone]

## 2015-03-08 NOTE — ED Notes (Signed)
Tolerating sprite with small sipd

## 2015-03-08 NOTE — ED Notes (Signed)
[facility] here at this time to transport patient to [facility]

## 2015-03-08 NOTE — Progress Notes (Signed)
Pt transferred to 3W25 due to pt being on a Dobutamine drip. Pt family at bedside.

## 2015-03-08 NOTE — ED Notes (Signed)
Report given to [facility] Christy, RN 

## 2015-03-08 NOTE — Progress Notes (Signed)
HF team aware of admission. I will see in am. Please call with questions.  Trinidy Masterson,MD 5:29 PM

## 2015-03-08 NOTE — ED Notes (Signed)
Patient is transferreing to [facility] room [phone] by [facility]

## 2015-03-08 NOTE — Plan of Care (Signed)
82 year old female with history of nonischemic cardiomyopathy who was recently treated for CHF exacerbation had choked on some vegetables and has been treated on clindamycin by patient's primary care physician yesterday for aspiration despite which patient still has some shortness of breath and had some chest pain. Chest x-ray shows pneumonic process and has been admitted for further management.  Gean Birchwood.

## 2015-03-08 NOTE — ED Notes (Signed)
Pt states got chocked a few days ago on vegetables and was diagnosed with aspiration pneumonia. Pt has increased SOB and pain on right side.

## 2015-03-09 ENCOUNTER — Inpatient Hospital Stay (HOSPITAL_COMMUNITY): Payer: Medicare Other

## 2015-03-09 DIAGNOSIS — I481 Persistent atrial fibrillation: Secondary | ICD-10-CM

## 2015-03-09 DIAGNOSIS — N179 Acute kidney failure, unspecified: Secondary | ICD-10-CM | POA: Insufficient documentation

## 2015-03-09 DIAGNOSIS — N189 Chronic kidney disease, unspecified: Secondary | ICD-10-CM

## 2015-03-09 DIAGNOSIS — J181 Lobar pneumonia, unspecified organism: Secondary | ICD-10-CM

## 2015-03-09 LAB — TROPONIN I
TROPONIN I: 0.51 ng/mL — AB (ref <0.031)
Troponin I: 0.44 ng/mL — ABNORMAL HIGH (ref <0.031)

## 2015-03-09 LAB — PROTIME-INR
INR: 2.36 — ABNORMAL HIGH (ref 0.00–1.49)
Prothrombin Time: 25.5 seconds — ABNORMAL HIGH (ref 11.6–15.2)

## 2015-03-09 LAB — CBC
HCT: 33.8 % — ABNORMAL LOW (ref 36.0–46.0)
Hemoglobin: 10.1 g/dL — ABNORMAL LOW (ref 12.0–15.0)
MCH: 23.9 pg — AB (ref 26.0–34.0)
MCHC: 29.9 g/dL — ABNORMAL LOW (ref 30.0–36.0)
MCV: 79.9 fL (ref 78.0–100.0)
PLATELETS: 193 10*3/uL (ref 150–400)
RBC: 4.23 MIL/uL (ref 3.87–5.11)
RDW: 18.5 % — AB (ref 11.5–15.5)
WBC: 12.9 10*3/uL — AB (ref 4.0–10.5)

## 2015-03-09 LAB — BASIC METABOLIC PANEL
Anion gap: 16 — ABNORMAL HIGH (ref 5–15)
BUN: 49 mg/dL — AB (ref 6–20)
CALCIUM: 9.4 mg/dL (ref 8.9–10.3)
CO2: 28 mmol/L (ref 22–32)
Chloride: 90 mmol/L — ABNORMAL LOW (ref 101–111)
Creatinine, Ser: 2.16 mg/dL — ABNORMAL HIGH (ref 0.44–1.00)
GFR calc Af Amer: 23 mL/min — ABNORMAL LOW (ref >60)
GFR, EST NON AFRICAN AMERICAN: 20 mL/min — AB (ref >60)
GLUCOSE: 112 mg/dL — AB (ref 65–99)
POTASSIUM: 3.8 mmol/L (ref 3.5–5.1)
Sodium: 134 mmol/L — ABNORMAL LOW (ref 135–145)

## 2015-03-09 MED ORDER — TRAMADOL HCL 50 MG PO TABS
25.0000 mg | ORAL_TABLET | Freq: Four times a day (QID) | ORAL | Status: DC | PRN
  Administered 2015-03-09: 25 mg via ORAL
  Administered 2015-03-09: 50 mg via ORAL
  Administered 2015-03-10 – 2015-03-14 (×7): 25 mg via ORAL
  Filled 2015-03-09 (×10): qty 1

## 2015-03-09 MED ORDER — TRAMADOL HCL 50 MG PO TABS
25.0000 mg | ORAL_TABLET | Freq: Four times a day (QID) | ORAL | Status: DC | PRN
  Administered 2015-03-09 (×2): 25 mg via ORAL
  Filled 2015-03-09 (×2): qty 1

## 2015-03-09 MED ORDER — LEVALBUTEROL HCL 0.63 MG/3ML IN NEBU
0.6300 mg | INHALATION_SOLUTION | Freq: Four times a day (QID) | RESPIRATORY_TRACT | Status: DC | PRN
  Administered 2015-03-10: 0.63 mg via RESPIRATORY_TRACT
  Filled 2015-03-09: qty 3

## 2015-03-09 MED ORDER — WARFARIN SODIUM 2.5 MG PO TABS
2.5000 mg | ORAL_TABLET | Freq: Once | ORAL | Status: AC
  Administered 2015-03-09: 2.5 mg via ORAL
  Filled 2015-03-09: qty 1

## 2015-03-09 MED ORDER — LEVALBUTEROL HCL 0.63 MG/3ML IN NEBU
0.6300 mg | INHALATION_SOLUTION | Freq: Three times a day (TID) | RESPIRATORY_TRACT | Status: DC
  Administered 2015-03-09 (×2): 0.63 mg via RESPIRATORY_TRACT
  Filled 2015-03-09 (×3): qty 3

## 2015-03-09 NOTE — Evaluation (Signed)
Clinical/Bedside Swallow Evaluation Patient Details  Name: Sandra [name] MRN: [id number] Date of Birth: [birth year]  Today's Date: 03/09/2015 Time: SLP Start Time (ACUTE ONLY): 1206 SLP Stop Time (ACUTE ONLY): 1230 SLP Time Calculation (min) (ACUTE ONLY): 24 min  Past Medical History:  Past Medical History  Diagnosis Date  . Atrial fibrillation ([place]) permanent     on Tikosyn and Coumadin  . Hyperlipidemia   . Hypothyroidism   . Bradycardia   . Cardiomyopathy, hypertrophic nonobstructive (HCC)     ejection fraction 25%  . Anal fissure   . Adenomatous colon polyp   . Diverticulosis   . ICD-CRT     generator change 2013  . Systolic heart failure   . Hyperlipidemia   . Hypertension   . RBBB plus LA hemiblock   . Family history of adverse reaction to anesthesia     "middle daughter couldn't take Versed"  . CHF (congestive heart failure) ([place])   . Varicose veins   . Chronic bronchitis ([place])     "get it most q spring and fall" (02/06/2015)  . Migraines     "stopped after I went thru the change"  . History of gout   . Situational anxiety   . Chronic renal insufficiency, stage III (moderate)     gd 3-4  . Basal cell carcinoma     "right foot; right leg" (02/06/2015)  . Aspiration pneumonia ([place]) 03/08/2015    Archie Endo 03/08/2015   Past Surgical History:  Past Surgical History  Procedure Laterality Date  . Cardioversion    . Cardiac defibrillator placement  2007  . Vaginal hysterectomy      uterus alone  . Ovary surgery  1989    "tumor on one of my ovaries; benign  . Biv icd genertaor change out N/A 03/23/2011    Procedure: BIV ICD GENERTAOR CHANGE OUT;  Surgeon: Deboraha Sprang, MD;  Location: [facility] CATH LAB;  Service: Cardiovascular;  Laterality: N/A;  . Colonoscopy    . Cardiac catheterization N/A 10/29/2014    Procedure: Right Heart Cath;  Surgeon: Jolaine Artist, MD;  Location: [place] CV LAB;  Service: Cardiovascular;  Laterality: N/A;  . Cardioversion N/A  11/09/2014    Procedure: CARDIOVERSION;  Surgeon: Jolaine Artist, MD;  Location: [facility] ENDOSCOPY;  Service: Cardiovascular;  Laterality: N/A;  . Av node ablation  02/06/2015  . Cataract extraction w/ intraocular lens  implant, bilateral Bilateral 2000s  . Colonoscopy    . Picc line place peripheral ([facility] hx) Right 10/2014    arm  . Electrophysiologic study N/A 02/06/2015    Procedure: AV Node Ablation;  Surgeon: Deboraha Sprang, MD;  Location: [place] CV LAB;  Service: Cardiovascular;  Laterality: N/A;   HPI:  82 y.o. female with past history of chronic systolic CHF/NICM with EF of 25%, low output state maintained on chronic dobutamine at home , AICD, paroxysmal A. fib on warfarin, hypothyroidism history of AV node ablation on 02/06/15 presents to the ER with the above complaint..   Assessment / Plan / Recommendation Clinical Impression   Pt did not exhibit overt s/s of aspiration with intake of various consistencies, although, she did throat clear x1 (delayed) with a sip of water initially, but after she cleared, she did not have any other signs of dysphagia throughout consumption of her lunch meal; pt has a dx of aspiration PNA and this may have been an isolated event, but ST will f/u 1-2x to ensure this is not a  chronic problem; discussed in depth with pt/family need for smaller bites/sips throughout meals and to use general swallowing precautions to reduce the risk for future aspiration events.  Pt stated to SLP that she has a "sensitive trachea" and has had this difficulty since she was a teenager with bouts of bronchitis (seasonal), but this is her first PNA episode.  She also shared that her Mother had to have her "esophagus stretched several times", so a genetic link may be considered as well.   An ENT re-assessment may be warranted at this time if pt agrees to r/o any structural concerns. Recommend continue current diet.    Aspiration Risk  Mild aspiration risk    Diet Recommendation    Regular (heart healthy)/thin  Medication Administration: Other (Comment) (whole with liquids as tolerated)    Other  Recommendations Recommended Consults: Consider ENT evaluation;Other (Comment) (if pt interested; has seen in past; re-assessment?) Oral Care Recommendations: Oral care BID   Follow up Recommendations  None    Frequency and Duration min 2x/week  1 week       Prognosis Prognosis for Safe Diet Advancement: Good      Swallow Study   General Date of Onset: 03/08/15 HPI: 82 y.o. female with past history of chronic systolic CHF/NICM with EF of 25%, low output state maintained on chronic dobutamine at home , AICD, paroxysmal A. fib on warfarin, hypothyroidism history of AV node ablation on 02/06/15 presents to the ER with the above complaint.. Type of Study: Bedside Swallow Evaluation Diet Prior to this Study: Regular;Thin liquids;Other (Comment) (low sodium; heart healthy) Temperature Spikes Noted: Yes Respiratory Status: Nasal cannula History of Recent Intubation: No Behavior/Cognition: Alert;Cooperative;Pleasant mood Oral Cavity Assessment: Within Functional Limits Oral Care Completed by SLP: No Oral Cavity - Dentition: Adequate natural dentition Vision: Functional for self-feeding Self-Feeding Abilities: Able to feed self Patient Positioning: Upright in bed Baseline Vocal Quality: Hoarse Volitional Cough: Strong Volitional Swallow: Able to elicit    Oral/Motor/Sensory Function Overall Oral Motor/Sensory Function: Within functional limits   Ice Chips Ice chips: Within functional limits Presentation: Spoon   Thin Liquid Thin Liquid: Impaired Presentation: Cup;Straw Pharyngeal  Phase Impairments: Multiple swallows;Throat Clearing - Delayed Other Comments:  (delayed throat clearing only occurred initially)    Nectar Thick Nectar Thick Liquid: Not tested   Honey Thick Honey Thick Liquid: Not tested   Puree Puree: Within functional limits Presentation: Spoon    Solid      Solid: Within functional limits Presentation: Self Fed        Megen Madewell,PAT, M.S., CCC-SLP 03/09/2015,1:09 PM

## 2015-03-09 NOTE — Progress Notes (Signed)
CRITICAL VALUE ALERT  Critical value received:  Troponin 0.51  Date of notification:  03/09/2015  Time of notification:  0425  Critical value read back:yes  Nurse who received alert: Zachary George RN  MD notified (1st page): Walden Field NP  Time of first page:  [phone]  MD notified (2nd page):  Time of second page:  Responding MD:    Time MD responded: lab was reported to day shift nurse to follow up with rounding MD

## 2015-03-09 NOTE — Progress Notes (Signed)
Pt RLLL pain continues. Pt claims 1/2 pill ( 25 mg ) of tramadol is not helping her. Requesting for MD to increase dosage. thanks

## 2015-03-09 NOTE — Consult Note (Signed)
Advanced Heart Failure Team Consult Note  Referring Physician: [facility] Renne Crigler) Primary Cardiologist:  DB  Reason for Consultation: HF  HPI:    Sandra [name] is a 82 y.o. female with h/o PAF s/p AVN ablation, chronic systolic HF due to NICM on home dobutamine (EF 25%), hypothyroidism, St [employer] CRT-D and CKD stage 4 admitted with aspiration PNA.   Had been doing ok. Per notes, patient choked on coronary and some vegetables over the weekend, on Wednesday started having cough congestion, some shortness of breath and some pleuritic pain in the right side of her chest. Seen by PCP and started clinda but respiratory failure got worse. CXR showed aspiration PNA and she was admitted. Chest CT this morning with dense RLL infiltrate  Still with severe CP with deep breathing. + cough   Review of Systems: [y] = yes, [ ]  = no   General: Weight gain [ ] ; Weight loss [ ] ; Anorexia [ ] ; Fatigue [ y]; Fever [ y]; Chills [ ] ; Weakness [ y]  Cardiac: Chest pain/pressure [ y]; Resting SOB [ y]; Exertional SOB Blue.Reese ]; Orthopnea [ ] ; Pedal Edema [ ] ; Palpitations [ ] ; Syncope [ ] ; Presyncope [ ] ; Paroxysmal nocturnal dyspnea[ ]   Pulmonary: Cough [ y]; Wheezing[ ] ; Hemoptysis[ ] ; Sputum [ ] ; Snoring [ ]   GI: Vomiting[ ] ; Dysphagia[ ] ; Melena[ ] ; Hematochezia [ ] ; Heartburn[ ] ; Abdominal pain [ ] ; Constipation [ ] ; Diarrhea [ ] ; BRBPR [ ]   GU: Hematuria[ ] ; Dysuria [ ] ; Nocturia[ ]   Vascular: Pain in legs with walking [ ] ; Pain in feet with lying flat [ ] ; Non-healing sores [ ] ; Stroke [ ] ; TIA [ ] ; Slurred speech [ ] ;  Neuro: Headaches[ ] ; Vertigo[ ] ; Seizures[ ] ; Paresthesias[ ] ;Blurred vision [ ] ; Diplopia [ ] ; Vision changes [ ]   Ortho/Skin: Arthritis [ ] ; Joint pain [ ] ; Muscle pain [ ] ; Joint swelling [ ] ; Back Pain [ ] ; Rash [ ]   Psych: Depression[y ]; Anxiety[ ]   Heme: Bleeding problems [ ] ; Clotting disorders [ ] ; Anemia [ ]   Endocrine: Diabetes [ ] ; Thyroid dysfunction[y ]  Home Medications Prior  to Admission medications   Medication Sig Start Date End Date Taking? Authorizing Provider  acetaminophen (TYLENOL) 325 MG tablet Take 650 mg by mouth every 6 (six) hours as needed for mild pain or headache. Reported on 03/07/2015   Yes Historical Provider, MD  ezetimibe (ZETIA) 10 MG tablet Take 10 mg by mouth every Monday, Wednesday, and Friday.    Yes Historical Provider, MD  fluticasone (FLONASE) 50 MCG/ACT nasal spray Place 2 sprays into both nostrils daily. 09/27/14  Yes Marin Olp, MD  levothyroxine (SYNTHROID, LEVOTHROID) 100 MCG tablet Take 100 mcg by mouth at bedtime.   Yes Historical Provider, MD  potassium chloride SA (K-DUR,KLOR-CON) 10 MEQ tablet Take 1 tablet (10 mEq total) by mouth 2 (two) times daily. 03/06/15  Yes Shirley Friar, PA-C  torsemide (DEMADEX) 20 MG tablet Take 2 tablets (40 mg total) by mouth 2 (two) times daily. 02/25/15  Yes Jolaine Artist, MD  triamcinolone cream (KENALOG) 0.1 % Apply 1 application topically 2 (two) times daily as needed (itching/rash).   Yes Historical Provider, MD  VITAMIN E PO Take 1 tablet by mouth daily.   Yes Historical Provider, MD  warfarin (COUMADIN) 5 MG tablet Take 2.5-5 mg by mouth as directed. Take 2.5 mg (1/2 tablet) daily except 5 mg (1 tablet) on Monday   Yes Historical Provider, MD  clindamycin (CLEOCIN) 150 MG capsule Take 3 capsules (450 mg total) by mouth 3 (three) times daily. 03/07/15   Marin Olp, MD  DOBUTamine (DOBUTREX) 4-5 MG/ML-% infusion Inject 330 mcg/min into the vein continuous. 11/13/14   Conrad Lonoke, NP    Past Medical History: Past Medical History  Diagnosis Date  . Atrial fibrillation ([place]) permanent     on Tikosyn and Coumadin  . Hyperlipidemia   . Hypothyroidism   . Bradycardia   . Cardiomyopathy, hypertrophic nonobstructive (HCC)     ejection fraction 25%  . Anal fissure   . Adenomatous colon polyp   . Diverticulosis   . ICD-CRT     generator change 2013  . Systolic heart failure    . Hyperlipidemia   . Hypertension   . RBBB plus LA hemiblock   . Family history of adverse reaction to anesthesia     "middle daughter couldn't take Versed"  . CHF (congestive heart failure) ([place])   . Varicose veins   . Chronic bronchitis ([place])     "get it most q spring and fall" (02/06/2015)  . Migraines     "stopped after I went thru the change"  . History of gout   . Situational anxiety   . Chronic renal insufficiency, stage III (moderate)     gd 3-4  . Basal cell carcinoma     "right foot; right leg" (02/06/2015)  . Aspiration pneumonia ([place]) 03/08/2015    Archie Endo 03/08/2015    Past Surgical History: Past Surgical History  Procedure Laterality Date  . Cardioversion    . Cardiac defibrillator placement  2007  . Vaginal hysterectomy      uterus alone  . Ovary surgery  1989    "tumor on one of my ovaries; benign  . Biv icd genertaor change out N/A 03/23/2011    Procedure: BIV ICD GENERTAOR CHANGE OUT;  Surgeon: Deboraha Sprang, MD;  Location[facility] CATH LAB;  Service: Cardiovascular;  Laterality: N/A;  . Colonoscopy    . Cardiac catheterization N/A 10/29/2014    Procedure: Right Heart Cath;  Surgeon: Jolaine Artist, MD;  Location: [facility] CV LAB;  Service: Cardiovascular;  Laterality: N/A;  . Cardioversion N/A 11/09/2014    Procedure: CARDIOVERSION;  Surgeon: Jolaine Artist, MD;  Location: [facility] ENDOSCOPY;  Service: Cardiovascular;  Laterality: N/A;  . Av node ablation  02/06/2015  . Cataract extraction w/ intraocular lens  implant, bilateral Bilateral 2000s  . Colonoscopy    . Picc line place peripheral ([facility] hx) Right 10/2014    arm  . Electrophysiologic study N/A 02/06/2015    Procedure: AV Node Ablation;  Surgeon: Deboraha Sprang, MD;  Location: [facility] LAB;  Service: Cardiovascular;  Laterality: N/A;    Family History: Family History  Problem Relation Age of Onset  . Heart disease Mother   . Colon cancer Father   . COPD Father   . Heart disease Father     . Cardiomyopathy Daughter   . Breast cancer Paternal Aunt   . Colon polyps Father   . Diabetes Neg Hx   . Kidney disease Neg Hx     Social History: Social History   Social History  . Marital Status: Married    Spouse Name: N/A  . Number of Children: 3  . Years of Education: N/A   Occupational History  . Retired     Probation officer   Social History Main Topics  . Smoking status: Former Smoker --  0.20 packs/day for 19 years    Types: Cigarettes    Quit date: 01/13/1987  . Smokeless tobacco: Never Used  . Alcohol Use: No  . Drug Use: No  . Sexual Activity: No   Other Topics Concern  . None   Social History Narrative   Married 1953 (husband Clare Gandy in our Network engineer). 3 girls (1 is a patient here-[name]). 2 grandkids.       Retired in [place]: very active, going to church and church friends, movies    Allergies:  Allergies  Allergen Reactions  . Levofloxacin Other (See Comments)    Due to cardiac  AF  . Procaine Hcl Other (See Comments)    REACTION: smothers  . Spironolactone Diarrhea    Abdominal pain  . Amiodarone Nausea And Vomiting  . Tape Other (See Comments)    Tears skin off.  Please use "paper" tape only  . Amoxicillin Other (See Comments)    Rash   . Codeine Other (See Comments)    REACTION: nausea  . Niacin Other (See Comments)    REACTION: rash  . Ramipril Other (See Comments)    unknown  . Statins Other (See Comments)    REACTION: muscle aches    Objective:    Vital Signs:   Temp:  [98.7 F (37.1 C)-100.3 F (37.9 C)] 98.7 F (37.1 C) (02/25 0619) Pulse Rate:  [69-75] 70 (02/25 0619) Resp:  [15-24] 19 (02/25 0619) BP: (101-129)/(63-103) 102/63 mmHg (02/25 0619) SpO2:  [96 %-98 %] 98 % (02/25 1016) Last BM Date: 03/07/15  Weight change: Filed Weights   03/08/15 0056  Weight: 61.689 kg (136 lb)    Intake/Output:   Intake/Output Summary (Last 24 hours) at 03/09/15 1443 Last data filed at 03/09/15 1044   Gross per 24 hour  Intake 256.75 ml  Output   1075 ml  Net -818.25 ml     Physical Exam: General: weak appearing. NAD HEENT: normal Neck: supple. JVP 6-7. Carotids 2+ bilat; no bruits. No thyromegaly or nodule noted. Cor: PMI laterally displaced. regular Lungs: Crackles throughout RLL Abdomen: soft, NT, ND, no HSM. No bruits or masses. +BS  Extremities: no cyanosis, clubbing, rash, no edema Neuro: alert & oriented x 3, cranial nerves grossly intact. moves all 4 extremities w/o difficulty. Affect pleasant. Skin: no rash.  Telemetry: AF with v-pacing  Labs: Basic Metabolic Panel:  Recent Labs Lab 03/08/15 0120 03/09/15 0549  NA 134* 134*  K 3.4* 3.8  CL 92* 90*  CO2 29 28  GLUCOSE 118* 112*  BUN 44* 49*  CREATININE 1.98* 2.16*  CALCIUM 9.2 9.4    Liver Function Tests:  Recent Labs Lab 03/08/15 0120  AST 36  ALT 21  ALKPHOS 81  BILITOT 2.0*  PROT 7.7  ALBUMIN 4.0   No results for input(s): LIPASE, AMYLASE in the last 168 hours. No results for input(s): AMMONIA in the last 168 hours.  CBC:  Recent Labs Lab 03/08/15 0120 03/09/15 0549  WBC 9.9 12.9*  NEUTROABS 8.5*  --   HGB 11.1* 10.1*  HCT 36.0 33.8*  MCV 80.5 79.9  PLT 243 193    Cardiac Enzymes:  Recent Labs Lab 03/08/15 0120 03/08/15 1834 03/09/15 0025 03/09/15 0549  TROPONINI 0.24* 0.41* 0.51* 0.44*    BNP: BNP (last 3 results)  Recent Labs  02/25/15 1115 03/08/15 0120  BNP 1364.1* 1414.9*    ProBNP (last 3 results) No results for input(s): PROBNP in  the last 8760 hours.   CBG: No results for input(s): GLUCAP in the last 168 hours.  Coagulation Studies:  Recent Labs  03/08/15 0120 03/09/15 0549  LABPROT 22.7* 25.5*  INR 2.02* 2.36*    Other results: EKG:AF with v-pacing  Imaging: Dg Chest 2 View  03/08/2015  CLINICAL DATA:  Acute onset of shortness of breath and cough. Back pain. Initial encounter. EXAM: CHEST  2 VIEW COMPARISON:  Chest radiograph performed  03/07/2015 FINDINGS: Rapidly increasing right midlung airspace opacity is compatible with pneumonia. No pleural effusion or pneumothorax is seen. The cardiomediastinal silhouette is enlarged. A pacemaker/AICD is noted at the left chest wall, with leads ending at the right atrium, right ventricle and coronary sinus. A right PICC is noted ending at the proximal right atrium. No acute osseous abnormalities are seen. IMPRESSION: 1. Worsening right midlung opacity, compatible with pneumonia. 2. Cardiomegaly. Electronically Signed   By: Garald Balding M.D.   On: 03/08/2015 02:43   Ct Chest Wo Contrast  03/09/2015  CLINICAL DATA:  Hemoptysis. Evaluate for aspiration pneumonia. Cough, fever. EXAM: CT CHEST WITHOUT CONTRAST TECHNIQUE: Multidetector CT imaging of the chest was performed following the standard protocol without IV contrast. COMPARISON:  03/08/2015 FINDINGS: There is a small right pleural effusion. Consolidation noted throughout much of the right lower lobe, most confluent in the superior segment. This is most compatible with pneumonia. Aspiration pneumonia is possible. Left lung is clear except for minimal atelectasis or scarring at the left base. There is cardiomegaly. Small pericardial effusion. Left ICD device in place. Aortic calcifications without aneurysm. Mild mediastinal adenopathy. Precarinal lymph node has a short axis diameter of 20 mm. Right paratracheal lymph node has a short axis diameter of 10 mm. No visible hilar or axillary adenopathy. Imaging into the upper abdomen shows no acute findings. No acute bony abnormality or focal bone lesion. IMPRESSION: Consolidation throughout much of the right lower lobe, most confluent in the superior segment compatible with pneumonia. Aspiration is not excluded. Mild mediastinal adenopathy which may be reactive. This could be followed after treatment and resolution of acute symptoms, in 3-6 months to assure stability or resolution. Cardiomegaly. Small right  pleural effusion and pericardial effusion. Electronically Signed   By: Rolm Baptise M.D.   On: 03/09/2015 09:41      Medications:     Current Medications: . ezetimibe  10 mg Oral Q M,W,F  . levalbuterol  0.63 mg Nebulization Q8H  . levothyroxine  100 mcg Oral QHS  . piperacillin-tazobactam (ZOSYN)  IV  2.25 g Intravenous 4 times per day  . potassium chloride SA  10 mEq Oral BID  . potassium chloride  40 mEq Oral Once  . torsemide  20 mg Oral BID  . warfarin  2.5 mg Oral ONCE-1800  . Warfarin - Pharmacist Dosing Inpatient   Does not apply q1800     Infusions: . DOBUTamine 5 mcg/kg/min (03/08/15 2351)      Assessment:    1. Acute respiratory failure  2. Aspiration PNA - RLL on CT 3. Chronic systolic HF on home dobutamine 4. Chronic AF -s/p AV node ablation. Failed amio and Tikosyn.  5. Acute on CKD stage IV 1.9->2.2    Plan/Discussion:     She has severe aspiration PNA. Currently on abx per primary team.   HF stable. She appears a bit dry. Torsemide cut back. Can stop completely as needed. Would continue dobutamine.   She has chronic AF. Now s/p AVN ablation and BiV pacing. Remains  on coumadin   Minimally elevated troponin likely due to demand ischemia. No ischemic w/u needed at this point.   If PNA not improving can consider Pulmonary consult for bronch to look for retained foreign matter.   We will follow.   Length of Stay: 1  Bensimhon, Daniel MD 03/09/2015, 2:43 PM  Advanced Heart Failure Team Pager [phone] (M-F; [place])  Please contact [facility] for night-coverage after hours (4p -7a ) and weekends on [email or web address]

## 2015-03-09 NOTE — Progress Notes (Signed)
ANTICOAGULATION CONSULT NOTE - Follow Up Consult  Pharmacy Consult for Coumadin Indication: atrial fibrillation  Allergies  Allergen Reactions  . Levofloxacin Other (See Comments)    Due to cardiac  AF  . Procaine Hcl Other (See Comments)    REACTION: smothers  . Spironolactone Diarrhea    Abdominal pain  . Amiodarone Nausea And Vomiting  . Tape Other (See Comments)    Tears skin off.  Please use "paper" tape only  . Amoxicillin Other (See Comments)    Rash   . Codeine Other (See Comments)    REACTION: nausea  . Niacin Other (See Comments)    REACTION: rash  . Ramipril Other (See Comments)    unknown  . Statins Other (See Comments)    REACTION: muscle aches    Patient Measurements: Height: 5\' 4"  (162.6 cm) Weight: 136 lb (61.689 kg) IBW/kg (Calculated) : 54.7  Vital Signs: Temp: 98.7 F (37.1 C) (02/25 0619) Temp Source: Oral (02/25 0619) BP: 102/63 mmHg (02/25 0619) Pulse Rate: 70 (02/25 0619)  Labs:  Recent Labs  03/08/15 0120 03/08/15 1834 03/09/15 0025 03/09/15 0549  HGB 11.1*  --   --  10.1*  HCT 36.0  --   --  33.8*  PLT 243  --   --  193  LABPROT 22.7*  --   --  25.5*  INR 2.02*  --   --  2.36*  CREATININE 1.98*  --   --  2.16*  TROPONINI 0.24* 0.41* 0.51* 0.44*    Estimated Creatinine Clearance: 17.6 mL/min (by C-G formula based on Cr of 2.16).   Medications:  Prescriptions prior to admission  Medication Sig Dispense Refill Last Dose  . acetaminophen (TYLENOL) 325 MG tablet Take 650 mg by mouth every 6 (six) hours as needed for mild pain or headache. Reported on 03/07/2015   PRN  . ezetimibe (ZETIA) 10 MG tablet Take 10 mg by mouth every Monday, Wednesday, and Friday.    03/08/2015 at Unknown time  . fluticasone (FLONASE) 50 MCG/ACT nasal spray Place 2 sprays into both nostrils daily. 16 g 6 03/07/2015 at Unknown time  . levothyroxine (SYNTHROID, LEVOTHROID) 100 MCG tablet Take 100 mcg by mouth at bedtime.   03/07/2015 at Unknown time  .  potassium chloride SA (K-DUR,KLOR-CON) 10 MEQ tablet Take 1 tablet (10 mEq total) by mouth 2 (two) times daily.   03/08/2015 at Unknown time  . torsemide (DEMADEX) 20 MG tablet Take 2 tablets (40 mg total) by mouth 2 (two) times daily. 120 tablet 6 03/08/2015 at Unknown time  . triamcinolone cream (KENALOG) 0.1 % Apply 1 application topically 2 (two) times daily as needed (itching/rash).   PRN  . VITAMIN E PO Take 1 tablet by mouth daily.   03/08/2015 at Unknown time  . warfarin (COUMADIN) 5 MG tablet Take 2.5-5 mg by mouth as directed. Take 2.5 mg (1/2 tablet) daily except 5 mg (1 tablet) on Monday   03/07/2015 at Unknown time  . clindamycin (CLEOCIN) 150 MG capsule Take 3 capsules (450 mg total) by mouth 3 (three) times daily. 63 capsule 0   . DOBUTamine (DOBUTREX) 4-5 MG/ML-% infusion Inject 330 mcg/min into the vein continuous. 250 mL 6 Taking    Assessment: 82 yo F on Coumadin PTA for hx afib.  INR therapeutic on admission.  Will continue with home dose.  Will continue daily INR given acute illness and potential for INR to increase.  Goal of Therapy:  INR 2-3 Monitor platelets by anticoagulation  protocol: Yes   Plan:  Coumadin 2.5 mg PO x 1 tonight Daily INR  [employer], Pharm.D., BCPS Clinical Pharmacist Pager [phone] 03/09/2015 8:25 AM

## 2015-03-09 NOTE — Progress Notes (Addendum)
PROGRESS NOTE  DEVONNE SANTELLANO [id number] DOB: 10-Apr-1932 DOA: 03/08/2015 PCP: Garret Reddish, MD Outpatient Specialists:    LOS: 1 day   Brief Narrative: is a 82 y.o. female with past history of chronic systolic CHF/NICM with EF of 25%, low output state maintained on chronic dobutamine at home , AICD, paroxysmal A. fib on warfarin, hypothyroidism history of AV node ablation on 02/06/15, admitted on 2/24 with shortness of breath and pleuritic chest pain after episode of aspiration at home several days prior.  Assessment & Plan: Principal Problem:   Aspiration pneumonia ([place]) Active Problems:   Hypothyroidism   Chronic kidney disease, stage III (moderate)   Chronic systolic heart failure (HCC)   Persistent atrial fibrillation (HCC)   Lobar pneumonia (HCC)   Aspiration pneumonia  - Following choking incident a few days ago - Will cover with IV Zosyn, remote history of rash with amoxicillin but daughter reports tolerating it again after initial rash. Tolerating - Follow-up blood cultures - SLP evaluation - hemoptysis this morning, will obtain CT chest   Chronic systolic CHF/ low output state on chronic dobutamine - EF is 25%, Clinically compensated - Continue dobutamine at home dose 4 MCG per kilogram per minute - Continue torsemide will decrease dose to 20 mg twice a day due to ongoing infection and increase insensory losses and decreased by mouth intake.  - Cards Dr.Bensimhon aware of admission, discussed wit him this morning  Elevated Troponin - no symptoms of ACS - suspect due to demand/pneumonia/CKD - no CAD based on cards notes  Persistent atrial fibrillation - Continue warfarin per pharmacy  Hypothyroidism - Continue Synthroid  Chronic kidney disease, stage IV - Creatinine stable at baseline, slight worsening today   DVT prophylaxis: Coumadin Code Status: Full Family Communication: d/w daughter bedside Disposition Plan: home when ready  Barriers for  discharge: IV antibiotics  Consultants:   Cardiology   Procedures:   None   Antimicrobials:  Zosyn 2/24 >>   Subjective: - ongoing pleuritic right sided chest pain. Mild dyspnea due to inability to take deep breaths. Crying today due to pain  Objective: Filed Vitals:   03/08/15 2339 03/09/15 0000 03/09/15 0619 03/09/15 1016  BP: 101/65  102/63   Pulse: 69  70   Temp: 99.7 F (37.6 C)  98.7 F (37.1 C)   TempSrc: Oral  Oral   Resp: 18  19   Height:  5\' 4"  (1.626 m)    Weight:      SpO2: 97%  96% 98%    Intake/Output Summary (Last 24 hours) at 03/09/15 1441 Last data filed at 03/09/15 1044  Gross per 24 hour  Intake 256.75 ml  Output   1075 ml  Net -818.25 ml   Filed Weights   03/08/15 0056  Weight: 61.689 kg (136 lb)    Examination: BP 102/63 mmHg  Pulse 70  Temp(Src) 98.7 F (37.1 C) (Oral)  Resp 19  Ht 5\' 4"  (1.626 m)  Wt 61.689 kg (136 lb)  BMI 23.33 kg/m2  SpO2 98% General exam: appears in pain with breathing  Respiratory system: Clear. No increased work of breathing. No wheezing Cardiovascular system: regular rate and rhythm  Gastrointestinal system: Abdomen is nondistended, soft and nontender. Normal bowel sounds heard. Central nervous system: AxOx3. No focal deficits Extremities: No clubbing/cyanosis Skin: no rashes  Data Reviewed: I have personally reviewed following labs and imaging studies  CBC:  Recent Labs Lab 03/08/15 0120 03/09/15 0549  WBC 9.9 12.9*  NEUTROABS 8.5*  --  HGB 11.1* 10.1*  HCT 36.0 33.8*  MCV 80.5 79.9  PLT 243 0000000   Basic Metabolic Panel:  Recent Labs Lab 03/08/15 0120 03/09/15 0549  NA 134* 134*  K 3.4* 3.8  CL 92* 90*  CO2 29 28  GLUCOSE 118* 112*  BUN 44* 49*  CREATININE 1.98* 2.16*  CALCIUM 9.2 9.4   GFR: Estimated Creatinine Clearance: 17.6 mL/min (by C-G formula based on Cr of 2.16). Liver Function Tests:  Recent Labs Lab 03/08/15 0120  AST 36  ALT 21  ALKPHOS 81  BILITOT 2.0*    PROT 7.7  ALBUMIN 4.0   Coagulation Profile:  Recent Labs Lab 03/04/15 03/08/15 0120 03/09/15 0549  INR 2.3 2.02* 2.36*   Cardiac Enzymes:  Recent Labs Lab 03/08/15 0120 03/08/15 1834 03/09/15 0025 03/09/15 0549  TROPONINI 0.24* 0.41* 0.51* 0.44*   Urine analysis:    Component Value Date/Time   COLORURINE YELLOW 11/02/2014 1632   APPEARANCEUR CLOUDY* 11/02/2014 1632   LABSPEC 1.012 11/02/2014 1632   PHURINE 8.0 11/02/2014 1632   GLUCOSEU NEGATIVE 11/02/2014 1632   HGBUR LARGE* 11/02/2014 1632   BILIRUBINUR NEGATIVE 11/02/2014 1632   BILIRUBINUR n 10/29/2010 1421   KETONESUR NEGATIVE 11/02/2014 1632   PROTEINUR 30* 11/02/2014 1632   PROTEINUR trace 10/29/2010 1421   UROBILINOGEN 1.0 11/02/2014 1632   UROBILINOGEN 0.2 10/29/2010 1421   NITRITE POSITIVE* 11/02/2014 1632   NITRITE n 10/29/2010 1421   LEUKOCYTESUR LARGE* 11/02/2014 1632   Sepsis Labs: Invalid input(s): PROCALCITONIN, LACTICIDVEN  Recent Results (from the past 240 hour(s))  Blood culture (routine x 2)     Status: None (Preliminary result)   Collection Time: 03/08/15  1:20 AM  Result Value Ref Range Status   Specimen Description BLOOD LEFT ARM  Final   Special Requests BOTTLES DRAWN AEROBIC AND ANAEROBIC 5ML  Final   Culture   Final    NO GROWTH 1 DAY Performed at [facility]    Report Status PENDING  Incomplete  Blood culture (routine x 2)     Status: None (Preliminary result)   Collection Time: 03/08/15  1:20 AM  Result Value Ref Range Status   Specimen Description BLOOD LEFT HAND  Final   Special Requests BOTTLES DRAWN AEROBIC AND ANAEROBIC 5ML  Final   Culture   Final    NO GROWTH 1 DAY Performed at [facility]    Report Status PENDING  Incomplete      Radiology Studies: Dg Chest 2 View  03/08/2015  CLINICAL DATA:  Acute onset of shortness of breath and cough. Back pain. Initial encounter. EXAM: CHEST  2 VIEW COMPARISON:  Chest radiograph performed 03/07/2015  FINDINGS: Rapidly increasing right midlung airspace opacity is compatible with pneumonia. No pleural effusion or pneumothorax is seen. The cardiomediastinal silhouette is enlarged. A pacemaker/AICD is noted at the left chest wall, with leads ending at the right atrium, right ventricle and coronary sinus. A right PICC is noted ending at the proximal right atrium. No acute osseous abnormalities are seen. IMPRESSION: 1. Worsening right midlung opacity, compatible with pneumonia. 2. Cardiomegaly. Electronically Signed   By: Garald Balding M.D.   On: 03/08/2015 02:43   Ct Chest Wo Contrast  03/09/2015  CLINICAL DATA:  Hemoptysis. Evaluate for aspiration pneumonia. Cough, fever. EXAM: CT CHEST WITHOUT CONTRAST TECHNIQUE: Multidetector CT imaging of the chest was performed following the standard protocol without IV contrast. COMPARISON:  03/08/2015 FINDINGS: There is a small right pleural effusion. Consolidation noted throughout much of  the right lower lobe, most confluent in the superior segment. This is most compatible with pneumonia. Aspiration pneumonia is possible. Left lung is clear except for minimal atelectasis or scarring at the left base. There is cardiomegaly. Small pericardial effusion. Left ICD device in place. Aortic calcifications without aneurysm. Mild mediastinal adenopathy. Precarinal lymph node has a short axis diameter of 20 mm. Right paratracheal lymph node has a short axis diameter of 10 mm. No visible hilar or axillary adenopathy. Imaging into the upper abdomen shows no acute findings. No acute bony abnormality or focal bone lesion. IMPRESSION: Consolidation throughout much of the right lower lobe, most confluent in the superior segment compatible with pneumonia. Aspiration is not excluded. Mild mediastinal adenopathy which may be reactive. This could be followed after treatment and resolution of acute symptoms, in 3-6 months to assure stability or resolution. Cardiomegaly. Small right pleural  effusion and pericardial effusion. Electronically Signed   By: Rolm Baptise M.D.   On: 03/09/2015 09:41     Scheduled Meds: . ezetimibe  10 mg Oral Q M,W,F  . levalbuterol  0.63 mg Nebulization Q8H  . levothyroxine  100 mcg Oral QHS  . piperacillin-tazobactam (ZOSYN)  IV  2.25 g Intravenous 4 times per day  . potassium chloride SA  10 mEq Oral BID  . potassium chloride  40 mEq Oral Once  . torsemide  20 mg Oral BID  . warfarin  2.5 mg Oral ONCE-1800  . Warfarin - Pharmacist Dosing Inpatient   Does not apply q1800   Continuous Infusions: . DOBUTamine 5 mcg/kg/min (03/08/15 2351)     Marzetta Board, MD, PhD [facility] Pager [phone]  If 7PM-7AM, please contact night-coverage [email or web address] Password [facility] 03/09/2015, 2:41 PM

## 2015-03-09 NOTE — Progress Notes (Signed)
Pharmacist Heart Failure Core Measure Documentation  Assessment: Sandra [name] has an EF documented as 25% on Oct 2015 by ECHO.  Rationale: Heart failure patients with left ventricular systolic dysfunction (LVSD) and an EF < 40% should be prescribed an angiotensin converting enzyme inhibitor (ACEI) or angiotensin receptor blocker (ARB) at discharge unless a contraindication is documented in the medical record.  This patient is not currently on an ACEI or ARB for HF.  This note is being placed in the record in order to provide documentation that a contraindication to the use of these agents is present for this encounter.  ACE Inhibitor or Angiotensin Receptor Blocker is contraindicated (specify all that apply)  []   ACEI allergy AND ARB allergy []   Angioedema []   Moderate or severe aortic stenosis []   Hyperkalemia []   Hypotension []   Renal artery stenosis [x]   Worsening renal function, preexisting renal disease or dysfunction   Quana Chamberlain, Rocky Crafts 03/09/2015 3:44 PM

## 2015-03-10 DIAGNOSIS — J690 Pneumonitis due to inhalation of food and vomit: Secondary | ICD-10-CM | POA: Insufficient documentation

## 2015-03-10 LAB — BASIC METABOLIC PANEL
Anion gap: 16 — ABNORMAL HIGH (ref 5–15)
BUN: 55 mg/dL — AB (ref 6–20)
CALCIUM: 9.4 mg/dL (ref 8.9–10.3)
CHLORIDE: 90 mmol/L — AB (ref 101–111)
CO2: 24 mmol/L (ref 22–32)
CREATININE: 2.36 mg/dL — AB (ref 0.44–1.00)
GFR calc Af Amer: 21 mL/min — ABNORMAL LOW (ref >60)
GFR calc non Af Amer: 18 mL/min — ABNORMAL LOW (ref >60)
Glucose, Bld: 85 mg/dL (ref 65–99)
Potassium: 4.9 mmol/L (ref 3.5–5.1)
SODIUM: 130 mmol/L — AB (ref 135–145)

## 2015-03-10 LAB — CBC
HCT: 32 % — ABNORMAL LOW (ref 36.0–46.0)
Hemoglobin: 10 g/dL — ABNORMAL LOW (ref 12.0–15.0)
MCH: 24.3 pg — ABNORMAL LOW (ref 26.0–34.0)
MCHC: 31.3 g/dL (ref 30.0–36.0)
MCV: 77.9 fL — AB (ref 78.0–100.0)
PLATELETS: 193 10*3/uL (ref 150–400)
RBC: 4.11 MIL/uL (ref 3.87–5.11)
RDW: 18.3 % — AB (ref 11.5–15.5)
WBC: 13.2 10*3/uL — AB (ref 4.0–10.5)

## 2015-03-10 LAB — PROTIME-INR
INR: 3.27 — AB (ref 0.00–1.49)
PROTHROMBIN TIME: 32.7 s — AB (ref 11.6–15.2)

## 2015-03-10 NOTE — Progress Notes (Signed)
ANTICOAGULATION CONSULT NOTE - Follow Up Consult  Pharmacy Consult for Coumadin Indication: atrial fibrillation  Allergies  Allergen Reactions  . Levofloxacin Other (See Comments)    Due to cardiac  AF  . Procaine Hcl Other (See Comments)    REACTION: smothers  . Spironolactone Diarrhea    Abdominal pain  . Amiodarone Nausea And Vomiting  . Tape Other (See Comments)    Tears skin off.  Please use "paper" tape only  . Amoxicillin Other (See Comments)    Rash   . Codeine Other (See Comments)    REACTION: nausea  . Niacin Other (See Comments)    REACTION: rash  . Ramipril Other (See Comments)    unknown  . Statins Other (See Comments)    REACTION: muscle aches    Patient Measurements: Height: 5\' 4"  (162.6 cm) Weight: 136 lb 6.4 oz (61.871 kg) IBW/kg (Calculated) : 54.7  Vital Signs: Temp: 98.9 F (37.2 C) (02/26 0500) BP: 117/74 mmHg (02/26 0500) Pulse Rate: 73 (02/26 0500)  Labs:  Recent Labs  03/08/15 0120 03/08/15 1834 03/09/15 0025 03/09/15 0549 03/10/15 0721  HGB 11.1*  --   --  10.1* 10.0*  HCT 36.0  --   --  33.8* 32.0*  PLT 243  --   --  193 193  LABPROT 22.7*  --   --  25.5* 32.7*  INR 2.02*  --   --  2.36* 3.27*  CREATININE 1.98*  --   --  2.16* 2.36*  TROPONINI 0.24* 0.41* 0.51* 0.44*  --     Estimated Creatinine Clearance: 16.1 mL/min (by C-G formula based on Cr of 2.36).   Medications:  Prescriptions prior to admission  Medication Sig Dispense Refill Last Dose  . acetaminophen (TYLENOL) 325 MG tablet Take 650 mg by mouth every 6 (six) hours as needed for mild pain or headache. Reported on 03/07/2015   PRN  . ezetimibe (ZETIA) 10 MG tablet Take 10 mg by mouth every Monday, Wednesday, and Friday.    03/08/2015 at Unknown time  . fluticasone (FLONASE) 50 MCG/ACT nasal spray Place 2 sprays into both nostrils daily. 16 g 6 03/07/2015 at Unknown time  . levothyroxine (SYNTHROID, LEVOTHROID) 100 MCG tablet Take 100 mcg by mouth at bedtime.    03/07/2015 at Unknown time  . potassium chloride SA (K-DUR,KLOR-CON) 10 MEQ tablet Take 1 tablet (10 mEq total) by mouth 2 (two) times daily.   03/08/2015 at Unknown time  . torsemide (DEMADEX) 20 MG tablet Take 2 tablets (40 mg total) by mouth 2 (two) times daily. 120 tablet 6 03/08/2015 at Unknown time  . triamcinolone cream (KENALOG) 0.1 % Apply 1 application topically 2 (two) times daily as needed (itching/rash).   PRN  . VITAMIN E PO Take 1 tablet by mouth daily.   03/08/2015 at Unknown time  . warfarin (COUMADIN) 5 MG tablet Take 2.5-5 mg by mouth as directed. Take 2.5 mg (1/2 tablet) daily except 5 mg (1 tablet) on Monday   03/07/2015 at Unknown time  . clindamycin (CLEOCIN) 150 MG capsule Take 3 capsules (450 mg total) by mouth 3 (three) times daily. 63 capsule 0   . DOBUTamine (DOBUTREX) 4-5 MG/ML-% infusion Inject 330 mcg/min into the vein continuous. 250 mL 6 Taking    Assessment: 82 yo F on Coumadin PTA for hx afib.  INR therapeutic on admission.  Now INR has increased almost a full point - likely related to acute illness and diet changes.    Goal of  Therapy:  INR 2-3 Monitor platelets by anticoagulation protocol: Yes   Plan:  No Coumadin tonight Daily INR  [employer], Pharm.D., BCPS Clinical Pharmacist Pager [phone] 03/10/2015 9:37 AM

## 2015-03-10 NOTE — Progress Notes (Addendum)
Advanced Heart Failure Rounding Note   Subjective:    CP improving. No hemoptysis.    Objective:   Weight Range:  Vital Signs:   Temp:  [98.2 F (36.8 C)-98.9 F (37.2 C)] 98.9 F (37.2 C) (02/26 0500) Pulse Rate:  [71-73] 73 (02/26 0500) Resp:  [19-20] 19 (02/26 0500) BP: (117-126)/(67-74) 117/74 mmHg (02/26 0500) SpO2:  [89 %-100 %] 100 % (02/26 0500) Weight:  [61.871 kg (136 lb 6.4 oz)] 61.871 kg (136 lb 6.4 oz) (02/26 0500) Last BM Date: 03/09/15  Weight change: Filed Weights   03/08/15 0056 03/10/15 0500  Weight: 61.689 kg (136 lb) 61.871 kg (136 lb 6.4 oz)    Intake/Output:   Intake/Output Summary (Last 24 hours) at 03/10/15 0829 Last data filed at 03/10/15 0600  Gross per 24 hour  Intake    875 ml  Output   1075 ml  Net   -200 ml     Physical Exam: General: weak appearing. NAD  HEENT: normal  Neck: supple. JVP 6. Carotids 2+ bilat; no bruits. No thyromegaly or nodule noted.  Cor: PMI laterally displaced. regular  Lungs: Crackles throughout RLL  Abdomen: soft, NT, ND, no HSM. No bruits or masses. +BS  Extremities: no cyanosis, clubbing, rash, no edema  Neuro: alert & oriented x 3, cranial nerves grossly intact. moves all 4 extremities w/o difficulty. Affect pleasant.  Skin: no rash.  Telemetry: AF with v-pacing  Labs: Basic Metabolic Panel:  Recent Labs Lab 03/08/15 0120 03/09/15 0549  NA 134* 134*  K 3.4* 3.8  CL 92* 90*  CO2 29 28  GLUCOSE 118* 112*  BUN 44* 49*  CREATININE 1.98* 2.16*  CALCIUM 9.2 9.4    Liver Function Tests:  Recent Labs Lab 03/08/15 0120  AST 36  ALT 21  ALKPHOS 81  BILITOT 2.0*  PROT 7.7  ALBUMIN 4.0   No results for input(s): LIPASE, AMYLASE in the last 168 hours. No results for input(s): AMMONIA in the last 168 hours.  CBC:  Recent Labs Lab 03/08/15 0120 03/09/15 0549 03/10/15 0721  WBC 9.9 12.9* 13.2*  NEUTROABS 8.5*  --   --   HGB 11.1* 10.1* 10.0*  HCT 36.0 33.8* 32.0*  MCV 80.5 79.9  77.9*  PLT 243 193 193    Cardiac Enzymes:  Recent Labs Lab 03/08/15 0120 03/08/15 1834 03/09/15 0025 03/09/15 0549  TROPONINI 0.24* 0.41* 0.51* 0.44*    BNP: BNP (last 3 results)  Recent Labs  02/25/15 1115 03/08/15 0120  BNP 1364.1* 1414.9*    ProBNP (last 3 results) No results for input(s): PROBNP in the last 8760 hours.    Other results:  Imaging: Ct Chest Wo Contrast  03/09/2015  CLINICAL DATA:  Hemoptysis. Evaluate for aspiration pneumonia. Cough, fever. EXAM: CT CHEST WITHOUT CONTRAST TECHNIQUE: Multidetector CT imaging of the chest was performed following the standard protocol without IV contrast. COMPARISON:  03/08/2015 FINDINGS: There is a small right pleural effusion. Consolidation noted throughout much of the right lower lobe, most confluent in the superior segment. This is most compatible with pneumonia. Aspiration pneumonia is possible. Left lung is clear except for minimal atelectasis or scarring at the left base. There is cardiomegaly. Small pericardial effusion. Left ICD device in place. Aortic calcifications without aneurysm. Mild mediastinal adenopathy. Precarinal lymph node has a short axis diameter of 20 mm. Right paratracheal lymph node has a short axis diameter of 10 mm. No visible hilar or axillary adenopathy. Imaging into the upper abdomen shows  no acute findings. No acute bony abnormality or focal bone lesion. IMPRESSION: Consolidation throughout much of the right lower lobe, most confluent in the superior segment compatible with pneumonia. Aspiration is not excluded. Mild mediastinal adenopathy which may be reactive. This could be followed after treatment and resolution of acute symptoms, in 3-6 months to assure stability or resolution. Cardiomegaly. Small right pleural effusion and pericardial effusion. Electronically Signed   By: Rolm Baptise M.D.   On: 03/09/2015 09:41      Medications:     Scheduled Medications: . ezetimibe  10 mg Oral Q M,W,F   . levothyroxine  100 mcg Oral QHS  . piperacillin-tazobactam (ZOSYN)  IV  2.25 g Intravenous 4 times per day  . potassium chloride SA  10 mEq Oral BID  . potassium chloride  40 mEq Oral Once  . torsemide  20 mg Oral BID  . Warfarin - Pharmacist Dosing Inpatient   Does not apply q1800     Infusions: . DOBUTamine 5 mcg/kg/min (03/08/15 2351)     PRN Medications:  acetaminophen, levalbuterol, ondansetron **OR** ondansetron (ZOFRAN) IV, traMADol   Assessment:   1. Acute respiratory failure  2. Aspiration PNA - RLL on CT 3. Chronic systolic HF on home dobutamine 4. Chronic AF -s/p AV node ablation. Failed amio and Tikosyn.  5. Acute on CKD stage IV 1.9->2.2  Plan/Discussion:     She has severe aspiration PNA. Currently on abx per primary team. D/w Dr. Renne Crigler  HF stable. She appears a bit dry. Torsemide cut back. Can stop completely as needed. BMET pending. Would continue dobutamine.   She has chronic AF. Now s/p AVN ablation and BiV pacing. Remains on coumadin   Minimally elevated troponin likely due to demand ischemia. No ischemic w/u needed at this point.   If PNA not improving can consider Pulmonary consult for bronch to look for retained foreign matter though CT    Length of Stay: 2   Bensimhon, Daniel MD 03/10/2015, 8:29 AM  Advanced Heart Failure Team Pager [phone] (M-F; 7a - 4p)  Please contact [facility] for night-coverage after hours (4p -7a ) and weekends on [email or web address]

## 2015-03-10 NOTE — Progress Notes (Signed)
PROGRESS NOTE  VICTORIAANN EICHELBERGER [id number] DOB: Oct 19, 1932 DOA: 03/08/2015 PCP: Garret Reddish, MD Outpatient Specialists:    LOS: 2 days   Brief Narrative: is a 82 y.o. female with past history of chronic systolic CHF/NICM with EF of 25%, low output state maintained on chronic dobutamine at home , AICD, paroxysmal A. fib on warfarin, hypothyroidism history of AV node ablation on 02/06/15, admitted on 2/24 with shortness of breath and pleuritic chest pain after episode of aspiration at home several days prior.  Assessment & Plan: Principal Problem:   Aspiration pneumonia ([place]) Active Problems:   Hypothyroidism   Chronic kidney disease, stage III (moderate)   Chronic systolic heart failure (HCC)   Persistent atrial fibrillation (HCC)   Lobar pneumonia (HCC)   Acute on chronic renal failure (HCC)   Aspiration pneumonia  - Following choking incident a few days ago - Will cover with IV Zosyn, remote history of rash with amoxicillin but daughter reports tolerating it again after initial rash. Tolerating well in house - Follow-up blood cultures - SLP evaluation without signs of aspiration - CT chest 2/25 with RLL consolidation compatible with pneumonia. Discussed and reviewed the CT scan today with Dr. Melvyn Novas, no apparent airway compromise and no role for bronchoscopy. Hemorrhage can cave similar appearance on CT but lack of significant hemoptysis and Hb drop makes this less likely.  Chronic systolic CHF/ low output state on chronic dobutamine - EF is 25%, Clinically compensated - Continue dobutamine per heart failure team - Continue torsemide   Elevated Troponin - no symptoms of ACS - suspect due to demand/pneumonia/CKD - no CAD based on cards notes  Persistent atrial fibrillation - Continue warfarin per pharmacy  Hypothyroidism - Continue Synthroid  Chronic kidney disease, stage IV - Creatinine stable at baseline, slight worsening over the past 2 days, continue to closely  monitor - will hold torsemide today given rise in Cr   DVT prophylaxis: Coumadin Code Status: Full Family Communication: d/w daughter Vicky bedside Disposition Plan: home when ready  Barriers for discharge: IV antibiotics  Consultants:   Cardiology   Procedures:   None   Antimicrobials:  Zosyn 2/24 >>   Subjective: - ongoing pleuritic right sided chest pain, feels like this is improving - ambulated some in the room  Objective: Filed Vitals:   03/09/15 1016 03/09/15 2012 03/09/15 2100 03/10/15 0500  BP:   126/67 117/74  Pulse:   71 73  Temp:   98.2 F (36.8 C) 98.9 F (37.2 C)  TempSrc:      Resp:   20 19  Height:      Weight:    61.871 kg (136 lb 6.4 oz)  SpO2: 98% 89% 99% 100%    Intake/Output Summary (Last 24 hours) at 03/10/15 1305 Last data filed at 03/10/15 0600  Gross per 24 hour  Intake    875 ml  Output    675 ml  Net    200 ml   Filed Weights   03/08/15 0056 03/10/15 0500  Weight: 61.689 kg (136 lb) 61.871 kg (136 lb 6.4 oz)    Examination: BP 117/74 mmHg  Pulse 73  Temp(Src) 98.9 F (37.2 C) (Oral)  Resp 19  Ht 5\' 4"  (1.626 m)  Wt 61.871 kg (136 lb 6.4 oz)  BMI 23.40 kg/m2  SpO2 100% General exam: appears in pain with breathing  Respiratory system: Clear. No increased work of breathing. No wheezing Cardiovascular system: regular rate and rhythm  Gastrointestinal system: Abdomen is nondistended,  soft and nontender. Normal bowel sounds heard. Central nervous system: AxOx3. No focal deficits Extremities: No clubbing/cyanosis Skin: no rashes  Data Reviewed: I have personally reviewed following labs and imaging studies  CBC:  Recent Labs Lab 03/08/15 0120 03/09/15 0549 03/10/15 0721  WBC 9.9 12.9* 13.2*  NEUTROABS 8.5*  --   --   HGB 11.1* 10.1* 10.0*  HCT 36.0 33.8* 32.0*  MCV 80.5 79.9 77.9*  PLT 243 193 0000000   Basic Metabolic Panel:  Recent Labs Lab 03/08/15 0120 03/09/15 0549 03/10/15 0721  NA 134* 134* 130*  K  3.4* 3.8 4.9  CL 92* 90* 90*  CO2 29 28 24   GLUCOSE 118* 112* 85  BUN 44* 49* 55*  CREATININE 1.98* 2.16* 2.36*  CALCIUM 9.2 9.4 9.4   GFR: Estimated Creatinine Clearance: 16.1 mL/min (by C-G formula based on Cr of 2.36). Liver Function Tests:  Recent Labs Lab 03/08/15 0120  AST 36  ALT 21  ALKPHOS 81  BILITOT 2.0*  PROT 7.7  ALBUMIN 4.0   Coagulation Profile:  Recent Labs Lab 03/04/15 03/08/15 0120 03/09/15 0549 03/10/15 0721  INR 2.3 2.02* 2.36* 3.27*   Cardiac Enzymes:  Recent Labs Lab 03/08/15 0120 03/08/15 1834 03/09/15 0025 03/09/15 0549  TROPONINI 0.24* 0.41* 0.51* 0.44*   Urine analysis:    Component Value Date/Time   COLORURINE YELLOW 11/02/2014 1632   APPEARANCEUR CLOUDY* 11/02/2014 1632   LABSPEC 1.012 11/02/2014 1632   PHURINE 8.0 11/02/2014 1632   GLUCOSEU NEGATIVE 11/02/2014 1632   HGBUR LARGE* 11/02/2014 1632   BILIRUBINUR NEGATIVE 11/02/2014 1632   BILIRUBINUR n 10/29/2010 1421   KETONESUR NEGATIVE 11/02/2014 1632   PROTEINUR 30* 11/02/2014 1632   PROTEINUR trace 10/29/2010 1421   UROBILINOGEN 1.0 11/02/2014 1632   UROBILINOGEN 0.2 10/29/2010 1421   NITRITE POSITIVE* 11/02/2014 1632   NITRITE n 10/29/2010 1421   LEUKOCYTESUR LARGE* 11/02/2014 1632   Sepsis Labs: Invalid input(s): PROCALCITONIN, LACTICIDVEN  Recent Results (from the past 240 hour(s))  Blood culture (routine x 2)     Status: None (Preliminary result)   Collection Time: 03/08/15  1:20 AM  Result Value Ref Range Status   Specimen Description BLOOD LEFT ARM  Final   Special Requests BOTTLES DRAWN AEROBIC AND ANAEROBIC 5ML  Final   Culture   Final    NO GROWTH 1 DAY Performed at [facility]    Report Status PENDING  Incomplete  Blood culture (routine x 2)     Status: None (Preliminary result)   Collection Time: 03/08/15  1:20 AM  Result Value Ref Range Status   Specimen Description BLOOD LEFT HAND  Final   Special Requests BOTTLES DRAWN AEROBIC AND  ANAEROBIC 5ML  Final   Culture   Final    NO GROWTH 1 DAY Performed at [facility]    Report Status PENDING  Incomplete      Radiology Studies: Ct Chest Wo Contrast  03/09/2015  CLINICAL DATA:  Hemoptysis. Evaluate for aspiration pneumonia. Cough, fever. EXAM: CT CHEST WITHOUT CONTRAST TECHNIQUE: Multidetector CT imaging of the chest was performed following the standard protocol without IV contrast. COMPARISON:  03/08/2015 FINDINGS: There is a small right pleural effusion. Consolidation noted throughout much of the right lower lobe, most confluent in the superior segment. This is most compatible with pneumonia. Aspiration pneumonia is possible. Left lung is clear except for minimal atelectasis or scarring at the left base. There is cardiomegaly. Small pericardial effusion. Left ICD device in place. Aortic  calcifications without aneurysm. Mild mediastinal adenopathy. Precarinal lymph node has a short axis diameter of 20 mm. Right paratracheal lymph node has a short axis diameter of 10 mm. No visible hilar or axillary adenopathy. Imaging into the upper abdomen shows no acute findings. No acute bony abnormality or focal bone lesion. IMPRESSION: Consolidation throughout much of the right lower lobe, most confluent in the superior segment compatible with pneumonia. Aspiration is not excluded. Mild mediastinal adenopathy which may be reactive. This could be followed after treatment and resolution of acute symptoms, in 3-6 months to assure stability or resolution. Cardiomegaly. Small right pleural effusion and pericardial effusion. Electronically Signed   By: Rolm Baptise M.D.   On: 03/09/2015 09:41     Scheduled Meds: . ezetimibe  10 mg Oral Q M,W,F  . levothyroxine  100 mcg Oral QHS  . piperacillin-tazobactam (ZOSYN)  IV  2.25 g Intravenous 4 times per day  . potassium chloride SA  10 mEq Oral BID  . potassium chloride  40 mEq Oral Once  . torsemide  20 mg Oral BID  . Warfarin - Pharmacist  Dosing Inpatient   Does not apply q1800   Continuous Infusions: . DOBUTamine 5 mcg/kg/min (03/08/15 2351)     Marzetta Board, MD, PhD [facility] Pager [phone]  If 7PM-7AM, please contact night-coverage [email or web address] Password [email or web address] 03/10/2015, 1:05 PM

## 2015-03-10 NOTE — Progress Notes (Signed)
[facility]  Patient Status: Active pt with [facility] prior to this readmission    [facility] is providing the following services: HHRN and Home Inotrope Pharmacy team for home inotrope therapy and HF Management.  [facility] hospital team will follow Mr. Speiser while an inpatient to support team with DC to home.   If patient discharges after hours, please call [phone].   Larry Sierras 03/10/2015, 3:44 PM

## 2015-03-11 DIAGNOSIS — I5023 Acute on chronic systolic (congestive) heart failure: Secondary | ICD-10-CM

## 2015-03-11 DIAGNOSIS — N183 Chronic kidney disease, stage 3 (moderate): Secondary | ICD-10-CM

## 2015-03-11 DIAGNOSIS — T17998A Other foreign object in respiratory tract, part unspecified causing other injury, initial encounter: Secondary | ICD-10-CM

## 2015-03-11 DIAGNOSIS — I5022 Chronic systolic (congestive) heart failure: Secondary | ICD-10-CM

## 2015-03-11 DIAGNOSIS — N179 Acute kidney failure, unspecified: Secondary | ICD-10-CM

## 2015-03-11 DIAGNOSIS — N189 Chronic kidney disease, unspecified: Secondary | ICD-10-CM

## 2015-03-11 DIAGNOSIS — T17908A Unspecified foreign body in respiratory tract, part unspecified causing other injury, initial encounter: Secondary | ICD-10-CM | POA: Insufficient documentation

## 2015-03-11 DIAGNOSIS — J690 Pneumonitis due to inhalation of food and vomit: Principal | ICD-10-CM

## 2015-03-11 LAB — CBC
HCT: 30.4 % — ABNORMAL LOW (ref 36.0–46.0)
Hemoglobin: 9.3 g/dL — ABNORMAL LOW (ref 12.0–15.0)
MCH: 24.1 pg — ABNORMAL LOW (ref 26.0–34.0)
MCHC: 30.6 g/dL (ref 30.0–36.0)
MCV: 78.8 fL (ref 78.0–100.0)
Platelets: 195 10*3/uL (ref 150–400)
RBC: 3.86 MIL/uL — ABNORMAL LOW (ref 3.87–5.11)
RDW: 18.1 % — AB (ref 11.5–15.5)
WBC: 9 10*3/uL (ref 4.0–10.5)

## 2015-03-11 LAB — BASIC METABOLIC PANEL
ANION GAP: 14 (ref 5–15)
BUN: 53 mg/dL — AB (ref 6–20)
CALCIUM: 9 mg/dL (ref 8.9–10.3)
CO2: 28 mmol/L (ref 22–32)
Chloride: 90 mmol/L — ABNORMAL LOW (ref 101–111)
Creatinine, Ser: 2.36 mg/dL — ABNORMAL HIGH (ref 0.44–1.00)
GFR calc Af Amer: 21 mL/min — ABNORMAL LOW (ref >60)
GFR, EST NON AFRICAN AMERICAN: 18 mL/min — AB (ref >60)
GLUCOSE: 88 mg/dL (ref 65–99)
POTASSIUM: 3.7 mmol/L (ref 3.5–5.1)
SODIUM: 132 mmol/L — AB (ref 135–145)

## 2015-03-11 LAB — PROTIME-INR
INR: 3.99 — AB (ref 0.00–1.49)
Prothrombin Time: 37.9 seconds — ABNORMAL HIGH (ref 11.6–15.2)

## 2015-03-11 MED ORDER — DEXTROMETHORPHAN POLISTIREX ER 30 MG/5ML PO SUER
30.0000 mg | Freq: Two times a day (BID) | ORAL | Status: DC
  Administered 2015-03-11 – 2015-03-15 (×9): 30 mg via ORAL
  Filled 2015-03-11 (×12): qty 5

## 2015-03-11 MED ORDER — PANTOPRAZOLE SODIUM 40 MG PO TBEC
40.0000 mg | DELAYED_RELEASE_TABLET | Freq: Every day | ORAL | Status: DC
  Administered 2015-03-11 – 2015-03-15 (×5): 40 mg via ORAL
  Filled 2015-03-11 (×5): qty 1

## 2015-03-11 MED ORDER — POTASSIUM CHLORIDE CRYS ER 20 MEQ PO TBCR
20.0000 meq | EXTENDED_RELEASE_TABLET | Freq: Two times a day (BID) | ORAL | Status: DC
  Administered 2015-03-11: 20 meq via ORAL
  Filled 2015-03-11: qty 1

## 2015-03-11 MED ORDER — FUROSEMIDE 10 MG/ML IJ SOLN
40.0000 mg | Freq: Two times a day (BID) | INTRAMUSCULAR | Status: DC
  Administered 2015-03-11 – 2015-03-12 (×3): 40 mg via INTRAVENOUS
  Filled 2015-03-11 (×3): qty 4

## 2015-03-11 MED ORDER — SIMETHICONE 80 MG PO CHEW
80.0000 mg | CHEWABLE_TABLET | Freq: Four times a day (QID) | ORAL | Status: DC | PRN
  Administered 2015-03-11: 80 mg via ORAL
  Filled 2015-03-11: qty 1

## 2015-03-11 MED ORDER — DEXTROSE 5 % IV SOLN
1.0000 g | INTRAVENOUS | Status: DC
  Administered 2015-03-11 – 2015-03-15 (×5): 1 g via INTRAVENOUS
  Filled 2015-03-11 (×6): qty 10

## 2015-03-11 NOTE — Care Management Important Message (Signed)
Important Message  Patient Details  Name: Sandra [name] MRN: [id number] Date of Birth: Dec 21, 1932   Medicare Important Message Given:  Yes    [name] 03/11/2015, 11:01 AM

## 2015-03-11 NOTE — Progress Notes (Signed)
ANTICOAGULATION CONSULT NOTE - Follow Up Consult  Pharmacy Consult for Coumadin Indication: atrial fibrillation  Allergies  Allergen Reactions  . Levofloxacin Other (See Comments)    Due to cardiac  AF  . Procaine Hcl Other (See Comments)    REACTION: smothers  . Spironolactone Diarrhea    Abdominal pain  . Amiodarone Nausea And Vomiting  . Tape Other (See Comments)    Tears skin off.  Please use "paper" tape only  . Amoxicillin Other (See Comments)    Rash   . Codeine Other (See Comments)    REACTION: nausea  . Niacin Other (See Comments)    REACTION: rash  . Ramipril Other (See Comments)    unknown  . Statins Other (See Comments)    REACTION: muscle aches    Patient Measurements: Height: 5\' 4"  (162.6 cm) Weight: 148 lb 11.2 oz (67.45 kg) IBW/kg (Calculated) : 54.7  Vital Signs: Temp: 97.6 F (36.4 C) (02/27 0922) Temp Source: Oral (02/27 0922) BP: 118/73 mmHg (02/27 0500) Pulse Rate: 68 (02/27 0500)  Labs:  Recent Labs  03/08/15 1834 03/09/15 0025  03/09/15 0549 03/10/15 0721 03/11/15 0449  HGB  --   --   < > 10.1* 10.0* 9.3*  HCT  --   --   --  33.8* 32.0* 30.4*  PLT  --   --   --  193 193 195  LABPROT  --   --   --  25.5* 32.7* 37.9*  INR  --   --   --  2.36* 3.27* 3.99*  CREATININE  --   --   --  2.16* 2.36* 2.36*  TROPONINI 0.41* 0.51*  --  0.44*  --   --   < > = values in this interval not displayed.  Estimated Creatinine Clearance: 17.6 mL/min (by C-G formula based on Cr of 2.36).   Medications:  Prescriptions prior to admission  Medication Sig Dispense Refill Last Dose  . acetaminophen (TYLENOL) 325 MG tablet Take 650 mg by mouth every 6 (six) hours as needed for mild pain or headache. Reported on 03/07/2015   PRN  . ezetimibe (ZETIA) 10 MG tablet Take 10 mg by mouth every Monday, Wednesday, and Friday.    03/08/2015 at Unknown time  . fluticasone (FLONASE) 50 MCG/ACT nasal spray Place 2 sprays into both nostrils daily. 16 g 6 03/07/2015 at  Unknown time  . levothyroxine (SYNTHROID, LEVOTHROID) 100 MCG tablet Take 100 mcg by mouth at bedtime.   03/07/2015 at Unknown time  . potassium chloride SA (K-DUR,KLOR-CON) 10 MEQ tablet Take 1 tablet (10 mEq total) by mouth 2 (two) times daily.   03/08/2015 at Unknown time  . torsemide (DEMADEX) 20 MG tablet Take 2 tablets (40 mg total) by mouth 2 (two) times daily. 120 tablet 6 03/08/2015 at Unknown time  . triamcinolone cream (KENALOG) 0.1 % Apply 1 application topically 2 (two) times daily as needed (itching/rash).   PRN  . VITAMIN E PO Take 1 tablet by mouth daily.   03/08/2015 at Unknown time  . warfarin (COUMADIN) 5 MG tablet Take 2.5-5 mg by mouth as directed. Take 2.5 mg (1/2 tablet) daily except 5 mg (1 tablet) on Monday   03/07/2015 at Unknown time  . clindamycin (CLEOCIN) 150 MG capsule Take 3 capsules (450 mg total) by mouth 3 (three) times daily. 63 capsule 0   . DOBUTamine (DOBUTREX) 4-5 MG/ML-% infusion Inject 330 mcg/min into the vein continuous. 250 mL 6 Taking    Assessment:  82 yo F on Coumadin PTA for hx afib.  INR 3.99  supratherapeutic with low doses of warfarin - likely related to acute illness and diet changes. LFTs wnl,  CBC remains stable, no bleeding.    Goal of Therapy:  INR 2-3 Monitor platelets by anticoagulation protocol: Yes   Plan:  No Coumadin tonight Daily INR  Bonnita Nasuti Pharm.D. CPP, BCPS Clinical Pharmacist [phone] 03/11/2015 9:47 AM

## 2015-03-11 NOTE — Progress Notes (Signed)
PROGRESS NOTE  Sandra [name] [id number] DOB: 1932-12-01 DOA: 03/08/2015 PCP: Garret Reddish, MD Outpatient Specialists:    LOS: 3 days   Brief Narrative: is a 82 y.o. female with past history of chronic systolic CHF/NICM with EF of 25%, low output state maintained on chronic dobutamine at home , AICD, paroxysmal A. fib on warfarin, hypothyroidism history of AV node ablation on 02/06/15, admitted on 2/24 with shortness of breath and pleuritic chest pain after episode of aspiration at home several days prior.  Assessment & Plan: Principal Problem:   Aspiration pneumonia ([place]) Active Problems:   Hypothyroidism   Chronic kidney disease, stage III (moderate)   Chronic systolic heart failure (HCC)   Persistent atrial fibrillation (HCC)   Lobar pneumonia (HCC)   Acute on chronic renal failure (HCC)   Aspiration pneumonia of right middle lobe (HCC)   Aspiration pneumonia  - Following choking incident a few days ago - Will cover with IV Zosyn, remote history of rash with amoxicillin but daughter reports tolerating it again after initial rash. Tolerating well in house - Follow-up blood cultures - SLP evaluation without signs of aspiration - CT chest 2/25 with RLL consolidation compatible with pneumonia.  - Patient with ongoing hemoptysis overnight, I have consulted pulmonology today, discussed with Dr. Titus Mould, appreciate input  Chronic systolic CHF/ low output state on chronic dobutamine - EF is 25%, Clinically compensated - Continue dobutamine per heart failure team - Continue torsemide  - Discussed with Dr. Haroldine Laws today   Elevated Troponin - no symptoms of ACS - suspect due to demand/pneumonia/CKD - no CAD based on cards notes  Persistent atrial fibrillation - Hold Coumadin tonight given elevated INR Discussed with pharmacy.  Hypothyroidism - Continue Synthroid  Chronic kidney disease, stage IV - Creatinine stable at baseline, slight worsening over the past 2  days, continue to closely monitor - Diuretics per heart failure team   DVT prophylaxis: Coumadin Code Status: Full Family Communication: No family at bedside Disposition Plan: home when ready  Barriers for discharge: IV antibiotics  Consultants:   Cardiology   Pulmonary   Procedures:   None   Antimicrobials:  Zosyn 2/24 >>   Subjective: - Ongoing right-sided chest pain, pleuritic in nature - Endorses hemoptysis overnight, initially dark and then bright red  Objective: Filed Vitals:   03/10/15 0500 03/10/15 2100 03/11/15 0500 03/11/15 0922  BP: 117/74 124/76 118/73   Pulse: 73 72 68   Temp: 98.9 F (37.2 C) 98.1 F (36.7 C) 97.7 F (36.5 C) 97.6 F (36.4 C)  TempSrc:    Oral  Resp: 19 24 26    Height:      Weight: 61.871 kg (136 lb 6.4 oz)  67.45 kg (148 lb 11.2 oz)   SpO2: 100% 100% 100%     Intake/Output Summary (Last 24 hours) at 03/11/15 1319 Last data filed at 03/11/15 0700  Gross per 24 hour  Intake    510 ml  Output    300 ml  Net    210 ml   Filed Weights   03/08/15 0056 03/10/15 0500 03/11/15 0500  Weight: 61.689 kg (136 lb) 61.871 kg (136 lb 6.4 oz) 67.45 kg (148 lb 11.2 oz)    Examination: BP 118/73 mmHg  Pulse 68  Temp(Src) 97.6 F (36.4 C) (Oral)  Resp 26  Ht 5\' 4"  (1.626 m)  Wt 67.45 kg (148 lb 11.2 oz)  BMI 25.51 kg/m2  SpO2 100% General exam: appears in pain with breathing  Respiratory system:  Clear. No increased work of breathing. No wheezing Cardiovascular system: regular rate and rhythm  Gastrointestinal system: Abdomen is nondistended, soft and nontender. Normal bowel sounds heard. Central nervous system: AxOx3. No focal deficits Extremities: No clubbing/cyanosis Skin: no rashes  Data Reviewed: I have personally reviewed following labs and imaging studies  CBC:  Recent Labs Lab 03/08/15 0120 03/09/15 0549 03/10/15 0721 03/11/15 0449  WBC 9.9 12.9* 13.2* 9.0  NEUTROABS 8.5*  --   --   --   HGB 11.1* 10.1* 10.0*  9.3*  HCT 36.0 33.8* 32.0* 30.4*  MCV 80.5 79.9 77.9* 78.8  PLT 243 193 193 0000000   Basic Metabolic Panel:  Recent Labs Lab 03/08/15 0120 03/09/15 0549 03/10/15 0721 03/11/15 0449  NA 134* 134* 130* 132*  K 3.4* 3.8 4.9 3.7  CL 92* 90* 90* 90*  CO2 29 28 24 28   GLUCOSE 118* 112* 85 88  BUN 44* 49* 55* 53*  CREATININE 1.98* 2.16* 2.36* 2.36*  CALCIUM 9.2 9.4 9.4 9.0   GFR: Estimated Creatinine Clearance: 17.6 mL/min (by C-G formula based on Cr of 2.36). Liver Function Tests:  Recent Labs Lab 03/08/15 0120  AST 36  ALT 21  ALKPHOS 81  BILITOT 2.0*  PROT 7.7  ALBUMIN 4.0   Coagulation Profile:  Recent Labs Lab 03/08/15 0120 03/09/15 0549 03/10/15 0721 03/11/15 0449  INR 2.02* 2.36* 3.27* 3.99*   Cardiac Enzymes:  Recent Labs Lab 03/08/15 0120 03/08/15 1834 03/09/15 0025 03/09/15 0549  TROPONINI 0.24* 0.41* 0.51* 0.44*   Urine analysis:    Component Value Date/Time   COLORURINE YELLOW 11/02/2014 1632   APPEARANCEUR CLOUDY* 11/02/2014 1632   LABSPEC 1.012 11/02/2014 1632   PHURINE 8.0 11/02/2014 1632   GLUCOSEU NEGATIVE 11/02/2014 1632   HGBUR LARGE* 11/02/2014 1632   BILIRUBINUR NEGATIVE 11/02/2014 1632   BILIRUBINUR n 10/29/2010 1421   KETONESUR NEGATIVE 11/02/2014 1632   PROTEINUR 30* 11/02/2014 1632   PROTEINUR trace 10/29/2010 1421   UROBILINOGEN 1.0 11/02/2014 1632   UROBILINOGEN 0.2 10/29/2010 1421   NITRITE POSITIVE* 11/02/2014 1632   NITRITE n 10/29/2010 1421   LEUKOCYTESUR LARGE* 11/02/2014 1632   Sepsis Labs: Invalid input(s): PROCALCITONIN, LACTICIDVEN  Recent Results (from the past 240 hour(s))  Blood culture (routine x 2)     Status: None (Preliminary result)   Collection Time: 03/08/15  1:20 AM  Result Value Ref Range Status   Specimen Description BLOOD LEFT ARM  Final   Special Requests BOTTLES DRAWN AEROBIC AND ANAEROBIC 5ML  Final   Culture   Final    NO GROWTH 2 DAYS Performed at [facility]    Report Status  PENDING  Incomplete  Blood culture (routine x 2)     Status: None (Preliminary result)   Collection Time: 03/08/15  1:20 AM  Result Value Ref Range Status   Specimen Description BLOOD LEFT HAND  Final   Special Requests BOTTLES DRAWN AEROBIC AND ANAEROBIC 5ML  Final   Culture   Final    NO GROWTH 2 DAYS Performed at [facility]    Report Status PENDING  Incomplete      Radiology Studies: No results found.   Scheduled Meds: . cefTRIAXone (ROCEPHIN)  IV  1 g Intravenous Q24H  . dextromethorphan  30 mg Oral BID  . ezetimibe  10 mg Oral Q M,W,F  . furosemide  40 mg Intravenous BID  . levothyroxine  100 mcg Oral QHS  . pantoprazole  40 mg Oral Q1200  .  potassium chloride SA  20 mEq Oral BID   Continuous Infusions: . DOBUTamine 5 mcg/kg/min (03/11/15 0036)    Marzetta Board, MD, PhD [facility] Pager [phone]  If 7PM-7AM, please contact night-coverage [email or web address] Password [email or web address] 03/11/2015, 1:19 PM

## 2015-03-11 NOTE — Consult Note (Signed)
Name: Sandra [name] MRN: [id number] DOB: [birth year]    ADMISSION DATE:  03/08/2015 CONSULTATION DATE:  2/27  REFERRING MD :  Bensimohn   CHIEF COMPLAINT:  Hemoptysis   BRIEF PATIENT DESCRIPTION: 82yo female with hx chronic sCHF/NICM with EF 25% on chronic outpt dobutamine, AFib on coumadin s/p recent AV node ablation (02/06/15) admitted 2/27 with aspiration PNA after "choking" episode at home.  She was started on IV abx and followed closely by heart failure team.  On 2/27 she developed increased SOB, cough and mild hemoptysis and PCCM consulted.   SIGNIFICANT EVENTS    STUDIES:  CT chest 2/25>>> RLL consolidation, cardiomegaly    HISTORY OF PRESENT ILLNESS:  82yo female with hx chronic sCHF/NICM with EF 25% on chronic outpt dobutamine, AFib on coumadin s/p recent AV node ablation (02/06/15) admitted 2/27 with aspiration PNA after "choking" episode at home.  She was started on IV abx and followed closely by heart failure team.  On 2/27 she developed increased SOB, cough and mild hemoptysis and PCCM consulted.  Currently feeling some better.  Dyspnea is about the same.  Still c/o pleuritic pain R lower back.  Actually feels as though hemoptysis is better. Several days ago she was coughing up "big, bright clots" with more frequency.  Still blood tinged this am but smaller amount, less frequency, light pink in color.  PAST MEDICAL HISTORY :   has a past medical history of Atrial fibrillation ([place]) permanent; Hyperlipidemia; Hypothyroidism; Bradycardia; Cardiomyopathy, hypertrophic nonobstructive ([place]); Anal fissure; Adenomatous colon polyp; Diverticulosis; ICD-CRT; Systolic heart failure; Hyperlipidemia; Hypertension; RBBB plus LA hemiblock; Family history of adverse reaction to anesthesia; CHF (congestive heart failure) ([place]); Varicose veins; Chronic bronchitis ([place]); Migraines; History of gout; Situational anxiety; Chronic renal insufficiency, stage III (moderate); Basal cell carcinoma; and  Aspiration pneumonia ([place]) (03/08/2015).  has past surgical history that includes Cardioversion; Cardiac defibrillator placement (2007); Vaginal hysterectomy; [place] surgery (1989); Biv icd genertaor change out (N/A, 03/23/2011); Colonoscopy; Cardiac catheterization (N/A, 10/29/2014); Cardioversion (N/A, 11/09/2014); AV node ablation (02/06/2015); Cataract extraction w/ intraocular lens  implant, bilateral (Bilateral, 2000s); Colonoscopy; PICC LINE PLACE PERIPHERAL ([place] [facility]X) (Right, 10/2014); and Cardiac catheterization (N/A, 02/06/2015). Prior to Admission medications   Medication Sig Start Date End Date Taking? Authorizing Provider  acetaminophen (TYLENOL) 325 MG tablet Take 650 mg by mouth every 6 (six) hours as needed for mild pain or headache. Reported on 03/07/2015   Yes Historical Provider, MD  ezetimibe (ZETIA) 10 MG tablet Take 10 mg by mouth every Monday, Wednesday, and Friday.    Yes Historical Provider, MD  fluticasone (FLONASE) 50 MCG/ACT nasal spray Place 2 sprays into both nostrils daily. 09/27/14  Yes Marin Olp, MD  levothyroxine (SYNTHROID, LEVOTHROID) 100 MCG tablet Take 100 mcg by mouth at bedtime.   Yes Historical Provider, MD  potassium chloride SA (K-DUR,KLOR-CON) 10 MEQ tablet Take 1 tablet (10 mEq total) by mouth 2 (two) times daily. 03/06/15  Yes Shirley Friar, PA-C  torsemide (DEMADEX) 20 MG tablet Take 2 tablets (40 mg total) by mouth 2 (two) times daily. 02/25/15  Yes Jolaine Artist, MD  triamcinolone cream (KENALOG) 0.1 % Apply 1 application topically 2 (two) times daily as needed (itching/rash).   Yes Historical Provider, MD  VITAMIN E PO Take 1 tablet by mouth daily.   Yes Historical Provider, MD  warfarin (COUMADIN) 5 MG tablet Take 2.5-5 mg by mouth as directed. Take 2.5 mg (1/2 tablet) daily except 5 mg (1 tablet) on Monday  Yes Historical Provider, MD  clindamycin (CLEOCIN) 150 MG capsule Take 3 capsules (450 mg total) by mouth 3 (three) times daily.  03/07/15   Marin Olp, MD  DOBUTamine (DOBUTREX) 4-5 MG/ML-% infusion Inject 330 mcg/min into the vein continuous. 11/13/14   Amy Estrella Deeds, NP   Allergies  Allergen Reactions  . Levofloxacin Other (See Comments)    Due to cardiac  AF  . Procaine Hcl Other (See Comments)    REACTION: smothers  . Spironolactone Diarrhea    Abdominal pain  . Amiodarone Nausea And Vomiting  . Tape Other (See Comments)    Tears skin off.  Please use "paper" tape only  . Amoxicillin Other (See Comments)    Rash   . Codeine Other (See Comments)    REACTION: nausea  . Niacin Other (See Comments)    REACTION: rash  . Ramipril Other (See Comments)    unknown  . Statins Other (See Comments)    REACTION: muscle aches    FAMILY HISTORY:  family history includes Breast cancer in her paternal aunt; COPD in her father; Cardiomyopathy in her daughter; Colon cancer in her father; Colon polyps in her father; Heart disease in her father and mother. There is no history of Diabetes or Kidney disease. SOCIAL HISTORY:  reports that she quit smoking about 28 years ago. Her smoking use included Cigarettes. She has a 3.8 pack-year smoking history. She has never used smokeless tobacco. She reports that she does not drink alcohol or use illicit drugs.  REVIEW OF SYSTEMS:   As per HPI - All other systems reviewed and were neg.    SUBJECTIVE:   VITAL SIGNS: Temp:  [97.6 F (36.4 C)-98.1 F (36.7 C)] 97.6 F (36.4 C) (02/27 0922) Pulse Rate:  [68-72] 68 (02/27 0500) Resp:  [24-26] 26 (02/27 0500) BP: (118-124)/(73-76) 118/73 mmHg (02/27 0500) SpO2:  [100 %] 100 % (02/27 0500) Weight:  [67.45 kg (148 lb 11.2 oz)] 67.45 kg (148 lb 11.2 oz) (02/27 0500)  PHYSICAL EXAMINATION: General:  Pleasant female, NAD sitting OOB in chair  Neuro:  Awake, alert appropriate  HEENT:  Mm moist, JVD noted Cardiovascular:  s1s2 rrr Lungs:  resps even, non labored on 3L [place], observed emesis basin with 4-5 tissues each containing  tiny amount pink sputum Abdomen:  Mildly distended, non tender, +bs  Musculoskeletal:  Warm and dry, no edema    Recent Labs Lab 03/09/15 0549 03/10/15 0721 03/11/15 0449  NA 134* 130* 132*  K 3.8 4.9 3.7  CL 90* 90* 90*  CO2 28 24 28   BUN 49* 55* 53*  CREATININE 2.16* 2.36* 2.36*  GLUCOSE 112* 85 88    Recent Labs Lab 03/09/15 0549 03/10/15 0721 03/11/15 0449  HGB 10.1* 10.0* 9.3*  HCT 33.8* 32.0* 30.4*  WBC 12.9* 13.2* 9.0  PLT 193 193 195   No results found.  ASSESSMENT / PLAN:  Hemoptysis - improving.  In setting significant aspiration PNA and supratherapeutic INR on coumadin.  Aspiration PNA  AFib on coumadin - s/p ablation 01/2015  PLAN -  Hold coumadin per pharmacy  F/u INR  Cont abx per primary for aspiration PNA  F/u pct  No acute indication for FOB at this time Mobilize as able  pulm hygiene  Intermittent f/u CXR    Nickolas Madrid, NP 03/11/2015  11:36 AM Pager: [phone] or [phone]  STAFF NOTE: I, Merrie Roof, MD FACP have personally reviewed patient's available data, including medical history, events of  note, physical examination and test results as part of my evaluation. I have discussed with resident/NP and other care providers such as pharmacist, RN and RRT. In addition, I personally evaluated patient and elicited key findings of: a little hoarse, no distress, in chair, coarse rt base posterior, clear aspiration event reported, ct chest c/w this and maybe some alveolar bleed on top likely fom excessive coughing on coumadin INR 4 and some burn / irritation from aspiration event, amount of hemoptysis is scant and reduced worse in am when flat for a while, no bronch indicated at this stage, would add cough suppressant as coughing excessive with INR 4 contributing, add PPI during hospital stay, I see she passed swallow testing, assess pcxr in am ,cbc in am and allow INR to drift down, i updated daughter and pt in full, i suspect with  suppressed cough and inr down this will resolve, bronch now may harm   Lavon Paganini. Titus Mould, MD, [place] Pgr: [facility] 03/11/2015 11:55 AM

## 2015-03-11 NOTE — Evaluation (Signed)
Physical Therapy Evaluation Patient Details Name: Sandra [name] MRN: [id number] DOB: [birth year] Today's Date: 03/11/2015   History of Present Illness  Pt is a 82 y/o F w/ severe aspiration pneumonia.  Pt's PMH includes chronic a-fib, chronic systolic CHF w/ low output state on chronic dobutamine, CKD, RBB, gout, anxiety.  Clinical Impression  Pt admitted with above diagnosis. Pt currently with functional limitations due to the deficits listed below (see PT Problem List). Currently requires min assist to steady, desats to 86% on 2L O2 after ambulating to hallway as pt begins wheezing.  Pt presents w/ generalized weakness, SOB, and decreased awareness of safety/deficits.  Her family will likely not be able to provide the 24/7 assist she will likely require at d/c,therefore, recommending [place] aide in addition to [place].  Pt will benefit from skilled PT to increase their independence and safety with mobility to allow discharge to the venue listed below.      Follow Up Recommendations Home health PT;Supervision for mobility/OOB [facility] aide; Will likely need SNF if no 24/7 through [facility] services)    Equipment Recommendations  None recommended by PT    Recommendations for Other Services OT consult     Precautions / Restrictions Precautions Precautions: Fall Precaution Comments: watch O2; do not use bed pads, use cotton instead Restrictions Weight Bearing Restrictions: No      Mobility  Bed Mobility Overal bed mobility: Needs Assistance Bed Mobility: Supine to Sit     Supine to sit: Min guard     General bed mobility comments: Increased time w/ use of bed rails  Transfers Overall transfer level: Needs assistance Equipment used: None Transfers: Sit to/from Stand Sit to Stand: Min assist         General transfer comment: Min assist to steady as pt reaching out for computer table for support.  Cues to avoid reaching out for objects that will move.  Ambulation/Gait Ambulation/Gait  assistance: Min assist Ambulation Distance (Feet): 20 Feet Assistive device: None Gait Pattern/deviations: Decreased stride length;Antalgic;Trunk flexed   Gait velocity interpretation: Below normal speed for age/gender General Gait Details: Cues to not reach out for unstable objects to avoid falling as pt attempts to couch surf.  SpO2 down to 86% after ambulating to hallway as pt begins to wheeze and c/o nausea.  Cues for pursed lip breathing.  Stairs            Wheelchair Mobility    Modified Rankin (Stroke Patients Only)       Balance Overall balance assessment: Needs assistance Sitting-balance support: Bilateral upper extremity supported;Feet supported Sitting balance-Leahy Scale: Good     Standing balance support: No upper extremity supported;During functional activity Standing balance-Leahy Scale: Poor Standing balance comment: Pt reaching out for support due to instability                             Pertinent Vitals/Pain Pain Assessment: 0-10 Pain Score: 6  Pain Location: headache, Lt lower chest Pain Descriptors / Indicators: Aching;Discomfort Pain Intervention(s): Limited activity within patient's tolerance;Monitored during session;Repositioned    Home Living Family/patient expects to be discharged to:: Private residence Living Arrangements: Spouse/significant other Available Help at Discharge: Family;Available 24 hours/day (husaband and retired daughter) Type of Home: House Home Access: Level entry     [place]: One [place]: Environmental consultant - 2 wheels;Cane - single point;Bedside commode;Wheelchair - manual Additional Comments: Has two steps from TV room w/o rails.  Pt had  recent fall navigating these steps in October 2016    Prior Function Level of Independence: Independent         Comments: Pt reports she was independent w/ all ADLs but has modified her clothing so it is easier to donn/doff.  She takes a sponge bath on her own  that takes ~2 hrs (would benefit from OT eval)     Hand Dominance        Extremity/Trunk Assessment   Upper Extremity Assessment: Generalized weakness           Lower Extremity Assessment: Generalized weakness      Cervical / Trunk Assessment: Kyphotic  Communication   Communication: No difficulties  Cognition Arousal/Alertness: Awake/alert Behavior During Therapy: Anxious;Impulsive Overall Cognitive Status: Impaired/Different from baseline Area of Impairment: Safety/judgement         Safety/Judgement: Decreased awareness of deficits;Decreased awareness of safety          General Comments General comments (skin integrity, edema, etc.): Had conversation w/ daughter who reports her father has dementia and short term memory loss.  Daughter is concerned that the oldest daughter (who is retired) will not be able to provide 24/7 assist for pt.    Exercises        Assessment/Plan    PT Assessment Patient needs continued PT services  PT Diagnosis Difficulty walking   PT Problem List Decreased strength;Decreased activity tolerance;Decreased balance;Decreased mobility;Decreased knowledge of use of DME;Decreased safety awareness;Cardiopulmonary status limiting activity;Pain  PT Treatment Interventions DME instruction;Gait training;Stair training;Functional mobility training;Therapeutic activities;Therapeutic exercise;Balance training;Patient/family education;Cognitive remediation   PT Goals (Current goals can be found in the Care Plan section) Acute Rehab PT Goals Patient Stated Goal: to feel better PT Goal Formulation: With patient/family Time For Goal Achievement: 03/25/15 Potential to Achieve Goals: Good    Frequency Min 3X/week   Barriers to discharge Inaccessible home environment;Decreased caregiver support 2 steps in home and intermittent assist from family    Co-evaluation               End of Session Equipment Utilized During Treatment: Gait  belt;Oxygen Activity Tolerance: Patient limited by fatigue;Treatment limited secondary to medical complications (Comment) (wheezing w/ SpO2 drop) Patient left: in chair;with call bell/phone within reach;with chair alarm set;with family/visitor present Nurse Communication: Mobility status;Other (comment) (SpO2, wheezing)         Time: [id number] PT Time Calculation (min) (ACUTE ONLY): 25 min   Charges:   PT Evaluation $PT Eval Moderate Complexity: 1 Procedure PT Treatments $Gait Training: 8-22 mins   PT G CodesJoslyn Hy PT, DPT [phone] Pager: [phone] 03/11/2015, 11:01 AM

## 2015-03-11 NOTE — Progress Notes (Signed)
Advanced Heart Failure Rounding Note   Subjective:    Feels worse today. Coughing. + mild hemoptysis. CP is getting better. Weight up. + orthopnea.      Objective:   Weight Range:  Vital Signs:   Temp:  [97.6 F (36.4 C)-98.1 F (36.7 C)] 97.6 F (36.4 C) (02/27 0922) Pulse Rate:  [68-72] 68 (02/27 0500) Resp:  [24-26] 26 (02/27 0500) BP: (118-124)/(73-76) 118/73 mmHg (02/27 0500) SpO2:  [100 %] 100 % (02/27 0500) Weight:  [67.45 kg (148 lb 11.2 oz)] 67.45 kg (148 lb 11.2 oz) (02/27 0500) Last BM Date: 03/11/15  Weight change: Filed Weights   03/08/15 0056 03/10/15 0500 03/11/15 0500  Weight: 61.689 kg (136 lb) 61.871 kg (136 lb 6.4 oz) 67.45 kg (148 lb 11.2 oz)    Intake/Output:   Intake/Output Summary (Last 24 hours) at 03/11/15 0934 Last data filed at 03/11/15 0700  Gross per 24 hour  Intake    510 ml  Output    300 ml  Net    210 ml     Physical Exam: General: weak appearing. coughing HEENT: normal  Neck: supple. JVP to jaw Carotids 2+ bilat; no bruits. No thyromegaly or nodule noted.  Cor: PMI laterally displaced. regular  Lungs: Crackles throughout RLL  Abdomen: soft, NT, +distended, no HSM. No bruits or masses. +BS  Extremities: no cyanosis, clubbing, rash, trace - 1+ edema  Neuro: alert & oriented x 3, cranial nerves grossly intact. moves all 4 extremities w/o difficulty. Affect pleasant.  Skin: no rash.  Telemetry: AF with v-pacing  Labs: Basic Metabolic Panel:  Recent Labs Lab 03/08/15 0120 03/09/15 0549 03/10/15 0721 03/11/15 0449  NA 134* 134* 130* 132*  K 3.4* 3.8 4.9 3.7  CL 92* 90* 90* 90*  CO2 29 28 24 28   GLUCOSE 118* 112* 85 88  BUN 44* 49* 55* 53*  CREATININE 1.98* 2.16* 2.36* 2.36*  CALCIUM 9.2 9.4 9.4 9.0    Liver Function Tests:  Recent Labs Lab 03/08/15 0120  AST 36  ALT 21  ALKPHOS 81  BILITOT 2.0*  PROT 7.7  ALBUMIN 4.0   No results for input(s): LIPASE, AMYLASE in the last 168 hours. No results for  input(s): AMMONIA in the last 168 hours.  CBC:  Recent Labs Lab 03/08/15 0120 03/09/15 0549 03/10/15 0721 03/11/15 0449  WBC 9.9 12.9* 13.2* 9.0  NEUTROABS 8.5*  --   --   --   HGB 11.1* 10.1* 10.0* 9.3*  HCT 36.0 33.8* 32.0* 30.4*  MCV 80.5 79.9 77.9* 78.8  PLT 243 193 193 195    Cardiac Enzymes:  Recent Labs Lab 03/08/15 0120 03/08/15 1834 03/09/15 0025 03/09/15 0549  TROPONINI 0.24* 0.41* 0.51* 0.44*    BNP: BNP (last 3 results)  Recent Labs  02/25/15 1115 03/08/15 0120  BNP 1364.1* 1414.9*    ProBNP (last 3 results) No results for input(s): PROBNP in the last 8760 hours.    Other results:  Imaging: No results found.   Medications:     Scheduled Medications: . ezetimibe  10 mg Oral Q M,W,F  . furosemide  40 mg Intravenous BID  . levothyroxine  100 mcg Oral QHS  . piperacillin-tazobactam (ZOSYN)  IV  2.25 g Intravenous 4 times per day  . potassium chloride SA  10 mEq Oral BID  . potassium chloride  40 mEq Oral Once  . Warfarin - Pharmacist Dosing Inpatient   Does not apply q1800    Infusions: .  DOBUTamine 5 mcg/kg/min (03/11/15 0036)    PRN Medications: acetaminophen, levalbuterol, ondansetron **OR** ondansetron (ZOFRAN) IV, traMADol   Assessment:   1. Acute respiratory failure  2. Aspiration PNA - RLL on CT 3. Acute on Chronic systolic HF on home dobutamine 4. Chronic AF -s/p AV node ablation. Failed amio and Tikosyn.  5. Acute on CKD stage IV 1.9->2.2-> 2.4  Plan/Discussion:     She has severe aspiration PNA. Currently on abx per primary team. D/w Dr. Renne Crigler  She is now volume overloaded. Will give lasix 40 IV bid and see how she responds. Would continue dobutamine.   She has chronic AF. Now s/p AVN ablation and BiV pacing. Remains on coumadin . INR 4.0. Pharmacy dosing.   Minimally elevated troponin likely due to demand ischemia. No ischemic w/u needed at this point.   If PNA not improving can consider Pulmonary consult  for bronch to look for retained foreign matter though CT shows patent airways.    Length of Stay: 3   Glori Bickers MD 03/11/2015, 9:34 AM  Advanced Heart Failure Team Pager [phone] (M-F; [place])  Please contact [facility] for night-coverage after hours (4p -7a ) and weekends on [email or web address]

## 2015-03-11 NOTE — Plan of Care (Signed)
Problem: Education: Goal: Knowledge of [facility] Education information/materials will improve Outcome: Progressing Plan of care reviewed with patient.  Per RN fall risk assessment patient is a high fall risk. Fall risk measures in place.  RN instructed patient to call and wait for assistance prior to getting out of bed.  Patient stated understanding.  RN reviewed and educated patient on all medications prior to administration.  Patient has denied pain thus far this shift.  RN instructed patient to notify RN if she started having any pain and or discomfort.  Patient stated understanding.

## 2015-03-12 ENCOUNTER — Inpatient Hospital Stay (HOSPITAL_COMMUNITY): Payer: Medicare Other

## 2015-03-12 DIAGNOSIS — D689 Coagulation defect, unspecified: Secondary | ICD-10-CM | POA: Insufficient documentation

## 2015-03-12 DIAGNOSIS — R042 Hemoptysis: Secondary | ICD-10-CM

## 2015-03-12 LAB — COMPREHENSIVE METABOLIC PANEL
ALT: 24 U/L (ref 14–54)
ANION GAP: 14 (ref 5–15)
AST: 37 U/L (ref 15–41)
Albumin: 2.6 g/dL — ABNORMAL LOW (ref 3.5–5.0)
Alkaline Phosphatase: 72 U/L (ref 38–126)
BUN: 53 mg/dL — ABNORMAL HIGH (ref 6–20)
CO2: 28 mmol/L (ref 22–32)
Calcium: 9.1 mg/dL (ref 8.9–10.3)
Chloride: 92 mmol/L — ABNORMAL LOW (ref 101–111)
Creatinine, Ser: 2.2 mg/dL — ABNORMAL HIGH (ref 0.44–1.00)
GFR, EST AFRICAN AMERICAN: 23 mL/min — AB (ref >60)
GFR, EST NON AFRICAN AMERICAN: 20 mL/min — AB (ref >60)
GLUCOSE: 85 mg/dL (ref 65–99)
POTASSIUM: 3.5 mmol/L (ref 3.5–5.1)
SODIUM: 134 mmol/L — AB (ref 135–145)
Total Bilirubin: 1.6 mg/dL — ABNORMAL HIGH (ref 0.3–1.2)
Total Protein: 6.2 g/dL — ABNORMAL LOW (ref 6.5–8.1)

## 2015-03-12 LAB — CBC
HCT: 31.2 % — ABNORMAL LOW (ref 36.0–46.0)
Hemoglobin: 9.4 g/dL — ABNORMAL LOW (ref 12.0–15.0)
MCH: 23.9 pg — ABNORMAL LOW (ref 26.0–34.0)
MCHC: 30.1 g/dL (ref 30.0–36.0)
MCV: 79.4 fL (ref 78.0–100.0)
PLATELETS: 204 10*3/uL (ref 150–400)
RBC: 3.93 MIL/uL (ref 3.87–5.11)
RDW: 18.1 % — AB (ref 11.5–15.5)
WBC: 7.3 10*3/uL (ref 4.0–10.5)

## 2015-03-12 LAB — PROTIME-INR
INR: 3.93 — AB (ref 0.00–1.49)
Prothrombin Time: 37.5 seconds — ABNORMAL HIGH (ref 11.6–15.2)

## 2015-03-12 MED ORDER — FUROSEMIDE 10 MG/ML IJ SOLN
80.0000 mg | Freq: Two times a day (BID) | INTRAMUSCULAR | Status: AC
  Administered 2015-03-12 – 2015-03-13 (×3): 80 mg via INTRAVENOUS
  Filled 2015-03-12 (×3): qty 8

## 2015-03-12 MED ORDER — FUROSEMIDE 10 MG/ML IJ SOLN
40.0000 mg | INTRAMUSCULAR | Status: AC
  Administered 2015-03-12: 40 mg via INTRAVENOUS
  Filled 2015-03-12: qty 4

## 2015-03-12 MED ORDER — POTASSIUM CHLORIDE 10 MEQ/50ML IV SOLN
INTRAVENOUS | Status: AC
  Filled 2015-03-12: qty 50

## 2015-03-12 MED ORDER — POTASSIUM CHLORIDE CRYS ER 20 MEQ PO TBCR
20.0000 meq | EXTENDED_RELEASE_TABLET | Freq: Three times a day (TID) | ORAL | Status: DC
  Administered 2015-03-12: 20 meq via ORAL
  Filled 2015-03-12: qty 1

## 2015-03-12 MED ORDER — POTASSIUM CHLORIDE 10 MEQ/50ML IV SOLN
10.0000 meq | INTRAVENOUS | Status: AC
  Administered 2015-03-12 (×2): 10 meq via INTRAVENOUS
  Filled 2015-03-12: qty 50

## 2015-03-12 NOTE — Progress Notes (Signed)
Speech Language Pathology Treatment: Dysphagia  Patient Details Name: Sandra [name] MRN: [id number] DOB: September 16, 1932 Today's Date: 03/12/2015 Time: 2548-6282 SLP Time Calculation (min) (ACUTE ONLY): 10 min  Assessment / Plan / Recommendation Clinical Impression  F/u after 2/25 clinical swallow evaluation.  Pt has been tolerating a regular diet with thin liquids.  Despite aspiration event precipitating admission, she does not present with s/s of a chronic dysphagia - there are no overt signs of aspiration; RR is compatible with appropriate swallow/ventilatory sequence; expiration follows each swallow.  Recommend pt continue on current diet - no SLP needs are identified.  Our services will sign off at this time.   HPI HPI: 82 y.o. female with past history of chronic systolic CHF/NICM with EF of 25%, low output state maintained on chronic dobutamine at home , AICD, paroxysmal A. fib on warfarin, hypothyroidism history of AV node ablation on 02/06/15 presents to the ER with choking event and subsequent aspiration pna.       SLP Plan  All goals met     Recommendations  Diet recommendations: Regular;Thin liquid Liquids provided via: Cup;Straw Medication Administration: Whole meds with liquid Supervision: Patient able to self feed             Plan: All goals met     GO                [name] 03/12/2015, 11:18 AM

## 2015-03-12 NOTE — Progress Notes (Signed)
   Name: Sandra [name] MRN: [id number] DOB: 1932-12-25    ADMISSION DATE:  03/08/2015 CONSULTATION DATE:  2/27  REFERRING MD :  Bensimohn   CHIEF COMPLAINT:  Hemoptysis   BRIEF PATIENT DESCRIPTION: 82yo female with hx chronic sCHF/NICM with EF 25% on chronic outpt dobutamine, AFib on coumadin s/p recent AV node ablation (02/06/15) admitted 2/27 with aspiration PNA after "choking" episode at home.  She was started on IV abx and followed closely by heart failure team.  On 2/27 she developed increased SOB, cough and mild hemoptysis and PCCM consulted.   SIGNIFICANT EVENTS    STUDIES:  CT chest 2/25>>> RLL consolidation, cardiomegaly     SUBJECTIVE: blood tinged sputum - now dark Afebrile Feels nauseous with K  VITAL SIGNS: Temp:  [97.8 F (36.6 C)-98.1 F (36.7 C)] 97.8 F (36.6 C) (02/28 0500) Pulse Rate:  [72-73] 72 (02/28 0500) Resp:  [20] 20 (02/28 0500) BP: (101-120)/(57-71) 120/71 mmHg (02/28 0500) SpO2:  [99 %-100 %] 99 % (02/28 0500) Weight:  [138 lb (62.596 kg)] 138 lb (62.596 kg) (02/28 0500)  PHYSICAL EXAMINATION: General:  Pleasant female, NAD sitting in bed Neuro:  Awake, alert appropriate  HEENT:  Mm moist, JVD noted Cardiovascular:  s1s2 rrr Lungs:  resps even, non labored on 3L , observed emesis basin with 4-5 tissues each containing tiny amount pink sputum Abdomen:  Mildly distended, non tender, +bs  Musculoskeletal:  Warm and dry, no edema    Recent Labs Lab 03/10/15 0721 03/11/15 0449 03/12/15 0547  NA 130* 132* 134*  K 4.9 3.7 3.5  CL 90* 90* 92*  CO2 24 28 28   BUN 55* 53* 53*  CREATININE 2.36* 2.36* 2.20*  GLUCOSE 85 88 85    Recent Labs Lab 03/10/15 0721 03/11/15 0449 03/12/15 0547  HGB 10.0* 9.3* 9.4*  HCT 32.0* 30.4* 31.2*  WBC 13.2* 9.0 7.3  PLT 193 195 204   Dg Chest Port 1 View  03/12/2015  CLINICAL DATA:  Right lower lobe aspiration pneumonia, cough. EXAM: PORTABLE CHEST 1 VIEW COMPARISON:  Chest x-ray of 08 March 2015 and chest CT scan of 09 March 2015 FINDINGS: The left lung is hyperinflated but clear. On the right consolidation in the right perihilar region persists with mildly increased interstitial markings more peripherally. There is a small right pleural effusion. The cardiac silhouette remains enlarged. The pulmonary vascularity remains engorged and indistinct. The pacemaker defibrillator is in stable position. The right-sided PICC line tip projects over the distal third of the SVC. IMPRESSION: There has been some interval worsening in pulmonary interstitial edema predominantly on the right. Confluent alveolar opacity on the right is consistent with pneumonia. There is a small right pleural effusion. Stable cardiomegaly. Electronically Signed   By: David  Martinique M.D.   On: 03/12/2015 07:40    ASSESSMENT / PLAN:  Hemoptysis - improving.  In setting significant aspiration PNA and supratherapeutic INR on coumadin.  Aspiration PNA  AFib on coumadin - s/p ablation 01/2015  PLAN -  Hold coumadin per pharmacy  Cont abx per primary for aspiration PNA  F/u pct  No acute indication for FOB at this time Mobilize as able  pulm hygiene  Intermittent f/u CXR   Consider changing to IV potassium  ALVA,RAKESH V. MD  03/12/2015  11:38 AM

## 2015-03-12 NOTE — Progress Notes (Addendum)
Advanced Heart Failure Rounding Note   Subjective:    IV lasix started yesterday. Weight down. I/Os not accurate.   Still coughing but improved. See by Pulmonary yesterday - no bronch indicated.   INR still 3.9. Holding coumadin.    Objective:   Weight Range:  Vital Signs:   Temp:  [97.6 F (36.4 C)-98.1 F (36.7 C)] 97.8 F (36.6 C) (02/28 0500) Pulse Rate:  [72-73] 72 (02/28 0500) Resp:  [20] 20 (02/28 0500) BP: (101-120)/(57-71) 120/71 mmHg (02/28 0500) SpO2:  [99 %-100 %] 99 % (02/28 0500) Weight:  [62.596 kg (138 lb)] 62.596 kg (138 lb) (02/28 0500) Last BM Date: 03/12/15  Weight change: Filed Weights   03/10/15 0500 03/11/15 0500 03/12/15 0500  Weight: 61.871 kg (136 lb 6.4 oz) 67.45 kg (148 lb 11.2 oz) 62.596 kg (138 lb)    Intake/Output:   Intake/Output Summary (Last 24 hours) at 03/12/15 0847 Last data filed at 03/12/15 [id number]  Gross per 24 hour  Intake    780 ml  Output    350 ml  Net    430 ml     Physical Exam: General: weak appearing. HEENT: normal  Neck: supple. JVP to jaw Carotids 2+ bilat; no bruits. No thyromegaly or nodule noted.  Cor: PMI laterally displaced. regular  Lungs: Crackles throughout RLL  Abdomen: soft, NT, +mildly distended, no HSM. No bruits or masses. +BS  Extremities: no cyanosis, clubbing, rash, trace + edema  Neuro: alert & oriented x 3, cranial nerves grossly intact. moves all 4 extremities w/o difficulty. Affect pleasant.  Skin: no rash.  Telemetry: AF with v-pacing  Labs: Basic Metabolic Panel:  Recent Labs Lab 03/08/15 0120 03/09/15 0549 03/10/15 0721 03/11/15 0449 03/12/15 0547  NA 134* 134* 130* 132* 134*  K 3.4* 3.8 4.9 3.7 3.5  CL 92* 90* 90* 90* 92*  CO2 29 28 24 28 28   GLUCOSE 118* 112* 85 88 85  BUN 44* 49* 55* 53* 53*  CREATININE 1.98* 2.16* 2.36* 2.36* 2.20*  CALCIUM 9.2 9.4 9.4 9.0 9.1    Liver Function Tests:  Recent Labs Lab 03/08/15 0120 03/12/15 0547  AST 36 37  ALT 21 24    ALKPHOS 81 72  BILITOT 2.0* 1.6*  PROT 7.7 6.2*  ALBUMIN 4.0 2.6*   No results for input(s): LIPASE, AMYLASE in the last 168 hours. No results for input(s): AMMONIA in the last 168 hours.  CBC:  Recent Labs Lab 03/08/15 0120 03/09/15 0549 03/10/15 0721 03/11/15 0449 03/12/15 0547  WBC 9.9 12.9* 13.2* 9.0 7.3  NEUTROABS 8.5*  --   --   --   --   HGB 11.1* 10.1* 10.0* 9.3* 9.4*  HCT 36.0 33.8* 32.0* 30.4* 31.2*  MCV 80.5 79.9 77.9* 78.8 79.4  PLT 243 193 193 195 204    Cardiac Enzymes:  Recent Labs Lab 03/08/15 0120 03/08/15 1834 03/09/15 0025 03/09/15 0549  TROPONINI 0.24* 0.41* 0.51* 0.44*    BNP: BNP (last 3 results)  Recent Labs  02/25/15 1115 03/08/15 0120  BNP 1364.1* 1414.9*    ProBNP (last 3 results) No results for input(s): PROBNP in the last 8760 hours.    Other results:  Imaging: Dg Chest Port 1 View  03/12/2015  CLINICAL DATA:  Right lower lobe aspiration pneumonia, cough. EXAM: PORTABLE CHEST 1 VIEW COMPARISON:  Chest x-ray of 08 March 2015 and chest CT scan of 09 March 2015 FINDINGS: The left lung is hyperinflated but clear. On the right  consolidation in the right perihilar region persists with mildly increased interstitial markings more peripherally. There is a small right pleural effusion. The cardiac silhouette remains enlarged. The pulmonary vascularity remains engorged and indistinct. The pacemaker defibrillator is in stable position. The right-sided PICC line tip projects over the distal third of the SVC. IMPRESSION: There has been some interval worsening in pulmonary interstitial edema predominantly on the right. Confluent alveolar opacity on the right is consistent with pneumonia. There is a small right pleural effusion. Stable cardiomegaly. Electronically Signed   By: David  Martinique M.D.   On: 03/12/2015 07:40     Medications:     Scheduled Medications: . cefTRIAXone (ROCEPHIN)  IV  1 g Intravenous Q24H  . dextromethorphan   30 mg Oral BID  . ezetimibe  10 mg Oral Q M,W,F  . furosemide  40 mg Intravenous BID  . levothyroxine  100 mcg Oral QHS  . pantoprazole  40 mg Oral Q1200  . potassium chloride SA  20 mEq Oral BID    Infusions: . DOBUTamine 5 mcg/kg/min (03/11/15 0036)    PRN Medications: acetaminophen, levalbuterol, ondansetron **OR** ondansetron (ZOFRAN) IV, simethicone, traMADol   Assessment:   1. Acute respiratory failure  2. Aspiration PNA - RLL on CT 3. Acute on Chronic systolic HF on home dobutamine 4. Chronic AF -s/p AV node ablation. Failed amio and Tikosyn.  5. Acute on CKD stage IV 1.9->2.2-> 2.4 6. Hemoptysis 7. Hypokalemia  Plan/Discussion:     She has severe aspiration PNA with associated mild hemoptysis. Currently on abx per primary team. Pulmonary has seen as well - [facility]w Dr[facility] Renne Crigler  Volume status improving with IV lasix but still up. Continue IV lasix one more day - will increase to 80 bid. Renal function stable. Supp K+. Would continue dobutamine.   She has chronic AF. Now s/p AVN ablation and BiV pacing. Remains on coumadin . INR 3.9. Will hold warfarin again today. Pharmacy dosing.   Length of Stay: 4   Glori Bickers MD 03/12/2015, 8:47 AM  Advanced Heart Failure Team Pager [phone] (M-F; [place])  Please contact [facility] for night-coverage after hours (4p -7a ) and weekends on [email or web address]

## 2015-03-12 NOTE — Progress Notes (Addendum)
PROGRESS NOTE  Sandra [name] [id number] DOB: 1932/03/22 DOA: 03/08/2015 PCP: Garret Reddish, MD Outpatient Specialists:    LOS: 4 days   Brief Narrative: is a 82 y.o. female with past history of chronic systolic CHF/NICM with EF of 25%, low output state maintained on chronic dobutamine at home , AICD, paroxysmal A. fib on warfarin, hypothyroidism history of AV node ablation on 02/06/15, admitted on 2/24 with shortness of breath and pleuritic chest pain after episode of aspiration at home several days prior.  Assessment & Plan: Principal Problem:   Aspiration pneumonia ([place]) Active Problems:   Hypothyroidism   Chronic kidney disease, stage III (moderate)   Chronic systolic heart failure (HCC)   Persistent atrial fibrillation (HCC)   Lobar pneumonia (HCC)   Acute on chronic renal failure (HCC)   Aspiration pneumonia of right middle lobe (HCC)   Aspiration into airway   Hemoptysis   Coagulopathy (HCC)   Aspiration pneumonia  - Following choking incident a few days prior to presentation - continue antibiotics - Follow-up blood cultures - SLP evaluation without signs of aspiration - CT chest 2/25 with RLL consolidation compatible with pneumonia.  - hemoptysis stable, pulm following  Chronic systolic CHF/ low output state on chronic dobutamine - EF is 25%, Clinically compensated - Continue dobutamine per heart failure team - Continue IV diuresis per heart failure team    Elevated Troponin - no symptoms of ACS - suspect due to demand/pneumonia/CKD - no CAD based on cards notes  Persistent atrial fibrillation - Hold Coumadin tonight given elevated INR Discussed with pharmacy.  Hypothyroidism - Continue Synthroid  Chronic kidney disease, stage IV - Creatinine stable at baseline, slight worsening over the past 2 days, continue to closely monitor - Diuretics per heart failure team   DVT prophylaxis: Coumadin Code Status: Full Family Communication: No family at  bedside Disposition Plan: home vs SNF when ready  Barriers for discharge: IV antibiotics, diuresis  Consultants:   Cardiology   Pulmonary   Procedures:   None   Antimicrobials:  Zosyn 2/24 >> 2/27  Ceftriaxone 2/27 >>  Subjective: - feeling better. Worked with PT  Objective: Filed Vitals:   03/11/15 0922 03/11/15 1355 03/11/15 2100 03/12/15 0500  BP:  101/57 118/70 120/71  Pulse:  72 73 72  Temp: 97.6 F (36.4 C) 97.9 F (36.6 C) 98.1 F (36.7 C) 97.8 F (36.6 C)  TempSrc: Oral Oral    Resp:  20 20 20   Height:      Weight:    62.596 kg (138 lb)  SpO2:  99% 100% 99%    Intake/Output Summary (Last 24 hours) at 03/12/15 1157 Last data filed at 03/12/15 [id number]  Gross per 24 hour  Intake    780 ml  Output    350 ml  Net    430 ml   Filed Weights   03/10/15 0500 03/11/15 0500 03/12/15 0500  Weight: 61.871 kg (136 lb 6.4 oz) 67.45 kg (148 lb 11.2 oz) 62.596 kg (138 lb)    Examination: BP 120/71 mmHg  Pulse 72  Temp(Src) 97.8 F (36.6 C) (Oral)  Resp 20  Ht 5\' 4"  (1.626 m)  Wt 62.596 kg (138 lb)  BMI 23.68 kg/m2  SpO2 99% General exam: grimacing with deep breathing  Respiratory system:  No wheezing Cardiovascular system: regular rate and rhythm  Gastrointestinal system: Abdomen is nondistended, soft and nontender. Normal bowel sounds heard. Central nervous system: AxOx3. No focal deficits Extremities: No clubbing/cyanosis Skin: no rashes  Data Reviewed: I have personally reviewed following labs and imaging studies  CBC:  Recent Labs Lab 03/08/15 0120 03/09/15 0549 03/10/15 0721 03/11/15 0449 03/12/15 0547  WBC 9.9 12.9* 13.2* 9.0 7.3  NEUTROABS 8.5*  --   --   --   --   HGB 11.1* 10.1* 10.0* 9.3* 9.4*  HCT 36.0 33.8* 32.0* 30.4* 31.2*  MCV 80.5 79.9 77.9* 78.8 79.4  PLT 243 193 193 195 0000000   Basic Metabolic Panel:  Recent Labs Lab 03/08/15 0120 03/09/15 0549 03/10/15 0721 03/11/15 0449 03/12/15 0547  NA 134* 134* 130* 132* 134*    K 3.4* 3.8 4.9 3.7 3.5  CL 92* 90* 90* 90* 92*  CO2 29 28 24 28 28   GLUCOSE 118* 112* 85 88 85  BUN 44* 49* 55* 53* 53*  CREATININE 1.98* 2.16* 2.36* 2.36* 2.20*  CALCIUM 9.2 9.4 9.4 9.0 9.1   GFR: Estimated Creatinine Clearance: 17.3 mL/min (by C-G formula based on Cr of 2.2). Liver Function Tests:  Recent Labs Lab 03/08/15 0120 03/12/15 0547  AST 36 37  ALT 21 24  ALKPHOS 81 72  BILITOT 2.0* 1.6*  PROT 7.7 6.2*  ALBUMIN 4.0 2.6*   Coagulation Profile:  Recent Labs Lab 03/08/15 0120 03/09/15 0549 03/10/15 0721 03/11/15 0449 03/12/15 0547  INR 2.02* 2.36* 3.27* 3.99* 3.93*   Cardiac Enzymes:  Recent Labs Lab 03/08/15 0120 03/08/15 1834 03/09/15 0025 03/09/15 0549  TROPONINI 0.24* 0.41* 0.51* 0.44*   Urine analysis:    Component Value Date/Time   COLORURINE YELLOW 11/02/2014 1632   APPEARANCEUR CLOUDY* 11/02/2014 1632   LABSPEC 1.012 11/02/2014 1632   PHURINE 8.0 11/02/2014 1632   GLUCOSEU NEGATIVE 11/02/2014 1632   HGBUR LARGE* 11/02/2014 1632   BILIRUBINUR NEGATIVE 11/02/2014 1632   BILIRUBINUR n 10/29/2010 1421   KETONESUR NEGATIVE 11/02/2014 1632   PROTEINUR 30* 11/02/2014 1632   PROTEINUR trace 10/29/2010 1421   UROBILINOGEN 1.0 11/02/2014 1632   UROBILINOGEN 0.2 10/29/2010 1421   NITRITE POSITIVE* 11/02/2014 1632   NITRITE n 10/29/2010 1421   LEUKOCYTESUR LARGE* 11/02/2014 1632   Sepsis Labs: Invalid input(s): PROCALCITONIN, LACTICIDVEN  Recent Results (from the past 240 hour(s))  Blood culture (routine x 2)     Status: None (Preliminary result)   Collection Time: 03/08/15  1:20 AM  Result Value Ref Range Status   Specimen Description BLOOD LEFT ARM  Final   Special Requests BOTTLES DRAWN AEROBIC AND ANAEROBIC 5ML  Final   Culture   Final    NO GROWTH 3 DAYS Performed at [facility]    Report Status PENDING  Incomplete  Blood culture (routine x 2)     Status: None (Preliminary result)   Collection Time: 03/08/15  1:20 AM   Result Value Ref Range Status   Specimen Description BLOOD LEFT HAND  Final   Special Requests BOTTLES DRAWN AEROBIC AND ANAEROBIC 5ML  Final   Culture   Final    NO GROWTH 3 DAYS Performed at [facility]    Report Status PENDING  Incomplete      Radiology Studies: Dg Chest Port 1 View  03/12/2015  CLINICAL DATA:  Right lower lobe aspiration pneumonia, cough. EXAM: PORTABLE CHEST 1 VIEW COMPARISON:  Chest x-ray of 08 March 2015 and chest CT scan of 09 March 2015 FINDINGS: The left lung is hyperinflated but clear. On the right consolidation in the right perihilar region persists with mildly increased interstitial markings more peripherally. There is a small right  pleural effusion. The cardiac silhouette remains enlarged. The pulmonary vascularity remains engorged and indistinct. The pacemaker defibrillator is in stable position. The right-sided PICC line tip projects over the distal third of the SVC. IMPRESSION: There has been some interval worsening in pulmonary interstitial edema predominantly on the right. Confluent alveolar opacity on the right is consistent with pneumonia. There is a small right pleural effusion. Stable cardiomegaly. Electronically Signed   By: David  Martinique M.D.   On: 03/12/2015 07:40     Scheduled Meds: . cefTRIAXone (ROCEPHIN)  IV  1 g Intravenous Q24H  . dextromethorphan  30 mg Oral BID  . ezetimibe  10 mg Oral Q M,W,F  . furosemide  80 mg Intravenous BID  . levothyroxine  100 mcg Oral QHS  . pantoprazole  40 mg Oral Q1200  . potassium chloride  10 mEq Intravenous Q1 Hr x 2   Continuous Infusions: . DOBUTamine 5 mcg/kg/min (03/11/15 0036)    Marzetta Board, MD, PhD [facility] Pager [phone]  If 7PM-7AM, please contact night-coverage [email or web address] Password [facility] 03/12/2015, 11:57 AM

## 2015-03-12 NOTE — NC FL2 (Signed)
[place] LEVEL OF CARE SCREENING TOOL     IDENTIFICATION  Patient Name: Sandra [name] Birthdate: 1932/10/19 Sex: female Admission Date (Current Location): 03/08/2015  [facility] and [place] Number:  Herbalist and Address:  [facility] [place] [place] [address] [place], [place] [address]      Provider Number: [id number]  Attending Physician Name and Address:  Caren Griffins, MD  Relative Name and Phone Number:       Current Level of Care: Hospital Recommended Level of Care: [place] Prior Approval Number:    Date Approved/Denied:   PASRR Number:  [id number]  Discharge Plan: SNF    Current Diagnoses: Patient Active Problem List   Diagnosis Date Noted  . Hemoptysis   . Coagulopathy ([place])   . Aspiration into airway   . Aspiration pneumonia of right middle lobe ([place])   . Acute on chronic renal failure ([place])   . Lobar pneumonia ([place]) 03/08/2015  . Aspiration pneumonia ([place]) 03/08/2015  . CHF (congestive heart failure), NYHA class III ([place]) 02/06/2015  . Atrial fibrillation ([place]) 01/04/2015  . Drug rash 11/29/2014  . RBBB plus LA hemiblock   . Persistent atrial fibrillation ([place])   . Acute on chronic systolic congestive heart failure ([place]) 10/29/2014  . Cardiogenic shock ([place])   . Normal coronary arteries 2004 04/04/2014  . Essential hypertension, benign 10/16/2013  . Gout 10/16/2013  . Encounter for therapeutic drug monitoring 02/13/2013  . Dyspnea 12/07/2011  . RIATA Lead 03/23/2011  . Chronic systolic heart failure ([place]) 12/18/2010  . Biventricular implantable cardioverter-defibrillator -St. Jude 05/15/2010  . Anxiety 03/31/2010  . Chronic kidney disease, stage III (moderate) 12/30/2009  . History of colonic polyps 09/10/2008  . Hypertrophic obstructive cardiomyopathy ([place]) 02/08/2007  . Hypothyroidism 07/08/2006  . Hyperlipidemia 07/08/2006  . Fibrocystic breast disease 09/17/1991    Orientation  RESPIRATION BLADDER Height & Weight     Self, Time, Situation, Place  O2 (3.5 L) Continent Weight: 138 lb (62.596 kg) Height:  5\' 4"  (162.6 cm)  BEHAVIORAL SYMPTOMS/MOOD NEUROLOGICAL BOWEL NUTRITION STATUS   (appropriate)  (n/a) Continent Diet (Heart Healthy)  AMBULATORY STATUS COMMUNICATION OF NEEDS Skin   Limited Assist Verbally Normal                       Personal Care Assistance Level of Assistance  Bathing, Feeding, Dressing Bathing Assistance: Limited assistance Feeding assistance: Independent Dressing Assistance: Limited assistance     Functional Limitations Info  Sight, Hearing, Speech Sight Info: Adequate Hearing Info: Adequate Speech Info: Adequate    SPECIAL CARE FACTORS FREQUENCY  PT (By licensed PT), OT (By licensed OT)     PT Frequency: 5x/week OT Frequency: 5x/week            Contractures Contractures Info: Not present    Additional Factors Info  Code Status, Allergies Code Status Info: FULL CODE Allergies Info: Levofloxacin, Procaine Hcl, Spironolactone, Amiodarone, Tape, Amoxicillin, Codeine, Niacin, Ramipril, Statins           Current Medications (03/12/2015):  This is the current hospital active medication list Current Facility-Administered Medications  Medication Dose Route Frequency Provider Last Rate Last Dose  . acetaminophen (TYLENOL) tablet 650 mg  650 mg Oral Q6H PRN Domenic Polite, MD   650 mg at 03/11/15 2217  . cefTRIAXone (ROCEPHIN) 1 g in dextrose 5 % 50 mL IVPB  1 g Intravenous Q24H Raylene Miyamoto, MD   1 g  at 03/11/15 1311  . dextromethorphan (DELSYM) 30 MG/5ML liquid 30 mg  30 mg Oral BID Raylene Miyamoto, MD   30 mg at 03/12/15 1027  . DOBUTamine (DOBUTREX) infusion 4000 mcg/mL  5 mcg/kg/min Intravenous Continuous Domenic Polite, MD 5 mL/hr at 03/11/15 0036 5 mcg/kg/min at 03/11/15 0036  . ezetimibe (ZETIA) tablet 10 mg  10 mg Oral Q M,W,F Domenic Polite, MD   10 mg at 03/11/15 0830  . furosemide (LASIX) injection 80  mg  80 mg Intravenous BID Caren Griffins, MD      . levalbuterol Penne Lash) nebulizer solution 0.63 mg  0.63 mg Nebulization Q6H PRN Caren Griffins, MD   0.63 mg at 03/10/15 0250  . levothyroxine (SYNTHROID, LEVOTHROID) tablet 100 mcg  100 mcg Oral QHS Domenic Polite, MD   100 mcg at 03/11/15 2210  . ondansetron (ZOFRAN) tablet 4 mg  4 mg Oral Q6H PRN Domenic Polite, MD       Or  . ondansetron [facility]) injection 4 mg  4 mg Intravenous Q6H PRN Domenic Polite, MD   4 mg at 03/11/15 2222  . pantoprazole (PROTONIX) EC tablet 40 mg  40 mg Oral Q1200 Raylene Miyamoto, MD   40 mg at 03/11/15 1238  . potassium chloride 10 mEq in 50 mL *CENTRAL LINE* IVPB  10 mEq Intravenous Q1 Hr x 2 Kara Mead V, MD      . simethicone [facility]) chewable tablet 80 mg  80 mg Oral Q6H PRN Caren Griffins, MD   80 mg at 03/11/15 1659  . traMADol (ULTRAM) tablet 25-50 mg  25-50 mg Oral Q6H PRN Caren Griffins, MD   25 mg at 03/10/15 1307     Discharge Medications: Please see discharge summary for a list of discharge medications.  Relevant Imaging Results:  Relevant Lab Results:   Additional Information SS# [id number]   Welcome Intern, [id number]

## 2015-03-12 NOTE — Progress Notes (Signed)
ANTICOAGULATION CONSULT NOTE - Follow Up Consult  Pharmacy Consult for Coumadin Indication: atrial fibrillation  Allergies  Allergen Reactions  . Levofloxacin Other (See Comments)    Due to cardiac  AF  . Procaine Hcl Other (See Comments)    REACTION: smothers  . Spironolactone Diarrhea    Abdominal pain  . Amiodarone Nausea And Vomiting  . Tape Other (See Comments)    Tears skin off.  Please use "paper" tape only  . Amoxicillin Other (See Comments)    Rash   . Codeine Other (See Comments)    REACTION: nausea  . Niacin Other (See Comments)    REACTION: rash  . Ramipril Other (See Comments)    unknown  . Statins Other (See Comments)    REACTION: muscle aches    Patient Measurements: Height: 5\' 4"  (162.6 cm) Weight: 138 lb (62.596 kg) IBW/kg (Calculated) : 54.7  Vital Signs: Temp: 97.8 F (36.6 C) (02/28 0500) BP: 120/71 mmHg (02/28 0500) Pulse Rate: 72 (02/28 0500)  Labs:  Recent Labs  03/10/15 0721 03/11/15 0449 03/12/15 0547  HGB 10.0* 9.3* 9.4*  HCT 32.0* 30.4* 31.2*  PLT 193 195 204  LABPROT 32.7* 37.9* 37.5*  INR 3.27* 3.99* 3.93*  CREATININE 2.36* 2.36* 2.20*    Estimated Creatinine Clearance: 17.3 mL/min (by C-G formula based on Cr of 2.2).   Medications:  Prescriptions prior to admission  Medication Sig Dispense Refill Last Dose  . acetaminophen (TYLENOL) 325 MG tablet Take 650 mg by mouth every 6 (six) hours as needed for mild pain or headache. Reported on 03/07/2015   PRN  . ezetimibe (ZETIA) 10 MG tablet Take 10 mg by mouth every Monday, Wednesday, and Friday.    03/08/2015 at Unknown time  . fluticasone (FLONASE) 50 MCG/ACT nasal spray Place 2 sprays into both nostrils daily. 16 g 6 03/07/2015 at Unknown time  . levothyroxine (SYNTHROID, LEVOTHROID) 100 MCG tablet Take 100 mcg by mouth at bedtime.   03/07/2015 at Unknown time  . potassium chloride SA (K-DUR,KLOR-CON) 10 MEQ tablet Take 1 tablet (10 mEq total) by mouth 2 (two) times daily.    03/08/2015 at Unknown time  . torsemide (DEMADEX) 20 MG tablet Take 2 tablets (40 mg total) by mouth 2 (two) times daily. 120 tablet 6 03/08/2015 at Unknown time  . triamcinolone cream (KENALOG) 0.1 % Apply 1 application topically 2 (two) times daily as needed (itching/rash).   PRN  . VITAMIN E PO Take 1 tablet by mouth daily.   03/08/2015 at Unknown time  . warfarin (COUMADIN) 5 MG tablet Take 2.5-5 mg by mouth as directed. Take 2.5 mg (1/2 tablet) daily except 5 mg (1 tablet) on Monday   03/07/2015 at Unknown time  . clindamycin (CLEOCIN) 150 MG capsule Take 3 capsules (450 mg total) by mouth 3 (three) times daily. 63 capsule 0   . DOBUTamine (DOBUTREX) 4-5 MG/ML-% infusion Inject 330 mcg/min into the vein continuous. 250 mL 6 Taking    Assessment: 82 yo F on Coumadin PTA for hx afib.  INR 3.93  supratherapeutic with low doses of warfarin - likely related to acute illness and diet changes. LFTs wnl,  CBC remains stable, hemoptysis improving.  Goal of Therapy:  INR 2-3 Monitor platelets by anticoagulation protocol: Yes   Plan:  No Coumadin tonight Daily INR  Bonnita Nasuti Pharm.D. CPP, BCPS Clinical Pharmacist [phone] 03/12/2015 11:10 AM

## 2015-03-12 NOTE — Clinical Social Work Note (Signed)
Clinical Social Work Assessment  Patient Details  Name: Sandra [name] MRN: [id number] Date of Birth: 1932/05/19  Date of referral:  03/12/15               Reason for consult:  Facility Placement, Discharge Planning                Permission sought to share information with:  Family Supports, Customer service manager Permission granted to share information::  Yes, Verbal Permission Granted  Name::     [name]  Agency::  [facility]  Relationship::  Daughter  Contact Information:  [phone]  Housing/Transportation Living arrangements for the past 2 months:  [place] Information:  Patient, Adult Children, Spouse Patient Interpreter Needed:  None Criminal Activity/Legal Involvement Pertinent to Current Situation/Hospitalization:  No - Comment as needed Significant Relationships:  Adult Children Lives with:  Self, Spouse Do you feel safe going back to the place where you live?  Yes Need for family participation in patient care:  Yes (Comment)  Care giving concerns:  Patient lives at home alone. Physical therapy is recommending 24 hour supervision with home health. Home health does not provide 24 hour supervision. Patient considering [facility] as an alternative plan to [place].     Social Worker assessment / plan:  BSW intern met with patient, patient daughter, and patient friend at bedside to complete assessment. Patient has three daughters, one of which lives beside the patient. Patient has been receiving Home Health services from [facility]. A HH nurse comes to the patient's home three times a week. Patient lives in [place] and would like to return home at discharge. BSW intern explained to patient that patient could look at a SNF as an alternative plan, just to have another option at time of discharge. BSW intern explained that a SNF would provide 24 hour supervision. Patient wants to send her information to [facility] SN[facility] only.  CSW will continue to follow and assist as needed.  Employment status:  Retired Forensic scientist:  Information systems manager, Programmer, applications PT Recommendations:  [place], [place], Home with [place] / Referral to community resources:  [place]  Patient/Family's Response to care:  Patient and patient family appear to be happy with the care they are currently receiving. Patient daughter wants what is best for the patient.  Patient/Family's Understanding of and Emotional Response to Diagnosis, Current Treatment, and Prognosis:  Patient and patient family seem to understand the patient's current treatment and post discharge needs.  Emotional Assessment Appearance:  Appears stated age Attitude/Demeanor/Rapport:   (Pleasant, appropriate) Affect (typically observed):  Appropriate, Pleasant Orientation:  Oriented to Self, Oriented to Place, Oriented to  Time, Oriented to Situation Alcohol / Substance use:  Not Applicable Psych involvement (Current and /or in the community):  No (Comment)  Discharge Needs  Concerns to be addressed:  No discharge needs identified Readmission within the last 30 days:  No Current discharge risk:  Lives alone Barriers to Discharge:  Continued Medical Work up    [employer], [phone]

## 2015-03-12 NOTE — Clinical Social Work Placement (Signed)
   CLINICAL SOCIAL WORK PLACEMENT  NOTE  Date:  03/12/2015  Patient Details  Name: Sandra [name] MRN: [id number] Date of Birth: 1932-05-10  Clinical Social Work is seeking post-discharge placement for this patient at the [place] level of care (*CSW will initial, date and re-position this form in  chart as items are completed):  Yes   Patient/family provided with [place] Work Department's list of facilities offering this level of care within the geographic area requested by the patient (or if unable, by the patient's family).  Yes   Patient/family informed of their freedom to choose among providers that offer the needed level of care, that participate in Medicare, Medicaid or managed care program needed by the patient, have an available bed and are willing to accept the patient.  Yes   Patient/family informed of [place]'s ownership interest in [facility] and [facility], as well as of the fact that they are under no obligation to receive care at these facilities.  PASRR submitted to EDS on 03/11/15     PASRR number received on 03/11/15     Existing PASRR number confirmed on       FL2 transmitted to all facilities in geographic area requested by pt/family on 03/11/15     FL2 transmitted to all facilities within larger geographic area on 03/12/15     Patient informed that his/her managed care company has contracts with or will negotiate with certain facilities, including the following:            Patient/family informed of bed offers received.  Patient chooses bed at       Physician recommends and patient chooses bed at      Patient to be transferred to   on  .  Patient to be transferred to facility by       Patient family notified on   of transfer.  Name of family member notified:        PHYSICIAN       Additional Comment:    _______________________________________________ Rigoberto Noel, LCSW 03/12/2015, 5:25  PM

## 2015-03-12 NOTE — Clinical Social Work Note (Signed)
BSW intern sent referral to other SNFs per patient and family's request. CSW will folllowup with bed offers tomorrow.   Liz Beach MSW, [place], [place], [phone]

## 2015-03-12 NOTE — Care Management Note (Addendum)
Case Management Note  Patient Details  Name: Sandra [name] MRN: [id number] Date of Birth: 1932/06/22  Subjective/Objective:  Pt admitted for Aspiration Pneumonia. Pt is from home with husband. Pt feels like a stay at [facility] will be beneficial once stable for d/c. Pt discussed [employer].                   Action/Plan: Pt was previously active with [facility] for [facility] IV dobutamine gtt. CM will continue to monitor for disposition needs.    Expected Discharge Date:                  Expected Discharge Plan:  Skilled Nursing Facility  In-House Referral:  Clinical Social Work  Discharge planning Services  CM Consult  Post Acute Care Choice:  Home Health, Resumption of Svcs/PTA Provider Choice offered to:     DME Arranged:  IV pump/equipment DME Agency:     HH Arranged:    [facility] Agency:     Status of Service:  Completed.  Medicare Important Message Given:  Yes Date Medicare IM Given:    Medicare IM give by:    Date Additional Medicare IM Given:    Additional Medicare Important Message give by:     If discussed at [place] of Stay Meetings, dates discussed:    Additional Comments: [address] Jacqlyn Krauss, RN,BSN, [phone] CM did speak with pt in reference to disposition to SNF. Daughter wants pt to transport via car. Pt has been on 02 in the hospital. Daughter states that pt is not in any distress and will travel by car. CM did state that if pt needs oxygen she will ambulance transport. Refusing ambulance transport at this time. No further needs from CM at this time.  Bethena Roys, RN 03/12/2015, 3:00 PM

## 2015-03-13 ENCOUNTER — Encounter: Payer: Medicare Other | Admitting: Nurse Practitioner

## 2015-03-13 DIAGNOSIS — I5023 Acute on chronic systolic (congestive) heart failure: Secondary | ICD-10-CM | POA: Insufficient documentation

## 2015-03-13 DIAGNOSIS — T17998D Other foreign object in respiratory tract, part unspecified causing other injury, subsequent encounter: Secondary | ICD-10-CM

## 2015-03-13 LAB — PROTIME-INR
INR: 2.95 — AB (ref 0.00–1.49)
PROTHROMBIN TIME: 30.2 s — AB (ref 11.6–15.2)

## 2015-03-13 LAB — BASIC METABOLIC PANEL
Anion gap: 13 (ref 5–15)
BUN: 44 mg/dL — AB (ref 6–20)
CHLORIDE: 92 mmol/L — AB (ref 101–111)
CO2: 28 mmol/L (ref 22–32)
Calcium: 9.1 mg/dL (ref 8.9–10.3)
Creatinine, Ser: 2 mg/dL — ABNORMAL HIGH (ref 0.44–1.00)
GFR calc Af Amer: 26 mL/min — ABNORMAL LOW (ref >60)
GFR calc non Af Amer: 22 mL/min — ABNORMAL LOW (ref >60)
GLUCOSE: 86 mg/dL (ref 65–99)
POTASSIUM: 3.7 mmol/L (ref 3.5–5.1)
Sodium: 133 mmol/L — ABNORMAL LOW (ref 135–145)

## 2015-03-13 LAB — CULTURE, BLOOD (ROUTINE X 2)
Culture: NO GROWTH
Culture: NO GROWTH

## 2015-03-13 MED ORDER — SODIUM CHLORIDE 0.9% FLUSH
10.0000 mL | Freq: Two times a day (BID) | INTRAVENOUS | Status: DC
  Administered 2015-03-13 – 2015-03-15 (×4): 10 mL

## 2015-03-13 MED ORDER — WARFARIN SODIUM 1 MG PO TABS: 0.5000 mg | ORAL_TABLET | Freq: Once | ORAL | Status: DC

## 2015-03-13 MED ORDER — SODIUM CHLORIDE 0.9% FLUSH: 10.0000 mL | INTRAVENOUS | Status: DC | PRN

## 2015-03-13 MED ORDER — WARFARIN - PHARMACIST DOSING INPATIENT: Freq: Every day | Status: DC

## 2015-03-13 MED ORDER — POTASSIUM CHLORIDE CRYS ER 10 MEQ PO TBCR
10.0000 meq | EXTENDED_RELEASE_TABLET | Freq: Two times a day (BID) | ORAL | Status: DC
  Administered 2015-03-13 (×2): 10 meq via ORAL
  Filled 2015-03-13 (×5): qty 1

## 2015-03-13 NOTE — Progress Notes (Addendum)
ANTICOAGULATION CONSULT NOTE - Follow Up Consult  Pharmacy Consult for Coumadin Indication: atrial fibrillation  Allergies  Allergen Reactions  . Levofloxacin Other (See Comments)    Due to cardiac  AF  . Procaine Hcl Other (See Comments)    REACTION: smothers  . Spironolactone Diarrhea    Abdominal pain  . Amiodarone Nausea And Vomiting  . Tape Other (See Comments)    Tears skin off.  Please use "paper" tape only  . Amoxicillin Other (See Comments)    Rash   . Codeine Other (See Comments)    REACTION: nausea  . Niacin Other (See Comments)    REACTION: rash  . Ramipril Other (See Comments)    unknown  . Statins Other (See Comments)    REACTION: muscle aches    Patient Measurements: Height: 5\' 4"  (162.6 cm) Weight: 139 lb 3.2 oz (63.141 kg) IBW/kg (Calculated) : 54.7  Vital Signs: Temp: 97.6 F (36.4 C) (03/01 0408) Temp Source: Oral (03/01 0408) BP: 122/73 mmHg (03/01 0408) Pulse Rate: 72 (03/01 0408)  Labs:  Recent Labs  03/11/15 0449 03/12/15 0547 03/13/15 0420  HGB 9.3* 9.4*  --   HCT 30.4* 31.2*  --   PLT 195 204  --   LABPROT 37.9* 37.5* 30.2*  INR 3.99* 3.93* 2.95*  CREATININE 2.36* 2.20* 2.00*    Estimated Creatinine Clearance: 19.1 mL/min (by C-G formula based on Cr of 2).   Medications:  Prescriptions prior to admission  Medication Sig Dispense Refill Last Dose  . acetaminophen (TYLENOL) 325 MG tablet Take 650 mg by mouth every 6 (six) hours as needed for mild pain or headache. Reported on 03/07/2015   PRN  . ezetimibe (ZETIA) 10 MG tablet Take 10 mg by mouth every Monday, Wednesday, and Friday.    03/08/2015 at Unknown time  . fluticasone (FLONASE) 50 MCG/ACT nasal spray Place 2 sprays into both nostrils daily. 16 g 6 03/07/2015 at Unknown time  . levothyroxine (SYNTHROID, LEVOTHROID) 100 MCG tablet Take 100 mcg by mouth at bedtime.   03/07/2015 at Unknown time  . potassium chloride SA (K-DUR,KLOR-CON) 10 MEQ tablet Take 1 tablet (10 mEq total)  by mouth 2 (two) times daily.   03/08/2015 at Unknown time  . torsemide (DEMADEX) 20 MG tablet Take 2 tablets (40 mg total) by mouth 2 (two) times daily. 120 tablet 6 03/08/2015 at Unknown time  . triamcinolone cream (KENALOG) 0.1 % Apply 1 application topically 2 (two) times daily as needed (itching/rash).   PRN  . VITAMIN E PO Take 1 tablet by mouth daily.   03/08/2015 at Unknown time  . warfarin (COUMADIN) 5 MG tablet Take 2.5-5 mg by mouth as directed. Take 2.5 mg (1/2 tablet) daily except 5 mg (1 tablet) on Monday   03/07/2015 at Unknown time  . clindamycin (CLEOCIN) 150 MG capsule Take 3 capsules (450 mg total) by mouth 3 (three) times daily. 63 capsule 0   . DOBUTamine (DOBUTREX) 4-5 MG/ML-% infusion Inject 330 mcg/min into the vein continuous. 250 mL 6 Taking    Assessment: 82 yo F on Coumadin PTA for hx afib.  INR 3.93 yesterday now down to 2.9 at goal. LFTs wnl,  CBC remains stable, hemoptysis improving, small amount overnight and unable to use spirometer.  Hold warfarin for just one more day.  Goal of Therapy:  INR 2-3 Monitor platelets by anticoagulation protocol: Yes   Plan:  Hold warfarin Daily INR  Erin Hearing PharmD., BCPS Clinical Pharmacist Pager [phone] 03/13/2015 8:57  AM    

## 2015-03-13 NOTE — Progress Notes (Signed)
Advanced Heart Failure Rounding Note   Subjective:    IV lasix started 03/11/15. Weight stable. I/Os inaccurate.   Still with cough and blood tinged sputum.  Feeling better. Doesn't understand why she is fluid overloaded with low appetite and intake. Walking, but not very much.  Wants to try more today.   Seen by Pulmonary 03/11/15- no bronch indicated.  Appreciate input.  INR 2.95. Will resume coumadin with dosing per pharm.    Objective:   Weight Range:  Vital Signs:   Temp:  [97.6 F (36.4 C)-98 F (36.7 C)] 97.6 F (36.4 C) (03/01 0408) Pulse Rate:  [72] 72 (03/01 0408) Resp:  [17-20] 20 (03/01 0408) BP: (103-122)/(63-73) 122/73 mmHg (03/01 0408) SpO2:  [100 %] 100 % (03/01 0408) Weight:  [139 lb 3.2 oz (63.141 kg)] 139 lb 3.2 oz (63.141 kg) (03/01 0423) Last BM Date: 03/12/15  Weight change: Filed Weights   03/11/15 0500 03/12/15 0500 03/13/15 0423  Weight: 148 lb 11.2 oz (67.45 kg) 138 lb (62.596 kg) 139 lb 3.2 oz (63.141 kg)    Intake/Output:   Intake/Output Summary (Last 24 hours) at 03/13/15 0838 Last data filed at 03/13/15 0700  Gross per 24 hour  Intake    580 ml  Output    400 ml  Net    180 ml     Physical Exam: General: weak appearing. HEENT: normal  Neck: supple. JVP remains 8-9. Carotids 2+ bilat; no bruits. No thyromegaly or nodule noted.  Cor: PMI laterally displaced. regular  Lungs: Diminished with crackles throughout RLL  Abdomen: soft, NT, +mildly distended, no HSM. No bruits or masses. +BS  Extremities: no cyanosis, clubbing, rash, trace peripheral edema  Neuro: alert & oriented x 3, cranial nerves grossly intact. moves all 4 extremities w/o difficulty. Affect pleasant.  Skin: no rash.  Telemetry: Reviewed personally, AF with v-pacing  Labs: Basic Metabolic Panel:  Recent Labs Lab 03/09/15 0549 03/10/15 0721 03/11/15 0449 03/12/15 0547 03/13/15 0420  NA 134* 130* 132* 134* 133*  K 3.8 4.9 3.7 3.5 3.7  CL 90* 90* 90* 92*  92*  CO2 28 24 28 28 28   GLUCOSE 112* 85 88 85 86  BUN 49* 55* 53* 53* 44*  CREATININE 2.16* 2.36* 2.36* 2.20* 2.00*  CALCIUM 9.4 9.4 9.0 9.1 9.1    Liver Function Tests:  Recent Labs Lab 03/08/15 0120 03/12/15 0547  AST 36 37  ALT 21 24  ALKPHOS 81 72  BILITOT 2.0* 1.6*  PROT 7.7 6.2*  ALBUMIN 4.0 2.6*   No results for input(s): LIPASE, AMYLASE in the last 168 hours. No results for input(s): AMMONIA in the last 168 hours.  CBC:  Recent Labs Lab 03/08/15 0120 03/09/15 0549 03/10/15 0721 03/11/15 0449 03/12/15 0547  WBC 9.9 12.9* 13.2* 9.0 7.3  NEUTROABS 8.5*  --   --   --   --   HGB 11.1* 10.1* 10.0* 9.3* 9.4*  HCT 36.0 33.8* 32.0* 30.4* 31.2*  MCV 80.5 79.9 77.9* 78.8 79.4  PLT 243 193 193 195 204    Cardiac Enzymes:  Recent Labs Lab 03/08/15 0120 03/08/15 1834 03/09/15 0025 03/09/15 0549  TROPONINI 0.24* 0.41* 0.51* 0.44*    BNP: BNP (last 3 results)  Recent Labs  02/25/15 1115 03/08/15 0120  BNP 1364.1* 1414.9*    ProBNP (last 3 results) No results for input(s): PROBNP in the last 8760 hours.    Other results:  Imaging: Dg Chest Port 1 View  03/12/2015  CLINICAL DATA:  Right lower lobe aspiration pneumonia, cough. EXAM: PORTABLE CHEST 1 VIEW COMPARISON:  Chest x-ray of 08 March 2015 and chest CT scan of 09 March 2015 FINDINGS: The left lung is hyperinflated but clear. On the right consolidation in the right perihilar region persists with mildly increased interstitial markings more peripherally. There is a small right pleural effusion. The cardiac silhouette remains enlarged. The pulmonary vascularity remains engorged and indistinct. The pacemaker defibrillator is in stable position. The right-sided PICC line tip projects over the distal third of the SVC. IMPRESSION: There has been some interval worsening in pulmonary interstitial edema predominantly on the right. Confluent alveolar opacity on the right is consistent with pneumonia. There  is a small right pleural effusion. Stable cardiomegaly. Electronically Signed   By: David  Martinique M.D.   On: 03/12/2015 07:40     Medications:     Scheduled Medications: . cefTRIAXone (ROCEPHIN)  IV  1 g Intravenous Q24H  . dextromethorphan  30 mg Oral BID  . ezetimibe  10 mg Oral Q M,W,F  . furosemide  80 mg Intravenous BID  . levothyroxine  100 mcg Oral QHS  . pantoprazole  40 mg Oral Q1200    Infusions: . DOBUTamine 5 mcg/kg/min (03/13/15 0708)    PRN Medications: acetaminophen, levalbuterol, ondansetron **OR** ondansetron (ZOFRAN) IV, simethicone, traMADol   Assessment:   1. Acute respiratory failure  2. Aspiration PNA - RLL on CT 3. Acute on Chronic systolic HF on home dobutamine 4. Chronic AF -s/p AV node ablation. Failed amio and Tikosyn.  5. Acute on CKD stage IV 1.9->2.2-> 2.4 6. Hemoptysis 7. Hypokalemia  Plan/Discussion:     She has severe aspiration PNA with associated mild hemoptysis. Currently on abx per primary team. Pulmonary has seen as well - no bronch needed at this point.  Volume status improving with IV lasix but still up. Will continue IV lasix 80 mg bid today and reassess for possible transition to po in am. Renal function trending down. Supp K+. Continue dobutamine.   She has chronic AF. Now s/p AVN ablation and BiV pacing. Remains on coumadin. INR 2.93. Likely resume today with a low dose.  Will need to be monitored at least until tomorrow for INR. Pharmacy dosing.   She likely needs at least 1 more day of IV diuresis and INR monitoring.   Length of Stay: 5   Shirley Friar PA-C 03/13/2015, 8:38 AM  Advanced Heart Failure Team Pager [phone] (M-F; 7a - 4p)  Please contact [facility] for night-coverage after hours (4p -7a ) and weekends on [email or web address]  Patient seen and examined with Oda Kilts, PA-C. We discussed all aspects of the encounter. I agree with the assessment and plan as stated above.   Improving. Still volume  overloaded. Will continue IV lasix one more day. Hopefully can go home tomorrow from our standpoint.   Sujey Gundry,MD 1:40 PM

## 2015-03-13 NOTE — Progress Notes (Signed)
Physical Therapy Treatment Patient Details Name: Sandra [name] MRN: [id number] DOB: 08-Sep-1932 Today's Date: 03/13/2015    History of Present Illness Pt is a 81 y/o F w/ severe aspiration pneumonia.  Pt's PMH includes chronic a-fib, chronic systolic CHF w/ low output state on chronic dobutamine, CKD, RBB, gout, anxiety.    PT Comments    Pt performed increased gait distance with minor LOB x 2 requring min assist.  Gait is unsteady and fatigue limits patient.  Pt agreeable to d/c to [facility] for rehab she reports she cannot have assistnace 24/7.    Follow Up Recommendations  Home health PT;Supervision for mobility/OOB     Equipment Recommendations  None recommended by PT    Recommendations for Other Services  [facility] aide, will likely need SNF unless pt can provide assistance for herself from a caregiver 24/7)     Precautions / Restrictions Precautions Precautions: Fall Precaution Comments: watch O2; do not use bed pads, use cotton instead Restrictions Weight Bearing Restrictions: No    Mobility  Bed Mobility Overal bed mobility: Needs Assistance Bed Mobility: Supine to Sit     Supine to sit: Supervision     General bed mobility comments: Required use of bed rails and supervision for lines/leads.  Transfers Overall transfer level: Needs assistance Equipment used: None Transfers: Sit to/from Stand Sit to Stand: Supervision         General transfer comment: Pt stood impulsively without assistance.  Demonstrates improved balance.    Ambulation/Gait Ambulation/Gait assistance: Min assist Ambulation Distance (Feet): 160 Feet Assistive device: None Gait Pattern/deviations: Decreased step length - right;Decreased step length - left;Narrow base of support;Trunk flexed     General Gait Details: Pt intermittently furniture walking, grabbing to rails and bed frame.  Pt required cues for reciprocal armswing to improve balance.  Pt required x 4 rest breaks secondary to fatigue.   LOB x2.  Pt would benefit from RW next visit.     Stairs            Wheelchair Mobility    Modified Rankin (Stroke Patients Only)       Balance     Sitting balance-Leahy Scale: Good       Standing balance-Leahy Scale: Fair                      Cognition Arousal/Alertness: Awake/alert Behavior During Therapy: Impulsive (Pt standing before lines are set up and gait belt donned.  ) Overall Cognitive Status: Impaired/Different from baseline Area of Impairment: Safety/judgement         Safety/Judgement: Decreased awareness of deficits;Decreased awareness of safety          Exercises      General Comments        Pertinent Vitals/Pain Pain Assessment: No/denies pain Pain Score: 5  Pain Location: lower back Pain Descriptors / Indicators: Discomfort    Home Living                      Prior Function            PT Goals (current goals can now be found in the care plan section) Acute Rehab PT Goals Patient Stated Goal: to feel better Potential to Achieve Goals: Good Progress towards PT goals: Progressing toward goals    Frequency  Min 3X/week    PT Plan      Co-evaluation             End of  Session Equipment Utilized During Treatment: Gait belt Activity Tolerance: Patient limited by fatigue Patient left: in bed;with bed alarm set;with call bell/phone within reach     Time: 1045-1111 PT Time Calculation (min) (ACUTE ONLY): 26 min  Charges:  $Gait Training: 8-22 mins $Therapeutic Activity: 8-22 mins                    G Codes:      Cristela Blue 04-09-2015, 11:28 AM  Governor [place], PTA pager [phone]

## 2015-03-13 NOTE — Clinical Social Work Note (Signed)
CSW provided bed offers to patient.  Patient chose [facility].  Patient states her daughters will provide transportation to SNF at time of discharge.  CSW updated SNF regarding patient's choice.  Disposition: [facility] via family transport  Nonnie Done, [place] [phone]  5N1-9; 2S 15-16 and [place] Licensed Clinical Social Worker

## 2015-03-13 NOTE — Progress Notes (Signed)
   Name: Sandra [name] MRN: [id number] DOB: November 29, 1932    ADMISSION DATE:  03/08/2015 CONSULTATION DATE:  2/27  REFERRING MD :  Bensimohn   CHIEF COMPLAINT:  Hemoptysis   BRIEF PATIENT DESCRIPTION: 82yo female with hx chronic sCHF/NICM with EF 25% on chronic outpt dobutamine, AFib on coumadin s/p recent AV node ablation (02/06/15) admitted 2/27 with aspiration PNA after "choking" episode at home.  She was started on IV abx and followed closely by heart failure team.  On 2/27 she developed increased SOB, cough and mild hemoptysis and PCCM consulted.   SIGNIFICANT EVENTS    STUDIES:  CT chest 2/25>>> RLL consolidation, cardiomegaly     SUBJECTIVE: mild blood tinged sputum - overnight  Afebrile No nausea  VITAL SIGNS: Temp:  [97.6 F (36.4 C)-98 F (36.7 C)] 97.6 F (36.4 C) (03/01 0408) Pulse Rate:  [72] 72 (03/01 0408) Resp:  [17-20] 20 (03/01 0408) BP: (103-122)/(63-73) 122/73 mmHg (03/01 0408) SpO2:  [100 %] 100 % (03/01 0408) Weight:  [139 lb 3.2 oz (63.141 kg)] 139 lb 3.2 oz (63.141 kg) (03/01 0423)  PHYSICAL EXAMINATION: General:  Pleasant female, NAD sitting in bed Neuro:  Awake, alert appropriate  HEENT:  Mm moist, JVD noted Cardiovascular:  s1s2 rrr Lungs:  resps even, non labored on 3L [place] Abdomen:  Mildly distended, non tender, +bs  Musculoskeletal:  Warm and dry, no edema    Recent Labs Lab 03/11/15 0449 03/12/15 0547 03/13/15 0420  NA 132* 134* 133*  K 3.7 3.5 3.7  CL 90* 92* 92*  CO2 28 28 28   BUN 53* 53* 44*  CREATININE 2.36* 2.20* 2.00*  GLUCOSE 88 85 86    Recent Labs Lab 03/10/15 0721 03/11/15 0449 03/12/15 0547  HGB 10.0* 9.3* 9.4*  HCT 32.0* 30.4* 31.2*  WBC 13.2* 9.0 7.3  PLT 193 195 204   Dg Chest Port 1 View  03/12/2015  CLINICAL DATA:  Right lower lobe aspiration pneumonia, cough. EXAM: PORTABLE CHEST 1 VIEW COMPARISON:  Chest x-ray of 08 March 2015 and chest CT scan of 09 March 2015 FINDINGS: The left lung is  hyperinflated but clear. On the right consolidation in the right perihilar region persists with mildly increased interstitial markings more peripherally. There is a small right pleural effusion. The cardiac silhouette remains enlarged. The pulmonary vascularity remains engorged and indistinct. The pacemaker defibrillator is in stable position. The right-sided PICC line tip projects over the distal third of the SVC. IMPRESSION: There has been some interval worsening in pulmonary interstitial edema predominantly on the right. Confluent alveolar opacity on the right is consistent with pneumonia. There is a small right pleural effusion. Stable cardiomegaly. Electronically Signed   By: David  Martinique M.D.   On: 03/12/2015 07:40    ASSESSMENT / PLAN:  Hemoptysis - improving.  In setting significant aspiration PNA and supratherapeutic INR on coumadin.  Aspiration PNA  AFib on coumadin - s/p ablation 01/2015  PLAN -  Hold coumadin per pharmacy  Cont abx per primary for aspiration PNA , 5-7ds should suffice No acute indication for FOB  Mobilize as able  pulm hygiene    PCCM available as needed  Rigoberto Noel. MD  03/13/2015  9:18 AM

## 2015-03-13 NOTE — Progress Notes (Signed)
PROGRESS NOTE  Sandra [name] [id number] DOB: [birth year] DOA: 03/08/2015 PCP: Garret Reddish, MD Outpatient Specialists:    LOS: 5 days   Brief Narrative: is a 82 y.o. female with past history of chronic systolic CHF/NICM with EF of 25%, low output state maintained on chronic dobutamine at home , AICD, paroxysmal A. fib on warfarin, hypothyroidism history of AV node ablation on 02/06/15, admitted on 2/24 with shortness of breath and pleuritic chest pain after episode of aspiration at home several days prior. She was started on Zosyn, and heart failure and pulmonology were consulted. Clinically improving.   Assessment & Plan: Principal Problem:   Aspiration pneumonia ([place]) Active Problems:   Hypothyroidism   Chronic kidney disease, stage III (moderate)   Chronic systolic heart failure (HCC)   Persistent atrial fibrillation (HCC)   Lobar pneumonia (HCC)   Acute on chronic renal failure (HCC)   Aspiration pneumonia of right middle lobe (HCC)   Aspiration into airway   Hemoptysis   Coagulopathy (HCC)   Aspiration pneumonia  - Following choking incident a few days prior to presentation - continue antibiotics - Follow-up blood cultures - SLP evaluation without signs of aspiration - CT chest 2/25 with RLL consolidation compatible with pneumonia.  - hemoptysis stable, improving, pulm following  Chronic systolic CHF/ low output state on chronic dobutamine - EF is 25%, Clinically compensated - Continue dobutamine per heart failure team - Continue diuresis per heart failure team    Elevated Troponin - no symptoms of ACS - suspect due to demand/pneumonia/CKD - no CAD based on cards notes  Persistent atrial fibrillation - Hold Coumadin today still.  Hypothyroidism - Continue Synthroid  Chronic kidney disease, stage IV - Creatinine stable at baseline - Diuretics per heart failure team   DVT prophylaxis: Coumadin Code Status: Full Family Communication: No family at  bedside Disposition Plan: home vs SNF when ready  Barriers for discharge: IV antibiotics, diuresis  Consultants:   Cardiology   Pulmonary   Procedures:   None   Antimicrobials:  Zosyn 2/24 >> 2/27  Ceftriaxone 2/27 >>  Subjective: - feeling better. Worked with PT. Wants to try and come off oxygen today   Objective: Filed Vitals:   03/12/15 2156 03/13/15 0408 03/13/15 0423 03/13/15 1115  BP: 121/72 122/73    Pulse: 72 72  84  Temp: 97.9 F (36.6 C) 97.6 F (36.4 C)    TempSrc: Oral Oral    Resp: 17 20    Height:      Weight:   63.141 kg (139 lb 3.2 oz)   SpO2: 100% 100%  94%    Intake/Output Summary (Last 24 hours) at 03/13/15 1234 Last data filed at 03/13/15 0900  Gross per 24 hour  Intake    820 ml  Output    400 ml  Net    420 ml   Filed Weights   03/11/15 0500 03/12/15 0500 03/13/15 0423  Weight: 67.45 kg (148 lb 11.2 oz) 62.596 kg (138 lb) 63.141 kg (139 lb 3.2 oz)    Examination: BP 122/73 mmHg  Pulse 84  Temp(Src) 97.6 F (36.4 C) (Oral)  Resp 20  Ht 5\' 4"  (1.626 m)  Wt 63.141 kg (139 lb 3.2 oz)  BMI 23.88 kg/m2  SpO2 94% General exam: grimacing with deep breathing  Respiratory system:  No wheezing Cardiovascular system: regular rate and rhythm  Gastrointestinal system: Abdomen is nondistended, soft and nontender. Normal bowel sounds heard. Central nervous system: AxOx3. No focal deficits Extremities:  No clubbing/cyanosis Skin: no rashes  Data Reviewed: I have personally reviewed following labs and imaging studies  CBC:  Recent Labs Lab 03/08/15 0120 03/09/15 0549 03/10/15 0721 03/11/15 0449 03/12/15 0547  WBC 9.9 12.9* 13.2* 9.0 7.3  NEUTROABS 8.5*  --   --   --   --   HGB 11.1* 10.1* 10.0* 9.3* 9.4*  HCT 36.0 33.8* 32.0* 30.4* 31.2*  MCV 80.5 79.9 77.9* 78.8 79.4  PLT 243 193 193 195 0000000   Basic Metabolic Panel:  Recent Labs Lab 03/09/15 0549 03/10/15 0721 03/11/15 0449 03/12/15 0547 03/13/15 0420  NA 134* 130*  132* 134* 133*  K 3.8 4.9 3.7 3.5 3.7  CL 90* 90* 90* 92* 92*  CO2 28 24 28 28 28   GLUCOSE 112* 85 88 85 86  BUN 49* 55* 53* 53* 44*  CREATININE 2.16* 2.36* 2.36* 2.20* 2.00*  CALCIUM 9.4 9.4 9.0 9.1 9.1   GFR: Estimated Creatinine Clearance: 19.1 mL/min (by C-G formula based on Cr of 2). Liver Function Tests:  Recent Labs Lab 03/08/15 0120 03/12/15 0547  AST 36 37  ALT 21 24  ALKPHOS 81 72  BILITOT 2.0* 1.6*  PROT 7.7 6.2*  ALBUMIN 4.0 2.6*   Coagulation Profile:  Recent Labs Lab 03/09/15 0549 03/10/15 0721 03/11/15 0449 03/12/15 0547 03/13/15 0420  INR 2.36* 3.27* 3.99* 3.93* 2.95*   Cardiac Enzymes:  Recent Labs Lab 03/08/15 0120 03/08/15 1834 03/09/15 0025 03/09/15 0549  TROPONINI 0.24* 0.41* 0.51* 0.44*   Urine analysis:    Component Value Date/Time   COLORURINE YELLOW 11/02/2014 1632   APPEARANCEUR CLOUDY* 11/02/2014 1632   LABSPEC 1.012 11/02/2014 1632   PHURINE 8.0 11/02/2014 1632   GLUCOSEU NEGATIVE 11/02/2014 1632   HGBUR LARGE* 11/02/2014 1632   BILIRUBINUR NEGATIVE 11/02/2014 1632   BILIRUBINUR n 10/29/2010 1421   KETONESUR NEGATIVE 11/02/2014 1632   PROTEINUR 30* 11/02/2014 1632   PROTEINUR trace 10/29/2010 1421   UROBILINOGEN 1.0 11/02/2014 1632   UROBILINOGEN 0.2 10/29/2010 1421   NITRITE POSITIVE* 11/02/2014 1632   NITRITE n 10/29/2010 1421   LEUKOCYTESUR LARGE* 11/02/2014 1632   Sepsis Labs: Invalid input(s): PROCALCITONIN, LACTICIDVEN  Recent Results (from the past 240 hour(s))  Blood culture (routine x 2)     Status: None (Preliminary result)   Collection Time: 03/08/15  1:20 AM  Result Value Ref Range Status   Specimen Description BLOOD LEFT ARM  Final   Special Requests BOTTLES DRAWN AEROBIC AND ANAEROBIC 5ML  Final   Culture   Final    NO GROWTH 4 DAYS [facility]    Report Status PENDING  Incomplete  Blood culture (routine x 2)     Status: None (Preliminary result)   Collection Time: 03/08/15   1:20 AM  Result Value Ref Range Status   Specimen Description BLOOD LEFT HAND  Final   Special Requests BOTTLES DRAWN AEROBIC AND ANAEROBIC 5ML  Final   Culture   Final    NO GROWTH 4 DAYS Performed at [facility]    Report Status PENDING  Incomplete      Radiology Studies: Dg Chest Port 1 View  03/12/2015  CLINICAL DATA:  Right lower lobe aspiration pneumonia, cough. EXAM: PORTABLE CHEST 1 VIEW COMPARISON:  Chest x-ray of 08 March 2015 and chest CT scan of 09 March 2015 FINDINGS: The left lung is hyperinflated but clear. On the right consolidation in the right perihilar region persists with mildly increased interstitial markings more peripherally.  There is a small right pleural effusion. The cardiac silhouette remains enlarged. The pulmonary vascularity remains engorged and indistinct. The pacemaker defibrillator is in stable position. The right-sided PICC line tip projects over the distal third of the SVC. IMPRESSION: There has been some interval worsening in pulmonary interstitial edema predominantly on the right. Confluent alveolar opacity on the right is consistent with pneumonia. There is a small right pleural effusion. Stable cardiomegaly. Electronically Signed   By: David  Martinique M.D.   On: 03/12/2015 07:40     Scheduled Meds: . cefTRIAXone (ROCEPHIN)  IV  1 g Intravenous Q24H  . dextromethorphan  30 mg Oral BID  . ezetimibe  10 mg Oral Q M,W,F  . furosemide  80 mg Intravenous BID  . levothyroxine  100 mcg Oral QHS  . pantoprazole  40 mg Oral Q1200  . potassium chloride  10 mEq Oral BID  . Warfarin - Pharmacist Dosing Inpatient   Does not apply q1800   Continuous Infusions: . DOBUTamine 5 mcg/kg/min (03/13/15 0708)    Marzetta Board, MD, PhD [facility] Pager [phone]  If 7PM-7AM, please contact night-coverage [email or web address] Password [facility] 03/13/2015, 12:34 PM

## 2015-03-13 NOTE — Progress Notes (Signed)
[facility]  Patient Status: Active pt with [facility]  Prior to this admission  [facility] is providing the following services: HHRN and home infusion pharmacy for home milrinone. [facility] team will follow Ms. Wagley while and inpatient.  [facility] infusion pharmacy provides the milrinone for Ms. Thrun when she is a resident at [facility].  [facility] will need to be notified prior to DC to coordinate and hook up ambulatory pump prior to DC from hospital to [facility].   If patient discharges after hours, please call [phone].   Larry Sierras 03/13/2015, 9:50 PM

## 2015-03-14 LAB — BASIC METABOLIC PANEL
ANION GAP: 10 (ref 5–15)
BUN: 41 mg/dL — ABNORMAL HIGH (ref 6–20)
CALCIUM: 9.2 mg/dL (ref 8.9–10.3)
CO2: 29 mmol/L (ref 22–32)
Chloride: 94 mmol/L — ABNORMAL LOW (ref 101–111)
Creatinine, Ser: 2.04 mg/dL — ABNORMAL HIGH (ref 0.44–1.00)
GFR, EST AFRICAN AMERICAN: 25 mL/min — AB (ref >60)
GFR, EST NON AFRICAN AMERICAN: 22 mL/min — AB (ref >60)
Glucose, Bld: 87 mg/dL (ref 65–99)
POTASSIUM: 3.9 mmol/L (ref 3.5–5.1)
SODIUM: 133 mmol/L — AB (ref 135–145)

## 2015-03-14 LAB — CBC
HCT: 33.1 % — ABNORMAL LOW (ref 36.0–46.0)
Hemoglobin: 9.7 g/dL — ABNORMAL LOW (ref 12.0–15.0)
MCH: 23.7 pg — ABNORMAL LOW (ref 26.0–34.0)
MCHC: 29.3 g/dL — ABNORMAL LOW (ref 30.0–36.0)
MCV: 80.9 fL (ref 78.0–100.0)
PLATELETS: 248 10*3/uL (ref 150–400)
RBC: 4.09 MIL/uL (ref 3.87–5.11)
RDW: 18.3 % — AB (ref 11.5–15.5)
WBC: 6.3 10*3/uL (ref 4.0–10.5)

## 2015-03-14 LAB — PROTIME-INR
INR: 2.57 — AB (ref 0.00–1.49)
Prothrombin Time: 27.2 seconds — ABNORMAL HIGH (ref 11.6–15.2)

## 2015-03-14 MED ORDER — DEXTROMETHORPHAN POLISTIREX ER 30 MG/5ML PO SUER: 30.0000 mg | Freq: Two times a day (BID) | ORAL | Status: DC

## 2015-03-14 MED ORDER — WARFARIN SODIUM 5 MG PO TABS: 2.5000 mg | ORAL_TABLET | ORAL | Status: DC

## 2015-03-14 MED ORDER — PANTOPRAZOLE SODIUM 40 MG PO TBEC: 40.0000 mg | DELAYED_RELEASE_TABLET | Freq: Every day | ORAL | Status: DC

## 2015-03-14 MED ORDER — SIMETHICONE 80 MG PO CHEW: 80.0000 mg | CHEWABLE_TABLET | Freq: Four times a day (QID) | ORAL | Status: AC | PRN

## 2015-03-14 MED ORDER — CLINDAMYCIN HCL 300 MG PO CAPS: 300.0000 mg | ORAL_CAPSULE | Freq: Four times a day (QID) | ORAL | Status: DC

## 2015-03-14 MED ORDER — TORSEMIDE 20 MG PO TABS
40.0000 mg | ORAL_TABLET | Freq: Two times a day (BID) | ORAL | Status: DC
  Administered 2015-03-14 – 2015-03-15 (×2): 40 mg via ORAL
  Filled 2015-03-14 (×2): qty 2

## 2015-03-14 MED ORDER — WARFARIN SODIUM 1 MG PO TABS
1.0000 mg | ORAL_TABLET | Freq: Once | ORAL | Status: AC
Start: 2015-03-14 — End: 2015-03-14
  Administered 2015-03-14: 1 mg via ORAL
  Filled 2015-03-14: qty 1

## 2015-03-14 NOTE — Progress Notes (Signed)
[facility]  Correction to note from 03-13-15  Sandra [name] was on home Dobutamine with [facility] NOT Milrinone.  Thank you.  If patient discharges after hours, please call [phone].   Larry Sierras 03/14/2015, 9:36 AM

## 2015-03-14 NOTE — Discharge Instructions (Signed)
Daily INR

## 2015-03-14 NOTE — Progress Notes (Signed)
Advanced Heart Failure Rounding Note   Subjective:    IV lasix started 03/11/15. Weight stable. I/Os inaccurate.   Diuresed weill with IV lasix. Feeling better. Hemoptysis slowing down.   INR 2.5   Objective:   Weight Range:  Vital Signs:   Temp:  [97.7 F (36.5 C)-98.7 F (37.1 C)] 98.7 F (37.1 C) (03/02 0430) Pulse Rate:  [70-84] 71 (03/02 0430) Resp:  [16-18] 18 (03/02 0430) BP: (108-135)/(64-76) 108/70 mmHg (03/02 0430) SpO2:  [94 %-98 %] 95 % (03/02 0430) Weight:  [63.368 kg (139 lb 11.2 oz)] 63.368 kg (139 lb 11.2 oz) (03/02 0430) Last BM Date: 03/14/15  Weight change: Filed Weights   03/12/15 0500 03/13/15 0423 03/14/15 0430  Weight: 62.596 kg (138 lb) 63.141 kg (139 lb 3.2 oz) 63.368 kg (139 lb 11.2 oz)    Intake/Output:   Intake/Output Summary (Last 24 hours) at 03/14/15 0842 Last data filed at 03/14/15 0400  Gross per 24 hour  Intake   1384 ml  Output    402 ml  Net    982 ml     Physical Exam: General: weak appearing. HEENT: normal  Neck: supple. JVP remains 6-7 Carotids 2+ bilat; no bruits. No thyromegaly or nodule noted.  Cor: PMI laterally displaced. regular  Lungs: Diminished with mild crackles RLL  Abdomen: soft, NT, +mildly distended, no HSM. No bruits or masses. +BS  Extremities: no cyanosis, clubbing, rash, no edema  Neuro: alert & oriented x 3, cranial nerves grossly intact. moves all 4 extremities w/o difficulty. Affect pleasant.  Skin: no rash.  Telemetry: Reviewed personally, AF with v-pacing  Labs: Basic Metabolic Panel:  Recent Labs Lab 03/10/15 0721 03/11/15 0449 03/12/15 0547 03/13/15 0420 03/14/15 0455  NA 130* 132* 134* 133* 133*  K 4.9 3.7 3.5 3.7 3.9  CL 90* 90* 92* 92* 94*  CO2 24 28 28 28 29   GLUCOSE 85 88 85 86 87  BUN 55* 53* 53* 44* 41*  CREATININE 2.36* 2.36* 2.20* 2.00* 2.04*  CALCIUM 9.4 9.0 9.1 9.1 9.2    Liver Function Tests:  Recent Labs Lab 03/08/15 0120 03/12/15 0547  AST 36 37  ALT 21  24  ALKPHOS 81 72  BILITOT 2.0* 1.6*  PROT 7.7 6.2*  ALBUMIN 4.0 2.6*   No results for input(s): LIPASE, AMYLASE in the last 168 hours. No results for input(s): AMMONIA in the last 168 hours.  CBC:  Recent Labs Lab 03/08/15 0120 03/09/15 0549 03/10/15 0721 03/11/15 0449 03/12/15 0547 03/14/15 0455  WBC 9.9 12.9* 13.2* 9.0 7.3 6.3  NEUTROABS 8.5*  --   --   --   --   --   HGB 11.1* 10.1* 10.0* 9.3* 9.4* 9.7*  HCT 36.0 33.8* 32.0* 30.4* 31.2* 33.1*  MCV 80.5 79.9 77.9* 78.8 79.4 80.9  PLT 243 193 193 195 204 248    Cardiac Enzymes:  Recent Labs Lab 03/08/15 0120 03/08/15 1834 03/09/15 0025 03/09/15 0549  TROPONINI 0.24* 0.41* 0.51* 0.44*    BNP: BNP (last 3 results)  Recent Labs  02/25/15 1115 03/08/15 0120  BNP 1364.1* 1414.9*    ProBNP (last 3 results) No results for input(s): PROBNP in the last 8760 hours.    Other results:  Imaging: No results found.   Medications:     Scheduled Medications: . cefTRIAXone (ROCEPHIN)  IV  1 g Intravenous Q24H  . dextromethorphan  30 mg Oral BID  . ezetimibe  10 mg Oral Q M,W,F  .  levothyroxine  100 mcg Oral QHS  . pantoprazole  40 mg Oral Q1200  . potassium chloride  10 mEq Oral BID  . sodium chloride flush  10-40 mL Intracatheter Q12H  . Warfarin - Pharmacist Dosing Inpatient   Does not apply q1800    Infusions: . DOBUTamine 5 mcg/kg/min (03/13/15 1900)    PRN Medications: acetaminophen, levalbuterol, ondansetron **OR** ondansetron (ZOFRAN) IV, simethicone, sodium chloride flush, traMADol   Assessment:   Much improved with diuresis. Renal function and electrolytes stable. Back to baseline from HF perspective. Can go home from our standpoint on previous home dobutamine and diuretic regimen. Will see back in HF Clinic in 2 weeks.   Length of Stay: 6   Glori Bickers MD 03/14/2015, 8:42 AM  Advanced Heart Failure Team Pager [phone] (M-F; [place])  Please contact [facility] for  night-coverage after hours (4p -7a ) and weekends on [email or web address]

## 2015-03-14 NOTE — Discharge Summary (Addendum)
Physician Discharge Summary  Sandra [name] MRN: [id number] DOB/AGE: 80-Apr-1934 49 y.o.  PCP: Garret Reddish, MD   Admit date: 03/08/2015 Discharge date: 03/14/2015  Discharge Diagnoses:     Principal Problem:   Aspiration pneumonia [facility]) Active Problems:   Hypothyroidism   Chronic kidney disease, stage III (moderate)   Chronic systolic heart failure (HCC)   Persistent atrial fibrillation (HCC)   Lobar pneumonia (HCC)   Acute on chronic renal failure (HCC)   Aspiration pneumonia of right middle lobe (HCC)   Aspiration into airway   Hemoptysis   Coagulopathy (HCC)   Acute on chronic systolic (congestive) heart failure ([place])  Addendum Discontinue discharge as patient complaining of back pain, chest pain ,nausea and desaturated  last night on RA , RN to assess o2 requirements  Follow-up recommendations Follow-up with PCP in 3-5 days , including all  additional recommended appointments as below Follow-up CBC, CMP in 3-5 days Strict aspiration precautions Daily INR, goal 2-3 Next  Speech therapy recommendations Diet recommendations: Regular;Thin liquid Liquids provided via: Cup;Straw Medication Administration: Whole meds with liquid Supervision: Patient able to self feed      Medication List    TAKE these medications        acetaminophen 325 MG tablet  Commonly known as:  TYLENOL  Take 650 mg by mouth every 6 (six) hours as needed for mild pain or headache. Reported on 03/07/2015     clindamycin 300 MG capsule  Commonly known as:  CLEOCIN  Take 1 capsule (300 mg total) by mouth 4 (four) times daily.     dextromethorphan 30 MG/5ML liquid  Commonly known as:  DELSYM  Take 5 mLs (30 mg total) by mouth 2 (two) times daily.     DOBUTamine 4-5 MG/ML-% infusion  Commonly known as:  DOBUTREX  Inject 330 mcg/min into the vein continuous.     ezetimibe 10 MG tablet  Commonly known as:  ZETIA  Take 10 mg by mouth every Monday, Wednesday, and Friday.      fluticasone 50 MCG/ACT nasal spray  Commonly known as:  FLONASE  Place 2 sprays into both nostrils daily.     levothyroxine 100 MCG tablet  Commonly known as:  SYNTHROID, LEVOTHROID  Take 100 mcg by mouth at bedtime.     pantoprazole 40 MG tablet  Commonly known as:  PROTONIX  Take 1 tablet (40 mg total) by mouth daily at 12 noon.     potassium chloride 10 MEQ tablet  Commonly known as:  K-DUR,KLOR-CON  Take 1 tablet (10 mEq total) by mouth 2 (two) times daily.     simethicone 80 MG chewable tablet  Commonly known as:  MYLICON  Chew 1 tablet (80 mg total) by mouth every 6 (six) hours as needed for flatulence.     torsemide 20 MG tablet  Commonly known as:  DEMADEX  Take 2 tablets (40 mg total) by mouth 2 (two) times daily.     triamcinolone cream 0.1 %  Commonly known as:  KENALOG  Apply 1 application topically 2 (two) times daily as needed (itching/rash).     VITAMIN E PO  Take 1 tablet by mouth daily.     warfarin 5 MG tablet  Commonly known as:  COUMADIN  Take 0.5-1 tablets (2.5-5 mg total) by mouth as directed. Take 2.5 mg (1/2 tablet) daily except 5 mg (1 tablet) on Monday  Start taking on:  03/15/2015         Discharge Condition:  Discharge Instructions   Discharge Instructions    Diet - low sodium heart healthy    Complete by:  As directed      Increase activity slowly    Complete by:  As directed            Allergies  Allergen Reactions  . Levofloxacin Other (See Comments)    Due to cardiac  AF  . Procaine Hcl Other (See Comments)    REACTION: smothers  . Spironolactone Diarrhea    Abdominal pain  . Amiodarone Nausea And Vomiting  . Tape Other (See Comments)    Tears skin off.  Please use "paper" tape only  . Amoxicillin Other (See Comments)    Rash   . Codeine Other (See Comments)    REACTION: nausea  . Niacin Other (See Comments)    REACTION: rash  . Ramipril Other (See Comments)    unknown  . Statins Other (See Comments)    REACTION:  muscle aches      Disposition: 01-Home or Self Care   Consults:  Pulmonary     Significant Diagnostic Studies:  Dg Chest 2 View  03/08/2015  CLINICAL DATA:  Acute onset of shortness of breath and cough. Back pain. Initial encounter. EXAM: CHEST  2 VIEW COMPARISON:  Chest radiograph performed 03/07/2015 FINDINGS: Rapidly increasing right midlung airspace opacity is compatible with pneumonia. No pleural effusion or pneumothorax is seen. The cardiomediastinal silhouette is enlarged. A pacemaker/AICD is noted at the left chest wall, with leads ending at the right atrium, right ventricle and coronary sinus. A right PICC is noted ending at the proximal right atrium. No acute osseous abnormalities are seen. IMPRESSION: 1. Worsening right midlung opacity, compatible with pneumonia. 2. Cardiomegaly. Electronically Signed   By: Garald Balding M.D.   On: 03/08/2015 02:43   Dg Chest 2 View  03/07/2015  CLINICAL DATA:  Right-sided chest pain and hemoptysis for 2 days. EXAM: CHEST  2 VIEW COMPARISON:  Chest x-ray 02/06/2015 FINDINGS: The heart is enlarged but stable. There is tortuosity and calcification of the thoracic aorta. The pacer wires are stable. Right perihilar density is most likely pneumonia. It was not present on the prior chest x-ray. No pleural effusion. The left lung is clear. The bony thorax is intact. IMPRESSION: Right perihilar infiltrate likely in the superior segment of the right lower lobe based on the lateral film. Followup PA and lateral chest X-ray is recommended in 3-4 weeks following trial of antibiotic therapy to ensure resolution and exclude underlying malignancy. Electronically Signed   By: Marijo Sanes M.D.   On: 03/07/2015 16:04   Ct Chest Wo Contrast  03/09/2015  CLINICAL DATA:  Hemoptysis. Evaluate for aspiration pneumonia. Cough, fever. EXAM: CT CHEST WITHOUT CONTRAST TECHNIQUE: Multidetector CT imaging of the chest was performed following the standard protocol without IV  contrast. COMPARISON:  03/08/2015 FINDINGS: There is a small right pleural effusion. Consolidation noted throughout much of the right lower lobe, most confluent in the superior segment. This is most compatible with pneumonia. Aspiration pneumonia is possible. Left lung is clear except for minimal atelectasis or scarring at the left base. There is cardiomegaly. Small pericardial effusion. Left ICD device in place. Aortic calcifications without aneurysm. Mild mediastinal adenopathy. Precarinal lymph node has a short axis diameter of 20 mm. Right paratracheal lymph node has a short axis diameter of 10 mm. No visible hilar or axillary adenopathy. Imaging into the upper abdomen shows no acute findings. No acute bony abnormality or focal  bone lesion. IMPRESSION: Consolidation throughout much of the right lower lobe, most confluent in the superior segment compatible with pneumonia. Aspiration is not excluded. Mild mediastinal adenopathy which may be reactive. This could be followed after treatment and resolution of acute symptoms, in 3-6 months to assure stability or resolution. Cardiomegaly. Small right pleural effusion and pericardial effusion. Electronically Signed   By: Rolm Baptise M.D.   On: 03/09/2015 09:41   Dg Chest Port 1 View  03/12/2015  CLINICAL DATA:  Right lower lobe aspiration pneumonia, cough. EXAM: PORTABLE CHEST 1 VIEW COMPARISON:  Chest x-ray of 08 March 2015 and chest CT scan of 09 March 2015 FINDINGS: The left lung is hyperinflated but clear. On the right consolidation in the right perihilar region persists with mildly increased interstitial markings more peripherally. There is a small right pleural effusion. The cardiac silhouette remains enlarged. The pulmonary vascularity remains engorged and indistinct. The pacemaker defibrillator is in stable position. The right-sided PICC line tip projects over the distal third of the SVC. IMPRESSION: There has been some interval worsening in pulmonary  interstitial edema predominantly on the right. Confluent alveolar opacity on the right is consistent with pneumonia. There is a small right pleural effusion. Stable cardiomegaly. Electronically Signed   By: David  Martinique M.D.   On: 03/12/2015 07:40        Filed Weights   03/12/15 0500 03/13/15 0423 03/14/15 0430  Weight: 62.596 kg (138 lb) 63.141 kg (139 lb 3.2 oz) 63.368 kg (139 lb 11.2 oz)     Microbiology: Recent Results (from the past 240 hour(s))  Blood culture (routine x 2)     Status: None   Collection Time: 03/08/15  1:20 AM  Result Value Ref Range Status   Specimen Description BLOOD LEFT ARM  Final   Special Requests BOTTLES DRAWN AEROBIC AND ANAEROBIC 5ML  Final   Culture   Final    NO GROWTH 5 DAYS Performed at [facility]    Report Status 03/13/2015 FINAL  Final  Blood culture (routine x 2)     Status: None   Collection Time: 03/08/15  1:20 AM  Result Value Ref Range Status   Specimen Description BLOOD LEFT HAND  Final   Special Requests BOTTLES DRAWN AEROBIC AND ANAEROBIC 5ML  Final   Culture   Final    NO GROWTH 5 DAYS Performed at [facility]    Report Status 03/13/2015 FINAL  Final       Blood Culture    Component Value Date/Time   SDES BLOOD LEFT ARM 03/08/2015 0120   SDES BLOOD LEFT HAND 03/08/2015 0120   SPECREQUEST BOTTLES DRAWN AEROBIC AND ANAEROBIC 5ML 03/08/2015 0120   SPECREQUEST BOTTLES DRAWN AEROBIC AND ANAEROBIC 5ML 03/08/2015 0120   CULT  03/08/2015 0120    NO GROWTH 5 DAYS Performed at [facility]  03/08/2015 0120    NO GROWTH 5 DAYS Performed at [facility] 03/13/2015 FINAL 03/08/2015 0120   REPTSTATUS 03/13/2015 FINAL 03/08/2015 0120      Labs: Results for orders placed or performed during the hospital encounter of 03/08/15 (from the past 48 hour(s))  Protime-INR     Status: Abnormal   Collection Time: 03/13/15  4:20 AM  Result Value Ref Range   Prothrombin Time 30.2  (H) 11.6 - 15.2 seconds   INR 2.95 (H) 0.00 - 2.63  Basic metabolic panel     Status: Abnormal   Collection  Time: 03/13/15  4:20 AM  Result Value Ref Range   Sodium 133 (L) 135 - 145 mmol/L   Potassium 3.7 3.5 - 5.1 mmol/L   Chloride 92 (L) 101 - 111 mmol/L   CO2 28 22 - 32 mmol/L   Glucose, Bld 86 65 - 99 mg/dL   BUN 44 (H) 6 - 20 mg/dL   Creatinine, Ser 2.00 (H) 0.44 - 1.00 mg/dL   Calcium 9.1 8.9 - 10.3 mg/dL   GFR calc non Af Amer 22 (L) >60 mL/min   GFR calc Af Amer 26 (L) >60 mL/min    Comment: (NOTE) The eGFR has been calculated using the CKD EPI equation. This calculation has not been validated in all clinical situations. eGFR's persistently <60 mL/min signify possible Chronic Kidney Disease.    Anion gap 13 5 - 15  Protime-INR     Status: Abnormal   Collection Time: 03/14/15  4:55 AM  Result Value Ref Range   Prothrombin Time 27.2 (H) 11.6 - 15.2 seconds   INR 2.57 (H) 0.00 - 1.49  CBC     Status: Abnormal   Collection Time: 03/14/15  4:55 AM  Result Value Ref Range   WBC 6.3 4.0 - 10.5 K/uL   RBC 4.09 3.87 - 5.11 MIL/uL   Hemoglobin 9.7 (L) 12.0 - 15.0 g/dL   HCT 33.1 (L) 36.0 - 46.0 %   MCV 80.9 78.0 - 100.0 fL   MCH 23.7 (L) 26.0 - 34.0 pg   MCHC 29.3 (L) 30.0 - 36.0 g/dL   RDW 18.3 (H) 11.5 - 15.5 %   Platelets 248 150 - 400 K/uL  Basic metabolic panel     Status: Abnormal   Collection Time: 03/14/15  4:55 AM  Result Value Ref Range   Sodium 133 (L) 135 - 145 mmol/L   Potassium 3.9 3.5 - 5.1 mmol/L   Chloride 94 (L) 101 - 111 mmol/L   CO2 29 22 - 32 mmol/L   Glucose, Bld 87 65 - 99 mg/dL   BUN 41 (H) 6 - 20 mg/dL   Creatinine, Ser 2.04 (H) 0.44 - 1.00 mg/dL   Calcium 9.2 8.9 - 10.3 mg/dL   GFR calc non Af Amer 22 (L) >60 mL/min   GFR calc Af Amer 25 (L) >60 mL/min    Comment: (NOTE) The eGFR has been calculated using the CKD EPI equation. This calculation has not been validated in all clinical situations. eGFR's persistently <60 mL/min signify  possible Chronic Kidney Disease.    Anion gap 10 5 - 15     Lipid Panel     Component Value Date/Time   CHOL 231* 08/04/2011 1045   TRIG 220.0* 08/04/2011 1045   HDL 42.30 08/04/2011 1045   CHOLHDL 5 08/04/2011 1045   VLDL 44.0* 08/04/2011 1045   LDLCALC 124* 06/24/2009 0924   LDLDIRECT 124.9 10/16/2013 1526     No results found for: HGBA1C   Lab Results  Component Value Date   LDLCALC 124* 06/24/2009   CREATININE 2.04* 03/14/2015     HPI :*82yo female with hx chronic sCHF/NICM with EF 25% on chronic outpt dobutamine, AFib on coumadin s/p recent AV node ablation (02/06/15) admitted 2/27 with aspiration PNA after "choking" episode at home. She was started on IV abx and followed closely by heart failure team. On 2/27 she developed increased SOB, cough and mild hemoptysis and PCCM consulted.   HOSPITAL COURSE: Aspiration pneumonia  - Following choking incident a few days prior to  presentation Patient treated with Zosyn and subsequently Rocephin, now switched to clindamycin times another 4 days. Patient has been on antibiotics since 2/24 - Follow-up blood cultures, no growth so far - SLP evaluation without signs of aspiration, recommendations as above - CT chest 2/25 with RLL consolidation compatible with pneumonia.  - hemoptysis stable, improving, pulm consulted and recommended to resume Coumadin  Chronic systolic CHF/ low output state on chronic dobutamine - EF is 25%, Clinically compensated, seen by cardiology today - Continue dobutamine , patient was followed by heart failure team Back to baseline from HF perspective. Can go home from cardiology standpoint  Follow-up with CHF clinic in 2 weeks  Elevated Troponin - no symptoms of ACS - suspect due to demand/pneumonia/CKD - no CAD based on cards notes  Persistent atrial fibrillation INR supratherapeutic and went up to 3.99 in the setting of aspiration pneumonia and hemoptysis, Coumadin was held for several days  but then resumed prior to discharge INR 2.57 on the day of discharge Please check daily INR  Hypothyroidism - Continue Synthroid  Chronic kidney disease, stage IV - Creatinine stable at baseline 2.0 - Diuretics per heart failure team    Discharge Exam:   Blood pressure 111/63, pulse 72, temperature 98.3 F (36.8 C), temperature source Oral, resp. rate 16, height '5\' 4"'$  (1.626 m), weight 63.368 kg (139 lb 11.2 oz), SpO2 96 %.   General exam: grimacing with deep breathing  Respiratory system: No wheezing Cardiovascular system: regular rate and rhythm  Gastrointestinal system: Abdomen is nondistended, soft and nontender. Normal bowel sounds heard. Central nervous system: AxOx3. No focal deficits Extremities: No clubbing/cyanosis Skin: no rashes   Follow-up Information    Follow up with [facility] .   Specialty:  [place] information:   [facility] [place] [phone]      Follow up with [facility] On 03/19/2015.   Specialty:  Cardiology   Why:  at 1000 am for post hospital follow up. Please bring all of your medications to your visit. The code for patient parking is 0010.   Contact information:   [address] [id number] [place] [phone]      Follow up with Garret Reddish, MD. Schedule an appointment as soon as possible for a visit in 3 days.   Specialty:  Family Medicine   Contact information:   [place] [id number] [phone]       Signed: Reyne Dumas 03/14/2015, 12:35 PM        Time spent >45 mins

## 2015-03-14 NOTE — [place] Notes (Signed)
ANTICOAGULATION CONSULT NOTE - Follow Up Consult  Pharmacy Consult for Coumadin Indication: atrial fibrillation  Allergies  Allergen Reactions  . Levofloxacin Other (See Comments)    Due to cardiac  AF  . Procaine Hcl Other (See Comments)    REACTION: smothers  . Spironolactone Diarrhea    Abdominal pain  . Amiodarone Nausea And Vomiting  . Tape Other (See Comments)    Tears skin off.  Please use "paper" tape only  . Amoxicillin Other (See Comments)    Rash   . Codeine Other (See Comments)    REACTION: nausea  . Niacin Other (See Comments)    REACTION: rash  . Ramipril Other (See Comments)    unknown  . Statins Other (See Comments)    REACTION: muscle aches    Patient Measurements: Height: 5\' 4"  (162.6 cm) Weight: 139 lb 11.2 oz (63.368 kg) IBW/kg (Calculated) : 54.7  Vital Signs: Temp: 98.3 F (36.8 C) (03/02 0842) Temp Source: Oral (03/02 0842) BP: 111/63 mmHg (03/02 0842) Pulse Rate: 72 (03/02 0842)  Labs:  Recent Labs  03/12/15 0547 03/13/15 0420 03/14/15 0455  HGB 9.4*  --  9.7*  HCT 31.2*  --  33.1*  PLT 204  --  248  LABPROT 37.5* 30.2* 27.2*  INR 3.93* 2.95* 2.57*  CREATININE 2.20* 2.00* 2.04*    Estimated Creatinine Clearance: 18.7 mL/min (by C-G formula based on Cr of 2.04).  Assessment: 82 yo F on Coumadin PTA for hx afib.  INR continues to trend down to 2.5 today, continues to be at goal. LFTs wnl, CBC remains stable, hemoptysis improving.  Restart low dose warfarin tonight.  PTA warfarin dose was 2.5mg  daily except 5mg  on Monday  Goal of Therapy:  INR 2-3 Monitor platelets by anticoagulation protocol: Yes   Plan:   Will give low dose warfarin 1mg  tonight, likely able to restart home dose of warfarin tomorrow 3/3 Daily INR  Erin Hearing PharmD., BCPS Clinical Pharmacist Pager [phone] 03/14/2015 10:40 AM

## 2015-03-14 NOTE — Clinical Social Work Note (Signed)
Patient has a bed at [facility] SN[facility]. Family completing paperwork at the facility at 3:00PM. CSW will assist with DC once patient has been discharged.  Liz Beach MSW, [place], [place], [phone]

## 2015-03-14 NOTE — Care Management Important Message (Signed)
Important Message  Patient Details  Name: Sandra [name] MRN: [id number] Date of Birth: 02-Jan-1933   Medicare Important Message Given:  Yes    [name] Abena 03/14/2015, 1:45 PM

## 2015-03-14 NOTE — Discharge Summary (Deleted)
Physician Discharge Summary  Sandra [name] MRN: [id number] DOB/AGE: 16-Apr-1932 82 y.o.  PCP: Garret Reddish, MD   Admit date: 03/08/2015 Discharge date: 03/14/2015  Discharge Diagnoses:     Principal Problem:   Aspiration pneumonia [facility]) Active Problems:   Hypothyroidism   Chronic kidney disease, stage III (moderate)   Chronic systolic heart failure (HCC)   Persistent atrial fibrillation (HCC)   Lobar pneumonia (HCC)   Acute on chronic renal failure (HCC)   Aspiration pneumonia of right middle lobe (HCC)   Aspiration into airway   Hemoptysis   Coagulopathy (HCC)   Acute on chronic systolic (congestive) heart failure ([place])    Follow-up recommendations Follow-up with PCP in 3-5 days , including all  additional recommended appointments as below Follow-up CBC, CMP in 3-5 days Strict aspiration precautions Daily INR, goal 2-3 Next  Speech therapy recommendations Diet recommendations: Regular;Thin liquid Liquids provided via: Cup;Straw Medication Administration: Whole meds with liquid Supervision: Patient able to self feed      Medication List    TAKE these medications        acetaminophen 325 MG tablet  Commonly known as:  TYLENOL  Take 650 mg by mouth every 6 (six) hours as needed for mild pain or headache. Reported on 03/07/2015     clindamycin 300 MG capsule  Commonly known as:  CLEOCIN  Take 1 capsule (300 mg total) by mouth 4 (four) times daily.     dextromethorphan 30 MG/5ML liquid  Commonly known as:  DELSYM  Take 5 mLs (30 mg total) by mouth 2 (two) times daily.     DOBUTamine 4-5 MG/ML-% infusion  Commonly known as:  DOBUTREX  Inject 330 mcg/min into the vein continuous.     ezetimibe 10 MG tablet  Commonly known as:  ZETIA  Take 10 mg by mouth every Monday, Wednesday, and Friday.     fluticasone 50 MCG/ACT nasal spray  Commonly known as:  FLONASE  Place 2 sprays into both nostrils daily.     levothyroxine 100 MCG tablet  Commonly known  as:  SYNTHROID, LEVOTHROID  Take 100 mcg by mouth at bedtime.     pantoprazole 40 MG tablet  Commonly known as:  PROTONIX  Take 1 tablet (40 mg total) by mouth daily at 12 noon.     potassium chloride 10 MEQ tablet  Commonly known as:  K-DUR,KLOR-CON  Take 1 tablet (10 mEq total) by mouth 2 (two) times daily.     simethicone 80 MG chewable tablet  Commonly known as:  MYLICON  Chew 1 tablet (80 mg total) by mouth every 6 (six) hours as needed for flatulence.     torsemide 20 MG tablet  Commonly known as:  DEMADEX  Take 2 tablets (40 mg total) by mouth 2 (two) times daily.     triamcinolone cream 0.1 %  Commonly known as:  KENALOG  Apply 1 application topically 2 (two) times daily as needed (itching/rash).     VITAMIN E PO  Take 1 tablet by mouth daily.     warfarin 5 MG tablet  Commonly known as:  COUMADIN  Take 2.5-5 mg by mouth as directed. Take 2.5 mg (1/2 tablet) daily except 5 mg (1 tablet) on Monday         Discharge Condition:   Discharge Instructions       Discharge Instructions    Diet - low sodium heart healthy    Complete by:  As directed      Increase  activity slowly    Complete by:  As directed            Allergies  Allergen Reactions  . Levofloxacin Other (See Comments)    Due to cardiac  AF  . Procaine Hcl Other (See Comments)    REACTION: smothers  . Spironolactone Diarrhea    Abdominal pain  . Amiodarone Nausea And Vomiting  . Tape Other (See Comments)    Tears skin off.  Please use "paper" tape only  . Amoxicillin Other (See Comments)    Rash   . Codeine Other (See Comments)    REACTION: nausea  . Niacin Other (See Comments)    REACTION: rash  . Ramipril Other (See Comments)    unknown  . Statins Other (See Comments)    REACTION: muscle aches      Disposition: 01-Home or Self Care   Consults:  Pulmonary     Significant Diagnostic Studies:  Dg Chest 2 View  03/08/2015  CLINICAL DATA:  Acute onset of shortness of  breath and cough. Back pain. Initial encounter. EXAM: CHEST  2 VIEW COMPARISON:  Chest radiograph performed 03/07/2015 FINDINGS: Rapidly increasing right midlung airspace opacity is compatible with pneumonia. No pleural effusion or pneumothorax is seen. The cardiomediastinal silhouette is enlarged. A pacemaker/AICD is noted at the left chest wall, with leads ending at the right atrium, right ventricle and coronary sinus. A right PICC is noted ending at the proximal right atrium. No acute osseous abnormalities are seen. IMPRESSION: 1. Worsening right midlung opacity, compatible with pneumonia. 2. Cardiomegaly. Electronically Signed   By: Roanna Raider M.D.   On: 03/08/2015 02:43   Dg Chest 2 View  03/07/2015  CLINICAL DATA:  Right-sided chest pain and hemoptysis for 2 days. EXAM: CHEST  2 VIEW COMPARISON:  Chest x-ray 02/06/2015 FINDINGS: The heart is enlarged but stable. There is tortuosity and calcification of the thoracic aorta. The pacer wires are stable. Right perihilar density is most likely pneumonia. It was not present on the prior chest x-ray. No pleural effusion. The left lung is clear. The bony thorax is intact. IMPRESSION: Right perihilar infiltrate likely in the superior segment of the right lower lobe based on the lateral film. Followup PA and lateral chest X-ray is recommended in 3-4 weeks following trial of antibiotic therapy to ensure resolution and exclude underlying malignancy. Electronically Signed   By: Rudie Meyer M.D.   On: 03/07/2015 16:04   Ct Chest Wo Contrast  03/09/2015  CLINICAL DATA:  Hemoptysis. Evaluate for aspiration pneumonia. Cough, fever. EXAM: CT CHEST WITHOUT CONTRAST TECHNIQUE: Multidetector CT imaging of the chest was performed following the standard protocol without IV contrast. COMPARISON:  03/08/2015 FINDINGS: There is a small right pleural effusion. Consolidation noted throughout much of the right lower lobe, most confluent in the superior segment. This is most  compatible with pneumonia. Aspiration pneumonia is possible. Left lung is clear except for minimal atelectasis or scarring at the left base. There is cardiomegaly. Small pericardial effusion. Left ICD device in place. Aortic calcifications without aneurysm. Mild mediastinal adenopathy. Precarinal lymph node has a short axis diameter of 20 mm. Right paratracheal lymph node has a short axis diameter of 10 mm. No visible hilar or axillary adenopathy. Imaging into the upper abdomen shows no acute findings. No acute bony abnormality or focal bone lesion. IMPRESSION: Consolidation throughout much of the right lower lobe, most confluent in the superior segment compatible with pneumonia. Aspiration is not excluded. Mild mediastinal adenopathy which may  be reactive. This could be followed after treatment and resolution of acute symptoms, in 3-6 months to assure stability or resolution. Cardiomegaly. Small right pleural effusion and pericardial effusion. Electronically Signed   By: Rolm Baptise M.D.   On: 03/09/2015 09:41   Dg Chest Port 1 View  03/12/2015  CLINICAL DATA:  Right lower lobe aspiration pneumonia, cough. EXAM: PORTABLE CHEST 1 VIEW COMPARISON:  Chest x-ray of 08 March 2015 and chest CT scan of 09 March 2015 FINDINGS: The left lung is hyperinflated but clear. On the right consolidation in the right perihilar region persists with mildly increased interstitial markings more peripherally. There is a small right pleural effusion. The cardiac silhouette remains enlarged. The pulmonary vascularity remains engorged and indistinct. The pacemaker defibrillator is in stable position. The right-sided PICC line tip projects over the distal third of the SVC. IMPRESSION: There has been some interval worsening in pulmonary interstitial edema predominantly on the right. Confluent alveolar opacity on the right is consistent with pneumonia. There is a small right pleural effusion. Stable cardiomegaly. Electronically  Signed   By: David  Martinique M.D.   On: 03/12/2015 07:40        Filed Weights   03/12/15 0500 03/13/15 0423 03/14/15 0430  Weight: 62.596 kg (138 lb) 63.141 kg (139 lb 3.2 oz) 63.368 kg (139 lb 11.2 oz)     Microbiology: Recent Results (from the past 240 hour(s))  Blood culture (routine x 2)     Status: None   Collection Time: 03/08/15  1:20 AM  Result Value Ref Range Status   Specimen Description BLOOD LEFT ARM  Final   Special Requests BOTTLES DRAWN AEROBIC AND ANAEROBIC 5ML  Final   Culture   Final    NO GROWTH 5 DAYS Performed at [facility]    Report Status 03/13/2015 FINAL  Final  Blood culture (routine x 2)     Status: None   Collection Time: 03/08/15  1:20 AM  Result Value Ref Range Status   Specimen Description BLOOD LEFT HAND  Final   Special Requests BOTTLES DRAWN AEROBIC AND ANAEROBIC 5ML  Final   Culture   Final    NO GROWTH 5 DAYS Performed at [facility]    Report Status 03/13/2015 FINAL  Final       Blood Culture    Component Value Date/Time   SDES BLOOD LEFT ARM 03/08/2015 0120   SDES BLOOD LEFT HAND 03/08/2015 0120   SPECREQUEST BOTTLES DRAWN AEROBIC AND ANAEROBIC 5ML 03/08/2015 0120   SPECREQUEST BOTTLES DRAWN AEROBIC AND ANAEROBIC 5ML 03/08/2015 0120   CULT  03/08/2015 0120    NO GROWTH 5 DAYS Performed at [place]  03/08/2015 0120    NO GROWTH 5 DAYS Performed at [facility] 03/13/2015 FINAL 03/08/2015 0120   REPTSTATUS 03/13/2015 FINAL 03/08/2015 0120      Labs: Results for orders placed or performed during the hospital encounter of 03/08/15 (from the past 48 hour(s))  Protime-INR     Status: Abnormal   Collection Time: 03/13/15  4:20 AM  Result Value Ref Range   Prothrombin Time 30.2 (H) 11.6 - 15.2 seconds   INR 2.95 (H) 0.00 - 6.28  Basic metabolic panel     Status: Abnormal   Collection Time: 03/13/15  4:20 AM  Result Value Ref Range   Sodium 133 (L) 135 - 145 mmol/L    Potassium 3.7 3.5 - 5.1 mmol/L  Chloride 92 (L) 101 - 111 mmol/L   CO2 28 22 - 32 mmol/L   Glucose, Bld 86 65 - 99 mg/dL   BUN 44 (H) 6 - 20 mg/dL   Creatinine, Ser 2.00 (H) 0.44 - 1.00 mg/dL   Calcium 9.1 8.9 - 10.3 mg/dL   GFR calc non Af Amer 22 (L) >60 mL/min   GFR calc Af Amer 26 (L) >60 mL/min    Comment: (NOTE) The eGFR has been calculated using the CKD EPI equation. This calculation has not been validated in all clinical situations. eGFR's persistently <60 mL/min signify possible Chronic Kidney Disease.    Anion gap 13 5 - 15  Protime-INR     Status: Abnormal   Collection Time: 03/14/15  4:55 AM  Result Value Ref Range   Prothrombin Time 27.2 (H) 11.6 - 15.2 seconds   INR 2.57 (H) 0.00 - 1.49  CBC     Status: Abnormal   Collection Time: 03/14/15  4:55 AM  Result Value Ref Range   WBC 6.3 4.0 - 10.5 K/uL   RBC 4.09 3.87 - 5.11 MIL/uL   Hemoglobin 9.7 (L) 12.0 - 15.0 g/dL   HCT 33.1 (L) 36.0 - 46.0 %   MCV 80.9 78.0 - 100.0 fL   MCH 23.7 (L) 26.0 - 34.0 pg   MCHC 29.3 (L) 30.0 - 36.0 g/dL   RDW 18.3 (H) 11.5 - 15.5 %   Platelets 248 150 - 400 K/uL  Basic metabolic panel     Status: Abnormal   Collection Time: 03/14/15  4:55 AM  Result Value Ref Range   Sodium 133 (L) 135 - 145 mmol/L   Potassium 3.9 3.5 - 5.1 mmol/L   Chloride 94 (L) 101 - 111 mmol/L   CO2 29 22 - 32 mmol/L   Glucose, Bld 87 65 - 99 mg/dL   BUN 41 (H) 6 - 20 mg/dL   Creatinine, Ser 2.04 (H) 0.44 - 1.00 mg/dL   Calcium 9.2 8.9 - 10.3 mg/dL   GFR calc non Af Amer 22 (L) >60 mL/min   GFR calc Af Amer 25 (L) >60 mL/min    Comment: (NOTE) The eGFR has been calculated using the CKD EPI equation. This calculation has not been validated in all clinical situations. eGFR's persistently <60 mL/min signify possible Chronic Kidney Disease.    Anion gap 10 5 - 15     Lipid Panel     Component Value Date/Time   CHOL 231* 08/04/2011 1045   TRIG 220.0* 08/04/2011 1045   HDL 42.30 08/04/2011 1045    CHOLHDL 5 08/04/2011 1045   VLDL 44.0* 08/04/2011 1045   LDLCALC 124* 06/24/2009 0924   LDLDIRECT 124.9 10/16/2013 1526     No results found for: HGBA1C   Lab Results  Component Value Date   LDLCALC 124* 06/24/2009   CREATININE 2.04* 03/14/2015     HPI :*82yo female with hx chronic sCHF/NICM with EF 25% on chronic outpt dobutamine, AFib on coumadin s/p recent AV node ablation (02/06/15) admitted 2/27 with aspiration PNA after "choking" episode at home. She was started on IV abx and followed closely by heart failure team. On 2/27 she developed increased SOB, cough and mild hemoptysis and PCCM consulted.   HOSPITAL COURSE: Aspiration pneumonia  - Following choking incident a few days prior to presentation Patient treated with Zosyn and subsequently Rocephin, now switched to clindamycin times another 4 days. Patient has been on antibiotics since 2/24 - Follow-up blood cultures, no growth  so far - SLP evaluation without signs of aspiration, recommendations as above - CT chest 2/25 with RLL consolidation compatible with pneumonia.  - hemoptysis stable, improving, pulm consulted and recommended to resume Coumadin  Chronic systolic CHF/ low output state on chronic dobutamine - EF is 25%, Clinically compensated, seen by cardiology today - Continue dobutamine , patient was followed by heart failure team Back to baseline from HF perspective. Can go home from cardiology standpoint  Follow-up with CHF clinic in 2 weeks  Elevated Troponin - no symptoms of ACS - suspect due to demand/pneumonia/CKD - no CAD based on cards notes  Persistent atrial fibrillation INR supratherapeutic and went up to 3.99 in the setting of aspiration pneumonia and hemoptysis, Coumadin was held for several days but then resumed prior to discharge INR 2.57 on the day of discharge Please check daily INR  Hypothyroidism - Continue Synthroid  Chronic kidney disease, stage IV - Creatinine stable at  baseline 2.0 - Diuretics per heart failure team    Discharge Exam:   Blood pressure 111/63, pulse 72, temperature 98.3 F (36.8 C), temperature source Oral, resp. rate 16, height '5\' 4"'$  (1.626 m), weight 63.368 kg (139 lb 11.2 oz), SpO2 96 %.   General exam: grimacing with deep breathing  Respiratory system: No wheezing Cardiovascular system: regular rate and rhythm  Gastrointestinal system: Abdomen is nondistended, soft and nontender. Normal bowel sounds heard. Central nervous system: AxOx3. No focal deficits Extremities: No clubbing/cyanosis Skin: no rashes   Follow-up Information    Follow up with [facility] .   Specialty:  [place] information:   [place] [phone]      Follow up with [place] On 03/19/2015.   Specialty:  Cardiology   Why:  at 1000 am for post hospital follow up. Please bring all of your medications to your visit. The code for patient parking is 0010.   Contact information:   [address] [id number] [place] [phone]      Follow up with Garret Reddish, MD. Schedule an appointment as soon as possible for a visit in 3 days.   Specialty:  Family Medicine   Contact information:   [place] [address] [phone]       Signed: Reyne Dumas 03/14/2015, 12:28 PM        Time spent >45 mins

## 2015-03-15 LAB — PROTIME-INR
INR: 2.14 — ABNORMAL HIGH (ref 0.00–1.49)
Prothrombin Time: 23.7 seconds — ABNORMAL HIGH (ref 11.6–15.2)

## 2015-03-15 NOTE — Progress Notes (Signed)
Pam from advanced setting up pt's dobutamine drip for transport. Pt belongings with daughter. Pt and family have no further questions. VSS. Pt will be transfering via daughter by vehicle to [facility].

## 2015-03-15 NOTE — Progress Notes (Signed)
Physical Therapy Treatment Patient Details Name: Sandra [name] MRN: [id number] DOB: 1932-11-05 Today's Date: 03/15/2015    History of Present Illness Pt is a 82 y/o F w/ severe aspiration pneumonia.  Pt's PMH includes chronic a-fib, chronic systolic CHF w/ low output state on chronic dobutamine, CKD, RBB, gout, anxiety.    PT Comments    Pt to d/c to SNF today.  Pt remains resistant to use RW for ambulation to improve balance and assist with energy conservation.    Follow Up Recommendations  SNF (Pt to d/c to SNF today.  )     Equipment Recommendations       Recommendations for Other Services       Precautions / Restrictions Precautions Precautions: Fall Precaution Comments: watch O2; do not use bed pads, use cotton instead Restrictions Weight Bearing Restrictions: No    Mobility  Bed Mobility Overal bed mobility: Modified Independent       Supine to sit: Modified independent (Device/Increase time)     General bed mobility comments: HOB elevated no assist required.  Use of rails.    Transfers Overall transfer level: Needs assistance Equipment used: None (remains to refuse RW despite education on improve safety with use RW.  ) Transfers: Sit to/from Stand Sit to Stand: Supervision         General transfer comment: Pt stood impulsively without assistance.   Ambulation/Gait Ambulation/Gait assistance: Min assist Ambulation Distance (Feet): 160 Feet Assistive device: None (remains to refuse RW depsite education.  )   Gait velocity: slow   General Gait Details: Pt intermittently furniture walking, grabbing to rails and bed frame.  Pt required cues for reciprocal armswing to improve balance.  Pt required x 2rest breaks secondary to fatigue.  LOB x2.  Pt would benefit from RW next visit.     Stairs            Wheelchair Mobility    Modified Rankin (Stroke Patients Only)       Balance     Sitting balance-Leahy Scale: Good       Standing  balance-Leahy Scale: Fair                      Cognition Arousal/Alertness: Awake/alert Behavior During Therapy: Impulsive (remains to stand before prompted.  )   Area of Impairment: Safety/judgement         Safety/Judgement: Decreased awareness of deficits;Decreased awareness of safety          Exercises      General Comments        Pertinent Vitals/Pain Pain Assessment: No/denies pain    Home Living                      Prior Function            PT Goals (current goals can now be found in the care plan section) Acute Rehab PT Goals Patient Stated Goal: to feel better Potential to Achieve Goals: Good    Frequency  Min 3X/week    PT Plan      Co-evaluation             End of Session Equipment Utilized During Treatment: Gait belt Activity Tolerance: Patient limited by fatigue Patient left: in chair;with family/visitor present     Time: [id number] PT Time Calculation (min) (ACUTE ONLY): 13 min  Charges:  $Gait Training: 8-22 mins  G Codes:      Cristela Blue 03/15/2015, 2:39 PM Governor Rooks, PTA pager [phone]

## 2015-03-15 NOTE — Progress Notes (Signed)
Report given to RN patricia at [facility].

## 2015-03-15 NOTE — Progress Notes (Signed)
Attempted to call report to RN X2 at [facility].

## 2015-03-15 NOTE — Discharge Summary (Signed)
Physician Discharge Summary  Sandra [name] MRN: [id number] DOB/AGE: 1932-02-22 82 y.o.  PCP: Garret Reddish, MD   Admit date: 03/08/2015 Discharge date: 03/15/2015  Discharge Diagnoses:     Principal Problem:   Aspiration pneumonia [facility]) Active Problems:   Hypothyroidism   Chronic kidney disease, stage III (moderate)   Chronic systolic heart failure (HCC)   Persistent atrial fibrillation (HCC)   Lobar pneumonia (HCC)   Acute on chronic renal failure (HCC)   Aspiration pneumonia of right middle lobe (HCC)   Aspiration into airway   Hemoptysis   Coagulopathy (HCC)   Acute on chronic systolic (congestive) heart failure ([place])    Follow-up recommendations Follow-up with PCP in 3-5 days , including all  additional recommended appointments as below Follow-up CBC, CMP in 3-5 days Strict aspiration precautions Daily INR, goal 2-3 Next  Speech therapy recommendations Diet recommendations: Regular;Thin liquid Liquids provided via: Cup;Straw Medication Administration: Whole meds with liquid Supervision: Patient able to self feed      Medication List    TAKE these medications        acetaminophen 325 MG tablet  Commonly known as:  TYLENOL  Take 650 mg by mouth every 6 (six) hours as needed for mild pain or headache. Reported on 03/07/2015     clindamycin 300 MG capsule  Commonly known as:  CLEOCIN  Take 1 capsule (300 mg total) by mouth 4 (four) times daily.     dextromethorphan 30 MG/5ML liquid  Commonly known as:  DELSYM  Take 5 mLs (30 mg total) by mouth 2 (two) times daily.     DOBUTamine 4-5 MG/ML-% infusion  Commonly known as:  DOBUTREX  Inject 330 mcg/min into the vein continuous.     ezetimibe 10 MG tablet  Commonly known as:  ZETIA  Take 10 mg by mouth every Monday, Wednesday, and Friday.     fluticasone 50 MCG/ACT nasal spray  Commonly known as:  FLONASE  Place 2 sprays into both nostrils daily.     levothyroxine 100 MCG tablet  Commonly known as:   SYNTHROID, LEVOTHROID  Take 100 mcg by mouth at bedtime.     pantoprazole 40 MG tablet  Commonly known as:  PROTONIX  Take 1 tablet (40 mg total) by mouth daily at 12 noon.     potassium chloride 10 MEQ tablet  Commonly known as:  K-DUR,KLOR-CON  Take 1 tablet (10 mEq total) by mouth 2 (two) times daily.     simethicone 80 MG chewable tablet  Commonly known as:  MYLICON  Chew 1 tablet (80 mg total) by mouth every 6 (six) hours as needed for flatulence.     torsemide 20 MG tablet  Commonly known as:  DEMADEX  Take 2 tablets (40 mg total) by mouth 2 (two) times daily.     triamcinolone cream 0.1 %  Commonly known as:  KENALOG  Apply 1 application topically 2 (two) times daily as needed (itching/rash).     VITAMIN E PO  Take 1 tablet by mouth daily.     warfarin 5 MG tablet  Commonly known as:  COUMADIN  Take 0.5-1 tablets (2.5-5 mg total) by mouth as directed. Take 2.5 mg (1/2 tablet) daily except 5 mg (1 tablet) on Monday         Discharge Condition:   Discharge Instructions   Discharge Instructions    Diet - low sodium heart healthy    Complete by:  As directed      Diet -  low sodium heart healthy    Complete by:  As directed      Increase activity slowly    Complete by:  As directed      Increase activity slowly    Complete by:  As directed            Allergies  Allergen Reactions  . Levofloxacin Other (See Comments)    Due to cardiac  AF  . Procaine Hcl Other (See Comments)    REACTION: smothers  . Spironolactone Diarrhea    Abdominal pain  . Amiodarone Nausea And Vomiting  . Tape Other (See Comments)    Tears skin off.  Please use "paper" tape only  . Amoxicillin Other (See Comments)    Rash   . Codeine Other (See Comments)    REACTION: nausea  . Niacin Other (See Comments)    REACTION: rash  . Ramipril Other (See Comments)    unknown  . Statins Other (See Comments)    REACTION: muscle aches      Disposition: 01-Home or Self  Care   Consults:  Pulmonary     Significant Diagnostic Studies:  Dg Chest 2 View  03/08/2015  CLINICAL DATA:  Acute onset of shortness of breath and cough. Back pain. Initial encounter. EXAM: CHEST  2 VIEW COMPARISON:  Chest radiograph performed 03/07/2015 FINDINGS: Rapidly increasing right midlung airspace opacity is compatible with pneumonia. No pleural effusion or pneumothorax is seen. The cardiomediastinal silhouette is enlarged. A pacemaker/AICD is noted at the left chest wall, with leads ending at the right atrium, right ventricle and coronary sinus. A right PICC is noted ending at the proximal right atrium. No acute osseous abnormalities are seen. IMPRESSION: 1. Worsening right midlung opacity, compatible with pneumonia. 2. Cardiomegaly. Electronically Signed   By: Garald Balding M.D.   On: 03/08/2015 02:43   Dg Chest 2 View  03/07/2015  CLINICAL DATA:  Right-sided chest pain and hemoptysis for 2 days. EXAM: CHEST  2 VIEW COMPARISON:  Chest x-ray 02/06/2015 FINDINGS: The heart is enlarged but stable. There is tortuosity and calcification of the thoracic aorta. The pacer wires are stable. Right perihilar density is most likely pneumonia. It was not present on the prior chest x-ray. No pleural effusion. The left lung is clear. The bony thorax is intact. IMPRESSION: Right perihilar infiltrate likely in the superior segment of the right lower lobe based on the lateral film. Followup PA and lateral chest X-ray is recommended in 3-4 weeks following trial of antibiotic therapy to ensure resolution and exclude underlying malignancy. Electronically Signed   By: Marijo Sanes M.D.   On: 03/07/2015 16:04   Ct Chest Wo Contrast  03/09/2015  CLINICAL DATA:  Hemoptysis. Evaluate for aspiration pneumonia. Cough, fever. EXAM: CT CHEST WITHOUT CONTRAST TECHNIQUE: Multidetector CT imaging of the chest was performed following the standard protocol without IV contrast. COMPARISON:  03/08/2015 FINDINGS: There is  a small right pleural effusion. Consolidation noted throughout much of the right lower lobe, most confluent in the superior segment. This is most compatible with pneumonia. Aspiration pneumonia is possible. Left lung is clear except for minimal atelectasis or scarring at the left base. There is cardiomegaly. Small pericardial effusion. Left ICD device in place. Aortic calcifications without aneurysm. Mild mediastinal adenopathy. Precarinal lymph node has a short axis diameter of 20 mm. Right paratracheal lymph node has a short axis diameter of 10 mm. No visible hilar or axillary adenopathy. Imaging into the upper abdomen shows no acute findings. No  acute bony abnormality or focal bone lesion. IMPRESSION: Consolidation throughout much of the right lower lobe, most confluent in the superior segment compatible with pneumonia. Aspiration is not excluded. Mild mediastinal adenopathy which may be reactive. This could be followed after treatment and resolution of acute symptoms, in 3-6 months to assure stability or resolution. Cardiomegaly. Small right pleural effusion and pericardial effusion. Electronically Signed   By: Rolm Baptise M.D.   On: 03/09/2015 09:41   Dg Chest Port 1 View  03/12/2015  CLINICAL DATA:  Right lower lobe aspiration pneumonia, cough. EXAM: PORTABLE CHEST 1 VIEW COMPARISON:  Chest x-ray of 08 March 2015 and chest CT scan of 09 March 2015 FINDINGS: The left lung is hyperinflated but clear. On the right consolidation in the right perihilar region persists with mildly increased interstitial markings more peripherally. There is a small right pleural effusion. The cardiac silhouette remains enlarged. The pulmonary vascularity remains engorged and indistinct. The pacemaker defibrillator is in stable position. The right-sided PICC line tip projects over the distal third of the SVC. IMPRESSION: There has been some interval worsening in pulmonary interstitial edema predominantly on the right.  Confluent alveolar opacity on the right is consistent with pneumonia. There is a small right pleural effusion. Stable cardiomegaly. Electronically Signed   By: David  Martinique M.D.   On: 03/12/2015 07:40        Filed Weights   03/13/15 0423 03/14/15 0430 03/15/15 0420  Weight: 63.141 kg (139 lb 3.2 oz) 63.368 kg (139 lb 11.2 oz) 63.912 kg (140 lb 14.4 oz)     Microbiology: Recent Results (from the past 240 hour(s))  Blood culture (routine x 2)     Status: None   Collection Time: 03/08/15  1:20 AM  Result Value Ref Range Status   Specimen Description BLOOD LEFT ARM  Final   Special Requests BOTTLES DRAWN AEROBIC AND ANAEROBIC 5ML  Final   Culture   Final    NO GROWTH 5 DAYS Performed at [facility]    Report Status 03/13/2015 FINAL  Final  Blood culture (routine x 2)     Status: None   Collection Time: 03/08/15  1:20 AM  Result Value Ref Range Status   Specimen Description BLOOD LEFT HAND  Final   Special Requests BOTTLES DRAWN AEROBIC AND ANAEROBIC 5ML  Final   Culture   Final    NO GROWTH 5 DAYS Performed at [facility]    Report Status 03/13/2015 FINAL  Final       Blood Culture    Component Value Date/Time   SDES BLOOD LEFT ARM 03/08/2015 0120   SDES BLOOD LEFT HAND 03/08/2015 0120   SPECREQUEST BOTTLES DRAWN AEROBIC AND ANAEROBIC 5ML 03/08/2015 0120   SPECREQUEST BOTTLES DRAWN AEROBIC AND ANAEROBIC 5ML 03/08/2015 0120   CULT  03/08/2015 0120    NO GROWTH 5 DAYS Performed at [place]  03/08/2015 0120    NO GROWTH 5 DAYS Performed at [place] 03/13/2015 FINAL 03/08/2015 0120   REPTSTATUS 03/13/2015 FINAL 03/08/2015 0120      Labs: Results for orders placed or performed during the hospital encounter of 03/08/15 (from the past 48 hour(s))  Protime-INR     Status: Abnormal   Collection Time: 03/14/15  4:55 AM  Result Value Ref Range   Prothrombin Time 27.2 (H) 11.6 - 15.2 seconds   INR 2.57 (H)  0.00 - 1.49  CBC  Status: Abnormal   Collection Time: 03/14/15  4:55 AM  Result Value Ref Range   WBC 6.3 4.0 - 10.5 K/uL   RBC 4.09 3.87 - 5.11 MIL/uL   Hemoglobin 9.7 (L) 12.0 - 15.0 g/dL   HCT 01.4 (L) 99.6 - 92.4 %   MCV 80.9 78.0 - 100.0 fL   MCH 23.7 (L) 26.0 - 34.0 pg   MCHC 29.3 (L) 30.0 - 36.0 g/dL   RDW 93.2 (H) 41.9 - 91.4 %   Platelets 248 150 - 400 K/uL  Basic metabolic panel     Status: Abnormal   Collection Time: 03/14/15  4:55 AM  Result Value Ref Range   Sodium 133 (L) 135 - 145 mmol/L   Potassium 3.9 3.5 - 5.1 mmol/L   Chloride 94 (L) 101 - 111 mmol/L   CO2 29 22 - 32 mmol/L   Glucose, Bld 87 65 - 99 mg/dL   BUN 41 (H) 6 - 20 mg/dL   Creatinine, Ser 4.45 (H) 0.44 - 1.00 mg/dL   Calcium 9.2 8.9 - 84.8 mg/dL   GFR calc non Af Amer 22 (L) >60 mL/min   GFR calc Af Amer 25 (L) >60 mL/min    Comment: (NOTE) The eGFR has been calculated using the CKD EPI equation. This calculation has not been validated in all clinical situations. eGFR's persistently <60 mL/min signify possible Chronic Kidney Disease.    Anion gap 10 5 - 15  Protime-INR     Status: Abnormal   Collection Time: 03/15/15  4:15 AM  Result Value Ref Range   Prothrombin Time 23.7 (H) 11.6 - 15.2 seconds   INR 2.14 (H) 0.00 - 1.49     Lipid Panel     Component Value Date/Time   CHOL 231* 08/04/2011 1045   TRIG 220.0* 08/04/2011 1045   HDL 42.30 08/04/2011 1045   CHOLHDL 5 08/04/2011 1045   VLDL 44.0* 08/04/2011 1045   LDLCALC 124* 06/24/2009 0924   LDLDIRECT 124.9 10/16/2013 1526     No results found for: HGBA1C   Lab Results  Component Value Date   LDLCALC 124* 06/24/2009   CREATININE 2.04* 03/14/2015     HPI :*83yo female with hx chronic sCHF/NICM with EF 25% on chronic outpt dobutamine, AFib on coumadin s/p recent AV node ablation (02/06/15) admitted 2/27 with aspiration PNA after "choking" episode at home. She was started on IV abx and followed closely by heart failure team.  On 2/27 she developed increased SOB, cough and mild hemoptysis and PCCM consulted.   HOSPITAL COURSE: Aspiration pneumonia  - Following choking incident a few days prior to presentation Patient treated with Zosyn and subsequently Rocephin, now switched to clindamycin times another 4 days. Patient has been on antibiotics since 2/24 - Follow-up blood cultures, no growth so far - SLP evaluation without signs of aspiration, recommendations as above - CT chest 2/25 with RLL consolidation compatible with pneumonia.  - hemoptysis stable, improving, pulm consulted and recommended to resume Coumadin  Chronic systolic CHF/ low output state on chronic dobutamine - EF is 25%, Clinically compensated, seen by cardiology today - Continue dobutamine , patient was followed by heart failure team Back to baseline from HF perspective. Can go home from cardiology standpoint  Follow-up with CHF clinic in 2 weeks  Elevated Troponin - no symptoms of ACS - suspect due to demand/pneumonia/CKD - no CAD based on cards notes  Persistent atrial fibrillation INR supratherapeutic and went up to 3.99 in the setting of aspiration  pneumonia and hemoptysis, Coumadin was held for several days but then resumed prior to discharge INR 2.57 on the day of discharge Please check daily INR  Hypothyroidism - Continue Synthroid  Chronic kidney disease, stage IV - Creatinine stable at baseline 2.0 - Diuretics per heart failure team    Discharge Exam:   Blood pressure 113/67, pulse 72, temperature 98.1 F (36.7 C), temperature source Oral, resp. rate 19, height '5\' 4"'$  (1.626 m), weight 63.912 kg (140 lb 14.4 oz), SpO2 97 %.   General exam: grimacing with deep breathing  Respiratory system: No wheezing Cardiovascular system: regular rate and rhythm  Gastrointestinal system: Abdomen is nondistended, soft and nontender. Normal bowel sounds heard. Central nervous system: AxOx3. No focal deficits Extremities: No  clubbing/cyanosis Skin: no rashes   Follow-up Information    Follow up with [facility] .   Specialty:  [place] information:   [place] [phone]      Follow up with [facility] On 03/19/2015.   Specialty:  Cardiology   Why:  at 1000 am for post hospital follow up. Please bring all of your medications to your visit. The code for patient parking is 0010.   Contact information:   [address] [phone] [place] [phone]      Follow up with Garret Reddish, MD. Schedule an appointment as soon as possible for a visit in 3 days.   Specialty:  Family Medicine   Contact information:   [place] [address] [phone]       Signed: Reyne Dumas 03/15/2015, 1:43 PM        Time spent >45 mins

## 2015-03-18 NOTE — Progress Notes (Signed)
Patient ID: Sandra [name], female   DOB: Mar 02, 1932, 82 y.o.   MRN: [id number]    Advanced Heart Failure Clinic Note   Primary Care: Dr Yong Channel Primary EP: Dr. Caryl Comes Primary HF: Dr. Haroldine Laws   HPI:  Sandra [name] is a 82 y.o. female with h/o PAF, hypothyroidism, St Jude CRT-D CKD stage 4 and chronic systolic HF due to NICM with EF 25%. Tikosyn previously held with worsening renal function.   Admitted 10/29/14 after [place] with cardiogenic shock. Functional decline correlated with recurrence of A fib.  Placed on milrinone and diuresed with IV lasix. Due to persistent low output she was switched from milrinone to dobutamine with improved hemodynamics. Dobutamine was unable to be weaned.  Continued on dobutamine 5 mcg via [facility].  No ace or bb with low output/CKD.  EP consulted for AVN ablation; Deferred in favor of Tikosyn and repeat DC-CV. Tikosyn was reloaded and cardioversion initially successful, but reverted to A fib with controlled rate prior to d/c. Tikosyn stopped. [facility]. Overall she diuresed 11 pounds with IV lasix and was transitioned to 40 mg daily torsemide. Discharge weight 140  Underwent AVN ablation on 02/06/15.  Admitted 03/08/15 with a bad aspirational pneumonia. HF initially stable but became volume overloaded with PNA treatment.  Was much improved with IV lasix with weight down ~10 lbs from highest weight that admission and downtrend in creatinine. No changes to her dobutamine were made. Discharge weight 140 lbs.  She presents today for post hospital follow up. Back in [facility] for rehab. Feeling OK overall.  Didn't get much sleep last night, up coughing. Continues to have some blood in sputum, but dark, and getting better slowly. Isn't eating very much at [facility], not on low salt diet (had bacon on breakfast tray this am). Did first rehab yesterday and breathing was OK. No further CP, is still having some back pain on R side.    Corvue: Has been  volume overloaded the past few days but trending back towards dry. Chronic AF > 99% burden.   [place] 10/29/2014  RA = 19 RV = 70/9/19 PA = 69/31 (45) PCW = 27 Fick cardiac output/index = 2.3/1.4 Thermo cardiac output/index = 2.3/1.4 PVR = 7.8 WU Ao sat = 94% PA sat = 35%, 38%  Labs 11/25/14 K 4.8, Cr 1.7  Past Medical History  Diagnosis Date  . Atrial fibrillation ([place]) permanent     on Tikosyn and Coumadin  . Hyperlipidemia   . Hypothyroidism   . Bradycardia   . Cardiomyopathy, hypertrophic nonobstructive (HCC)     ejection fraction 25%  . Anal fissure   . Adenomatous colon polyp   . Diverticulosis   . ICD-CRT     generator change 2013  . Systolic heart failure   . Hyperlipidemia   . Hypertension   . RBBB plus LA hemiblock   . Family history of adverse reaction to anesthesia     "middle daughter couldn't take Versed"  . CHF (congestive heart failure) ([place])   . Varicose veins   . Chronic bronchitis ([place])     "get it most q spring and fall" (02/06/2015)  . Migraines     "stopped after I went thru the change"  . History of gout   . Situational anxiety   . Chronic renal insufficiency, stage III (moderate)     gd 3-4  . Basal cell carcinoma     "right foot; right leg" (02/06/2015)  . Aspiration pneumonia (  [place]) 03/08/2015    Archie Endo 03/08/2015    Current Outpatient Prescriptions  Medication Sig Dispense Refill  . acetaminophen (TYLENOL) 325 MG tablet Take 650 mg by mouth every 6 (six) hours as needed for mild pain or headache. Reported on 03/07/2015    . clindamycin (CLEOCIN) 300 MG capsule Take 1 capsule (300 mg total) by mouth 4 (four) times daily. 12 capsule 0  . dextromethorphan (DELSYM) 30 MG/5ML liquid Take 5 mLs (30 mg total) by mouth 2 (two) times daily. 89 mL 0  . DOBUTamine (DOBUTREX) 4-5 MG/ML-% infusion Inject 330 mcg/min into the vein continuous. 250 mL 6  . ezetimibe (ZETIA) 10 MG tablet Take 10 mg by mouth every Monday, Wednesday, and Friday.     .  fluticasone (FLONASE) 50 MCG/ACT nasal spray Place 2 sprays into both nostrils daily. 16 g 6  . levothyroxine (SYNTHROID, LEVOTHROID) 100 MCG tablet Take 100 mcg by mouth at bedtime.    . pantoprazole (PROTONIX) 40 MG tablet Take 1 tablet (40 mg total) by mouth daily at 12 noon. 30 tablet 0  . potassium chloride SA (K-DUR,KLOR-CON) 10 MEQ tablet Take 1 tablet (10 mEq total) by mouth 2 (two) times daily.    . simethicone (MYLICON) 80 MG chewable tablet Chew 1 tablet (80 mg total) by mouth every 6 (six) hours as needed for flatulence. 30 tablet 0  . torsemide (DEMADEX) 20 MG tablet Take 2 tablets (40 mg total) by mouth 2 (two) times daily. 120 tablet 6  . triamcinolone cream (KENALOG) 0.1 % Apply 1 application topically 2 (two) times daily as needed (itching/rash).    Marland Kitchen VITAMIN E PO Take 1 tablet by mouth daily.    Marland Kitchen warfarin (COUMADIN) 5 MG tablet Take 0.5-1 tablets (2.5-5 mg total) by mouth as directed. Take 2.5 mg (1/2 tablet) daily except 5 mg (1 tablet) on Monday 30 tablet 0   No current facility-administered medications for this encounter.    Allergies  Allergen Reactions  . Levofloxacin Other (See Comments)    Due to cardiac  AF  . Procaine Hcl Other (See Comments)    REACTION: smothers  . Spironolactone Diarrhea    Abdominal pain  . Amiodarone Nausea And Vomiting  . Tape Other (See Comments)    Tears skin off.  Please use "paper" tape only  . Amoxicillin Other (See Comments)    Rash   . Codeine Other (See Comments)    REACTION: nausea  . Niacin Other (See Comments)    REACTION: rash  . Ramipril Other (See Comments)    unknown  . Statins Other (See Comments)    REACTION: muscle aches      Social History   Social History  . Marital Status: Married    Spouse Name: N/A  . Number of Children: 3  . Years of Education: N/A   Occupational History  . Retired     Probation officer   Social History Main Topics  . Smoking status: Former Smoker -- 0.20 packs/day for 19 years     Types: Cigarettes    Quit date: 01/13/1987  . Smokeless tobacco: Never Used  . Alcohol Use: No  . Drug Use: No  . Sexual Activity: No   Other Topics Concern  . Not on file   Social History Narrative   Married 1953 (husband Clare Gandy in our [employer]). 3 girls (1 is a patient here-[name]). 2 grandkids.       Retired in [employer]  Hobbies: very active, going to church and church friends, movies      Family History  Problem Relation Age of Onset  . Heart disease Mother   . Colon cancer Father   . COPD Father   . Heart disease Father   . Cardiomyopathy Daughter   . Breast cancer Paternal Aunt   . Colon polyps Father   . Diabetes Neg Hx   . Kidney disease Neg Hx     Filed Vitals:   03/19/15 1001  BP: 126/78  Pulse: 78  Weight: 142 lb (64.411 kg)  SpO2: 95%   Wt Readings from Last 3 Encounters:  03/19/15 142 lb (64.411 kg)  03/15/15 140 lb 14.4 oz (63.912 kg)  03/07/15 136 lb (61.689 kg)      PHYSICAL EXAM: General:  Elderly appearing. NAD HEENT: normal Neck: supple. JVP 7-8. Carotids 2+ bilat; no bruits. No thyromegaly or nodule noted. Cor: PMI laterally displaced. regular Lungs: Basilar crackles, diminished R side.   Abdomen: soft, NT, ND, no HSM. No bruits or masses. +BS  Extremities: no cyanosis, clubbing, rash, no edema. Arthritic changes.  Neuro: alert & oriented x 3, cranial nerves grossly intact. moves all 4 extremities w/o difficulty. Affect pleasant. Skin: no rash.  ASSESSMENT & PLAN:  1. Chronic systolic HF due to NICM, Echo 10/2014 EF 20-25% s/p St Jude CRT-D  - Volume status mildly elevated.  - Contnue torsemide 40 BID.  Will give 2.5 mg metolazone today before evening dose of torsemide with extra 20 meq of potassium.  - Continue to take 2.5 mg metolazone only as needed for weight gain of 3 lbs overnight or 5 lbs within a week with extra 20 meq of K when taking metolazone.   - Needs to see EP for BiV optimization. Was scheduled to see  3/1 but was in hospital, instructed to call.  - Will change Potassium to solution.  Still recommend 20 meq BID. So will change to that. Will get BMET on Monday at facility.  - Needs daily weights, low salt diet, and fluid restrict to 2L.  2. Aspirational Pneumonia - Continues to cough with some slight hemoptysis, older blood.  - Remains on clindamycin - Needs to Schedule follow up with Dr Yong Channel ASAP, per discharge summaries.  2. PAF -S/P DC-CV 10/28 but later back in A fib s/p AV node ablation. Failed amio and Tikosyn.  - Chronic afib by corvue ( AT/AF burden > 99%) 3. CKD, stage 3-4 -stable.  - Will have BMET from yesterday. Will recheck on Monday at facility with change in potassium.  4. Hypothyroidism - Per PCP 5. Gout - Waiting to hear back from Dr Beakman/Rheumatology office.  Follow up in 2-3 weeks with MD.   Shirley Friar, PA-C 10:06 AM

## 2015-03-19 ENCOUNTER — Ambulatory Visit (HOSPITAL_COMMUNITY)
Admission: RE | Admit: 2015-03-19 | Discharge: 2015-03-19 | Disposition: A | Discharge status: Home / Self Care | Payer: No Typology Code available for payment source | Source: Ambulatory Visit | Attending: Cardiology | Admitting: Cardiology

## 2015-03-19 VITALS — BP 126/78 | HR 78 | Wt 142.0 lb

## 2015-03-19 DIAGNOSIS — Z880 Allergy status to penicillin: Secondary | ICD-10-CM | POA: Diagnosis not present

## 2015-03-19 DIAGNOSIS — I482 Chronic atrial fibrillation, unspecified: Secondary | ICD-10-CM

## 2015-03-19 DIAGNOSIS — I428 Other cardiomyopathies: Secondary | ICD-10-CM | POA: Diagnosis not present

## 2015-03-19 DIAGNOSIS — Z885 Allergy status to narcotic agent status: Secondary | ICD-10-CM | POA: Insufficient documentation

## 2015-03-19 DIAGNOSIS — Z7901 Long term (current) use of anticoagulants: Secondary | ICD-10-CM | POA: Diagnosis not present

## 2015-03-19 DIAGNOSIS — I10 Essential (primary) hypertension: Secondary | ICD-10-CM

## 2015-03-19 DIAGNOSIS — Z825 Family history of asthma and other chronic lower respiratory diseases: Secondary | ICD-10-CM | POA: Insufficient documentation

## 2015-03-19 DIAGNOSIS — M109 Gout, unspecified: Secondary | ICD-10-CM | POA: Diagnosis not present

## 2015-03-19 DIAGNOSIS — E039 Hypothyroidism, unspecified: Secondary | ICD-10-CM | POA: Insufficient documentation

## 2015-03-19 DIAGNOSIS — E785 Hyperlipidemia, unspecified: Secondary | ICD-10-CM | POA: Diagnosis not present

## 2015-03-19 DIAGNOSIS — I130 Hypertensive heart and chronic kidney disease with heart failure and stage 1 through stage 4 chronic kidney disease, or unspecified chronic kidney disease: Secondary | ICD-10-CM | POA: Insufficient documentation

## 2015-03-19 DIAGNOSIS — Z85828 Personal history of other malignant neoplasm of skin: Secondary | ICD-10-CM | POA: Diagnosis not present

## 2015-03-19 DIAGNOSIS — N183 Chronic kidney disease, stage 3 unspecified: Secondary | ICD-10-CM

## 2015-03-19 DIAGNOSIS — Z881 Allergy status to other antibiotic agents status: Secondary | ICD-10-CM | POA: Insufficient documentation

## 2015-03-19 DIAGNOSIS — Z95810 Presence of automatic (implantable) cardiac defibrillator: Secondary | ICD-10-CM | POA: Insufficient documentation

## 2015-03-19 DIAGNOSIS — Z8249 Family history of ischemic heart disease and other diseases of the circulatory system: Secondary | ICD-10-CM | POA: Insufficient documentation

## 2015-03-19 DIAGNOSIS — I5022 Chronic systolic (congestive) heart failure: Secondary | ICD-10-CM | POA: Insufficient documentation

## 2015-03-19 DIAGNOSIS — J690 Pneumonitis due to inhalation of food and vomit: Secondary | ICD-10-CM | POA: Insufficient documentation

## 2015-03-19 DIAGNOSIS — Z87891 Personal history of nicotine dependence: Secondary | ICD-10-CM | POA: Insufficient documentation

## 2015-03-19 DIAGNOSIS — I5020 Unspecified systolic (congestive) heart failure: Secondary | ICD-10-CM | POA: Diagnosis not present

## 2015-03-19 DIAGNOSIS — Z888 Allergy status to other drugs, medicaments and biological substances status: Secondary | ICD-10-CM | POA: Insufficient documentation

## 2015-03-19 DIAGNOSIS — N184 Chronic kidney disease, stage 4 (severe): Secondary | ICD-10-CM | POA: Insufficient documentation

## 2015-03-19 DIAGNOSIS — Z79899 Other long term (current) drug therapy: Secondary | ICD-10-CM | POA: Diagnosis not present

## 2015-03-19 MED ORDER — POTASSIUM CHLORIDE 20 MEQ/15ML (10%) PO SOLN: 20.0000 meq | Freq: Two times a day (BID) | ORAL | Status: DC

## 2015-03-19 NOTE — Patient Instructions (Signed)
CHANGE Potassium to 15 ML/20 MEQ oral twice a day  Take 2.5 mg of metolazone today with extra dose of potassium  Labs (BMET) weekly with facility   Your physician recommends that you schedule a follow-up appointment in: 2 weeks with Dr.Bensimhon  Do the following things EVERYDAY: 1) Weigh yourself in the morning before breakfast. Write it down and keep it in a log. 2) Take your medicines as prescribed 3) Eat low salt foods-Limit salt (sodium) to 2000 mg per day.  4) Stay as active as you can everyday 5) Limit all fluids for the day to less than 2 liters 6)

## 2015-03-19 NOTE — Progress Notes (Signed)
Advanced Heart Failure Medication Review by a Pharmacist  Does the patient  feel that his/her medications are working for him/her?  N/A  Has the patient been experiencing any side effects to the medications prescribed?  None reported per facility  Does the patient measure his/her own blood pressure or blood glucose at home?  In facility   Does the patient have any problems obtaining medications due to transportation or finances?   no  Understanding of regimen: fair Understanding of indications: fair Potential of compliance: excellent Patient understands to avoid NSAIDs. Patient understands to avoid decongestants.  Issues to address at subsequent visits: None   Pharmacist comments: Reviewed medication list from [facility] facility.  Per facility chart pt refused potassium on 3/6.  No reported SE or medication related problems from facility    Time with patient: 5 min Preparation and documentation time: 5 min Total time: 10 min

## 2015-03-26 ENCOUNTER — Other Ambulatory Visit (HOSPITAL_COMMUNITY): Payer: Self-pay | Admitting: Internal Medicine

## 2015-03-29 ENCOUNTER — Ambulatory Visit: Payer: Self-pay | Admitting: General Practice

## 2015-03-29 ENCOUNTER — Other Ambulatory Visit (HOSPITAL_COMMUNITY): Payer: Self-pay | Admitting: Internal Medicine

## 2015-04-02 ENCOUNTER — Telehealth: Payer: Self-pay | Admitting: Internal Medicine

## 2015-04-02 ENCOUNTER — Ambulatory Visit (INDEPENDENT_AMBULATORY_CARE_PROVIDER_SITE_OTHER): Payer: Medicare Other | Admitting: *Deleted

## 2015-04-02 ENCOUNTER — Encounter: Payer: Self-pay | Admitting: Internal Medicine

## 2015-04-02 DIAGNOSIS — I422 Other hypertrophic cardiomyopathy: Secondary | ICD-10-CM

## 2015-04-02 NOTE — Telephone Encounter (Signed)
NewMessage  Pt sending remote- requested to speak w/ Device to see if it was sent successfully. Please call back and discuss.

## 2015-04-02 NOTE — Telephone Encounter (Signed)
Informed pt daughter that we did get her remote transmission.

## 2015-04-03 NOTE — Progress Notes (Signed)
Remote ICD transmission.   

## 2015-04-04 ENCOUNTER — Ambulatory Visit (INDEPENDENT_AMBULATORY_CARE_PROVIDER_SITE_OTHER): Payer: Medicare Other | Admitting: General Practice

## 2015-04-04 ENCOUNTER — Telehealth: Payer: Self-pay | Admitting: General Practice

## 2015-04-04 ENCOUNTER — Encounter: Payer: Self-pay | Admitting: Family Medicine

## 2015-04-04 ENCOUNTER — Ambulatory Visit (INDEPENDENT_AMBULATORY_CARE_PROVIDER_SITE_OTHER): Payer: Medicare Other | Admitting: Family Medicine

## 2015-04-04 VITALS — BP 126/88 | HR 72 | Temp 97.6°F | Resp 18 | Ht 64.0 in | Wt 136.0 lb

## 2015-04-04 DIAGNOSIS — J690 Pneumonitis due to inhalation of food and vomit: Secondary | ICD-10-CM

## 2015-04-04 DIAGNOSIS — M10041 Idiopathic gout, right hand: Secondary | ICD-10-CM | POA: Diagnosis not present

## 2015-04-04 DIAGNOSIS — N184 Chronic kidney disease, stage 4 (severe): Secondary | ICD-10-CM

## 2015-04-04 DIAGNOSIS — N189 Chronic kidney disease, unspecified: Secondary | ICD-10-CM

## 2015-04-04 DIAGNOSIS — N183 Chronic kidney disease, stage 3 unspecified: Secondary | ICD-10-CM

## 2015-04-04 DIAGNOSIS — Z5181 Encounter for therapeutic drug level monitoring: Secondary | ICD-10-CM | POA: Diagnosis not present

## 2015-04-04 DIAGNOSIS — N179 Acute kidney failure, unspecified: Secondary | ICD-10-CM | POA: Diagnosis not present

## 2015-04-04 LAB — COMPREHENSIVE METABOLIC PANEL
ALT: 19 U/L (ref 0–35)
AST: 31 U/L (ref 0–37)
Albumin: 4.1 g/dL (ref 3.5–5.2)
Alkaline Phosphatase: 99 U/L (ref 39–117)
BUN: 40 mg/dL — AB (ref 6–23)
CALCIUM: 9.7 mg/dL (ref 8.4–10.5)
CHLORIDE: 99 meq/L (ref 96–112)
CO2: 32 meq/L (ref 19–32)
CREATININE: 1.93 mg/dL — AB (ref 0.40–1.20)
GFR: 26.47 mL/min — ABNORMAL LOW (ref >60.00)
GLUCOSE: 78 mg/dL (ref 70–99)
POTASSIUM: 3.8 meq/L (ref 3.5–5.1)
Sodium: 141 mEq/L (ref 135–145)
TOTAL PROTEIN: 7.3 g/dL (ref 6.0–8.3)
Total Bilirubin: 1.2 mg/dL (ref 0.2–1.2)

## 2015-04-04 LAB — CBC WITH DIFFERENTIAL/PLATELET
BASOS PCT: 0.6 % (ref 0.0–3.0)
Basophils Absolute: 0 10*3/uL (ref 0.0–0.1)
EOS ABS: 0.1 10*3/uL (ref 0.0–0.7)
EOS PCT: 2.3 % (ref 0.0–5.0)
HCT: 35.1 % — ABNORMAL LOW (ref 36.0–46.0)
Hemoglobin: 11.1 g/dL — ABNORMAL LOW (ref 12.0–15.0)
LYMPHS ABS: 0.7 10*3/uL (ref 0.7–4.0)
Lymphocytes Relative: 16 % (ref 12.0–46.0)
MCHC: 31.7 g/dL (ref 30.0–36.0)
MCV: 77.1 fl — ABNORMAL LOW (ref 78.0–100.0)
MONO ABS: 0.4 10*3/uL (ref 0.1–1.0)
Monocytes Relative: 10.2 % (ref 3.0–12.0)
NEUTROS ABS: 3.1 10*3/uL (ref 1.4–7.7)
Neutrophils Relative %: 70.9 % (ref 43.0–77.0)
PLATELETS: 251 10*3/uL (ref 150.0–400.0)
RBC: 4.55 Mil/uL (ref 3.87–5.11)
RDW: 22.4 % — AB (ref 11.5–15.5)
WBC: 4.4 10*3/uL (ref 4.0–10.5)

## 2015-04-04 LAB — POCT INR: INR: 4.2

## 2015-04-04 LAB — URIC ACID: Uric Acid, Serum: 13.1 mg/dL — ABNORMAL HIGH (ref 2.4–7.0)

## 2015-04-04 NOTE — Progress Notes (Signed)
Pre visit review using our clinic review tool, if applicable. No additional management support is needed unless otherwise documented below in the visit note. INR is high today.  Patient was just released from re-habilitation.  Follow up with Dr. Yong Channel today.  Patient was in re-habilitation due to lung aspiration and she states that she has not had an appetite.  I held coumadin for 2 days.  [facility] will be checking patient's INR on Monday, 3/27 and calling results to Villa Herb, RN.   Patient's daughter is with her today and verbalized  Instructions.

## 2015-04-04 NOTE — Assessment & Plan Note (Signed)
S: Tophi on fingers. Prednisone for flares. Dr. Burney Gauze- has required surgery on right hand. She has been fearful of her ability to tolerate uric acid lowering agent and has declined allopurinol on multiple occasions. She has CKD Stage IV so allopurinol 200mg  likely max.  A/P: She would like to discuss options with rheumatology and requests Dr. Amil Amen. Check uric acid today and referral placed.

## 2015-04-04 NOTE — Patient Instructions (Signed)
Labs before you leave Sandra [name] and [name] will work together to get your INR  Push your may appointment back a month and we will repeat your CT scan at that time  No changes in medication  We will call you within a week about your referral to rheumatology Dr. Amil Amen. If you do not hear within 2 weeks, give [place] a call. I am also checking a uric acid today

## 2015-04-04 NOTE — Progress Notes (Signed)
Garret Reddish, MD  Subjective:  Sandra [name] is a 82 y.o. year old very pleasant female patient who presents for/with See problem oriented charting ROS- shortness of breath with activity, none at rest, no edema. No chest pain. No fever/chills/worsening fatigue- fatigue continues to improve.   Past Medical History-  Patient Active Problem List   Diagnosis Date Noted  . Persistent atrial fibrillation (HCC)     Priority: High  . Normal coronary arteries 2004 04/04/2014    Priority: High  . Chronic systolic heart failure ([place]) 12/18/2010    Priority: High  . Biventricular implantable cardioverter-defibrillator -St. Jude 05/15/2010    Priority: High  . Hypertrophic obstructive cardiomyopathy ([place]) 02/08/2007    Priority: High  . Aspiration pneumonia ([place]) 03/08/2015    Priority: Medium  . Essential hypertension, benign 10/16/2013    Priority: Medium  . Gout 10/16/2013    Priority: Medium  . Anxiety 03/31/2010    Priority: Medium  . Chronic kidney disease, stage III (moderate) 12/30/2009    Priority: Medium  . Hypothyroidism 07/08/2006    Priority: Medium  . Hyperlipidemia 07/08/2006    Priority: Medium  . RBBB plus LA hemiblock     Priority: Low  . Encounter for therapeutic drug monitoring 02/13/2013    Priority: Low  . Dyspnea 12/07/2011    Priority: Low  . RIATA Lead 03/23/2011    Priority: Low  . History of colonic polyps 09/10/2008    Priority: Low  . Fibrocystic breast disease 09/17/1991    Priority: Low    Medications- reviewed and updated Current Outpatient Prescriptions  Medication Sig Dispense Refill  . acetaminophen (TYLENOL) 325 MG tablet Take 650 mg by mouth every 6 (six) hours as needed for mild pain or headache. Reported on 03/07/2015    . DOBUTamine (DOBUTREX) 4-5 MG/ML-% infusion Inject 330 mcg/min into the vein continuous. 250 mL 6  . ezetimibe (ZETIA) 10 MG tablet Take 10 mg by mouth every Monday, Wednesday, and Friday.     . fluticasone (FLONASE)  50 MCG/ACT nasal spray Place 2 sprays into both nostrils daily. 16 g 6  . levothyroxine (SYNTHROID, LEVOTHROID) 100 MCG tablet Take 100 mcg by mouth at bedtime.    . potassium chloride 20 MEQ/15ML (10%) SOLN Take 15 mLs (20 mEq total) by mouth 2 (two) times daily. 450 mL 0  . simethicone (MYLICON) 80 MG chewable tablet Chew 1 tablet (80 mg total) by mouth every 6 (six) hours as needed for flatulence. 30 tablet 0  . torsemide (DEMADEX) 20 MG tablet Take 2 tablets (40 mg total) by mouth 2 (two) times daily. 120 tablet 6  . triamcinolone cream (KENALOG) 0.1 % Apply 1 application topically 2 (two) times daily as needed (itching/rash).    Marland Kitchen VITAMIN E PO Take 1 tablet by mouth daily.    Marland Kitchen warfarin (COUMADIN) 5 MG tablet Take 0.5-1 tablets (2.5-5 mg total) by mouth as directed. Take 2.5 mg (1/2 tablet) daily except 5 mg (1 tablet) on Monday 30 tablet 0   No current facility-administered medications for this visit.    Objective: BP 126/88 mmHg  Pulse 72  Temp(Src) 97.6 F (36.4 C) (Oral)  Resp 18  Ht 5\' 4"  (1.626 m)  Wt 136 lb (61.689 kg)  BMI 23.33 kg/m2  SpO2 97% Gen: NAD, resting comfortably, frail appearing CV: irregularly irregular gallops Lungs: CTAB no crackles, wheeze, rhonchi (prior crackles have cleared) Abdomen: soft/nontender/nondistended/normal bowel sounds.  Ext: no edema Skin: warm, dry, no rash Neuro: grossly  normal, moves all extremities  Assessment/Plan:  Acute on chronic renal failure (HCC) S: occurred while in hospital. Creatinine came back down to 2 before discharge A/P: repeat GFR today   Aspiration pneumonia ([place]) S: Patient was hospitalized from 03/08/15 to 3/317. She was treated with zosyn for aspiration pneumonia nad later transitioned to ceftriaxone then clindamycin. She improved throughout stay after initial worsening requiring PCCM consult. She ended up having a CT on 03/09/15 which had some lymph nodes noted and 3-6 month repeat suggested. She is feeling much  better- did spend about 2 weeks in [facility] and getting HH PT at this point. She in the morning has had very small flecks of blood in her sputum which clears after morning coughing and overall seems to be improving each day.  A/P: aspiration pneumonia has resolved. Likely had reactive lymph nodes- at 3 month follow up we will get repeat CT chest noncontrast. She will let me know if the small flecks of blood with coughing do not clear completely in coming weeks. Continue HHPT for now. Her pneumonia vaccination status is unclear- we will give prevnar 13 at follow up   Gout S: Tophi on fingers. Prednisone for flares. Dr. Burney Gauze- has required surgery on right hand. She has been fearful of her ability to tolerate uric acid lowering agent and has declined allopurinol on multiple occasions. She has CKD Stage IV so allopurinol 200mg  likely max.  A/P: She would like to discuss options with rheumatology and requests Dr. Amil Amen. Check uric acid today and referral placed.   also needs INR today as has bene supratherapeutic on previous checks- have to watch this with the hemoptysis  Early June follow up. Return precautions advised.   Orders Placed This Encounter  Procedures  . Uric Acid  . Comprehensive metabolic panel    [place]  . CBC with Differential/Platelet  . Ambulatory referral to Rheumatology    Referral Priority:  Routine    Referral Type:  Consultation    Referral Reason:  Specialty Services Required    Requested Specialty:  Rheumatology    Number of Visits Requested:  1    No orders of the defined types were placed in this encounter.

## 2015-04-04 NOTE — Telephone Encounter (Signed)
Orders faxed to [facility] to check patient's INR on Monday, 3/27.

## 2015-04-04 NOTE — Assessment & Plan Note (Signed)
S: occurred while in hospital. Creatinine came back down to 2 before discharge A/P: repeat GFR today

## 2015-04-04 NOTE — Assessment & Plan Note (Addendum)
S: Patient was hospitalized from 03/08/15 to 3/317. She was treated with zosyn for aspiration pneumonia nad later transitioned to ceftriaxone then clindamycin. She improved throughout stay after initial worsening requiring PCCM consult. She ended up having a CT on 03/09/15 which had some lymph nodes noted and 3-6 month repeat suggested. She is feeling much better- did spend about 2 weeks in [facility] and getting HH PT at this point. She in the morning has had very small flecks of blood in her sputum which clears after morning coughing and overall seems to be improving each day.  A/P: aspiration pneumonia has resolved. Likely had reactive lymph nodes- at 3 month follow up we will get repeat CT chest noncontrast. She will let me know if the small flecks of blood with coughing do not clear completely in coming weeks. Continue HHPT for now. Her pneumonia vaccination status is unclear- we will give prevnar 13 at follow up

## 2015-04-08 ENCOUNTER — Ambulatory Visit (INDEPENDENT_AMBULATORY_CARE_PROVIDER_SITE_OTHER): Payer: Medicare Other | Admitting: General Practice

## 2015-04-08 ENCOUNTER — Other Ambulatory Visit (HOSPITAL_COMMUNITY)
Admission: RE | Admit: 2015-04-08 | Discharge: 2015-04-08 | Disposition: A | Discharge status: Home / Self Care | Payer: Medicare Other | Source: Other Acute Inpatient Hospital | Attending: Internal Medicine | Admitting: Internal Medicine

## 2015-04-08 DIAGNOSIS — Z5181 Encounter for therapeutic drug level monitoring: Secondary | ICD-10-CM

## 2015-04-08 DIAGNOSIS — I5023 Acute on chronic systolic (congestive) heart failure: Secondary | ICD-10-CM | POA: Insufficient documentation

## 2015-04-08 LAB — CBC WITH DIFFERENTIAL/PLATELET
Basophils Absolute: 0 10*3/uL (ref 0.0–0.1)
Basophils Relative: 0 %
Eosinophils Absolute: 0.1 10*3/uL (ref 0.0–0.7)
Eosinophils Relative: 2 %
HCT: 36.6 % (ref 36.0–46.0)
Hemoglobin: 10.8 g/dL — ABNORMAL LOW (ref 12.0–15.0)
LYMPHS ABS: 0.8 10*3/uL (ref 0.7–4.0)
LYMPHS PCT: 18 %
MCH: 24.2 pg — AB (ref 26.0–34.0)
MCHC: 29.5 g/dL — ABNORMAL LOW (ref 30.0–36.0)
MCV: 81.9 fL (ref 78.0–100.0)
MONO ABS: 0.5 10*3/uL (ref 0.1–1.0)
Monocytes Relative: 11 %
NEUTROS ABS: 3.2 10*3/uL (ref 1.7–7.7)
NEUTROS PCT: 69 %
PLATELETS: 250 10*3/uL (ref 150–400)
RBC: 4.47 MIL/uL (ref 3.87–5.11)
RDW: 20.7 % — AB (ref 11.5–15.5)
WBC: 4.6 10*3/uL (ref 4.0–10.5)

## 2015-04-08 LAB — POCT INR: INR: 2.1

## 2015-04-08 LAB — BASIC METABOLIC PANEL
ANION GAP: 15 (ref 5–15)
BUN: 36 mg/dL — ABNORMAL HIGH (ref 6–20)
CHLORIDE: 96 mmol/L — AB (ref 101–111)
CO2: 28 mmol/L (ref 22–32)
Calcium: 9.2 mg/dL (ref 8.9–10.3)
Creatinine, Ser: 1.9 mg/dL — ABNORMAL HIGH (ref 0.44–1.00)
GFR calc Af Amer: 27 mL/min — ABNORMAL LOW (ref >60)
GFR calc non Af Amer: 24 mL/min — ABNORMAL LOW (ref >60)
GLUCOSE: 72 mg/dL (ref 65–99)
POTASSIUM: 3.5 mmol/L (ref 3.5–5.1)
Sodium: 139 mmol/L (ref 135–145)

## 2015-04-08 LAB — MAGNESIUM: Magnesium: 2.2 mg/dL (ref 1.7–2.4)

## 2015-04-08 NOTE — Progress Notes (Signed)
I agree with this plan.

## 2015-04-08 NOTE — Progress Notes (Signed)
Pre visit review using our clinic review tool, if applicable. No additional management support is needed unless otherwise documented below in the visit note. 

## 2015-04-09 ENCOUNTER — Ambulatory Visit (HOSPITAL_COMMUNITY)
Admission: RE | Admit: 2015-04-09 | Discharge: 2015-04-09 | Disposition: A | Discharge status: Home / Self Care | Payer: Medicare Other | Source: Ambulatory Visit | Attending: Internal Medicine | Admitting: Internal Medicine

## 2015-04-09 VITALS — BP 140/88 | HR 69 | Wt 130.8 lb

## 2015-04-09 DIAGNOSIS — M109 Gout, unspecified: Secondary | ICD-10-CM | POA: Diagnosis not present

## 2015-04-09 DIAGNOSIS — Z79899 Other long term (current) drug therapy: Secondary | ICD-10-CM | POA: Insufficient documentation

## 2015-04-09 DIAGNOSIS — Z885 Allergy status to narcotic agent status: Secondary | ICD-10-CM | POA: Diagnosis not present

## 2015-04-09 DIAGNOSIS — E785 Hyperlipidemia, unspecified: Secondary | ICD-10-CM | POA: Insufficient documentation

## 2015-04-09 DIAGNOSIS — I428 Other cardiomyopathies: Secondary | ICD-10-CM | POA: Insufficient documentation

## 2015-04-09 DIAGNOSIS — Z881 Allergy status to other antibiotic agents status: Secondary | ICD-10-CM | POA: Insufficient documentation

## 2015-04-09 DIAGNOSIS — I5022 Chronic systolic (congestive) heart failure: Secondary | ICD-10-CM | POA: Diagnosis present

## 2015-04-09 DIAGNOSIS — E039 Hypothyroidism, unspecified: Secondary | ICD-10-CM | POA: Insufficient documentation

## 2015-04-09 DIAGNOSIS — I130 Hypertensive heart and chronic kidney disease with heart failure and stage 1 through stage 4 chronic kidney disease, or unspecified chronic kidney disease: Secondary | ICD-10-CM | POA: Insufficient documentation

## 2015-04-09 DIAGNOSIS — Z95810 Presence of automatic (implantable) cardiac defibrillator: Secondary | ICD-10-CM | POA: Insufficient documentation

## 2015-04-09 DIAGNOSIS — I481 Persistent atrial fibrillation: Secondary | ICD-10-CM

## 2015-04-09 DIAGNOSIS — Z888 Allergy status to other drugs, medicaments and biological substances status: Secondary | ICD-10-CM | POA: Diagnosis not present

## 2015-04-09 DIAGNOSIS — I482 Chronic atrial fibrillation: Secondary | ICD-10-CM | POA: Insufficient documentation

## 2015-04-09 DIAGNOSIS — Z8249 Family history of ischemic heart disease and other diseases of the circulatory system: Secondary | ICD-10-CM | POA: Insufficient documentation

## 2015-04-09 DIAGNOSIS — Z87891 Personal history of nicotine dependence: Secondary | ICD-10-CM | POA: Insufficient documentation

## 2015-04-09 DIAGNOSIS — Z7901 Long term (current) use of anticoagulants: Secondary | ICD-10-CM | POA: Insufficient documentation

## 2015-04-09 DIAGNOSIS — I4819 Other persistent atrial fibrillation: Secondary | ICD-10-CM

## 2015-04-09 DIAGNOSIS — I10 Essential (primary) hypertension: Secondary | ICD-10-CM | POA: Diagnosis not present

## 2015-04-09 DIAGNOSIS — N184 Chronic kidney disease, stage 4 (severe): Secondary | ICD-10-CM | POA: Insufficient documentation

## 2015-04-09 NOTE — Progress Notes (Signed)
Patient ID: Sandra [name], female   DOB: 06/21/1932, 82 y.o.   MRN: [id number]    Advanced Heart Failure Clinic Note   Primary Care: Dr Yong Channel Primary EP: Dr. Caryl Comes Primary HF: Dr. Haroldine Laws   HPI:  Sandra [name] is a 82 y.o. female with h/o PAF, hypothyroidism, St Jude CRT-D CKD stage 4 and chronic systolic HF due to NICM with EF 25%. Tikosyn previously held with worsening renal function.   Admitted 10/29/14 after [place] with cardiogenic shock. Functional decline correlated with recurrence of A fib.  Placed on milrinone and diuresed with IV lasix. Due to persistent low output she was switched from milrinone to dobutamine with improved hemodynamics. Dobutamine was unable to be weaned.  Continued on dobutamine 5 mcg via [facility].  No ace or bb with low output/CKD.  EP consulted for AVN ablation; Deferred in favor of Tikosyn and repeat DC-CV. Tikosyn was reloaded and cardioversion initially successful, but reverted to A fib with controlled rate prior to d/c. Tikosyn stopped. [facility]. Overall she diuresed 11 pounds with IV lasix and was transitioned to 40 mg daily torsemide. Discharge weight 140  Underwent AVN ablation on 02/06/15.  Admitted 03/08/15 with a bad aspirational pneumonia. HF initially stable but became volume overloaded with PNA treatment. Was much improved with IV lasix with weight down ~10 lbs from highest weight that admission and downtrend in creatinine. No changes to her dobutamine were made. Discharge weight 140 lbs.  She presents today for regular follow up. Her weight is down 12 lbs from last clinic visit. Just got home home from [facility] for rehab, said the food was very salty. Was feeling bad over the weekend and took 2.5 mg of metolazone and peed all night. Says she feels better, Doesn't think she could've walked in here on Sunday, but now doing OK. Weight at home 129.8 this morning. Was 135 on on Sunday.  Working with PT at home and feels like she's  getting better. Breathing has been doing great apart from this weekend.  Denies orthopnea or PND. Remains on dobutamine 4 mcg/kg/min.   Corvue: Office device malfunction.   [place] 10/29/2014  RA = 19 RV = 70/9/19 PA = 69/31 (45) PCW = 27 Fick cardiac output/index = 2.3/1.4 Thermo cardiac output/index = 2.3/1.4 PVR = 7.8 WU Ao sat = 94% PA sat = 35%, 38%  Labs 11/25/14 K 4.8, Cr 1.7 Labs 04/08/15 K 3.5, creatinine 1.9  Past Medical History  Diagnosis Date  . Atrial fibrillation ([place]) permanent     on Tikosyn and Coumadin  . Hyperlipidemia   . Hypothyroidism   . Bradycardia   . Cardiomyopathy, hypertrophic nonobstructive (HCC)     ejection fraction 25%  . Anal fissure   . Adenomatous colon polyp   . Diverticulosis   . ICD-CRT     generator change 2013  . Systolic heart failure   . Hyperlipidemia   . Hypertension   . RBBB plus LA hemiblock   . Family history of adverse reaction to anesthesia     "middle daughter couldn't take Versed"  . CHF (congestive heart failure) ([place])   . Varicose veins   . Chronic bronchitis ([place])     "get it most q spring and fall" (02/06/2015)  . Migraines     "stopped after I went thru the change"  . History of gout   . Situational anxiety   . Chronic renal insufficiency, stage III (moderate)     gd  3-4  . Basal cell carcinoma     "right foot; right leg" (02/06/2015)  . Aspiration pneumonia ([place]) 03/08/2015    Archie Endo 03/08/2015    Current Outpatient Prescriptions  Medication Sig Dispense Refill  . acetaminophen (TYLENOL) 325 MG tablet Take 650 mg by mouth every 6 (six) hours as needed for mild pain or headache. Reported on 03/07/2015    . DOBUTamine (DOBUTREX) 4-5 MG/ML-% infusion Inject 330 mcg/min into the vein continuous. 250 mL 6  . ezetimibe (ZETIA) 10 MG tablet Take 10 mg by mouth every Monday, Wednesday, and Friday.     . fluticasone (FLONASE) 50 MCG/ACT nasal spray Place 2 sprays into both nostrils daily. 16 g 6  . levothyroxine  (SYNTHROID, LEVOTHROID) 100 MCG tablet Take 100 mcg by mouth at bedtime.    . potassium chloride (K-DUR) 10 MEQ tablet Take 10 mEq by mouth 2 (two) times daily.    . simethicone (MYLICON) 80 MG chewable tablet Chew 1 tablet (80 mg total) by mouth every 6 (six) hours as needed for flatulence. 30 tablet 0  . torsemide (DEMADEX) 20 MG tablet Take 2 tablets (40 mg total) by mouth 2 (two) times daily. 120 tablet 6  . triamcinolone cream (KENALOG) 0.1 % Apply 1 application topically 2 (two) times daily as needed (itching/rash).    Marland Kitchen VITAMIN E PO Take 1 tablet by mouth daily.    Marland Kitchen warfarin (COUMADIN) 5 MG tablet Take 0.5-1 tablets (2.5-5 mg total) by mouth as directed. Take 2.5 mg (1/2 tablet) daily except 5 mg (1 tablet) on Monday 30 tablet 0   No current facility-administered medications for this encounter.    Allergies  Allergen Reactions  . Levofloxacin Other (See Comments)    Due to cardiac  AF  . Procaine Hcl Other (See Comments)    REACTION: smothers  . Spironolactone Diarrhea    Abdominal pain  . Amiodarone Nausea And Vomiting  . Tape Other (See Comments)    Tears skin off.  Please use "paper" tape only  . Amoxicillin Other (See Comments)    Rash   . Codeine Other (See Comments)    REACTION: nausea  . Niacin Other (See Comments)    REACTION: rash  . Ramipril Other (See Comments)    unknown  . Statins Other (See Comments)    REACTION: muscle aches      Social History   Social History  . Marital Status: Married    Spouse Name: N/A  . Number of Children: 3  . Years of Education: N/A   Occupational History  . Retired     Probation officer   Social History Main Topics  . Smoking status: Former Smoker -- 0.20 packs/day for 19 years    Types: Cigarettes    Quit date: 01/13/1987  . Smokeless tobacco: Never Used  . Alcohol Use: No  . Drug Use: No  . Sexual Activity: No   Other Topics Concern  . Not on file   Social History Narrative   Married 1953 (husband Sandra [name] in  our Network engineer). 3 girls (1 is a patient here-Sandra [name]). 2 grandkids.       Retired in [place]: very active, going to church and church friends, movies     Family History  Problem Relation Age of Onset  . Heart disease Mother   . Colon cancer Father   . COPD Father   . Heart disease Father   . Cardiomyopathy Daughter   .  Breast cancer Paternal Aunt   . Colon polyps Father   . Diabetes Neg Hx   . Kidney disease Neg Hx     Filed Vitals:   04/09/15 1151  BP: 140/88  Pulse: 69  Weight: 130 lb 12.8 oz (59.33 kg)  SpO2: 96%   Wt Readings from Last 3 Encounters:  04/09/15 130 lb 12.8 oz (59.33 kg)  04/04/15 136 lb (61.689 kg)  03/19/15 142 lb (64.411 kg)      PHYSICAL EXAM: General:  Elderly appearing. NAD HEENT: normal Neck: supple. JVP 7-8. Carotids 2+ bilat; no bruits. No thyromegaly or nodule noted. Cor: PMI laterally displaced. regular Lungs: Scant crackles that clear with deep breathing. Abdomen: soft, mildly tender diffusely, but states improved from weekend, ND, no HSM. No bruits or masses. +BS  Extremities: no cyanosis, clubbing, rash, no edema. Multiple tophi bilateral hands. Neuro: alert & oriented x 3, cranial nerves grossly intact. moves all 4 extremities w/o difficulty. Affect pleasant. Skin: no rash.  ASSESSMENT & PLAN:  1. Chronic systolic HF due to NICM, Echo 10/2014 EF 20-25% s/p St Jude CRT-D  - Volume status mildly elevated. Office Corvue device is not functioning correctly so will have patient send one from home   - Contnue torsemide 40 BID. Continue to take 10 meq K BID. Refuses to take more.  K 3.5 yesterday, likely due to not taking extra supp with Metolazone.  - Continue to take 2.5 mg metolazone only as needed for weight gain of 3 lbs overnight or 5 lbs within a week with extra 20 meq of K when taking metolazone.  - Continue daily weights, low salt diet, and fluid restrict to 2L.  2. Aspiration Pneumonia - Much  improved.  Still fatigued and deconditioned but improving.  - Saw Dr Yong Channel 04/04/15. Stable report.  3. PAF -S/P DC-CV 10/28 but later back in A fib s/p AV node ablation. Failed amio and Tikosyn.  - Chronic afib by corevue ( AT/AF burden > 99%) 4. CKD, stage 3-4 -stable.  - Stable on BMET yesterday.  5. Hypothyroidism - Per PCP 6. Gout - Has appointment with Dr Beakman/Rheumatology office in April to consider uloric.   Shirley Friar, PA-C 12:10 PM  Total time spent: 30 minutes. Over half of that discussing the above.   Patient seen and examined with Oda Kilts, PA-C. We discussed all aspects of the encounter. I agree with the assessment and plan as stated above.   Overall doing well on dobutamine. Volume status mildly elevated but improving. Continue with prn metolazone. 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 AF is chronic now s/p AVN ablation. Continue warfarin.   Lesleyann Fichter,MD 2:57 PM

## 2015-04-09 NOTE — Patient Instructions (Signed)
Follow up in 6-8 weeks with Dr.Bensimhon  

## 2015-04-12 ENCOUNTER — Other Ambulatory Visit (HOSPITAL_COMMUNITY): Payer: Self-pay | Admitting: Internal Medicine

## 2015-04-15 ENCOUNTER — Telehealth: Payer: Self-pay | Admitting: Family Medicine

## 2015-04-15 ENCOUNTER — Ambulatory Visit (INDEPENDENT_AMBULATORY_CARE_PROVIDER_SITE_OTHER): Payer: Medicare Other | Admitting: General Practice

## 2015-04-15 DIAGNOSIS — Z5181 Encounter for therapeutic drug level monitoring: Secondary | ICD-10-CM

## 2015-04-15 LAB — POCT INR: INR: 2.4

## 2015-04-15 NOTE — Progress Notes (Signed)
Pre visit review using our clinic review tool, if applicable. No additional management support is needed unless otherwise documented below in the visit note. 

## 2015-04-15 NOTE — Telephone Encounter (Signed)
Dosing instructions given to Kathlee Nations, RN @ [facility] and to patient.

## 2015-04-15 NOTE — Telephone Encounter (Signed)
Kathlee Nations from [facility] called to give patient INR.  INR: 2.4  Kathlee Nations stated to give her call back and it ok to leave a voicemail with the order and to give the patient a call as well.  Kathlee Nations, [place] [phone]

## 2015-04-16 ENCOUNTER — Other Ambulatory Visit: Payer: Self-pay | Admitting: Family Medicine

## 2015-04-23 ENCOUNTER — Other Ambulatory Visit (HOSPITAL_COMMUNITY)
Admission: AD | Admit: 2015-04-23 | Discharge: 2015-04-23 | Disposition: A | Discharge status: Home / Self Care | Payer: Medicare Other | Source: Other Acute Inpatient Hospital | Attending: Internal Medicine | Admitting: Internal Medicine

## 2015-04-23 ENCOUNTER — Ambulatory Visit (INDEPENDENT_AMBULATORY_CARE_PROVIDER_SITE_OTHER): Payer: Medicare Other | Admitting: General Practice

## 2015-04-23 DIAGNOSIS — Z5181 Encounter for therapeutic drug level monitoring: Secondary | ICD-10-CM

## 2015-04-23 DIAGNOSIS — Z029 Encounter for administrative examinations, unspecified: Secondary | ICD-10-CM | POA: Insufficient documentation

## 2015-04-23 LAB — CBC WITH DIFFERENTIAL/PLATELET
Basophils Absolute: 0 10*3/uL (ref 0.0–0.1)
Basophils Relative: 0 %
EOS ABS: 0.1 10*3/uL (ref 0.0–0.7)
EOS PCT: 2 %
HCT: 38 % (ref 36.0–46.0)
Hemoglobin: 11.3 g/dL — ABNORMAL LOW (ref 12.0–15.0)
LYMPHS ABS: 0.6 10*3/uL — AB (ref 0.7–4.0)
LYMPHS PCT: 13 %
MCH: 24 pg — AB (ref 26.0–34.0)
MCHC: 29.7 g/dL — ABNORMAL LOW (ref 30.0–36.0)
MCV: 80.7 fL (ref 78.0–100.0)
MONO ABS: 0.4 10*3/uL (ref 0.1–1.0)
Monocytes Relative: 10 %
Neutro Abs: 3.5 10*3/uL (ref 1.7–7.7)
Neutrophils Relative %: 75 %
PLATELETS: 248 10*3/uL (ref 150–400)
RBC: 4.71 MIL/uL (ref 3.87–5.11)
RDW: 20.8 % — AB (ref 11.5–15.5)
WBC: 4.6 10*3/uL (ref 4.0–10.5)

## 2015-04-23 LAB — BASIC METABOLIC PANEL
Anion gap: 12 (ref 5–15)
BUN: 40 mg/dL — AB (ref 6–20)
CHLORIDE: 94 mmol/L — AB (ref 101–111)
CO2: 31 mmol/L (ref 22–32)
CREATININE: 1.83 mg/dL — AB (ref 0.44–1.00)
Calcium: 9.2 mg/dL (ref 8.9–10.3)
GFR calc Af Amer: 29 mL/min — ABNORMAL LOW (ref >60)
GFR, EST NON AFRICAN AMERICAN: 25 mL/min — AB (ref >60)
GLUCOSE: 63 mg/dL — AB (ref 65–99)
POTASSIUM: 3 mmol/L — AB (ref 3.5–5.1)
SODIUM: 137 mmol/L (ref 135–145)

## 2015-04-23 LAB — MAGNESIUM: MAGNESIUM: 2.3 mg/dL (ref 1.7–2.4)

## 2015-04-23 LAB — POCT INR: INR: 2.5

## 2015-04-23 NOTE — Progress Notes (Signed)
Pre visit review using our clinic review tool, if applicable. No additional management support is needed unless otherwise documented below in the visit note. 

## 2015-04-24 ENCOUNTER — Telehealth (HOSPITAL_COMMUNITY): Payer: Self-pay | Admitting: Student

## 2015-04-24 NOTE — Telephone Encounter (Signed)
K 3.5 -> 3.0.   Previously has refused to increase her potassium.   She needs to at least double what she has been doing.  She is listed as 10 meq BID. Should increase to at least 20 meq BID.   Needs recheck BMET next week.     Legrand Como [address]" [place], PA-C 04/24/2015 4:09 PM

## 2015-05-01 NOTE — Telephone Encounter (Signed)
PATIENT AWARE OF LAB RESULTS PATIENT REPORTS SHE WILL TRY TO INCREASE MEDICATIONS HOWEVER HAS A HISTORY WITH GETTING VERY NAUSEATED

## 2015-05-03 ENCOUNTER — Other Ambulatory Visit (HOSPITAL_COMMUNITY): Payer: Self-pay | Admitting: Internal Medicine

## 2015-05-08 ENCOUNTER — Telehealth: Payer: Self-pay | Admitting: Family Medicine

## 2015-05-08 ENCOUNTER — Ambulatory Visit (INDEPENDENT_AMBULATORY_CARE_PROVIDER_SITE_OTHER): Payer: Medicare Other | Admitting: General Practice

## 2015-05-08 DIAGNOSIS — Z5181 Encounter for therapeutic drug level monitoring: Secondary | ICD-10-CM

## 2015-05-08 LAB — POCT INR: INR: 2.5

## 2015-05-08 NOTE — Progress Notes (Signed)
I have reviewed and agree with the plan. 

## 2015-05-08 NOTE — Telephone Encounter (Signed)
New orders and dosing instructions called to Kathlee Nations, RN @ [facility] and to patient.  Patient did verbalize understanding.

## 2015-05-08 NOTE — Telephone Encounter (Signed)
Can you help [place] Sandra [name]? Happy to place any needed orders

## 2015-05-08 NOTE — Progress Notes (Signed)
Pre visit review using our clinic review tool, if applicable. No additional management support is needed unless otherwise documented below in the visit note. 

## 2015-05-08 NOTE — Telephone Encounter (Signed)
Kathlee Nations from [facility] [phone]) called to let you know that the patients  INR was 2.5. Kathlee Nations wants you to call her and the patient back, so she can know when to put the orders in and the patient needs to know when to take her medicine.

## 2015-05-08 NOTE — Telephone Encounter (Signed)
See below

## 2015-05-10 ENCOUNTER — Other Ambulatory Visit (HOSPITAL_COMMUNITY): Payer: Self-pay | Admitting: Internal Medicine

## 2015-05-13 LAB — CUP PACEART REMOTE DEVICE CHECK
Battery Remaining Longevity: 35 mo
Battery Remaining Percentage: 43 %
Date Time Interrogation Session: 20170318153135
HIGH POWER IMPEDANCE MEASURED VALUE: 35 Ohm
Implantable Lead Implant Date: 20071101
Implantable Lead Implant Date: 20071101
Implantable Lead Model: 7000
Implantable Lead Serial Number: 166326
Lead Channel Impedance Value: 450 Ohm
Lead Channel Setting Pacing Amplitude: 2 V
Lead Channel Setting Sensing Sensitivity: 0.5 mV
MDC IDC LEAD IMPLANT DT: 20071101
MDC IDC LEAD LOCATION: 753858
MDC IDC LEAD LOCATION: 753859
MDC IDC LEAD LOCATION: 753860
MDC IDC MSMT BATTERY VOLTAGE: 2.92 V
MDC IDC MSMT LEADCHNL RA IMPEDANCE VALUE: 390 Ohm
MDC IDC MSMT LEADCHNL RV IMPEDANCE VALUE: 350 Ohm
MDC IDC MSMT LEADCHNL RV SENSING INTR AMPL: 11.3 mV
MDC IDC SET LEADCHNL LV PACING PULSEWIDTH: 0.5 ms
MDC IDC SET LEADCHNL RV PACING AMPLITUDE: 2.5 V
MDC IDC SET LEADCHNL RV PACING PULSEWIDTH: 0.5 ms
Pulse Gen Serial Number: 7032457

## 2015-05-17 ENCOUNTER — Ambulatory Visit: Payer: Medicare Other | Admitting: Family Medicine

## 2015-05-20 ENCOUNTER — Ambulatory Visit (INDEPENDENT_AMBULATORY_CARE_PROVIDER_SITE_OTHER): Payer: Medicare Other | Admitting: General Practice

## 2015-05-20 ENCOUNTER — Other Ambulatory Visit (HOSPITAL_COMMUNITY)
Admission: RE | Admit: 2015-05-20 | Discharge: 2015-05-20 | Disposition: A | Discharge status: Home / Self Care | Payer: Medicare Other | Source: Other Acute Inpatient Hospital | Attending: Internal Medicine | Admitting: Internal Medicine

## 2015-05-20 DIAGNOSIS — Z5181 Encounter for therapeutic drug level monitoring: Secondary | ICD-10-CM | POA: Insufficient documentation

## 2015-05-20 DIAGNOSIS — I5023 Acute on chronic systolic (congestive) heart failure: Secondary | ICD-10-CM | POA: Diagnosis present

## 2015-05-20 LAB — POCT INR: INR: 2.4

## 2015-05-20 LAB — CBC
HCT: 36.5 % (ref 36.0–46.0)
Hemoglobin: 11 g/dL — ABNORMAL LOW (ref 12.0–15.0)
MCH: 24.8 pg — AB (ref 26.0–34.0)
MCHC: 30.1 g/dL (ref 30.0–36.0)
MCV: 82.4 fL (ref 78.0–100.0)
PLATELETS: 205 10*3/uL (ref 150–400)
RBC: 4.43 MIL/uL (ref 3.87–5.11)
RDW: 20.5 % — AB (ref 11.5–15.5)
WBC: 4.8 10*3/uL (ref 4.0–10.5)

## 2015-05-20 LAB — BASIC METABOLIC PANEL
ANION GAP: 13 (ref 5–15)
BUN: 36 mg/dL — AB (ref 6–20)
CALCIUM: 9.1 mg/dL (ref 8.9–10.3)
CO2: 26 mmol/L (ref 22–32)
CREATININE: 1.84 mg/dL — AB (ref 0.44–1.00)
Chloride: 99 mmol/L — ABNORMAL LOW (ref 101–111)
GFR calc Af Amer: 29 mL/min — ABNORMAL LOW (ref >60)
GFR, EST NON AFRICAN AMERICAN: 25 mL/min — AB (ref >60)
GLUCOSE: 86 mg/dL (ref 65–99)
Potassium: 4.3 mmol/L (ref 3.5–5.1)
Sodium: 138 mmol/L (ref 135–145)

## 2015-05-20 LAB — MAGNESIUM: MAGNESIUM: 2.3 mg/dL (ref 1.7–2.4)

## 2015-05-20 NOTE — Progress Notes (Signed)
Pre visit review using our clinic review tool, if applicable. No additional management support is needed unless otherwise documented below in the visit note. 

## 2015-05-23 ENCOUNTER — Ambulatory Visit (HOSPITAL_BASED_OUTPATIENT_CLINIC_OR_DEPARTMENT_OTHER)
Admission: RE | Admit: 2015-05-23 | Discharge: 2015-05-23 | Disposition: A | Discharge status: Home / Self Care | Payer: Medicare Other | Source: Ambulatory Visit | Attending: Internal Medicine | Admitting: Internal Medicine

## 2015-05-23 ENCOUNTER — Encounter (HOSPITAL_COMMUNITY): Payer: Self-pay | Admitting: Internal Medicine

## 2015-05-23 VITALS — BP 128/78 | HR 72 | Resp 18 | Wt 135.8 lb

## 2015-05-23 DIAGNOSIS — Z881 Allergy status to other antibiotic agents status: Secondary | ICD-10-CM

## 2015-05-23 DIAGNOSIS — I428 Other cardiomyopathies: Secondary | ICD-10-CM | POA: Insufficient documentation

## 2015-05-23 DIAGNOSIS — Z95810 Presence of automatic (implantable) cardiac defibrillator: Secondary | ICD-10-CM | POA: Insufficient documentation

## 2015-05-23 DIAGNOSIS — E039 Hypothyroidism, unspecified: Secondary | ICD-10-CM | POA: Insufficient documentation

## 2015-05-23 DIAGNOSIS — E785 Hyperlipidemia, unspecified: Secondary | ICD-10-CM

## 2015-05-23 DIAGNOSIS — I130 Hypertensive heart and chronic kidney disease with heart failure and stage 1 through stage 4 chronic kidney disease, or unspecified chronic kidney disease: Secondary | ICD-10-CM

## 2015-05-23 DIAGNOSIS — Z79899 Other long term (current) drug therapy: Secondary | ICD-10-CM

## 2015-05-23 DIAGNOSIS — Z888 Allergy status to other drugs, medicaments and biological substances status: Secondary | ICD-10-CM

## 2015-05-23 DIAGNOSIS — Z7901 Long term (current) use of anticoagulants: Secondary | ICD-10-CM | POA: Insufficient documentation

## 2015-05-23 DIAGNOSIS — M109 Gout, unspecified: Secondary | ICD-10-CM | POA: Insufficient documentation

## 2015-05-23 DIAGNOSIS — I5022 Chronic systolic (congestive) heart failure: Secondary | ICD-10-CM | POA: Diagnosis not present

## 2015-05-23 DIAGNOSIS — I482 Chronic atrial fibrillation: Secondary | ICD-10-CM | POA: Insufficient documentation

## 2015-05-23 DIAGNOSIS — I480 Paroxysmal atrial fibrillation: Secondary | ICD-10-CM | POA: Insufficient documentation

## 2015-05-23 DIAGNOSIS — Z87891 Personal history of nicotine dependence: Secondary | ICD-10-CM | POA: Insufficient documentation

## 2015-05-23 DIAGNOSIS — N184 Chronic kidney disease, stage 4 (severe): Secondary | ICD-10-CM

## 2015-05-23 DIAGNOSIS — I481 Persistent atrial fibrillation: Secondary | ICD-10-CM | POA: Diagnosis not present

## 2015-05-23 DIAGNOSIS — Z85828 Personal history of other malignant neoplasm of skin: Secondary | ICD-10-CM

## 2015-05-23 DIAGNOSIS — I4819 Other persistent atrial fibrillation: Secondary | ICD-10-CM

## 2015-05-23 DIAGNOSIS — Z825 Family history of asthma and other chronic lower respiratory diseases: Secondary | ICD-10-CM | POA: Insufficient documentation

## 2015-05-23 DIAGNOSIS — Z8249 Family history of ischemic heart disease and other diseases of the circulatory system: Secondary | ICD-10-CM

## 2015-05-23 DIAGNOSIS — Z885 Allergy status to narcotic agent status: Secondary | ICD-10-CM

## 2015-05-23 DIAGNOSIS — J690 Pneumonitis due to inhalation of food and vomit: Secondary | ICD-10-CM | POA: Diagnosis not present

## 2015-05-23 DIAGNOSIS — M5489 Other dorsalgia: Secondary | ICD-10-CM | POA: Diagnosis not present

## 2015-05-23 DIAGNOSIS — N183 Chronic kidney disease, stage 3 (moderate): Secondary | ICD-10-CM

## 2015-05-23 MED ORDER — TORSEMIDE 20 MG PO TABS: 40.0000 mg | ORAL_TABLET | Freq: Two times a day (BID) | ORAL | Status: AC

## 2015-05-23 MED ORDER — DOBUTAMINE IN D5W 4-5 MG/ML-% IV SOLN: 4.0000 ug/kg/min | INTRAVENOUS | Status: AC

## 2015-05-23 MED ORDER — APIXABAN 2.5 MG PO TABS: 2.5000 mg | ORAL_TABLET | Freq: Two times a day (BID) | ORAL | Status: AC

## 2015-05-23 NOTE — Patient Instructions (Addendum)
Stop Coumadin.  Start Eliquis 2.5mg  twice daily. (start 2 days after stopping coumadin) START DATE :SATURDAY 05/25/15  Take Extra Torsemide 20mg  as needed for fluid/edema. (with evening medication)  Follow up with Dr.Bensimhon in 6 weeks.   *Order faxed to [place] to decrease DOBUTamine to 59mcg/kg/min

## 2015-05-23 NOTE — Addendum Note (Signed)
Encounter addended by: Harvie Junior, CMA on: 05/23/2015 12:10 PM<BR>     Documentation filed: Medications, Patient Instructions Section, Orders

## 2015-05-23 NOTE — Progress Notes (Signed)
Patient ID: Sandra [name], female   DOB: 01-16-1932, 82 y.o.   MRN: [id number] Patient ID: Sandra [name], female   DOB: 1932/05/10, 82 y.o.   MRN: [id number]    Advanced Heart Failure Clinic Note   Primary Care: Dr Yong Channel Primary EP: Dr. Caryl Comes Primary HF: Dr. Haroldine Laws   HPI:  Sandra [name] is a 82 y.o. female with h/o PAF, hypothyroidism, St Jude CRT-D CKD stage 4 and chronic systolic HF due to NICM with EF 25%. Tikosyn previously held with worsening renal function.   Admitted 10/29/14 after Sandra [name] with cardiogenic shock. Functional decline correlated with recurrence of A fib.  Placed on milrinone and diuresed with IV lasix. Due to persistent low output she was switched from milrinone to dobutamine with improved hemodynamics. Dobutamine was unable to be weaned.  Continued on dobutamine 5 mcg via [facility].  No ace or bb with low output/CKD.  EP consulted for AVN ablation; Deferred in favor of Tikosyn and repeat DC-CV. Tikosyn was reloaded and cardioversion initially successful, but reverted to A fib with controlled rate prior to d/c. Tikosyn stopped. [facility]  [facility]. Overall she diuresed 11 pounds with IV lasix and was transitioned to 40 mg daily torsemide. Discharge weight 140  Underwent AVN ablation on 02/06/15.  Admitted 03/08/15 with a bad aspirational pneumonia. HF initially stable but became volume overloaded with PNA treatment. Was much improved with IV lasix with weight down ~10 lbs from highest weight that admission and downtrend in creatinine. No changes to her dobutamine were made. Discharge weight 140 lbs.  She presents today for regular follow up. Remains on dobutamine 5 mcg/kg/min. Doing well. Now doing household chores without too much difficulty.  Her weight is up 5 lbs from last clinic visit.  Denies orthopnea or PND. Has taken 1/4 of a tablet of metolazone 2x in the last 6 weeks with very brisk diuresis.   Corvue: Chronic AF. Volume trending up    RHC 10/29/2014   RA = 19 RV = 70/9/19 PA = 69/31 (45) PCW = 27 Fick cardiac output/index = 2.3/1.4 Thermo cardiac output/index = 2.3/1.4 PVR = 7.8 WU Ao sat = 94% PA sat = 35%, 38%  Labs 11/25/14 K 4.8, Cr 1.7 Labs 04/08/15 K 3.5, creatinine 1.9 Labs 05/20/15 k 4.3 creatinine 1.84  Past Medical History  Diagnosis Date  . Atrial fibrillation (Sandra [name]) permanent     on Tikosyn and Coumadin  . Hyperlipidemia   . Hypothyroidism   . Bradycardia   . Cardiomyopathy, hypertrophic nonobstructive (HCC)     ejection fraction 25%  . Anal fissure   . Adenomatous colon polyp   . Diverticulosis   . ICD-CRT     generator change 2013  . Systolic heart failure   . Hyperlipidemia   . Hypertension   . RBBB plus LA hemiblock   . Family history of adverse reaction to anesthesia     "middle daughter couldn't take Versed"  . CHF (congestive heart failure) (Sandra [name])   . Varicose veins   . Chronic bronchitis (Sandra [name])     "get it most q spring and fall" (02/06/2015)  . Migraines     "stopped after I went thru the change"  . History of gout   . Situational anxiety   . Chronic renal insufficiency, stage III (moderate)     gd 3-4  . Basal cell carcinoma     "right foot; right leg" (02/06/2015)  . Aspiration pneumonia (Sandra [name]) 03/08/2015    Archie Endo  03/08/2015    Current Outpatient Prescriptions  Medication Sig Dispense Refill  . acetaminophen (TYLENOL) 325 MG tablet Take 650 mg by mouth every 6 (six) hours as needed for mild pain or headache. Reported on 03/07/2015    . DOBUTamine (DOBUTREX) 4-5 MG/ML-% infusion Inject 330 mcg/min into the vein continuous. 250 mL 6  . ezetimibe (ZETIA) 10 MG tablet Take 10 mg by mouth every Monday, Wednesday, and Friday.     . fluticasone (FLONASE) 50 MCG/ACT nasal spray Place 2 sprays into both nostrils daily. 16 g 6  . levothyroxine (SYNTHROID, LEVOTHROID) 100 MCG tablet Take 100 mcg by mouth at bedtime.    . potassium chloride (K-DUR,KLOR-CON) 10 MEQ tablet Take 20 mEq by mouth 2 (two) times  daily.    Marland Kitchen PROAIR HFA 108 (90 Base) MCG/ACT inhaler INHALE TWO PUFFS INTO LUNGS EVERY 6 HOURS AS NEEDED FOR WHEEZING OR  SHORTNESS OF BREATH 9 each 3  . simethicone (MYLICON) 80 MG chewable tablet Chew 1 tablet (80 mg total) by mouth every 6 (six) hours as needed for flatulence. 30 tablet 0  . torsemide (DEMADEX) 20 MG tablet Take 2 tablets (40 mg total) by mouth 2 (two) times daily. 120 tablet 6  . triamcinolone cream (KENALOG) 0.1 % Apply 1 application topically 2 (two) times daily as needed (itching/rash).    Marland Kitchen VITAMIN E PO Take 1 tablet by mouth daily.    Marland Kitchen warfarin (COUMADIN) 5 MG tablet Take 0.5-1 tablets (2.5-5 mg total) by mouth as directed. Take 2.5 mg (1/2 tablet) daily except 5 mg (1 tablet) on Monday 30 tablet 0   No current facility-administered medications for this encounter.    Allergies  Allergen Reactions  . Levofloxacin Other (See Comments)    Due to cardiac  AF  . Procaine Hcl Other (See Comments)    REACTION: smothers  . Spironolactone Diarrhea    Abdominal pain  . Amiodarone Nausea And Vomiting  . Tape Other (See Comments)    Tears skin off.  Please use "paper" tape only  . Amoxicillin Other (See Comments)    Rash   . Codeine Other (See Comments)    REACTION: nausea  . Niacin Other (See Comments)    REACTION: rash  . Ramipril Other (See Comments)    unknown  . Statins Other (See Comments)    REACTION: muscle aches      Social History   Social History  . Marital Status: Married    Spouse Name: N/A  . Number of Children: 3  . Years of Education: N/A   Occupational History  . Retired     Probation officer   Social History Main Topics  . Smoking status: Former Smoker -- 0.20 packs/day for 19 years    Types: Cigarettes    Quit date: 01/13/1987  . Smokeless tobacco: Never Used  . Alcohol Use: No  . Drug Use: No  . Sexual Activity: No   Other Topics Concern  . Not on file   Social History Narrative   Married 1953 (husband Sandra [name] in our Network engineer).  3 girls (1 is a patient here-Sandra [name]). 2 grandkids.       Retired in [place]: very active, going to church and church friends, movies     Family History  Problem Relation Age of Onset  . Heart disease Mother   . Colon cancer Father   . COPD Father   . Heart disease Father   .  Cardiomyopathy Daughter   . Breast cancer Paternal Aunt   . Colon polyps Father   . Diabetes Neg Hx   . Kidney disease Neg Hx     Filed Vitals:   05/23/15 1043  BP: 128/78  Pulse: 72  Resp: 18  Weight: 135 lb 12 oz (61.576 kg)  SpO2: 95%   Wt Readings from Last 3 Encounters:  05/23/15 135 lb 12 oz (61.576 kg)  04/09/15 130 lb 12.8 oz (59.33 kg)  04/04/15 136 lb (61.689 kg)      PHYSICAL EXAM: General:  Elderly appearing. NAD HEENT: normal Neck: supple. JVP 9. Carotids 2+ bilat; no bruits. No thyromegaly or nodule noted. Cor: PMI laterally displaced. regular Lungs: Clear Abdomen: soft, mildly tender diffusely, but states improved from weekend, ND, no HSM. No bruits or masses. +BS  Extremities: no cyanosis, clubbing, rash, no edema. Multiple tophi bilateral hands. RUE PICC Neuro: alert & oriented x 3, cranial nerves grossly intact. moves all 4 extremities w/o difficulty. Affect pleasant. Skin: no rash.  ASSESSMENT & PLAN:  1. Chronic systolic HF due to NICM, Echo 10/2014 EF 20-25% s/p St Jude CRT-D  - Much improved. Has been on dobutamine since 10/16. Now NYHA II-III. Will try to decrease dobutamine very slowly. Titrate down to 4 mcg/kg/min and see how she tolerates. Can consider addition of hydralazine at next visit but I just want to do one thing at a time given her sensitivities to medicine.  - Volume status mildly elevated on exam and Corevue. Can take extra 20mg  of torsemide as needed.save metolazone only for emergencies.  - Continue daily weights, low salt diet, and fluid restrict to 2L.  2. PAF -S/P DC-CV 10/28 but later back in A fib s/p AV node ablation.  Failed amio and Tikosyn.  - Chronic afib by corevue ( AT/AF burden > 99%). On coumadin. Will switch to Eliquis 2.5 bid.  3. CKD, stage 3-4 -stable.  - Stable on BMET 4. Hypothyroidism - Per PCP 5. Gout - Follows with Dr Beakman/Rheumatology  RTC in 6 weeks.   Glori Bickers, MD 11:34 AM

## 2015-05-24 ENCOUNTER — Encounter (HOSPITAL_BASED_OUTPATIENT_CLINIC_OR_DEPARTMENT_OTHER): Payer: Self-pay | Admitting: Emergency Medicine

## 2015-05-24 ENCOUNTER — Inpatient Hospital Stay (HOSPITAL_BASED_OUTPATIENT_CLINIC_OR_DEPARTMENT_OTHER)
Admission: EM | Admit: 2015-05-24 | Discharge: 2015-06-13 | DRG: 177 | Disposition: E | Payer: Medicare Other | Attending: Internal Medicine | Admitting: Internal Medicine

## 2015-05-24 ENCOUNTER — Emergency Department (HOSPITAL_BASED_OUTPATIENT_CLINIC_OR_DEPARTMENT_OTHER): Payer: Medicare Other

## 2015-05-24 DIAGNOSIS — I480 Paroxysmal atrial fibrillation: Secondary | ICD-10-CM | POA: Diagnosis present

## 2015-05-24 DIAGNOSIS — Z515 Encounter for palliative care: Secondary | ICD-10-CM | POA: Diagnosis not present

## 2015-05-24 DIAGNOSIS — Z8249 Family history of ischemic heart disease and other diseases of the circulatory system: Secondary | ICD-10-CM

## 2015-05-24 DIAGNOSIS — Z7901 Long term (current) use of anticoagulants: Secondary | ICD-10-CM

## 2015-05-24 DIAGNOSIS — R0902 Hypoxemia: Secondary | ICD-10-CM

## 2015-05-24 DIAGNOSIS — I130 Hypertensive heart and chronic kidney disease with heart failure and stage 1 through stage 4 chronic kidney disease, or unspecified chronic kidney disease: Secondary | ICD-10-CM | POA: Diagnosis present

## 2015-05-24 DIAGNOSIS — I482 Chronic atrial fibrillation: Secondary | ICD-10-CM | POA: Diagnosis present

## 2015-05-24 DIAGNOSIS — R570 Cardiogenic shock: Secondary | ICD-10-CM | POA: Diagnosis not present

## 2015-05-24 DIAGNOSIS — I481 Persistent atrial fibrillation: Secondary | ICD-10-CM | POA: Diagnosis present

## 2015-05-24 DIAGNOSIS — E039 Hypothyroidism, unspecified: Secondary | ICD-10-CM | POA: Diagnosis present

## 2015-05-24 DIAGNOSIS — I5022 Chronic systolic (congestive) heart failure: Secondary | ICD-10-CM | POA: Diagnosis present

## 2015-05-24 DIAGNOSIS — Z91048 Other nonmedicinal substance allergy status: Secondary | ICD-10-CM

## 2015-05-24 DIAGNOSIS — I422 Other hypertrophic cardiomyopathy: Secondary | ICD-10-CM | POA: Diagnosis present

## 2015-05-24 DIAGNOSIS — J189 Pneumonia, unspecified organism: Secondary | ICD-10-CM

## 2015-05-24 DIAGNOSIS — E038 Other specified hypothyroidism: Secondary | ICD-10-CM | POA: Diagnosis present

## 2015-05-24 DIAGNOSIS — E785 Hyperlipidemia, unspecified: Secondary | ICD-10-CM | POA: Diagnosis present

## 2015-05-24 DIAGNOSIS — J42 Unspecified chronic bronchitis: Secondary | ICD-10-CM | POA: Diagnosis present

## 2015-05-24 DIAGNOSIS — E871 Hypo-osmolality and hyponatremia: Secondary | ICD-10-CM | POA: Diagnosis not present

## 2015-05-24 DIAGNOSIS — F419 Anxiety disorder, unspecified: Secondary | ICD-10-CM | POA: Diagnosis present

## 2015-05-24 DIAGNOSIS — Z23 Encounter for immunization: Secondary | ICD-10-CM

## 2015-05-24 DIAGNOSIS — R109 Unspecified abdominal pain: Secondary | ICD-10-CM

## 2015-05-24 DIAGNOSIS — Z66 Do not resuscitate: Secondary | ICD-10-CM | POA: Diagnosis not present

## 2015-05-24 DIAGNOSIS — N183 Chronic kidney disease, stage 3 unspecified: Secondary | ICD-10-CM | POA: Diagnosis present

## 2015-05-24 DIAGNOSIS — R7989 Other specified abnormal findings of blood chemistry: Secondary | ICD-10-CM

## 2015-05-24 DIAGNOSIS — R0781 Pleurodynia: Secondary | ICD-10-CM

## 2015-05-24 DIAGNOSIS — I501 Left ventricular failure: Secondary | ICD-10-CM

## 2015-05-24 DIAGNOSIS — J690 Pneumonitis due to inhalation of food and vomit: Secondary | ICD-10-CM | POA: Diagnosis present

## 2015-05-24 DIAGNOSIS — N184 Chronic kidney disease, stage 4 (severe): Secondary | ICD-10-CM | POA: Diagnosis present

## 2015-05-24 DIAGNOSIS — Z888 Allergy status to other drugs, medicaments and biological substances status: Secondary | ICD-10-CM

## 2015-05-24 DIAGNOSIS — Z881 Allergy status to other antibiotic agents status: Secondary | ICD-10-CM

## 2015-05-24 DIAGNOSIS — Z95810 Presence of automatic (implantable) cardiac defibrillator: Secondary | ICD-10-CM

## 2015-05-24 DIAGNOSIS — R778 Other specified abnormalities of plasma proteins: Secondary | ICD-10-CM

## 2015-05-24 DIAGNOSIS — I5023 Acute on chronic systolic (congestive) heart failure: Secondary | ICD-10-CM | POA: Diagnosis not present

## 2015-05-24 DIAGNOSIS — I4819 Other persistent atrial fibrillation: Secondary | ICD-10-CM | POA: Diagnosis present

## 2015-05-24 DIAGNOSIS — J9601 Acute respiratory failure with hypoxia: Secondary | ICD-10-CM | POA: Diagnosis present

## 2015-05-24 DIAGNOSIS — Z87891 Personal history of nicotine dependence: Secondary | ICD-10-CM

## 2015-05-24 DIAGNOSIS — E875 Hyperkalemia: Secondary | ICD-10-CM | POA: Diagnosis not present

## 2015-05-24 DIAGNOSIS — Z7951 Long term (current) use of inhaled steroids: Secondary | ICD-10-CM

## 2015-05-24 DIAGNOSIS — M549 Dorsalgia, unspecified: Secondary | ICD-10-CM | POA: Diagnosis present

## 2015-05-24 DIAGNOSIS — I428 Other cardiomyopathies: Secondary | ICD-10-CM | POA: Diagnosis present

## 2015-05-24 NOTE — ED Notes (Addendum)
Patient was taken off her coumadin yesterday and is due to start eliquis. The patient reports that about  2  Hours ago she started to have a sharp pain to her left shoulder, ribs and flank pain. Patient is unable to breath deeply due to pain. Has been tired all day. The patient took tylenol when she noted a fever of 101 at home at about 2100

## 2015-05-24 NOTE — ED Provider Notes (Signed)
By signing my name below, I, Altamease Oiler, attest that this documentation has been prepared under the direction and in the presence of Hooker, DO. Electronically Signed: Altamease Oiler, ED Scribe. 05/26/2015. 11:40 PM.  TIME SEEN: 11:34 PM  CHIEF COMPLAINT: Back pain  HPI:  Sandra [name] is a 82 y.o. female with PMHx of a-fib currently transitioning from Coumadin to Eliquis, hypertrophic nonobstructive cardiomyopathy status post pacemaker/AICD on a dobutamine drip,  HLD, and HTN who presents to the Emergency Department complaining of sharp, 8/10 in severity, pain at the left side of the back with onset 2 hours PTA while sitting in a chair. The pain is exacerbated by deep breathing and nothing improves it. She had similar pain in the past with aspiration pneumonia. Associated symptoms include fatigue since yesterday, fever up to 101, and cough productive of a small amount of blood. The pt stopped taking her coumadin yesterday in preparation to start eliquis today. She is on a 5 mg Dobutamine drip. Pt denies feeling her ICD fire.  Pt denies chest pain, SOB, vomiting, diarrhea, dysuria, hematuria. No history of kidney stones.     ROS: See HPI Constitutional: no fever  Eyes: no drainage  ENT: no runny nose   Cardiovascular:  no chest pain  Resp: no SOB  GI: no vomiting GU: no dysuria Integumentary: no rash  Allergy: no hives  Musculoskeletal: no leg swelling  Neurological: no slurred speech ROS otherwise negative  PAST MEDICAL HISTORY/PAST SURGICAL HISTORY:  Past Medical History  Diagnosis Date  . Atrial fibrillation ([place]) permanent     on Tikosyn and Coumadin  . Hyperlipidemia   . Hypothyroidism   . Bradycardia   . Cardiomyopathy, hypertrophic nonobstructive (HCC)     ejection fraction 25%  . Anal fissure   . Adenomatous colon polyp   . Diverticulosis   . ICD-CRT     generator change 2013  . Systolic heart failure   . Hyperlipidemia   . Hypertension   . RBBB  plus LA hemiblock   . Family history of adverse reaction to anesthesia     "middle daughter couldn't take Versed"  . CHF (congestive heart failure) ([place])   . Varicose veins   . Chronic bronchitis ([place])     "get it most q spring and fall" (02/06/2015)  . Migraines     "stopped after I went thru the change"  . History of gout   . Situational anxiety   . Chronic renal insufficiency, stage III (moderate)     gd 3-4  . Basal cell carcinoma     "right foot; right leg" (02/06/2015)  . Aspiration pneumonia ([place]) 03/08/2015    Archie Endo 03/08/2015    MEDICATIONS:  Prior to Admission medications   Medication Sig Start Date End Date Taking? Authorizing Provider  acetaminophen (TYLENOL) 325 MG tablet Take 650 mg by mouth every 6 (six) hours as needed for mild pain or headache. Reported on 03/07/2015    Historical Provider, MD  apixaban (ELIQUIS) 2.5 MG TABS tablet Take 1 tablet (2.5 mg total) by mouth 2 (two) times daily. 05/23/15   Shaune Pascal Bensimhon, MD  DOBUTamine (DOBUTREX) 4-5 MG/ML-% infusion Inject 264 mcg/min into the vein continuous. 05/23/15   Jolaine Artist, MD  ezetimibe (ZETIA) 10 MG tablet Take 10 mg by mouth every Monday, Wednesday, and Friday.     Historical Provider, MD  fluticasone (FLONASE) 50 MCG/ACT nasal spray Place 2 sprays into both nostrils daily. 09/27/14   Brayton Mars  Hunter, MD  levothyroxine (SYNTHROID, LEVOTHROID) 100 MCG tablet Take 100 mcg by mouth at bedtime.    Historical Provider, MD  potassium chloride (K-DUR,KLOR-CON) 10 MEQ tablet Take 20 mEq by mouth 2 (two) times daily.    Historical Provider, MD  PROAIR HFA 108 [phone] Base) MCG/ACT inhaler INHALE TWO PUFFS INTO LUNGS EVERY 6 HOURS AS NEEDED FOR WHEEZING OR  SHORTNESS OF BREATH 04/16/15   Marin Olp, MD  simethicone (MYLICON) 80 MG chewable tablet Chew 1 tablet (80 mg total) by mouth every 6 (six) hours as needed for flatulence. 03/14/15   Reyne Dumas, MD  torsemide (DEMADEX) 20 MG tablet Take 2 tablets (40 mg total)  by mouth 2 (two) times daily. May take an extra 20mg  as needed for fluid/edema. 05/23/15   Jolaine Artist, MD  triamcinolone cream (KENALOG) 0.1 % Apply 1 application topically 2 (two) times daily as needed (itching/rash).    Historical Provider, MD  VITAMIN E PO Take 1 tablet by mouth daily.    Historical Provider, MD    ALLERGIES:  Allergies  Allergen Reactions  . Levofloxacin Other (See Comments)    Due to cardiac  AF  . Procaine Hcl Other (See Comments)    REACTION: smothers  . Spironolactone Diarrhea    Abdominal pain  . Amiodarone Nausea And Vomiting  . Tape Other (See Comments)    Tears skin off.  Please use "paper" tape only  . Amoxicillin Other (See Comments)    Rash   . Codeine Other (See Comments)    REACTION: nausea  . Niacin Other (See Comments)    REACTION: rash  . Ramipril Other (See Comments)    unknown  . Statins Other (See Comments)    REACTION: muscle aches    SOCIAL HISTORY:  Social History  Substance Use Topics  . Smoking status: Former Smoker -- 0.20 packs/day for 19 years    Types: Cigarettes    Quit date: 01/13/1987  . Smokeless tobacco: Never Used  . Alcohol Use: No    FAMILY HISTORY: Family History  Problem Relation Age of Onset  . Heart disease Mother   . Colon cancer Father   . COPD Father   . Heart disease Father   . Cardiomyopathy Daughter   . Breast cancer Paternal Aunt   . Colon polyps Father   . Diabetes Neg Hx   . Kidney disease Neg Hx     EXAM: BP 133/72 mmHg  Pulse 73  Resp 18  Ht 5\' 3"  (1.6 m)  Wt 133 lb (60.328 kg)  BMI 23.57 kg/m2  SpO2 96% CONSTITUTIONAL: Alert and oriented and responds appropriately to questions. Elderly, chronically ill-appearing, appears uncomfortable, afebrile, and nontoxic HEAD: Normocephalic EYES: Conjunctivae clear, PERRL ENT: normal nose; no rhinorrhea; moist mucous membranes NECK: Supple, no meningismus, no LAD  CARD: RRR; S1 and S2 appreciated; no murmurs, no clicks, no rubs, no  gallops RESP: Normal chest excursion without splinting or tachypnea; Breath sounds equal bilaterally; no wheezes, no rhonchi, crackles throughout right lower and right mid lung, no hypoxia or respiratory distress, speaking full sentences ABD/GI: Normal bowel sounds; non-distended; soft, non-tender, no rebound, no guarding, no peritoneal signs BACK:  The back appears normal and is non-tender to palpation, there is no CVA tenderness, no midline spinal tenderness, step off, or deformity EXT: Normal ROM in all joints; non-tender to palpation; no edema; normal capillary refill; no cyanosis, no calf tenderness or swelling    SKIN: Normal color for age and  race; warm; no rash NEURO: Moves all extremities equally, sensation to light touch intact diffusely, cranial nerves II through XII intact PSYCH: The patient's mood and manner are appropriate. Grooming and personal hygiene are appropriate.  MEDICAL DECISION MAKING: Patient here with symptoms concerning for pneumonia. Reports fever of 101 at home. Took Tylenol prior to arrival. Afebrile and hemodynamically stable now but does appear uncomfortable. Unfortunately we are unable to interrogate a St. Jude's pacemaker here. Family reports pacemaker was interrogated by her cardiologist yesterday and they were told there was no abnormality. She is also risk for pulmonary embolus but does have history of chronic kidney disease and we are on able to obtain a CT scan with contrast.  She guarding is on anticoagulation making this less likely. We'll obtain labs, chest x-ray, lactate, blood cultures. We'll hold on any IV fluids given she is normotensive and has a history of severe cardiomyopathy on a dobutamine drip. Will give fentanyl for pain.  ED PROGRESS: Patient's labs show stable chronic kidney disease. No leukocytosis. She does have a chronic troponin leak. Her INR is still therapeutic at 2.38. Her BNP is 1400 and appears to be baseline. Chest x-ray shows a right lower  lobe opacity. It is smaller than the opacity she had in February when she was diagnosed with pneumonia but she had no subsequent imaging to ensure resolution. I suspect that this is a new pneumonia. She had fever of 101 at home and has had productive cough. We'll give ceftriaxone, azithromycin. I feel she will need admission.  PCP is Dr. Yong Channel with [facility].  1:45 AM  Pt's oxygen saturation dropped to 86% she became more tachypneic. Place on 2 L nasal cannula and is improving. Does not wear oxygen chronically.  Discussed patient's case with hospitalist, Dr. Loleta Books.  Recommend admission to inpatient, telemetry bed.  I will place holding orders per their request. Patient and family (if present) updated with plan. Care transferred to hospitalist service.  Pt is stable  I reviewed all nursing notes, vitals, pertinent old records, EKGs, labs, imaging (as available).   Date: 05/21/2015 23:27  Rate: 73  Rhythm: Atrial fibrillation, ventricular paced rhythm  QRS Axis: normal  Intervals: normal  ST/T Wave abnormalities: normal  Conduction Disutrbances: Ventricular paced rhythm  Narrative Interpretation: Atrial fib, ventricular paced rhythm         [place], DO 05/25/15 0145

## 2015-05-25 DIAGNOSIS — N183 Chronic kidney disease, stage 3 unspecified: Secondary | ICD-10-CM | POA: Diagnosis present

## 2015-05-25 DIAGNOSIS — E038 Other specified hypothyroidism: Secondary | ICD-10-CM

## 2015-05-25 DIAGNOSIS — M549 Dorsalgia, unspecified: Secondary | ICD-10-CM | POA: Diagnosis present

## 2015-05-25 DIAGNOSIS — J189 Pneumonia, unspecified organism: Secondary | ICD-10-CM | POA: Diagnosis not present

## 2015-05-25 DIAGNOSIS — I5022 Chronic systolic (congestive) heart failure: Secondary | ICD-10-CM | POA: Diagnosis not present

## 2015-05-25 DIAGNOSIS — I481 Persistent atrial fibrillation: Secondary | ICD-10-CM

## 2015-05-25 DIAGNOSIS — F419 Anxiety disorder, unspecified: Secondary | ICD-10-CM | POA: Diagnosis not present

## 2015-05-25 DIAGNOSIS — J9601 Acute respiratory failure with hypoxia: Secondary | ICD-10-CM | POA: Diagnosis not present

## 2015-05-25 DIAGNOSIS — I428 Other cardiomyopathies: Secondary | ICD-10-CM | POA: Diagnosis present

## 2015-05-25 DIAGNOSIS — Z95810 Presence of automatic (implantable) cardiac defibrillator: Secondary | ICD-10-CM

## 2015-05-25 DIAGNOSIS — M5442 Lumbago with sciatica, left side: Secondary | ICD-10-CM

## 2015-05-25 LAB — EXPECTORATED SPUTUM ASSESSMENT W REFEX TO RESP CULTURE

## 2015-05-25 LAB — URINALYSIS, ROUTINE W REFLEX MICROSCOPIC
BILIRUBIN URINE: NEGATIVE
GLUCOSE, UA: NEGATIVE mg/dL
Hgb urine dipstick: NEGATIVE
KETONES UR: NEGATIVE mg/dL
LEUKOCYTES UA: NEGATIVE
NITRITE: NEGATIVE
PROTEIN: NEGATIVE mg/dL
Specific Gravity, Urine: 1.012 (ref 1.005–1.030)
pH: 6 (ref 5.0–8.0)

## 2015-05-25 LAB — I-STAT CG4 LACTIC ACID, ED: LACTIC ACID, VENOUS: 1.41 mmol/L (ref 0.5–2.0)

## 2015-05-25 LAB — BASIC METABOLIC PANEL
ANION GAP: 11 (ref 5–15)
BUN: 39 mg/dL — ABNORMAL HIGH (ref 6–20)
CALCIUM: 9.2 mg/dL (ref 8.9–10.3)
CO2: 23 mmol/L (ref 22–32)
Chloride: 102 mmol/L (ref 101–111)
Creatinine, Ser: 1.94 mg/dL — ABNORMAL HIGH (ref 0.44–1.00)
GFR calc non Af Amer: 23 mL/min — ABNORMAL LOW (ref >60)
GFR, EST AFRICAN AMERICAN: 27 mL/min — AB (ref >60)
GLUCOSE: 112 mg/dL — AB (ref 65–99)
POTASSIUM: 4.5 mmol/L (ref 3.5–5.1)
Sodium: 136 mmol/L (ref 135–145)

## 2015-05-25 LAB — CBC
HEMATOCRIT: 35.9 % — AB (ref 36.0–46.0)
Hemoglobin: 10.9 g/dL — ABNORMAL LOW (ref 12.0–15.0)
MCH: 24.5 pg — ABNORMAL LOW (ref 26.0–34.0)
MCHC: 30.4 g/dL (ref 30.0–36.0)
MCV: 80.9 fL (ref 78.0–100.0)
PLATELETS: 199 10*3/uL (ref 150–400)
RBC: 4.44 MIL/uL (ref 3.87–5.11)
RDW: 21 % — ABNORMAL HIGH (ref 11.5–15.5)
WBC: 10.3 10*3/uL (ref 4.0–10.5)

## 2015-05-25 LAB — PROTIME-INR
INR: 2.38 — AB (ref 0.00–1.49)
PROTHROMBIN TIME: 25.7 s — AB (ref 11.6–15.2)

## 2015-05-25 LAB — TROPONIN I
Troponin I: 0.14 ng/mL — ABNORMAL HIGH (ref <0.031)
Troponin I: 0.17 ng/mL — ABNORMAL HIGH (ref <0.031)

## 2015-05-25 LAB — STREP PNEUMONIAE URINARY ANTIGEN: Strep Pneumo Urinary Antigen: NEGATIVE

## 2015-05-25 LAB — EXPECTORATED SPUTUM ASSESSMENT W GRAM STAIN, RFLX TO RESP C

## 2015-05-25 LAB — BRAIN NATRIURETIC PEPTIDE: B NATRIURETIC PEPTIDE 5: 1407.1 pg/mL — AB (ref 0.0–100.0)

## 2015-05-25 MED ORDER — SIMETHICONE 80 MG PO CHEW: 80.0000 mg | CHEWABLE_TABLET | Freq: Four times a day (QID) | ORAL | Status: DC | PRN

## 2015-05-25 MED ORDER — LEVOTHYROXINE SODIUM 100 MCG PO TABS
100.0000 ug | ORAL_TABLET | Freq: Every day | ORAL | Status: DC
  Administered 2015-05-25 – 2015-06-02 (×8): 100 ug via ORAL
  Filled 2015-05-25 (×8): qty 1

## 2015-05-25 MED ORDER — FENTANYL CITRATE (PF) 100 MCG/2ML IJ SOLN
50.0000 ug | Freq: Once | INTRAMUSCULAR | Status: AC
  Administered 2015-05-25: 50 ug via INTRAVENOUS
  Filled 2015-05-25: qty 2

## 2015-05-25 MED ORDER — CEFTRIAXONE SODIUM 2 G IJ SOLR
2.0000 g | INTRAMUSCULAR | Status: DC
  Administered 2015-05-26 – 2015-05-28 (×3): 2 g via INTRAVENOUS
  Filled 2015-05-25 (×3): qty 2

## 2015-05-25 MED ORDER — TRAMADOL HCL 50 MG PO TABS
50.0000 mg | ORAL_TABLET | Freq: Once | ORAL | Status: AC
  Administered 2015-05-25: 25 mg via ORAL
  Filled 2015-05-25: qty 1

## 2015-05-25 MED ORDER — APIXABAN 2.5 MG PO TABS
2.5000 mg | ORAL_TABLET | Freq: Two times a day (BID) | ORAL | Status: DC
  Administered 2015-05-25 – 2015-05-28 (×7): 2.5 mg via ORAL
  Filled 2015-05-25 (×7): qty 1

## 2015-05-25 MED ORDER — SODIUM CHLORIDE 0.9% FLUSH
3.0000 mL | Freq: Two times a day (BID) | INTRAVENOUS | Status: DC
  Administered 2015-05-25 – 2015-06-01 (×6): 3 mL via INTRAVENOUS

## 2015-05-25 MED ORDER — SODIUM CHLORIDE 0.9% FLUSH
10.0000 mL | INTRAVENOUS | Status: DC | PRN
Start: 2015-05-25 — End: 2015-06-04
  Administered 2015-05-25 – 2015-05-28 (×2): 10 mL
  Administered 2015-06-01: 30 mL
  Filled 2015-05-25 (×3): qty 40

## 2015-05-25 MED ORDER — TORSEMIDE 20 MG PO TABS: 40.0000 mg | ORAL_TABLET | Freq: Two times a day (BID) | ORAL | Status: DC

## 2015-05-25 MED ORDER — ACETAMINOPHEN 325 MG PO TABS
650.0000 mg | ORAL_TABLET | Freq: Four times a day (QID) | ORAL | Status: DC | PRN
  Administered 2015-05-25 – 2015-05-27 (×5): 650 mg via ORAL
  Filled 2015-05-25 (×5): qty 2

## 2015-05-25 MED ORDER — SENNOSIDES-DOCUSATE SODIUM 8.6-50 MG PO TABS
1.0000 | ORAL_TABLET | Freq: Every evening | ORAL | Status: DC | PRN
  Administered 2015-05-26: 1 via ORAL
  Filled 2015-05-25: qty 1

## 2015-05-25 MED ORDER — SODIUM CHLORIDE 0.9% FLUSH: 3.0000 mL | INTRAVENOUS | Status: DC | PRN

## 2015-05-25 MED ORDER — OXYCODONE-ACETAMINOPHEN 5-325 MG PO TABS
2.0000 | ORAL_TABLET | Freq: Once | ORAL | Status: AC
  Administered 2015-05-25: 2 via ORAL
  Filled 2015-05-25: qty 2

## 2015-05-25 MED ORDER — ALBUTEROL SULFATE HFA 108 (90 BASE) MCG/ACT IN AERS: 2.0000 | INHALATION_SPRAY | Freq: Four times a day (QID) | RESPIRATORY_TRACT | Status: DC | PRN

## 2015-05-25 MED ORDER — SODIUM CHLORIDE 0.9 % IV SOLN
250.0000 mL | INTRAVENOUS | Status: DC | PRN
  Administered 2015-05-28: 250 mL via INTRAVENOUS

## 2015-05-25 MED ORDER — DEXTROSE 5 % IV SOLN
1.0000 g | Freq: Once | INTRAVENOUS | Status: AC
  Administered 2015-05-25: 1 g via INTRAVENOUS
  Filled 2015-05-25: qty 10

## 2015-05-25 MED ORDER — EZETIMIBE 10 MG PO TABS
10.0000 mg | ORAL_TABLET | ORAL | Status: DC
  Administered 2015-05-27 – 2015-05-31 (×3): 10 mg via ORAL
  Filled 2015-05-25 (×4): qty 1

## 2015-05-25 MED ORDER — DEXTROSE 5 % IV SOLN
250.0000 mg | INTRAVENOUS | Status: DC
  Administered 2015-05-25 – 2015-05-28 (×4): 250 mg via INTRAVENOUS
  Filled 2015-05-25 (×4): qty 250

## 2015-05-25 MED ORDER — DOBUTAMINE IN D5W 4-5 MG/ML-% IV SOLN
5.0000 ug/kg/min | INTRAVENOUS | Status: DC
  Administered 2015-05-25 – 2015-05-28 (×2): 4 ug/kg/min via INTRAVENOUS
  Administered 2015-05-29 – 2015-05-31 (×3): 7.5 ug/kg/min via INTRAVENOUS
  Filled 2015-05-25 (×7): qty 250

## 2015-05-25 MED ORDER — ONDANSETRON HCL 4 MG/2ML IJ SOLN
4.0000 mg | Freq: Once | INTRAMUSCULAR | Status: AC
  Administered 2015-05-25: 4 mg via INTRAVENOUS
  Filled 2015-05-25: qty 2

## 2015-05-25 MED ORDER — TRIAMCINOLONE ACETONIDE 0.1 % EX CREA
1.0000 "application " | TOPICAL_CREAM | Freq: Two times a day (BID) | CUTANEOUS | Status: DC | PRN
  Administered 2015-05-26 – 2015-06-02 (×2): 1 via TOPICAL
  Filled 2015-05-25 (×2): qty 15

## 2015-05-25 MED ORDER — ALBUTEROL SULFATE (2.5 MG/3ML) 0.083% IN NEBU
2.5000 mg | INHALATION_SOLUTION | RESPIRATORY_TRACT | Status: DC | PRN
  Administered 2015-05-30 – 2015-06-02 (×2): 2.5 mg via RESPIRATORY_TRACT
  Filled 2015-05-25 (×2): qty 3

## 2015-05-25 MED ORDER — TORSEMIDE 20 MG PO TABS
40.0000 mg | ORAL_TABLET | Freq: Two times a day (BID) | ORAL | Status: DC
  Administered 2015-05-26: 40 mg via ORAL
  Filled 2015-05-25: qty 2

## 2015-05-25 MED ORDER — FUROSEMIDE 10 MG/ML IJ SOLN
80.0000 mg | Freq: Once | INTRAMUSCULAR | Status: AC
  Administered 2015-05-25: 80 mg via INTRAVENOUS
  Filled 2015-05-25: qty 8

## 2015-05-25 MED ORDER — AZITHROMYCIN 500 MG IV SOLR
INTRAVENOUS | Status: AC
  Filled 2015-05-25: qty 500

## 2015-05-25 MED ORDER — DEXTROSE 5 % IV SOLN
500.0000 mg | Freq: Once | INTRAVENOUS | Status: AC
  Administered 2015-05-25: 500 mg via INTRAVENOUS

## 2015-05-25 MED ORDER — SODIUM CHLORIDE 0.9% FLUSH
3.0000 mL | Freq: Two times a day (BID) | INTRAVENOUS | Status: DC
  Administered 2015-05-26 – 2015-06-01 (×8): 3 mL via INTRAVENOUS

## 2015-05-25 MED ORDER — FLUTICASONE PROPIONATE 50 MCG/ACT NA SUSP
2.0000 | Freq: Every day | NASAL | Status: DC
  Administered 2015-05-25 – 2015-06-01 (×7): 2 via NASAL
  Filled 2015-05-25 (×2): qty 16

## 2015-05-25 MED ORDER — ONDANSETRON HCL 4 MG/2ML IJ SOLN
4.0000 mg | Freq: Once | INTRAMUSCULAR | Status: DC
  Filled 2015-05-25 (×2): qty 2

## 2015-05-25 MED ORDER — POTASSIUM CHLORIDE CRYS ER 10 MEQ PO TBCR
20.0000 meq | EXTENDED_RELEASE_TABLET | Freq: Two times a day (BID) | ORAL | Status: DC
  Administered 2015-05-25 – 2015-05-26 (×2): 20 meq via ORAL
  Filled 2015-05-25: qty 1
  Filled 2015-05-25 (×2): qty 2

## 2015-05-25 NOTE — Consult Note (Signed)
CARDIOLOGY CONSULT NOTE  Patient ID: Sandra [name], MRN: [id number], DOB/AGE: [birth year] 34 y.o. Admit date: 05/16/2015 Date of Consult: 05/25/2015  Primary Physician: Garret Reddish, MD Primary Cardiologist: SK/CH[facility] Consulting Physician triad  Chief Complaint: SOB   HPI Sandra [name] is a 82 y.o. female admitted 06/02/2015 with shortness of breath. She developed sudden left-sided chest pain while at rest. It was pleuritic. Sputum was noted as well as fever. CT deferred because of renal insufficiency. The issue of pulmonary embolism had been raised; notably she has just been transitioned to [place]  from Coumadin  She denies fluid overload.  Belly has not been swollen. She has been more fatigued which is her manifestation of volume.  She was seen in the heart failure clinic the day before and thought to be doing well. Her weight was up a little bit. Her dobutamine was done titrated to 4. Titration of 20 mg of torsemide was recommended.  2She has a history of hypertrophic cardiomyopathy with depressed LV function (EF 25 per) and prior CRT-D implantation. She has been managed with home dobutamine (5 mics per kilo per minute) because of persistent problems with heart failure. She has had persistent atrial fibrillation managed with dofetilide. Ultimately she underwent AV junction ablation 1/17.  She was admitted 2/17 with aspiration pneumonia; heart failure initially stable but then felt to decompensate from volume overload. Discharge weight was 140 pounds.   Past Medical History  Diagnosis Date  . Atrial fibrillation ([place]) permanent     on Tikosyn and Coumadin  . Hyperlipidemia   . Hypothyroidism   . Bradycardia   . Cardiomyopathy, hypertrophic nonobstructive (HCC)     ejection fraction 25%  . Anal fissure   . Adenomatous colon polyp   . Diverticulosis   . ICD-CRT     generator change 2013  . Systolic heart failure   . Hyperlipidemia   . Hypertension   . RBBB plus LA  hemiblock   . Family history of adverse reaction to anesthesia     "middle daughter couldn't take Versed"  . CHF (congestive heart failure) ([employer])   . Varicose veins   . Chronic bronchitis ([place])     "get it most q spring and fall" (02/06/2015)  . Migraines     "stopped after I went thru the change"  . History of gout   . Situational anxiety   . Chronic renal insufficiency, stage III (moderate)     gd 3-4  . Basal cell carcinoma     "right foot; right leg" (02/06/2015)  . Aspiration pneumonia ([place]) 03/08/2015    Archie Endo 03/08/2015      Surgical History:  Past Surgical History  Procedure Laterality Date  . Cardioversion    . Cardiac defibrillator placement  2007  . Vaginal hysterectomy      uterus alone  . Ovary surgery  1989    "tumor on one of my ovaries; benign  . Biv icd genertaor change out N/A 03/23/2011    Procedure: BIV ICD GENERTAOR CHANGE OUT;  Surgeon: Deboraha Sprang, MD;  Location: [facility] CATH LAB;  Service: Cardiovascular;  Laterality: N/A;  . Colonoscopy    . Cardiac catheterization N/A 10/29/2014    Procedure: Right Heart Cath;  Surgeon: Jolaine Artist, MD;  Location: [facility] CV LAB;  Service: Cardiovascular;  Laterality: N/A;  . Cardioversion N/A 11/09/2014    Procedure: CARDIOVERSION;  Surgeon: Jolaine Artist, MD;  Location: [facility];  Service: Cardiovascular;  Laterality: N/A;  . Av node ablation  02/06/2015  . Cataract extraction w/ intraocular lens  implant, bilateral Bilateral 2000s  . Colonoscopy    . Picc line place peripheral ([facility] hx) Right 10/2014    arm  . Electrophysiologic study N/A 02/06/2015    Procedure: AV Node Ablation;  Surgeon: Deboraha Sprang, MD;  Location: [facility] CV LAB;  Service: Cardiovascular;  Laterality: N/A;     Home Meds: Prior to Admission medications   Medication Sig Start Date End Date Taking? Authorizing Provider  acetaminophen (TYLENOL) 325 MG tablet Take 650 mg by mouth every 6 (six) hours as needed for mild pain  or headache. Reported on 03/07/2015   Yes Historical Provider, MD  apixaban (ELIQUIS) 2.5 MG TABS tablet Take 1 tablet (2.5 mg total) by mouth 2 (two) times daily. 05/23/15  Yes Shaune Pascal Bensimhon, MD  DOBUTamine (DOBUTREX) 4-5 MG/ML-% infusion Inject 264 mcg/min into the vein continuous. 05/23/15  Yes Jolaine Artist, MD  ezetimibe (ZETIA) 10 MG tablet Take 10 mg by mouth every Monday, Wednesday, and Friday.    Yes Historical Provider, MD  febuxostat (ULORIC) 40 MG tablet Take 40 mg by mouth daily.   Yes Historical Provider, MD  fluticasone (FLONASE) 50 MCG/ACT nasal spray Place 2 sprays into both nostrils daily. 09/27/14  Yes Marin Olp, MD  levothyroxine (SYNTHROID, LEVOTHROID) 100 MCG tablet Take 100 mcg by mouth at bedtime.   Yes Historical Provider, MD  potassium chloride (K-DUR,KLOR-CON) 10 MEQ tablet Take 20 mEq by mouth 2 (two) times daily.   Yes Historical Provider, MD  PROAIR HFA 108 (90 Base) MCG/ACT inhaler INHALE TWO PUFFS INTO LUNGS EVERY 6 HOURS AS NEEDED FOR WHEEZING OR  SHORTNESS OF BREATH 04/16/15  Yes Marin Olp, MD  simethicone (MYLICON) 80 MG chewable tablet Chew 1 tablet (80 mg total) by mouth every 6 (six) hours as needed for flatulence. 03/14/15  Yes Reyne Dumas, MD  torsemide (DEMADEX) 20 MG tablet Take 2 tablets (40 mg total) by mouth 2 (two) times daily. May take an extra 20mg  as needed for fluid/edema. 05/23/15  Yes Jolaine Artist, MD  triamcinolone cream (KENALOG) 0.1 % Apply 1 application topically 2 (two) times daily as needed (itching/rash).   Yes Historical Provider, MD  VITAMIN E PO Take 1 tablet by mouth daily.   Yes Historical Provider, MD    Inpatient Medications:  . apixaban  2.5 mg Oral BID  . azithromycin  250 mg Intravenous Q24H  . [START ON 05/26/2015] cefTRIAXone (ROCEPHIN)  IV  2 g Intravenous Q24H  . [START ON 05/27/2015] ezetimibe  10 mg Oral Q M,W,F  . fluticasone  2 spray Each Nare Daily  . levothyroxine  100 mcg Oral QHS  . potassium  chloride  20 mEq Oral BID  . sodium chloride flush  3 mL Intravenous Q12H  . sodium chloride flush  3 mL Intravenous Q12H  . torsemide  40 mg Oral BID    Allergies:  Allergies  Allergen Reactions  . Levofloxacin Other (See Comments)    Due to cardiac  AF  . Procaine Hcl Other (See Comments)    REACTION: smothers  . Spironolactone Diarrhea    Abdominal pain  . Amiodarone Nausea And Vomiting  . Tape Other (See Comments)    Tears skin off.  Please use "paper" tape only  . Amoxicillin Other (See Comments)    Rash   . Codeine Other (See Comments)    REACTION: nausea  .  Niacin Other (See Comments)    REACTION: rash  . Ramipril Other (See Comments)    unknown  . Statins Other (See Comments)    REACTION: muscle aches    Social History   Social History  . Marital Status: Married    Spouse Name: N/A  . Number of Children: 3  . Years of Education: N/A   Occupational History  . Retired     Probation officer   Social History Main Topics  . Smoking status: Former Smoker -- 0.20 packs/day for 19 years    Types: Cigarettes    Quit date: 01/13/1987  . Smokeless tobacco: Never Used  . Alcohol Use: No  . Drug Use: No  . Sexual Activity: No   Other Topics Concern  . Not on file   Social History Narrative   Married 1953 (husband Clare Gandy in our [employer]). 3 girls (1 is a patient here-[name]). 2 grandkids.       Retired in [place]: very active, going to church and church friends, movies     Family History  Problem Relation Age of Onset  . Heart disease Mother   . Colon cancer Father   . COPD Father   . Heart disease Father   . Cardiomyopathy Daughter   . Breast cancer Paternal Aunt   . Colon polyps Father   . Diabetes Neg Hx   . Kidney disease Neg Hx      ROS:  Please see the history of present illness.     All other systems reviewed and negative.    Physical Exam:   Blood pressure 124/69, pulse 70, temperature 98 F (36.7 C), temperature  source Axillary, resp. rate 21, height 5\' 3"  (1.6 m), weight 138 lb 14.2 oz (63 kg), SpO2 100 %. General: Well developed, well nourished female in no acute distress. Head: Normocephalic, atraumatic, sclera non-icteric, no xanthomas, nares are without discharge. EENT: normal  Lymph Nodes:  none Neck: Negative for carotid bruits. JVD >10 Back:without scoliosis kyphosis Lungs: Clear bilaterally to auscultation without wheezes, rales, or rhonchi. Breathing is unlabored. Heart: RRR with S1 S2.  2 /6 systolic murmur . No rubs, or gallops appreciated. Abdomen: Soft, non-tender, mildly-distended with normoactive bowel sounds. No hepatomegaly. No rebound/guarding. No obvious abdominal masses. Msk:  Strength and tone appear normal for age. Extremities: No clubbing or cyanosis. No edema.  Distal pedal pulses are 2+ and equal bilaterally. Skin: Warm and Dry Neuro: Alert and oriented X 3. CN III-XII intact Grossly normal sensory and motor function . Psych:  Responds to questions appropriately with a normal affect.      Labs: Cardiac Enzymes  Recent Labs  06/12/2015 2358 05/25/15 0944  TROPONINI 0.14* 0.17*   CBC Lab Results  Component Value Date   WBC 10.3 06/08/2015   HGB 10.9* 05/18/2015   HCT 35.9* 06/03/2015   MCV 80.9 05/14/2015   PLT 199 06/07/2015   PROTIME:  Recent Labs  06/02/2015 2358  LABPROT 25.7*  INR 2.38*   Chemistry  Recent Labs Lab 05/26/2015 2358  NA 136  K 4.5  CL 102  CO2 23  BUN 39*  CREATININE 1.94*  CALCIUM 9.2  GLUCOSE 112*   Lipids Lab Results  Component Value Date   CHOL 231* 08/04/2011   HDL 42.30 08/04/2011   LDLCALC 124* 06/24/2009   TRIG 220.0* 08/04/2011   BNP PRO B NATRIURETIC PEPTIDE (BNP)  Date/Time Value Ref Range Status  12/18/2012 03:07 AM  6603.0* 0 - 450 pg/mL Final  01/29/2012 09:48 AM 1012.0* 0.0 - 100.0 pg/mL Final  11/23/2011 10:47 AM 1159.0* 0.0 - 100.0 pg/mL Final  10/22/2011 08:35 AM 1114.0* 0.0 - 100.0 pg/mL Final    Thyroid Function Tests: No results for input(s): TSH, T4TOTAL, T3FREE, THYROIDAB in the last 72 hours.  Invalid input(s): FREET3 Miscellaneous No results found for: DDIMER  Radiology/Studies:  Dg Chest 2 View  05/25/2015  CLINICAL DATA:  82 year old female with chest pain. EXAM: CHEST  2 VIEW COMPARISON:  Chest radiograph dated 03/12/2015 and CT dated 03/09/2015 FINDINGS: There is a focal wedge-shaped opacity in the superior segment of the right lower lobe which appears smaller compared to the prior radiograph. Left lung base atelectatic changes/scarring noted. A small right pleural effusion may be present. There is no pneumothorax. There is moderate cardiomegaly. Left pectoral AICD device. The right sided PICC with tip at the cavoatrial junction. No acute fracture. IMPRESSION: Interval decrease in the size of the right lower lobe opacity. Moderate cardiomegaly. Electronically Signed   By: Anner Crete M.D.   On: 05/25/2015 01:27    EKG: V paced withafib   Assessment and Plan:  Shortness of breath with pleuritic pain  Recent transition from Coumadin--apixoban  Atrial fibrillation-permanent  Hypertrophic cardiomyopathy-dilated-LV systolic dysfunction EF 123456  Aspiration pneumonia-from 04/20/2015    There is mild volume overload but not all that different from the examination from a couple of days ago. I will give her one dose of IV torsemide  we will not change her dobutamine dose  The abrupt onset of the pain is pleuritic component would suggest that this is not heart failure. Pulmonary embolism is a unlikely possibility in the transition from Coumadin to [place] as her INR was 2.47 yesterday.  The issue of pneumonia is raised by the prior history; not quite sure why she would have recurrent pneumonia. She is on antibiotics       Virl Axe

## 2015-05-25 NOTE — Progress Notes (Signed)
D/C'd Dobutamine from home health pump. Staff RN connected Dobutamine to [facility] pump. Daughter will take home health pump home with her.

## 2015-05-25 NOTE — ED Notes (Signed)
Patient up to the [facility]  - 10/ 10 pain and back to bed.

## 2015-05-25 NOTE — Progress Notes (Addendum)
Pharmacy Antibiotic Note  Sandra [name] is a 82 y.o. female admitted on 06/01/2015 with pneumonia.  Pharmacy has been consulted for ceftriaxone dosing.  WBC wnl, lactate 1.41, afebrile. CXR with lobar opacity. Received CTX & azithromycin in ED, will start next dose tomorrow.   Plan: Ceftriaxone 2g IV qday F/u cx, clinical s/sx improvement, duration of therapy Pharmacy will follow peripherally  Height: 5\' 3"  (160 cm) Weight: 138 lb 14.2 oz (63 kg) IBW/kg (Calculated) : 52.4  Temp (24hrs), Avg:98.1 F (36.7 C), Min:98 F (36.7 C), Max:98.1 F (36.7 C)   Recent Labs Lab 05/20/15 1045 05/23/2015 2358 05/25/15 0015  WBC 4.8 10.3  --   CREATININE 1.84* 1.94*  --   LATICACIDVEN  --   --  1.41    Estimated Creatinine Clearance: 20.3 mL/min (by C-G formula based on Cr of 1.94).    Allergies  Allergen Reactions  . Levofloxacin Other (See Comments)    Due to cardiac  AF  . Procaine Hcl Other (See Comments)    REACTION: smothers  . Spironolactone Diarrhea    Abdominal pain  . Amiodarone Nausea And Vomiting  . Tape Other (See Comments)    Tears skin off.  Please use "paper" tape only  . Amoxicillin Other (See Comments)    Rash   . Codeine Other (See Comments)    REACTION: nausea  . Niacin Other (See Comments)    REACTION: rash  . Ramipril Other (See Comments)    unknown  . Statins Other (See Comments)    REACTION: muscle aches    Antimicrobials this admission: 5/13 azithro >>  5/13 CTX >>   Dose adjustments this admission: N/A  Microbiology results: 5/12 BCx:  5/12 Sputum:     Thank you for allowing pharmacy to be a part of this patient's care.  Wynelle Fanny 05/25/2015 8:31 AM

## 2015-05-25 NOTE — H&P (Signed)
[facility] History and Physical  Sandra [name] [id number] DOB: 09-12-1932 DOA: 06/01/2015  Referring physician:  PCP: Garret Reddish, MD   Chief Complaint:  Coughing/back pain  HPI: Sandra [name] is a very pleasant 82 y.o.  WF past medical history that includes A. fib currently on our requests, nonobstructive cardiomyopathy status post pacemaker/AICD on a dobutamine drip, hyperlipidemia, hypertension presents to the emergency department with chief complaint of left-sided back pain while at rest. Initial evaluation in the emergency department reveals acute respiratory failure with hypoxia which may be related to pneumonia per chest x-ray.  Information is obtained from the patient and her daughter who is at the bedside. Asked evening she went to [facility] with sudden onset left-sided back pain while at rest. Describes the pain as sharp and rated an 8 out of 10 and constant. Deep breathing made the pain worse. Associated symptoms include worsening cough with thick sputum as well as generalized fatigue and fever. Patient concerned that she stopped taking her Coumadin in anticipation of transitioning to Eliquis and she may have a clot. She denies chest pain palpitations shortness of breath nausea vomiting. She denies feeling her ICD fire. She denies headache dizziness syncope or near-syncope. She denies abdominal pain dysuria hematuria frequency or urgency. He denies any lower extremity edema.  In the emergency department she is afebrile hemodynamically stable and not hypoxic on presentation. She is provided with fentanyl 50 MCG's 2 due to back pain. Oxygen saturation level drops to 86% on room air.    Review of Systems:  10 point review of systems complete and all systems are negative except as indicated in the history of present illness  Past Medical History  Diagnosis Date  . Atrial fibrillation ([place]) permanent     on Tikosyn and Coumadin  . Hyperlipidemia   .  Hypothyroidism   . Bradycardia   . Cardiomyopathy, hypertrophic nonobstructive (HCC)     ejection fraction 25%  . Anal fissure   . Adenomatous colon polyp   . Diverticulosis   . ICD-CRT     generator change 2013  . Systolic heart failure   . Hyperlipidemia   . Hypertension   . RBBB plus LA hemiblock   . Family history of adverse reaction to anesthesia     "middle daughter couldn't take Versed"  . CHF (congestive heart failure) ([place])   . Varicose veins   . Chronic bronchitis ([place])     "get it most q spring and fall" (02/06/2015)  . Migraines     "stopped after I went thru the change"  . History of gout   . Situational anxiety   . Chronic renal insufficiency, stage III (moderate)     gd 3-4  . Basal cell carcinoma     "right foot; right leg" (02/06/2015)  . Aspiration pneumonia ([place]) 03/08/2015    Archie Endo 03/08/2015   Past Surgical History  Procedure Laterality Date  . Cardioversion    . Cardiac defibrillator placement  2007  . Vaginal hysterectomy      uterus alone  . Ovary surgery  1989    "tumor on one of my ovaries; benign  . Biv icd genertaor change out N/A 03/23/2011    Procedure: BIV ICD GENERTAOR CHANGE OUT;  Surgeon: Deboraha Sprang, MD;  Location: [facility] CATH LAB;  Service: Cardiovascular;  Laterality: N/A;  . Colonoscopy    . Cardiac catheterization N/A 10/29/2014    Procedure: Right Heart Cath;  Surgeon: Jolaine Artist, MD;  Location: [facility] CV LAB;  Service: Cardiovascular;  Laterality: N/A;  . Cardioversion N/A 11/09/2014    Procedure: CARDIOVERSION;  Surgeon: Jolaine Artist, MD;  Location: [facility] ENDOSCOPY;  Service: Cardiovascular;  Laterality: N/A;  . Av node ablation  02/06/2015  . Cataract extraction w/ intraocular lens  implant, bilateral Bilateral 2000s  . Colonoscopy    . Picc line place peripheral ([facility] hx) Right 10/2014    arm  . Electrophysiologic study N/A 02/06/2015    Procedure: AV Node Ablation;  Surgeon: Deboraha Sprang, MD;  Location[facility];  Service: Cardiovascular;  Laterality: N/A;   Social History:  reports that she quit smoking about 28 years ago. Her smoking use included Cigarettes. She has a 3.8 pack-year smoking history. She has never used smokeless tobacco. She reports that she does not drink alcohol or use illicit drugs. She lives at home with her husband is on a dobutamine drip. Chart review indicates able to do simple household chores independently Allergies  Allergen Reactions  . Levofloxacin Other (See Comments)    Due to cardiac  AF  . Procaine Hcl Other (See Comments)    REACTION: smothers  . Spironolactone Diarrhea    Abdominal pain  . Amiodarone Nausea And Vomiting  . Tape Other (See Comments)    Tears skin off.  Please use "paper" tape only  . Amoxicillin Other (See Comments)    Rash   . Codeine Other (See Comments)    REACTION: nausea  . Niacin Other (See Comments)    REACTION: rash  . Ramipril Other (See Comments)    unknown  . Statins Other (See Comments)    REACTION: muscle aches    Family History  Problem Relation Age of Onset  . Heart disease Mother   . Colon cancer Father   . COPD Father   . Heart disease Father   . Cardiomyopathy Daughter   . Breast cancer Paternal Aunt   . Colon polyps Father   . Diabetes Neg Hx   . Kidney disease Neg Hx      Prior to Admission medications   Medication Sig Start Date End Date Taking? Authorizing Provider  acetaminophen (TYLENOL) 325 MG tablet Take 650 mg by mouth every 6 (six) hours as needed for mild pain or headache. Reported on 03/07/2015    Historical Provider, MD  apixaban (ELIQUIS) 2.5 MG TABS tablet Take 1 tablet (2.5 mg total) by mouth 2 (two) times daily. 05/23/15   Shaune Pascal Bensimhon, MD  DOBUTamine (DOBUTREX) 4-5 MG/ML-% infusion Inject 264 mcg/min into the vein continuous. 05/23/15   Jolaine Artist, MD  ezetimibe (ZETIA) 10 MG tablet Take 10 mg by mouth every Monday, Wednesday, and Friday.     Historical Provider, MD    fluticasone (FLONASE) 50 MCG/ACT nasal spray Place 2 sprays into both nostrils daily. 09/27/14   Marin Olp, MD  levothyroxine (SYNTHROID, LEVOTHROID) 100 MCG tablet Take 100 mcg by mouth at bedtime.    Historical Provider, MD  potassium chloride (K-DUR,KLOR-CON) 10 MEQ tablet Take 20 mEq by mouth 2 (two) times daily.    Historical Provider, MD  PROAIR HFA 108 [phone] Base) MCG/ACT inhaler INHALE TWO PUFFS INTO LUNGS EVERY 6 HOURS AS NEEDED FOR WHEEZING OR  SHORTNESS OF BREATH 04/16/15   Marin Olp, MD  simethicone (MYLICON) 80 MG chewable tablet Chew 1 tablet (80 mg total) by mouth every 6 (six) hours as needed for flatulence. 03/14/15   Reyne Dumas,  MD  torsemide (DEMADEX) 20 MG tablet Take 2 tablets (40 mg total) by mouth 2 (two) times daily. May take an extra 20mg  as needed for fluid/edema. 05/23/15   Jolaine Artist, MD  triamcinolone cream (KENALOG) 0.1 % Apply 1 application topically 2 (two) times daily as needed (itching/rash).    Historical Provider, MD  VITAMIN E PO Take 1 tablet by mouth daily.    Historical Provider, MD   Physical Exam: Filed Vitals:   05/25/15 0400 05/25/15 0621 05/25/15 0948 05/25/15 1000  BP: 128/75 120/86 124/69   Pulse: 70 67 70   Temp:  98 F (36.7 C)    TempSrc:  Axillary    Resp: 23 21    Height:  5\' 3"  (1.6 m)    Weight:  63 kg (138 lb 14.2 oz)    SpO2: 97% 98% 100% 100%    Wt Readings from Last 3 Encounters:  05/25/15 63 kg (138 lb 14.2 oz)  05/23/15 61.576 kg (135 lb 12 oz)  04/09/15 59.33 kg (130 lb 12.8 oz)    General:  Appears calm and comfortable, somewhat frail Eyes: PERRL, normal lids, irises & conjunctiva ENT: grossly normal hearing, lips & tongue, his membranes of her mouth are moist and pink Neck: no LAD, masses or thyromegaly Cardiovascular: Irregularly irregular, no m/r/g. No LE edema.   Respiratory: Normal effort breath sounds somewhat distant. Fine crackles particularly on the right base. Otherwise a hear no wheeze no  rhonchi Abdomen: soft, ntnd positive bowel sounds no guarding Skin: no rash or induration seen on limited exam Musculoskeletal: grossly normal tone BUE/BLE Psychiatric: grossly normal mood and affect, speech fluent and appropriate Neurologic: grossly non-focal. Speech clear facial symmetry           Labs on Admission:  Basic Metabolic Panel:  Recent Labs Lab 05/20/15 1045 05/20/2015 2358  NA 138 136  K 4.3 4.5  CL 99* 102  CO2 26 23  GLUCOSE 86 112*  BUN 36* 39*  CREATININE 1.84* 1.94*  CALCIUM 9.1 9.2  MG 2.3  --    Liver Function Tests: No results for input(s): AST, ALT, ALKPHOS, BILITOT, PROT, ALBUMIN in the last 168 hours. No results for input(s): LIPASE, AMYLASE in the last 168 hours. No results for input(s): AMMONIA in the last 168 hours. CBC:  Recent Labs Lab 05/20/15 1045 05/25/2015 2358  WBC 4.8 10.3  HGB 11.0* 10.9*  HCT 36.5 35.9*  MCV 82.4 80.9  PLT 205 199   Cardiac Enzymes:  Recent Labs Lab 05/28/2015 2358 05/25/15 0944  TROPONINI 0.14* 0.17*    BNP (last 3 results)  Recent Labs  02/25/15 1115 03/08/15 0120 05/28/2015 2358  BNP 1364.1* 1414.9* 1407.1*    ProBNP (last 3 results) No results for input(s): PROBNP in the last 8760 hours.  CBG: No results for input(s): GLUCAP in the last 168 hours.  Radiological Exams on Admission: Dg Chest 2 View  05/25/2015  CLINICAL DATA:  82 year old female with chest pain. EXAM: CHEST  2 VIEW COMPARISON:  Chest radiograph dated 03/12/2015 and CT dated 03/09/2015 FINDINGS: There is a focal wedge-shaped opacity in the superior segment of the right lower lobe which appears smaller compared to the prior radiograph. Left lung base atelectatic changes/scarring noted. A small right pleural effusion may be present. There is no pneumothorax. There is moderate cardiomegaly. Left pectoral AICD device. The right sided PICC with tip at the cavoatrial junction. No acute fracture. IMPRESSION: Interval decrease in the size of  the  right lower lobe opacity. Moderate cardiomegaly. Electronically Signed   By: Anner Crete M.D.   On: 05/25/2015 01:27    EKG: Independently reviewed. Afib/flutter and ventricular-paced rhythm No further analysis attempted due to paced rhythm  Assessment/Plan Principal Problem:   Acute respiratory failure with hypoxia (HCC) Active Problems:   Hypothyroidism   CKD (chronic kidney disease), stage IV (HCC)   Anxiety   Biventricular implantable cardioverter-defibrillator -St. Jude   Chronic systolic heart failure (HCC)   Persistent atrial fibrillation (HCC)   Aspiration pneumonia (HCC)   Back pain   Chronic systolic CHF (congestive heart failure) (HCC)   NICM (nonischemic cardiomyopathy) ([place])   Other specified hypothyroidism   CKD (chronic kidney disease), stage III  #1. Acute respiratory failure with hypoxia. Oxygen saturation level drops to 86% on room air while in the emergency department(likely iatrogenic secondary to narcotics). Chest x-ray reveals interval decrease in the size of the right lower lobe opacity. Recent history of aspiration pneumonia. Speech eval last hospitalization within the limits of normal. Concern for PE as well unable to obtain CT due to kidney disease.  I suspect may be more related to the fentanyl she received in the emergency department for her back pain.  -We'll admit to telemetry -Continue oxygen supplementation and wean as able -Nebulizers as needed -We will continue antibiotics for aspiration pneumonia initiated in the emergency department and Aero or discontinue  -incentive spirometry -mobilization -minimize sedating meds  #2. Aspiration pneumonia.  -No witnessed event on this occasion. Recent hospitalization for same. Recent swallow eval within the limits of normal. No leukocytosis she is afebrile nontoxic appearing. Tmax temp 101 prior to admission. Lactic acid within the limits of normal Azithromycin and Rocephin she started in the emergency  department. -Follow blood cultures -Would DC ABx in Am if no leukocytosis, left shift, or fever. -Monitor closely  #3. Chronic systolic heart failure due to NICM.  -Echo October 2016 EF 20-25% status post St. Jude CRT-D. Recently visited cardiology. Indicates titrating dobutamine drip down to 4 mics per kilograms per minute. Admission she is stable and compensated. Of note device interrogated 2 days ago at cardiology office. He sent cardiology note indicates base weight 140 pounds -Continue home medications -Intake and output -Daily weights -Heart failure team consult  #4. PAF . [place] vasc score 7. -status post DC-CV - INR is therapeutic. Chart review indicates failed amiodarone and tikosyn. -Continue home medications -Continue eliquis  5. Back pain/elevated troponin.  -Etiology unclear. They be related to #2 and/or demand in the setting of chronic kidney disease. No CAD based on chart review. Patient with a history of elevated troponin due to leak. EKG without acute changes. Improved with fentanyl -monitor -Pain management minimizing sedating meds -Will obtain additional troponin 4 trending purposes  6. Hypothyroidism. -Continue home meds -Check TSH  #7. Chronic kidney disease age 44. -Stable at baseline  Heart failure team -continue current dobutamine drip settings; CHF team notified and will make  Any appropriate changes.  full  Code Status: full DVT Prophylaxis: Family Communication: daughter at bedside Disposition Plan: hopefully home in 24-48 hrs  Time spent: 65 minutes  [facility], [facility] Examined patient and discuss plan of care with  NP Dyanne Carrel and agree with above. Care during the described time interval was provided by myself and NP Dyanne Carrel  .  I have reviewed this patient's available data, including medical history, events of note, physical examination, and all test results as part of my evaluation.  I have personally reviewed and  interpreted all radiology studies.

## 2015-05-25 NOTE — Progress Notes (Signed)
82 yo F chronic systolic HF due to NICM with EF 25% home dobutamine, ICD in place, CKD IV, pAF on warfarin, hypothyroidism who presents with cough.  Was admitted Feb for bad aspiration PNA, states "this feels like that".  Has been coughing, coughing up sputum with rust-specks.  CXR shows lobar opacity.  Hypoxic to upper 80s on RA.  Ceftriaxone and azithromycin given.  To tele inpatient.

## 2015-05-25 NOTE — ED Notes (Signed)
At bedside for decreased sat alarm. BP cuff taking BP. Patient is currently 89% on RA. PLaced on 2 liters [place] related to decreased sats. The patient is awake and alert. Tolerating well.

## 2015-05-26 ENCOUNTER — Observation Stay (HOSPITAL_COMMUNITY): Payer: Medicare Other

## 2015-05-26 DIAGNOSIS — E871 Hypo-osmolality and hyponatremia: Secondary | ICD-10-CM | POA: Diagnosis not present

## 2015-05-26 DIAGNOSIS — I422 Other hypertrophic cardiomyopathy: Secondary | ICD-10-CM | POA: Diagnosis present

## 2015-05-26 DIAGNOSIS — Z881 Allergy status to other antibiotic agents status: Secondary | ICD-10-CM | POA: Diagnosis not present

## 2015-05-26 DIAGNOSIS — Z515 Encounter for palliative care: Secondary | ICD-10-CM | POA: Diagnosis not present

## 2015-05-26 DIAGNOSIS — J9601 Acute respiratory failure with hypoxia: Secondary | ICD-10-CM | POA: Diagnosis not present

## 2015-05-26 DIAGNOSIS — Z7951 Long term (current) use of inhaled steroids: Secondary | ICD-10-CM | POA: Diagnosis not present

## 2015-05-26 DIAGNOSIS — Z95810 Presence of automatic (implantable) cardiac defibrillator: Secondary | ICD-10-CM | POA: Diagnosis not present

## 2015-05-26 DIAGNOSIS — E785 Hyperlipidemia, unspecified: Secondary | ICD-10-CM | POA: Diagnosis present

## 2015-05-26 DIAGNOSIS — Z23 Encounter for immunization: Secondary | ICD-10-CM | POA: Diagnosis not present

## 2015-05-26 DIAGNOSIS — I5023 Acute on chronic systolic (congestive) heart failure: Secondary | ICD-10-CM | POA: Diagnosis not present

## 2015-05-26 DIAGNOSIS — I429 Cardiomyopathy, unspecified: Secondary | ICD-10-CM

## 2015-05-26 DIAGNOSIS — I5022 Chronic systolic (congestive) heart failure: Secondary | ICD-10-CM | POA: Diagnosis not present

## 2015-05-26 DIAGNOSIS — Z888 Allergy status to other drugs, medicaments and biological substances status: Secondary | ICD-10-CM | POA: Diagnosis not present

## 2015-05-26 DIAGNOSIS — J690 Pneumonitis due to inhalation of food and vomit: Secondary | ICD-10-CM | POA: Diagnosis present

## 2015-05-26 DIAGNOSIS — I482 Chronic atrial fibrillation: Secondary | ICD-10-CM | POA: Diagnosis present

## 2015-05-26 DIAGNOSIS — Z66 Do not resuscitate: Secondary | ICD-10-CM | POA: Diagnosis not present

## 2015-05-26 DIAGNOSIS — Z91048 Other nonmedicinal substance allergy status: Secondary | ICD-10-CM | POA: Diagnosis not present

## 2015-05-26 DIAGNOSIS — N184 Chronic kidney disease, stage 4 (severe): Secondary | ICD-10-CM | POA: Diagnosis present

## 2015-05-26 DIAGNOSIS — I481 Persistent atrial fibrillation: Secondary | ICD-10-CM | POA: Diagnosis not present

## 2015-05-26 DIAGNOSIS — E039 Hypothyroidism, unspecified: Secondary | ICD-10-CM | POA: Diagnosis present

## 2015-05-26 DIAGNOSIS — I130 Hypertensive heart and chronic kidney disease with heart failure and stage 1 through stage 4 chronic kidney disease, or unspecified chronic kidney disease: Secondary | ICD-10-CM | POA: Diagnosis present

## 2015-05-26 DIAGNOSIS — Z7901 Long term (current) use of anticoagulants: Secondary | ICD-10-CM | POA: Diagnosis not present

## 2015-05-26 DIAGNOSIS — Z87891 Personal history of nicotine dependence: Secondary | ICD-10-CM | POA: Diagnosis not present

## 2015-05-26 DIAGNOSIS — J189 Pneumonia, unspecified organism: Secondary | ICD-10-CM

## 2015-05-26 DIAGNOSIS — I480 Paroxysmal atrial fibrillation: Secondary | ICD-10-CM | POA: Diagnosis present

## 2015-05-26 DIAGNOSIS — E875 Hyperkalemia: Secondary | ICD-10-CM | POA: Diagnosis not present

## 2015-05-26 DIAGNOSIS — J42 Unspecified chronic bronchitis: Secondary | ICD-10-CM | POA: Diagnosis present

## 2015-05-26 DIAGNOSIS — Z8249 Family history of ischemic heart disease and other diseases of the circulatory system: Secondary | ICD-10-CM | POA: Diagnosis not present

## 2015-05-26 DIAGNOSIS — R570 Cardiogenic shock: Secondary | ICD-10-CM | POA: Diagnosis not present

## 2015-05-26 DIAGNOSIS — M5489 Other dorsalgia: Secondary | ICD-10-CM | POA: Diagnosis present

## 2015-05-26 LAB — BASIC METABOLIC PANEL
ANION GAP: 13 (ref 5–15)
Anion gap: 11 (ref 5–15)
BUN: 37 mg/dL — AB (ref 6–20)
BUN: 41 mg/dL — AB (ref 6–20)
CALCIUM: 9 mg/dL (ref 8.9–10.3)
CO2: 25 mmol/L (ref 22–32)
CO2: 19 mmol/L — AB (ref 22–32)
CREATININE: 2.12 mg/dL — AB (ref 0.44–1.00)
Calcium: 9.3 mg/dL (ref 8.9–10.3)
Chloride: 99 mmol/L — ABNORMAL LOW (ref 101–111)
Chloride: 98 mmol/L — ABNORMAL LOW (ref 101–111)
Creatinine, Ser: 2.29 mg/dL — ABNORMAL HIGH (ref 0.44–1.00)
GFR calc Af Amer: 24 mL/min — ABNORMAL LOW (ref >60)
GFR calc Af Amer: 22 mL/min — ABNORMAL LOW (ref >60)
GFR calc non Af Amer: 21 mL/min — ABNORMAL LOW (ref >60)
GFR calc non Af Amer: 19 mL/min — ABNORMAL LOW (ref >60)
GLUCOSE: 106 mg/dL — AB (ref 65–99)
Glucose, Bld: 78 mg/dL (ref 65–99)
POTASSIUM: 5.7 mmol/L — AB (ref 3.5–5.1)
POTASSIUM: 5.7 mmol/L — AB (ref 3.5–5.1)
SODIUM: 135 mmol/L (ref 135–145)
Sodium: 130 mmol/L — ABNORMAL LOW (ref 135–145)

## 2015-05-26 LAB — CBC
HCT: 36.6 % (ref 36.0–46.0)
Hemoglobin: 11 g/dL — ABNORMAL LOW (ref 12.0–15.0)
MCH: 24.9 pg — ABNORMAL LOW (ref 26.0–34.0)
MCHC: 30.1 g/dL (ref 30.0–36.0)
MCV: 83 fL (ref 78.0–100.0)
Platelets: 179 10*3/uL (ref 150–400)
RBC: 4.41 MIL/uL (ref 3.87–5.11)
RDW: 20 % — ABNORMAL HIGH (ref 11.5–15.5)
WBC: 16.1 10*3/uL — ABNORMAL HIGH (ref 4.0–10.5)

## 2015-05-26 MED ORDER — SODIUM POLYSTYRENE SULFONATE 15 GM/60ML PO SUSP
15.0000 g | Freq: Once | ORAL | Status: DC
  Filled 2015-05-26: qty 60

## 2015-05-26 MED ORDER — GUAIFENESIN-DM 100-10 MG/5ML PO SYRP
5.0000 mL | ORAL_SOLUTION | ORAL | Status: DC | PRN
  Administered 2015-05-26 – 2015-06-02 (×14): 5 mL via ORAL
  Filled 2015-05-26 (×14): qty 5

## 2015-05-26 MED ORDER — TORSEMIDE 20 MG PO TABS
40.0000 mg | ORAL_TABLET | Freq: Two times a day (BID) | ORAL | Status: DC
  Administered 2015-05-27 – 2015-05-28 (×3): 40 mg via ORAL
  Filled 2015-05-26 (×4): qty 2

## 2015-05-26 MED ORDER — HYDROCODONE-ACETAMINOPHEN 5-325 MG PO TABS
1.0000 | ORAL_TABLET | Freq: Four times a day (QID) | ORAL | Status: AC | PRN
  Administered 2015-05-26 – 2015-05-27 (×2): 1 via ORAL
  Filled 2015-05-26 (×2): qty 1

## 2015-05-26 NOTE — Progress Notes (Signed)
Pharmacist Heart Failure Core Measure Documentation  Assessment: Sandra [name] has an EF documented as 20-25 on Echo.  Rationale: Heart failure patients with left ventricular systolic dysfunction (LVSD) and an EF < 40% should be prescribed an angiotensin converting enzyme inhibitor (ACEI) or angiotensin receptor blocker (ARB) at discharge unless a contraindication is documented in the medical record.  This patient is not currently on an ACEI or ARB for HF.  This note is being placed in the record in order to provide documentation that a contraindication to the use of these agents is present for this encounter.  ACE Inhibitor or Angiotensin Receptor Blocker is contraindicated (specify all that apply)  []   ACEI allergy AND ARB allergy []   Angioedema []   Moderate or severe aortic stenosis [x]   Hyperkalemia []   Hypotension []   Renal artery stenosis [x]   Worsening renal function, preexisting renal disease or dysfunction   Sandra [name] 05/26/2015 2:58 PM

## 2015-05-26 NOTE — Progress Notes (Signed)
PT Cancellation Note  Patient Details Name: Sandra [name] MRN: [id number] DOB: [birth year]   Cancelled Treatment:    Reason Eval/Treat Not Completed: Medical issues which prohibited therapy (Potassium 5.7)  Will return to see pt to attempt PT evaluation tomorrow (05/27/15).  Thank you for this order.  Collie Siad PT, DPT  Pager: [phone] Phone: [phone] 05/26/2015, 1:14 PM

## 2015-05-26 NOTE — Progress Notes (Signed)
PROGRESS NOTE                                                                                                                                                                                                             Patient Demographics:    Sandra [name], is a 82 y.o. female, DOB - Oct 24, 1932, [id number]  Admit date - 06/01/2015   Admitting Physician Allie Bossier, MD  Outpatient Primary MD for the patient is Garret Reddish, MD  LOS - 1   Brief Narrative   82 y.o. WF past medical history that includes A. fib currently on our requests, nonobstructive cardiomyopathy status post pacemaker/AICD on a dobutamine drip, hyperlipidemia, hypertension presents to the emergency department with chief complaint of left-sided back pain while at rest. Initial evaluation in the emergency department reveals acute respiratory failure with hypoxia which may be related to pneumonia per chest x-ray.   Subjective:    [name] today has no new complaints. Breathing is minimally improved. She is reporting cough   Assessment  & Plan :    Principal Problem:   Acute respiratory failure with hypoxia ([place]) - mixed etiology most likely given history of HF.  - Continue abx - Appreciate cardiology's assistance pt being diuresed  Hyperkalemia - offered kayexalate but patient and family preferred to hold K supplementation and monitor. Since patient is on diuretic I have agreed. Will place holding parameters for oral K regimen.  Active Problems:   Hypothyroidism - Stable continue synthroid    CKD (chronic kidney disease), stage IV (HCC) - Stable. Will continue to monitor S creatinine    Biventricular implantable cardioverter-defibrillator -St. Jude   Chronic systolic heart failure [facility]) - May have acute on chronic exacerbation. Currently cardiology managing   Persistent atrial fibrillation (HCC)   Aspiration pneumonia (HCC)  Back pain   Chronic systolic CHF (congestive heart failure) (HCC)   NICM (nonischemic cardiomyopathy) ([place])   Other specified hypothyroidism   CKD (chronic kidney disease), stage III   Code Status : full  Family Communication  : d/c patient and family  Disposition Plan  : pending improvement in condition  Consults  :  Cardiology  Procedures  : None  DVT Prophylaxis  :  On Eliquis  Lab Results  Component Value Date   PLT 179 05/26/2015    Antibiotics  :  Azithromycin Ceftriaxone Anti-infectives    Start     Dose/Rate Route Frequency Ordered Stop   05/26/15 1000  cefTRIAXone (ROCEPHIN) 2 g in dextrose 5 % 50 mL IVPB     2 g 100 mL/hr over 30 Minutes Intravenous Every 24 hours 05/25/15 0836     05/25/15 1000  azithromycin (ZITHROMAX) 250 mg in dextrose 5 % 125 mL IVPB     250 mg 125 mL/hr over 60 Minutes Intravenous Every 24 hours 05/25/15 0804     05/25/15 0412  azithromycin (ZITHROMAX) 500 MG injection  Status:  Discontinued    Comments:  Collene Gobble   : cabinet override      05/25/15 0412 05/25/15 0815   05/25/15 0145  cefTRIAXone (ROCEPHIN) 1 g in dextrose 5 % 50 mL IVPB     1 g 100 mL/hr over 30 Minutes Intravenous  Once 05/25/15 0135 05/25/15 0420   05/25/15 0145  azithromycin (ZITHROMAX) 500 mg in dextrose 5 % 250 mL IVPB     500 mg 250 mL/hr over 60 Minutes Intravenous  Once 05/25/15 0135 05/25/15 0522        Objective:   Filed Vitals:   05/25/15 1632 05/25/15 2023 05/26/15 0438 05/26/15 0844  BP: 115/75 111/74 121/71 118/73  Pulse: 69 72 71 69  Temp: 98.2 F (36.8 C) 98.4 F (36.9 C) 98 F (36.7 C)   TempSrc: Oral Oral Oral   Resp: 20 18 18    Height:      Weight:   62 kg (136 lb 11 oz)   SpO2: 98% 98% 98% 94%    Wt Readings from Last 3 Encounters:  05/26/15 62 kg (136 lb 11 oz)  05/23/15 61.576 kg (135 lb 12 oz)  04/09/15 59.33 kg (130 lb 12.8 oz)     Intake/Output Summary (Last 24 hours) at 05/26/15 1319 Last data filed at 05/26/15  0443  Gross per 24 hour  Intake      0 ml  Output    200 ml  Net   -200 ml     Physical Exam  Awake Alert and awake Supple Neck,No JVD, No cervical lymphadenopathy appreciated.  Symmetrical Chest wall movement, no wheezes S1 and S2 wnl, no rubs +ve B.Sounds, Abd Soft, No tenderness, No organomegaly appriciated, No rebound - guarding or rigidity. No Cyanosis, Clubbing      Data Review:    CBC  Recent Labs Lab 05/20/15 1045 05/27/2015 2358 05/26/15 0454  WBC 4.8 10.3 16.1*  HGB 11.0* 10.9* 11.0*  HCT 36.5 35.9* 36.6  PLT 205 199 179  MCV 82.4 80.9 83.0  MCH 24.8* 24.5* 24.9*  MCHC 30.1 30.4 30.1  RDW 20.5* 21.0* 20.0*    Chemistries   Recent Labs Lab 05/20/15 1045 05/29/2015 2358 05/26/15 0454 05/26/15 0929  NA 138 136 135 130*  K 4.3 4.5 5.7* 5.7*  CL 99* 102 99* 98*  CO2 26 23 25  19*  GLUCOSE 86 112* 78 106*  BUN 36* 39* 37* 41*  CREATININE 1.84* 1.94* 2.12* 2.29*  CALCIUM 9.1 9.2 9.3 9.0  MG 2.3  --   --   --    ------------------------------------------------------------------------------------------------------------------ No results for input(s): CHOL, HDL, LDLCALC, TRIG, CHOLHDL, LDLDIRECT in the last 72 hours.  No results found for: HGBA1C ------------------------------------------------------------------------------------------------------------------ No results for input(s): TSH, T4TOTAL, T3FREE, THYROIDAB in the last 72 hours.  Invalid input(s): FREET3 ------------------------------------------------------------------------------------------------------------------ No results for input(s): VITAMINB12, FOLATE, FERRITIN, TIBC, IRON, RETICCTPCT in the last 72 hours.  Coagulation profile  Recent Labs Lab 05/20/15 05/13/2015 2358  INR 2.4 2.38*    No results for input(s): DDIMER in the last 72 hours.  Cardiac Enzymes  Recent Labs Lab 06/07/2015 2358 05/25/15 0944  TROPONINI 0.14* 0.17*    ------------------------------------------------------------------------------------------------------------------    Component Value Date/Time   BNP 1407.1* 06/03/2015 2358    Inpatient Medications  Scheduled Meds: . apixaban  2.5 mg Oral BID  . azithromycin  250 mg Intravenous Q24H  . cefTRIAXone (ROCEPHIN)  IV  2 g Intravenous Q24H  . [START ON 05/27/2015] ezetimibe  10 mg Oral Q M,W,F  . fluticasone  2 spray Each Nare Daily  . levothyroxine  100 mcg Oral QHS  . ondansetron (ZOFRAN) IV  4 mg Intravenous Once  . potassium chloride  20 mEq Oral BID  . sodium chloride flush  3 mL Intravenous Q12H  . sodium chloride flush  3 mL Intravenous Q12H  . sodium polystyrene  15 g Oral Once  . [START ON 05/27/2015] torsemide  40 mg Oral BID   Continuous Infusions: . DOBUTamine 4 mcg/kg/min (05/25/15 1144)   PRN Meds:.sodium chloride, acetaminophen, albuterol, senna-docusate, simethicone, sodium chloride flush, sodium chloride flush, triamcinolone cream  Micro Results Recent Results (from the past 240 hour(s))  Blood culture (routine x 2)     Status: None (Preliminary result)   Collection Time: 06/05/2015 11:58 PM  Result Value Ref Range Status   Specimen Description BLOOD LEFT ANTECUBITAL  Final   Special Requests   Final    BOTTLES DRAWN AEROBIC AND ANAEROBIC AER 5cc ANA 5cc   Culture   Final    NO GROWTH 1 DAY Performed at [facility]    Report Status PENDING  Incomplete  Blood culture (routine x 2)     Status: None (Preliminary result)   Collection Time: 06/11/2015 11:58 PM  Result Value Ref Range Status   Specimen Description BLOOD ARM LEFT  Final   Special Requests   Final    BOTTLES DRAWN AEROBIC AND ANAEROBIC AER 5cc ANA 5cc   Culture   Final    NO GROWTH 1 DAY Performed at [facility]    Report Status PENDING  Incomplete  Culture, sputum-assessment     Status: None   Collection Time: 05/25/15  4:08 PM  Result Value Ref Range Status   Specimen  Description SPUTUM  Final   Special Requests NONE  Final   Sputum evaluation   Final    MICROSCOPIC FINDINGS SUGGEST THAT THIS SPECIMEN IS NOT REPRESENTATIVE OF LOWER RESPIRATORY SECRETIONS. PLEASE RECOLLECT. CALLED TO [facility] RN 05/25/15 1716 [place]    Report Status 05/25/2015 FINAL  Final    Radiology Reports Dg Chest 2 View  05/26/2015  CLINICAL DATA:  82 year old female with the with pleuritic chest pain. Evaluate for pneumonia. EXAM: CHEST  2 VIEW COMPARISON:  Chest x-ray 05/26/2015. FINDINGS: Persistent left lower lobe airspace consolidation, concerning for pneumonia. Moderate left pleural effusion. Small right pleural effusion, some of which is noted dependently, with some of apparently loculated in the posterior aspect of the right major fissure, similar to prior examinations. Cardiomegaly. Pulmonary venous congestion, without frank pulmonary edema. Upper mediastinal contours are within normal limits. Atherosclerosis in the thoracic aorta. Left-sided biventricular pacemaker/AICD with lead tips projecting over the expected location of the right atrium, right ventricular apex and overlying the lateral wall the left ventricle via the coronary sinus and coronary veins. Right upper extremity PICC with tip terminating at the superior cavoatrial junction. IMPRESSION: 1. Support  apparatus, as above. 2. Left lower lobe pneumonia with moderate left parapneumonic pleural effusion. 3. Small right pleural effusion, partially loculated in the right major fissure, similar prior studies. 4. Cardiomegaly with pulmonary venous congestion. 5. Atherosclerosis. Electronically Signed   By: Vinnie Langton M.D.   On: 05/26/2015 12:46   Dg Chest 2 View  05/25/2015  CLINICAL DATA:  82 year old female with chest pain. EXAM: CHEST  2 VIEW COMPARISON:  Chest radiograph dated 03/12/2015 and CT dated 03/09/2015 FINDINGS: There is a focal wedge-shaped opacity in the superior segment of the right lower lobe which appears  smaller compared to the prior radiograph. Left lung base atelectatic changes/scarring noted. A small right pleural effusion may be present. There is no pneumothorax. There is moderate cardiomegaly. Left pectoral AICD device. The right sided PICC with tip at the cavoatrial junction. No acute fracture. IMPRESSION: Interval decrease in the size of the right lower lobe opacity. Moderate cardiomegaly. Electronically Signed   By: Anner Crete M.D.   On: 05/25/2015 01:27   Dg Chest Port 1 View  05/26/2015  CLINICAL DATA:  Hypoxia EXAM: PORTABLE CHEST 1 VIEW COMPARISON:  05/25/2015 FINDINGS: Cardiac enlargement with pulmonary vascular congestion which appears slightly progressive. Left lower lobe airspace disease has progressed. Small pleural effusions bilaterally. Improvement in right perihilar density. AICD unchanged in position. Right arm PICC tip in the right atrium unchanged. IMPRESSION: Progressive vascular congestion suggesting fluid overload Progressive of left lower lobe atelectasis/ infiltrate. Improvement in right perihilar density Electronically Signed   By: Franchot Gallo M.D.   On: 05/26/2015 10:38    Time Spent in minutes  25   Velvet Bathe M.D on 05/26/2015 at 1:19 PM  Between 7am to 7pm - Pager - [phone]  After 7pm go to [email or web address] - password [facility]  [facility] -  Office  [phone]

## 2015-05-26 NOTE — Progress Notes (Signed)
Patient Name: Sandra [name]      SUBJECTIVE: admitted with abrupt onset chest pain and sob Mild volume overload and IV torsemide given yday  Cr 1.8>>2.1 WBC 10>>16 but no fever on abx K 4.3>>5.7 ??? Still with pain in L side  Past Medical History  Diagnosis Date  . Atrial fibrillation ([place]) permanent     on Tikosyn and Coumadin  . Hyperlipidemia   . Hypothyroidism   . Bradycardia   . Cardiomyopathy, hypertrophic nonobstructive (HCC)     ejection fraction 25%  . Anal fissure   . Adenomatous colon polyp   . Diverticulosis   . ICD-CRT     generator change 2013  . Systolic heart failure   . Hyperlipidemia   . Hypertension   . RBBB plus LA hemiblock   . Family history of adverse reaction to anesthesia     "middle daughter couldn't take Versed"  . CHF (congestive heart failure) ([place])   . Varicose veins   . Chronic bronchitis ([place])     "get it most q spring and fall" (02/06/2015)  . Migraines     "stopped after I went thru the change"  . History of gout   . Situational anxiety   . Chronic renal insufficiency, stage III (moderate)     gd 3-4  . Basal cell carcinoma     "right foot; right leg" (02/06/2015)  . Aspiration pneumonia ([place]) 03/08/2015    Archie Endo 03/08/2015    Scheduled Meds:  Scheduled Meds: . apixaban  2.5 mg Oral BID  . azithromycin  250 mg Intravenous Q24H  . cefTRIAXone (ROCEPHIN)  IV  2 g Intravenous Q24H  . [START ON 05/27/2015] ezetimibe  10 mg Oral Q M,W,F  . fluticasone  2 spray Each Nare Daily  . levothyroxine  100 mcg Oral QHS  . ondansetron (ZOFRAN) IV  4 mg Intravenous Once  . potassium chloride  20 mEq Oral BID  . sodium chloride flush  3 mL Intravenous Q12H  . sodium chloride flush  3 mL Intravenous Q12H  . torsemide  40 mg Oral BID   Continuous Infusions: . DOBUTamine 4 mcg/kg/min (05/25/15 1144)   sodium chloride, acetaminophen, albuterol, senna-docusate, simethicone, sodium chloride flush, sodium chloride flush,  triamcinolone cream    PHYSICAL EXAM Filed Vitals:   05/25/15 1632 05/25/15 2023 05/26/15 0438 05/26/15 0844  BP: 115/75 111/74 121/71 118/73  Pulse: 69 72 71 69  Temp: 98.2 F (36.8 C) 98.4 F (36.9 C) 98 F (36.7 C)   TempSrc: Oral Oral Oral   Resp: 20 18 18    Height:      Weight:   136 lb 11 oz (62 kg)   SpO2: 98% 98% 98% 94%   Well developed and nourished in no acute distress HENT normal Neck supple with JVP-8 Crackles L post chest Regular rate and rhythm, no murmurs or gallops Abd-soft with active BS No Clubbing cyanosis edema Skin-warm and dry A & Oriented  Grossly normal sensory and motor function   TELEMETRY: Reviewed telemetry pt in V pacing    Intake/Output Summary (Last 24 hours) at 05/26/15 0910 Last data filed at 05/26/15 0443  Gross per 24 hour  Intake      0 ml  Output    200 ml  Net   -200 ml    LABS: Basic Metabolic Panel:  Recent Labs Lab 05/20/15 1045 05/30/2015 2358 05/26/15 0454  NA 138 136 135  K 4.3 4.5 5.7*  CL 99* 102 99*  CO2 26 23 25   GLUCOSE 86 112* 78  BUN 36* 39* 37*  CREATININE 1.84* 1.94* 2.12*  CALCIUM 9.1 9.2 9.3  MG 2.3  --   --    Cardiac Enzymes:  Recent Labs  06/11/2015 2358 05/25/15 0944  TROPONINI 0.14* 0.17*   CBC:  Recent Labs Lab 05/20/15 1045 05/23/2015 2358 05/26/15 0454  WBC 4.8 10.3 16.1*  HGB 11.0* 10.9* 11.0*  HCT 36.5 35.9* 36.6  MCV 82.4 80.9 83.0  PLT 205 199 179   PROTIME:  Recent Labs  05/28/2015 2358  LABPROT 25.7*  INR 2.38*   Liver Function Tests: No results for input(s): AST, ALT, ALKPHOS, BILITOT, PROT, ALBUMIN in the last 72 hours. No results for input(s): LIPASE, AMYLASE in the last 72 hours. BNP: BNP (last 3 results)  Recent Labs  02/25/15 1115 03/08/15 0120 06/12/2015 2358  BNP 1364.1* 1414.9* 1407.1*    *   ASSESSMENT AND PLAN:  Principal Problem:   Acute respiratory failure with hypoxia (HCC) Active Problems:   Biventricular implantable  cardioverter-defibrillator -St. Jude   Hypothyroidism   CKD (chronic kidney disease), stage IV (HCC)   Anxiety   Chronic systolic heart failure (HCC)   Persistent atrial fibrillation (HCC)   Aspiration pneumonia (HCC)   Back pain   Chronic systolic CHF (congestive heart failure) (HCC)   NICM (nonischemic cardiomyopathy) (HCC)   Other specified hypothyroidism   CKD (chronic kidney disease), stage III  K elevated and Cr abit Got torsemide this am, will hold tonights dose D/c K Will repeat CXR to clarify clinical pneumonia   Signed, Virl Axe MD  05/26/2015

## 2015-05-27 LAB — BASIC METABOLIC PANEL
ANION GAP: 11 (ref 5–15)
BUN: 48 mg/dL — AB (ref 6–20)
CHLORIDE: 94 mmol/L — AB (ref 101–111)
CO2: 24 mmol/L (ref 22–32)
Calcium: 9.3 mg/dL (ref 8.9–10.3)
Creatinine, Ser: 2.8 mg/dL — ABNORMAL HIGH (ref 0.44–1.00)
GFR calc Af Amer: 17 mL/min — ABNORMAL LOW (ref >60)
GFR, EST NON AFRICAN AMERICAN: 15 mL/min — AB (ref >60)
GLUCOSE: 82 mg/dL (ref 65–99)
POTASSIUM: 6.6 mmol/L — AB (ref 3.5–5.1)
Sodium: 129 mmol/L — ABNORMAL LOW (ref 135–145)

## 2015-05-27 LAB — POTASSIUM: Potassium: 5.8 mmol/L — ABNORMAL HIGH (ref 3.5–5.1)

## 2015-05-27 MED ORDER — DEXTROSE 50 % IV SOLN
1.0000 | Freq: Once | INTRAVENOUS | Status: AC
  Administered 2015-05-27: 50 mL via INTRAVENOUS
  Filled 2015-05-27: qty 50

## 2015-05-27 MED ORDER — TRAMADOL HCL 50 MG PO TABS
50.0000 mg | ORAL_TABLET | Freq: Two times a day (BID) | ORAL | Status: DC | PRN
  Administered 2015-05-27 – 2015-05-28 (×2): 25 mg via ORAL
  Filled 2015-05-27 (×3): qty 1

## 2015-05-27 MED ORDER — SODIUM POLYSTYRENE SULFONATE 15 GM/60ML PO SUSP
15.0000 g | ORAL | Status: AC
  Filled 2015-05-27: qty 60

## 2015-05-27 MED ORDER — INSULIN ASPART 100 UNIT/ML IV SOLN
10.0000 [IU] | Freq: Once | INTRAVENOUS | Status: AC
  Administered 2015-05-27: 10 [IU] via INTRAVENOUS

## 2015-05-27 MED ORDER — SODIUM POLYSTYRENE SULFONATE 15 GM/60ML PO SUSP
30.0000 g | Freq: Once | ORAL | Status: AC
  Administered 2015-05-27: 30 g via ORAL
  Filled 2015-05-27: qty 120

## 2015-05-27 MED ORDER — PNEUMOCOCCAL VAC POLYVALENT 25 MCG/0.5ML IJ INJ
0.5000 mL | INJECTION | INTRAMUSCULAR | Status: AC
  Administered 2015-06-02: 0.5 mL via INTRAMUSCULAR
  Filled 2015-05-27: qty 0.5

## 2015-05-27 NOTE — Progress Notes (Addendum)
PT Cancellation Note  Patient Details Name: Sandra [name] MRN: [id number] DOB: 10-20-1932   Cancelled Treatment:    Reason Eval/Treat Not Completed: Pain limiting ability to participate (pt c/o severe flank pain at present (RN aware), pt/family request PT try after lunch today. Will follow. )   [name] 05/27/2015, 9:38 AM [phone]

## 2015-05-27 NOTE — Progress Notes (Signed)
Pt had a persistent cough through the night. Robitussin was administered. Still complains about the pain on her left flank,  Hydrocodone was ordered PRN. Her potassium was elevated, MD notified new order received.

## 2015-05-27 NOTE — Evaluation (Signed)
Clinical/Bedside Swallow Evaluation Patient Details  Name: Sandra [name] MRN: [id number] Date of Birth: 1932-10-05  Today's Date: 05/27/2015 Time: SLP Start Time (ACUTE ONLY): 1142 SLP Stop Time (ACUTE ONLY): 1154 SLP Time Calculation (min) (ACUTE ONLY): 12 min  Past Medical History:  Past Medical History  Diagnosis Date  . Atrial fibrillation ([place]) permanent     on Tikosyn and Coumadin  . Hyperlipidemia   . Hypothyroidism   . Bradycardia   . Cardiomyopathy, hypertrophic nonobstructive (HCC)     ejection fraction 25%  . Anal fissure   . Adenomatous colon polyp   . Diverticulosis   . ICD-CRT     generator change 2013  . Systolic heart failure   . Hyperlipidemia   . Hypertension   . RBBB plus LA hemiblock   . Family history of adverse reaction to anesthesia     "middle daughter couldn't take Versed"  . CHF (congestive heart failure) ([place])   . Varicose veins   . Chronic bronchitis ([place])     "get it most q spring and fall" (02/06/2015)  . Migraines     "stopped after I went thru the change"  . History of gout   . Situational anxiety   . Chronic renal insufficiency, stage III (moderate)     gd 3-4  . Basal cell carcinoma     "right foot; right leg" (02/06/2015)  . Aspiration pneumonia ([place]) 03/08/2015    Sandra [name] 03/08/2015   Past Surgical History:  Past Surgical History  Procedure Laterality Date  . Cardioversion    . Cardiac defibrillator placement  2007  . Vaginal hysterectomy      uterus alone  . Ovary surgery  1989    "tumor on one of my ovaries; benign  . Biv icd genertaor change out N/A 03/23/2011    Procedure: BIV ICD GENERTAOR CHANGE OUT;  Surgeon: Deboraha Sprang, MD;  Location: [facility] CATH LAB;  Service: Cardiovascular;  Laterality: N/A;  . Colonoscopy    . Cardiac catheterization N/A 10/29/2014    Procedure: Right Heart Cath;  Surgeon: Jolaine Artist, MD;  Location: [facility] CV LAB;  Service: Cardiovascular;  Laterality: N/A;  . Cardioversion N/A  11/09/2014    Procedure: CARDIOVERSION;  Surgeon: Jolaine Artist, MD;  Location: [facility] ENDOSCOPY;  Service: Cardiovascular;  Laterality: N/A;  . Av node ablation  02/06/2015  . Cataract extraction w/ intraocular lens  implant, bilateral Bilateral 2000s  . Colonoscopy    . Picc line place peripheral ([facility] hx) Right 10/2014    arm  . Electrophysiologic study N/A 02/06/2015    Procedure: AV Node Ablation;  Surgeon: Deboraha Sprang, MD;  Location: [facility] CV LAB;  Service: Cardiovascular;  Laterality: N/A;   HPI:  82 y.o. female admitted with acute respiratory failure with hypoxia, pna. Hx includes CHF, CKD, defibrillator.  02/06/15 pt presented to the ER with choking event and subsequent aspiration pna; swallow evaluation at the time revealed no s/s of dysphagia.  Regular diet was recommended at the time.    Assessment / Plan / Recommendation Clinical Impression  Pt presents with normal oropharyngeal swallow with no indications of a dysphagia contributing to pna.  Presents with sufficient mastication, brisk swallow, adequate respiratory/swallowing reciprocity, no s/s of aspiration.  Continue current diet.  No SLP f/u warranted.     Aspiration Risk  No limitations    Diet Recommendation     Medication Administration: Whole meds with liquid    Other  Recommendations Oral  Care Recommendations: Oral care BID   Follow up Recommendations  None    Frequency and Duration                Swallow Study   General Date of Onset: 05/19/2015 HPI: 82 y.o. female admitted with acute respiratory failure with hypoxia, pna. Hx includes CHF, CKD, defibrillator.  02/06/15 pt presented to the ER with choking event and subsequent aspiration pna; swallow evaluation at the time revealed no s/s of dysphagia.  Regular diet was recommended at the time.  Type of Study: Bedside Swallow Evaluation Previous Swallow Assessment: Jan 2017 Diet Prior to this Study: Regular;Thin liquids Temperature Spikes Noted:  No Respiratory Status: Nasal cannula History of Recent Intubation: No Behavior/Cognition: Alert;Agitated Oral Cavity Assessment: Within Functional Limits Oral Care Completed by SLP: No Oral Cavity - Dentition: Adequate natural dentition Vision: Functional for self-feeding Patient Positioning: Upright in bed Baseline Vocal Quality: Normal Volitional Cough: Strong Volitional Swallow: Able to elicit    Oral/Motor/Sensory Function Overall Oral Motor/Sensory Function: Within functional limits   Ice Chips Ice chips: Within functional limits   Thin Liquid Thin Liquid: Within functional limits    Nectar Thick Nectar Thick Liquid: Not tested   Honey Thick Honey Thick Liquid: Not tested   Puree Puree: Within functional limits   Solid   GO   Solid: Within functional limits        Sandra [name] 05/27/2015,11:54 AM

## 2015-05-27 NOTE — Progress Notes (Signed)
PT Cancellation Note  Patient Details Name: FINNLEE BARRIO MRN: [id number] DOB: [birth year]   Cancelled Treatment:    Reason Eval/Treat Not Completed: Pain limiting ability to participate (pt reports continued pain in L flank and stated she feels too sick to get up right now. At baseline she ambulates without an assistive device. Will attempt again tomorrow. )   [name] 05/27/2015, 12:35 PM [phone]

## 2015-05-27 NOTE — Progress Notes (Signed)
PROGRESS NOTE                                                                                                                                                                                                             Patient Demographics:    Sandra [name], is a 82 y.o. female, DOB - [birth year], [id number]  Admit date - 05/23/2015   Admitting Physician Allie Bossier, MD  Outpatient Primary MD for the patient is Garret Reddish, MD  LOS - 2   Brief Narrative   82 y.o. WF past medical history that includes A. fib currently on our requests, nonobstructive cardiomyopathy status post pacemaker/AICD on a dobutamine drip, hyperlipidemia, hypertension presents to the emergency department with chief complaint of left-sided back pain while at rest. Initial evaluation in the emergency department reveals acute respiratory failure with hypoxia which may be related to pneumonia per chest x-ray.   Subjective:    Sandra [name] today has no new complaints. Breathing is minimally improved. She is reporting cough   Assessment  & Plan :    Principal Problem:   Acute respiratory failure with hypoxia ([place]) - mixed etiology most likely given history of HF.  - Continue abx - Appreciate cardiology's assistance pt being diuresed  Hyperkalemia - administer Kayexalate x1 - Will reassess K levels  Active Problems:   Hypothyroidism - Stable continue synthroid    CKD (chronic kidney disease), stage IV (HCC) - Stable. Will continue to monitor S creatinine    Biventricular implantable cardioverter-defibrillator -[employer]   Chronic systolic heart failure [facility]) - May have acute on chronic exacerbation. Currently cardiology managing   Persistent atrial fibrillation (HCC)   Aspiration pneumonia (HCC)   Back pain   Chronic systolic CHF (congestive heart failure) (HCC)   NICM (nonischemic cardiomyopathy) ([place])   Other specified  hypothyroidism   CKD (chronic kidney disease), stage III   Code Status : full  Family Communication  : d/c patient and family  Disposition Plan  : pending improvement in condition  Consults  :  Cardiology  Procedures  : None  DVT Prophylaxis  :  On Eliquis  Lab Results  Component Value Date   PLT 179 05/26/2015    Antibiotics  :   Azithromycin Ceftriaxone Anti-infectives    Start     Dose/Rate Route Frequency Ordered Stop  05/26/15 1000  cefTRIAXone (ROCEPHIN) 2 g in dextrose 5 % 50 mL IVPB     2 g 100 mL/hr over 30 Minutes Intravenous Every 24 hours 05/25/15 0836     05/25/15 1000  azithromycin (ZITHROMAX) 250 mg in dextrose 5 % 125 mL IVPB     250 mg 125 mL/hr over 60 Minutes Intravenous Every 24 hours 05/25/15 0804     05/25/15 0412  azithromycin (ZITHROMAX) 500 MG injection  Status:  Discontinued    Comments:  Collene Gobble   : cabinet override      05/25/15 0412 05/25/15 0815   05/25/15 0145  cefTRIAXone (ROCEPHIN) 1 g in dextrose 5 % 50 mL IVPB     1 g 100 mL/hr over 30 Minutes Intravenous  Once 05/25/15 0135 05/25/15 0420   05/25/15 0145  azithromycin (ZITHROMAX) 500 mg in dextrose 5 % 250 mL IVPB     500 mg 250 mL/hr over 60 Minutes Intravenous  Once 05/25/15 0135 05/25/15 0522        Objective:   Filed Vitals:   05/26/15 1700 05/26/15 2000 05/27/15 0620 05/27/15 0623  BP:  121/76  107/63  Pulse: 72 71  70  Temp: 97.6 F (36.4 C) 98 F (36.7 C)  98.1 F (36.7 C)  TempSrc:  Oral  Oral  Resp:  20    Height:      Weight:    63.912 kg (140 lb 14.4 oz)  SpO2: 95% 100% 87% 98%    Wt Readings from Last 3 Encounters:  05/27/15 63.912 kg (140 lb 14.4 oz)  05/23/15 61.576 kg (135 lb 12 oz)  04/09/15 59.33 kg (130 lb 12.8 oz)     Intake/Output Summary (Last 24 hours) at 05/27/15 1128 Last data filed at 05/27/15 0900  Gross per 24 hour  Intake    240 ml  Output    100 ml  Net    140 ml     Physical Exam  Awake Alert and awake Supple  Neck,No JVD, No cervical lymphadenopathy appreciated.  Symmetrical Chest wall movement, no wheezes S1 and S2 wnl, no rubs +ve B.Sounds, Abd Soft, No tenderness, No organomegaly appriciated, No rebound - guarding or rigidity. No Cyanosis, Clubbing      Data Review:    CBC  Recent Labs Lab 05/14/2015 2358 05/26/15 0454  WBC 10.3 16.1*  HGB 10.9* 11.0*  HCT 35.9* 36.6  PLT 199 179  MCV 80.9 83.0  MCH 24.5* 24.9*  MCHC 30.4 30.1  RDW 21.0* 20.0*    Chemistries   Recent Labs Lab 05/15/2015 2358 05/26/15 0454 05/26/15 0929 05/27/15 0021  NA 136 135 130* 129*  K 4.5 5.7* 5.7* 6.6*  CL 102 99* 98* 94*  CO2 23 25 19* 24  GLUCOSE 112* 78 106* 82  BUN 39* 37* 41* 48*  CREATININE 1.94* 2.12* 2.29* 2.80*  CALCIUM 9.2 9.3 9.0 9.3   ------------------------------------------------------------------------------------------------------------------ No results for input(s): CHOL, HDL, LDLCALC, TRIG, CHOLHDL, LDLDIRECT in the last 72 hours.  No results found for: HGBA1C ------------------------------------------------------------------------------------------------------------------ No results for input(s): TSH, T4TOTAL, T3FREE, THYROIDAB in the last 72 hours.  Invalid input(s): FREET3 ------------------------------------------------------------------------------------------------------------------ No results for input(s): VITAMINB12, FOLATE, FERRITIN, TIBC, IRON, RETICCTPCT in the last 72 hours.  Coagulation profile  Recent Labs Lab 05/13/2015 2358  INR 2.38*    No results for input(s): DDIMER in the last 72 hours.  Cardiac Enzymes  Recent Labs Lab 06/07/2015 2358 05/25/15 0944  TROPONINI 0.14* 0.17*   ------------------------------------------------------------------------------------------------------------------  Component Value Date/Time   BNP 1407.1* 05/16/2015 2358    Inpatient Medications  Scheduled Meds: . apixaban  2.5 mg Oral BID  . azithromycin  250  mg Intravenous Q24H  . cefTRIAXone (ROCEPHIN)  IV  2 g Intravenous Q24H  . ezetimibe  10 mg Oral Q M,W,F  . fluticasone  2 spray Each Nare Daily  . levothyroxine  100 mcg Oral QHS  . ondansetron (ZOFRAN) IV  4 mg Intravenous Once  . sodium chloride flush  3 mL Intravenous Q12H  . sodium chloride flush  3 mL Intravenous Q12H  . sodium polystyrene  15 g Oral NOW  . torsemide  40 mg Oral BID   Continuous Infusions: . DOBUTamine 4 mcg/kg/min (05/25/15 1144)   PRN Meds:.sodium chloride, acetaminophen, albuterol, guaiFENesin-dextromethorphan, senna-docusate, simethicone, sodium chloride flush, sodium chloride flush, triamcinolone cream  Micro Results Recent Results (from the past 240 hour(s))  Blood culture (routine x 2)     Status: None (Preliminary result)   Collection Time: 05/28/2015 11:58 PM  Result Value Ref Range Status   Specimen Description BLOOD LEFT ANTECUBITAL  Final   Special Requests   Final    BOTTLES DRAWN AEROBIC AND ANAEROBIC AER 5cc ANA 5cc   Culture   Final    NO GROWTH 1 DAY Performed at [facility]    Report Status PENDING  Incomplete  Blood culture (routine x 2)     Status: None (Preliminary result)   Collection Time: 05/20/2015 11:58 PM  Result Value Ref Range Status   Specimen Description BLOOD ARM LEFT  Final   Special Requests   Final    BOTTLES DRAWN AEROBIC AND ANAEROBIC AER 5cc ANA 5cc   Culture   Final    NO GROWTH 1 DAY Performed at [facility]    Report Status PENDING  Incomplete  Culture, sputum-assessment     Status: None   Collection Time: 05/25/15  4:08 PM  Result Value Ref Range Status   Specimen Description SPUTUM  Final   Special Requests NONE  Final   Sputum evaluation   Final    MICROSCOPIC FINDINGS SUGGEST THAT THIS SPECIMEN IS NOT REPRESENTATIVE OF LOWER RESPIRATORY SECRETIONS. PLEASE RECOLLECT. CALLED TO [facility] RN 05/25/15 1716 [place]    Report Status 05/25/2015 FINAL  Final    Radiology Reports Dg Chest 2  View  05/26/2015  CLINICAL DATA:  82 year old female with the with pleuritic chest pain. Evaluate for pneumonia. EXAM: CHEST  2 VIEW COMPARISON:  Chest x-ray 05/26/2015. FINDINGS: Persistent left lower lobe airspace consolidation, concerning for pneumonia. Moderate left pleural effusion. Small right pleural effusion, some of which is noted dependently, with some of apparently loculated in the posterior aspect of the right major fissure, similar to prior examinations. Cardiomegaly. Pulmonary venous congestion, without frank pulmonary edema. Upper mediastinal contours are within normal limits. Atherosclerosis in the thoracic aorta. Left-sided biventricular pacemaker/AICD with lead tips projecting over the expected location of the right atrium, right ventricular apex and overlying the lateral wall the left ventricle via the coronary sinus and coronary veins. Right upper extremity PICC with tip terminating at the superior cavoatrial junction. IMPRESSION: 1. Support apparatus, as above. 2. Left lower lobe pneumonia with moderate left parapneumonic pleural effusion. 3. Small right pleural effusion, partially loculated in the right major fissure, similar prior studies. 4. Cardiomegaly with pulmonary venous congestion. 5. Atherosclerosis. Electronically Signed   By: Vinnie Langton M.D.   On: 05/26/2015 12:46   Dg Chest 2 View  05/25/2015  CLINICAL DATA:  82 year old female with chest pain. EXAM: CHEST  2 VIEW COMPARISON:  Chest radiograph dated 03/12/2015 and CT dated 03/09/2015 FINDINGS: There is a focal wedge-shaped opacity in the superior segment of the right lower lobe which appears smaller compared to the prior radiograph. Left lung base atelectatic changes/scarring noted. A small right pleural effusion may be present. There is no pneumothorax. There is moderate cardiomegaly. Left pectoral AICD device. The right sided PICC with tip at the cavoatrial junction. No acute fracture. IMPRESSION: Interval decrease in  the size of the right lower lobe opacity. Moderate cardiomegaly. Electronically Signed   By: Anner Crete M.D.   On: 05/25/2015 01:27   Dg Chest Port 1 View  05/26/2015  CLINICAL DATA:  Hypoxia EXAM: PORTABLE CHEST 1 VIEW COMPARISON:  05/25/2015 FINDINGS: Cardiac enlargement with pulmonary vascular congestion which appears slightly progressive. Left lower lobe airspace disease has progressed. Small pleural effusions bilaterally. Improvement in right perihilar density. AICD unchanged in position. Right arm PICC tip in the right atrium unchanged. IMPRESSION: Progressive vascular congestion suggesting fluid overload Progressive of left lower lobe atelectasis/ infiltrate. Improvement in right perihilar density Electronically Signed   By: Franchot Gallo M.D.   On: 05/26/2015 10:38    Time Spent in minutes  25   Velvet Bathe M.D on 05/27/2015 at 11:28 AM  Between 7am to 7pm - Pager - [phone]  After 7pm go to [email or web address] - password [facility]  [facility] -  Office  [phone]

## 2015-05-27 NOTE — Progress Notes (Signed)
Pt refuses the second bottle of kayexalate. Stated that she cannot take it or she will throw up. Encourage en explain the necessity of taking it but she refuses.

## 2015-05-27 NOTE — Progress Notes (Signed)
[facility]  Patient Status:  Active pt with [facility] prior to this readmission  [facility] is providing the following services: [facility], [facility] [place] pharmacy team for home Dobutamine.   [facility] hospital team will follow and support transition back home when ordered.  If patient discharges after hours, please call [phone].   Larry Sierras 05/27/2015, 2:45 PM

## 2015-05-27 NOTE — Progress Notes (Signed)
Patient still complaining of left flank pain. No prns available. Tylenol is not controlling her pain. Have paged MD for a prn. Every dose ordered over past two days have been a one time prn dose order. Waiting for orders.  Sandra [name]

## 2015-05-28 DIAGNOSIS — R570 Cardiogenic shock: Secondary | ICD-10-CM

## 2015-05-28 LAB — HEPARIN LEVEL (UNFRACTIONATED): Heparin Unfractionated: >2.2 IU/mL — ABNORMAL HIGH (ref 0.30–0.70)

## 2015-05-28 LAB — BASIC METABOLIC PANEL
ANION GAP: 15 (ref 5–15)
BUN: 58 mg/dL — ABNORMAL HIGH (ref 6–20)
CO2: 23 mmol/L (ref 22–32)
Calcium: 9.2 mg/dL (ref 8.9–10.3)
Chloride: 91 mmol/L — ABNORMAL LOW (ref 101–111)
Creatinine, Ser: 2.92 mg/dL — ABNORMAL HIGH (ref 0.44–1.00)
GFR calc Af Amer: 16 mL/min — ABNORMAL LOW (ref >60)
GFR, EST NON AFRICAN AMERICAN: 14 mL/min — AB (ref >60)
Glucose, Bld: 79 mg/dL (ref 65–99)
POTASSIUM: 5.5 mmol/L — AB (ref 3.5–5.1)
SODIUM: 129 mmol/L — AB (ref 135–145)

## 2015-05-28 LAB — CBC
HCT: 34.8 % — ABNORMAL LOW (ref 36.0–46.0)
Hemoglobin: 10.3 g/dL — ABNORMAL LOW (ref 12.0–15.0)
MCH: 23.7 pg — AB (ref 26.0–34.0)
MCHC: 29.6 g/dL — AB (ref 30.0–36.0)
MCV: 80.2 fL (ref 78.0–100.0)
PLATELETS: 187 10*3/uL (ref 150–400)
RBC: 4.34 MIL/uL (ref 3.87–5.11)
RDW: 19.6 % — ABNORMAL HIGH (ref 11.5–15.5)
WBC: 13.8 10*3/uL — ABNORMAL HIGH (ref 4.0–10.5)

## 2015-05-28 LAB — CARBOXYHEMOGLOBIN
CARBOXYHEMOGLOBIN: 0.8 % (ref 0.5–1.5)
Carboxyhemoglobin: 0.8 % (ref 0.5–1.5)
METHEMOGLOBIN: 0.8 % (ref 0.0–1.5)
Methemoglobin: 0.8 % (ref 0.0–1.5)
O2 SAT: 22.6 %
O2 Saturation: 25 %
TOTAL HEMOGLOBIN: 11.2 g/dL — AB (ref 12.0–16.0)
Total hemoglobin: 11.2 g/dL — ABNORMAL LOW (ref 12.0–16.0)

## 2015-05-28 LAB — MRSA PCR SCREENING: MRSA by PCR: NEGATIVE

## 2015-05-28 LAB — APTT: APTT: 56 s — AB (ref 24–37)

## 2015-05-28 MED ORDER — NOREPINEPHRINE BITARTRATE 1 MG/ML IV SOLN
8.0000 ug/min | INTRAVENOUS | Status: DC
  Administered 2015-05-28: 4 ug/min via INTRAVENOUS
  Administered 2015-05-29 – 2015-05-30 (×5): 8 ug/min via INTRAVENOUS
  Filled 2015-05-28 (×6): qty 4

## 2015-05-28 MED ORDER — ALTEPLASE 2 MG IJ SOLR
2.0000 mg | Freq: Once | INTRAMUSCULAR | Status: DC
  Filled 2015-05-28: qty 2

## 2015-05-28 MED ORDER — PIPERACILLIN-TAZOBACTAM IN DEX 2-0.25 GM/50ML IV SOLN
2.2500 g | Freq: Four times a day (QID) | INTRAVENOUS | Status: DC
  Administered 2015-05-29 – 2015-05-31 (×10): 2.25 g via INTRAVENOUS
  Filled 2015-05-28 (×14): qty 50

## 2015-05-28 MED ORDER — ALTEPLASE 2 MG IJ SOLR: 2.0000 mg | Freq: Once | INTRAMUSCULAR | Status: DC

## 2015-05-28 MED ORDER — FUROSEMIDE 10 MG/ML IJ SOLN
80.0000 mg | Freq: Two times a day (BID) | INTRAMUSCULAR | Status: DC
  Administered 2015-05-28 – 2015-06-01 (×8): 80 mg via INTRAVENOUS
  Filled 2015-05-28 (×9): qty 8

## 2015-05-28 MED ORDER — SODIUM POLYSTYRENE SULFONATE 15 GM/60ML PO SUSP
15.0000 g | Freq: Once | ORAL | Status: DC
  Filled 2015-05-28: qty 60

## 2015-05-28 MED ORDER — ALTEPLASE 2 MG IJ SOLR
2.0000 mg | Freq: Once | INTRAMUSCULAR | Status: DC
  Filled 2015-05-28 (×2): qty 2

## 2015-05-28 MED ORDER — ALTEPLASE 2 MG IJ SOLR
2.0000 mg | Freq: Once | INTRAMUSCULAR | Status: AC
Start: 2015-05-28 — End: 2015-05-30
  Administered 2015-05-30: 2 mg

## 2015-05-28 MED ORDER — HEPARIN (PORCINE) IN NACL 100-0.45 UNIT/ML-% IJ SOLN
900.0000 [IU]/h | INTRAMUSCULAR | Status: DC
  Administered 2015-05-28: 900 [IU]/h via INTRAVENOUS
  Filled 2015-05-28: qty 250

## 2015-05-28 MED ORDER — ACETAMINOPHEN 500 MG PO TABS
1000.0000 mg | ORAL_TABLET | Freq: Four times a day (QID) | ORAL | Status: DC | PRN
  Administered 2015-05-28 – 2015-06-01 (×8): 1000 mg via ORAL
  Filled 2015-05-28 (×8): qty 2

## 2015-05-28 MED FILL — Fentanyl Citrate Preservative Free (PF) Inj 100 MCG/2ML: INTRAMUSCULAR | Qty: 2 | Status: AC

## 2015-05-28 NOTE — Progress Notes (Signed)
ANTICOAGULATION CONSULT NOTE - Initial Consult  Pharmacy Consult for heparin Indication: atrial fibrillation  Allergies  Allergen Reactions  . Levofloxacin Other (See Comments)    Due to cardiac  AF  . Procaine Hcl Other (See Comments)    REACTION: smothers  . Spironolactone Diarrhea    Abdominal pain  . Amiodarone Nausea And Vomiting  . Tape Other (See Comments)    Tears skin off.  Please use "paper" tape only  . Amoxicillin Other (See Comments)    Rash   . Codeine Other (See Comments)    REACTION: nausea  . Niacin Other (See Comments)    REACTION: rash  . Ramipril Other (See Comments)    unknown  . Statins Other (See Comments)    REACTION: muscle aches    Patient Measurements: Height: 5\' 3"  (160 cm) Weight: 140 lb 8 oz (63.73 kg) IBW/kg (Calculated) : 52.4 Heparin Dosing Weight: 63kg  Vital Signs: Temp: 97.5 F (36.4 C) (05/16 0303) Temp Source: Oral (05/16 0303) BP: 126/76 mmHg (05/16 0303) Pulse Rate: 70 (05/16 0303)  Labs:  Recent Labs  05/26/15 0454 05/26/15 0929 05/27/15 0021 05/28/15 0415  HGB 11.0*  --   --  10.3*  HCT 36.6  --   --  34.8*  PLT 179  --   --  187  CREATININE 2.12* 2.29* 2.80* 2.92*    Estimated Creatinine Clearance: 13.6 mL/min (by C-G formula based on Cr of 2.92).   Medical History: Past Medical History  Diagnosis Date  . Atrial fibrillation ([place]) permanent     on Tikosyn and Coumadin  . Hyperlipidemia   . Hypothyroidism   . Bradycardia   . Cardiomyopathy, hypertrophic nonobstructive (HCC)     ejection fraction 25%  . Anal fissure   . Adenomatous colon polyp   . Diverticulosis   . ICD-CRT     generator change 2013  . Systolic heart failure   . Hyperlipidemia   . Hypertension   . RBBB plus LA hemiblock   . Family history of adverse reaction to anesthesia     "middle daughter couldn't take Versed"  . CHF (congestive heart failure) ([place])   . Varicose veins   . Chronic bronchitis ([place])     "get it most q spring  and fall" (02/06/2015)  . Migraines     "stopped after I went thru the change"  . History of gout   . Situational anxiety   . Chronic renal insufficiency, stage III (moderate)     gd 3-4  . Basal cell carcinoma     "right foot; right leg" (02/06/2015)  . Aspiration pneumonia ([place]) 03/08/2015    Archie Endo 03/08/2015    Medications:  Infusions:  . DOBUTamine 4 mcg/kg/min (05/28/15 0258)  . heparin      Assessment: 9 yof previously on apixaban for afib but Scr has been steadily trending up. Will now transition to IV heparin. She received a dose of apixaban this morning so will plan to start heparin ~12 hours after her last dose. H/H slightly low and platelets are WNL. No bleeding noted.   Goal of Therapy:  Heparin level 0.3-0.7 units/ml aPTT 66-102 seconds Monitor platelets by anticoagulation protocol: Yes   Plan:  - Check a baseline aPTT and heparin level for reference - Start heparin gtt tonight at 900 units/hr at 10pm  - Check an 8 hour aPTT and heparin level - Daily heparin level, aPTT and CBC - F/u renal fxn and ability to change back to apixaban  Elgie Maziarz, Rande Lawman 05/28/2015,10:22 AM

## 2015-05-28 NOTE — Care Management Important Message (Signed)
Important Message  Patient Details  Name: Sandra [name] MRN: [id number] Date of Birth: [birth year]   Medicare Important Message Given:  Yes    Dawayne Patricia, RN 05/28/2015, 11:47 AM

## 2015-05-28 NOTE — Progress Notes (Addendum)
PT Cancellation Note  Patient Details Name: UNIQUIA GLIME MRN: [id number] DOB: 10/27/1932   Cancelled Treatment:    Reason Eval/Treat Not Completed: Pain limiting ability to participate.  Pt again c/o pain and declining PT at this time.  Will continue to attempt PT eval.     Mickel Schreur, Thornton Papas,  [phone] 05/28/2015, 10:47 AM

## 2015-05-28 NOTE — Progress Notes (Signed)
Pharmacy Antibiotic Note  Sandra [name] is a 82 y.o. female admitted on 06/01/2015 with aspiration pneumonia.  Pharmacy has been consulted for Zosyn dosing.  WBC 13.8, afebrile. CXR with lobar opacity. Has been on CTX & azithromycin, however now concern for aspiration pna and changing abx to Zosyn  Plan: Zosyn 2.25g IV q6h F/u cx, clinical s/sx improvement, duration of therapy  Height: 5\' 3"  (160 cm) Weight: 140 lb 8 oz (63.73 kg) IBW/kg (Calculated) : 52.4  Temp (24hrs), Avg:97.7 F (36.5 C), Min:97.5 F (36.4 C), Max:97.8 F (36.6 C)   Recent Labs Lab 05/26/2015 2358 05/25/15 0015 05/26/15 0454 05/26/15 0929 05/27/15 0021 05/28/15 0415  WBC 10.3  --  16.1*  --   --  13.8*  CREATININE 1.94*  --  2.12* 2.29* 2.80* 2.92*  LATICACIDVEN  --  1.41  --   --   --   --     Estimated Creatinine Clearance: 13.6 mL/min (by C-G formula based on Cr of 2.92).    Allergies  Allergen Reactions  . Levofloxacin Other (See Comments)    Due to cardiac  AF  . Procaine Hcl Other (See Comments)    REACTION: smothers  . Spironolactone Diarrhea    Abdominal pain  . Amiodarone Nausea And Vomiting  . Tape Other (See Comments)    Tears skin off.  Please use "paper" tape only  . Amoxicillin Other (See Comments)    Rash   . Codeine Other (See Comments)    REACTION: nausea  . Niacin Other (See Comments)    REACTION: rash  . Ramipril Other (See Comments)    unknown  . Statins Other (See Comments)    REACTION: muscle aches    Antimicrobials this admission: 5/13 azithro >> 5/16 5/13 CTX >> 5/16 5/16 Zosyn >>  Dose adjustments this admission: N/A  Microbiology results: 5/12 BCx: ng x 3 d 5/12 Sputum:  Will need to be recollected   Thank you for allowing [place] to participate in this patients care. Jens Som, PharmD Pager: [phone]  05/28/2015 5:26 PM

## 2015-05-28 NOTE — Progress Notes (Addendum)
PROGRESS NOTE                                                                                                                                                                                                             Patient Demographics:    Sandra [name], is a 82 y.o. female, DOB - [birth year], [id number]  Admit date - 05/23/2015   Admitting Physician Allie Bossier, MD  Outpatient Primary MD for the patient is Garret Reddish, MD  LOS - 3   Brief Narrative   82 y.o. WF past medical history that includes A. fib currently on our requests, nonobstructive cardiomyopathy status post pacemaker/AICD on a dobutamine drip, hyperlipidemia, hypertension presents to the emergency department with chief complaint of left-sided back pain while at rest. Initial evaluation in the emergency department reveals acute respiratory failure with hypoxia which may be related to pneumonia per chest x-ray.   Subjective:    Sandra [name] today has no new complaints. Breathing is minimally improved. She is reporting cough   Assessment  & Plan :    Principal Problem:   Acute respiratory failure with hypoxia ([name]) - mixed etiology most likely given history of HF.  - Continue abx - Appreciate cardiology's assistance pt being diuresed  Hyperkalemia - Potassium still elevated will administer Kayexalate again  Active Problems:   Hypothyroidism - Stable continue synthroid    CKD (chronic kidney disease), stage IV (HCC) - Stable. Will continue to monitor S creatinine    Biventricular implantable cardioverter-defibrillator -St. Jude   Chronic systolic heart failure [facility]) - May have acute on chronic exacerbation. Currently cardiology managing   Persistent atrial fibrillation (HCC)   Aspiration pneumonia (HCC)   Back pain   Chronic systolic CHF (congestive heart failure) (HCC)   NICM (nonischemic cardiomyopathy) ([place])   Other  specified hypothyroidism   CKD (chronic kidney disease), stage III   Code Status : full  Family Communication  : d/c patient and family  Disposition Plan  : pending improvement in condition  Consults  :  Cardiology  Procedures  : None  DVT Prophylaxis  :  On Eliquis  Lab Results  Component Value  Date   PLT 187 05/28/2015    Antibiotics  :   Azithromycin Ceftriaxone Anti-infectives    Start     Dose/Rate Route Frequency Ordered Stop   05/26/15 1000  cefTRIAXone (ROCEPHIN) 2 g in dextrose 5 % 50 mL IVPB     2 g 100 mL/hr over 30 Minutes Intravenous Every 24 hours 05/25/15 0836     05/25/15 1000  azithromycin (ZITHROMAX) 250 mg in dextrose 5 % 125 mL IVPB     250 mg 125 mL/hr over 60 Minutes Intravenous Every 24 hours 05/25/15 0804     05/25/15 0412  azithromycin (ZITHROMAX) 500 MG injection  Status:  Discontinued    Comments:  Collene Gobble   : cabinet override      05/25/15 0412 05/25/15 0815   05/25/15 0145  cefTRIAXone (ROCEPHIN) 1 g in dextrose 5 % 50 mL IVPB     1 g 100 mL/hr over 30 Minutes Intravenous  Once 05/25/15 0135 05/25/15 0420   05/25/15 0145  azithromycin (ZITHROMAX) 500 mg in dextrose 5 % 250 mL IVPB     500 mg 250 mL/hr over 60 Minutes Intravenous  Once 05/25/15 0135 05/25/15 0522        Objective:   Filed Vitals:   05/27/15 1357 05/27/15 2003 05/28/15 0303 05/28/15 1311  BP: 123/65 111/65 126/76 129/78  Pulse: 71 68 70 70  Temp: 97.8 F (36.6 C) 97.7 F (36.5 C) 97.5 F (36.4 C) 97.8 F (36.6 C)  TempSrc: Oral Oral Oral Oral  Resp: 17 18 20 17   Height:      Weight:   63.73 kg (140 lb 8 oz)   SpO2: 96% 98% 99% 99%    Wt Readings from Last 3 Encounters:  05/28/15 63.73 kg (140 lb 8 oz)  05/23/15 61.576 kg (135 lb 12 oz)  04/09/15 59.33 kg (130 lb 12.8 oz)     Intake/Output Summary (Last 24 hours) at 05/28/15 1546 Last data filed at 05/28/15 1300  Gross per 24 hour  Intake    400 ml  Output    650 ml  Net   -250 ml      Physical Exam  Awake Alert and awake Supple Neck,No JVD, No cervical lymphadenopathy appreciated.  Symmetrical Chest wall movement, no wheezes S1 and S2 wnl, no rubs +ve B.Sounds, Abd Soft, No tenderness, No organomegaly appriciated, No rebound - guarding or rigidity. No Cyanosis, Clubbing      Data Review:    CBC  Recent Labs Lab 05/30/2015 2358 05/26/15 0454 05/28/15 0415  WBC 10.3 16.1* 13.8*  HGB 10.9* 11.0* 10.3*  HCT 35.9* 36.6 34.8*  PLT 199 179 187  MCV 80.9 83.0 80.2  MCH 24.5* 24.9* 23.7*  MCHC 30.4 30.1 29.6*  RDW 21.0* 20.0* 19.6*    Chemistries   Recent Labs Lab 06/06/2015 2358 05/26/15 0454 05/26/15 0929 05/27/15 0021 05/27/15 1026 05/28/15 0415  NA 136 135 130* 129*  --  129*  K 4.5 5.7* 5.7* 6.6* 5.8* 5.5*  CL 102 99* 98* 94*  --  91*  CO2 23 25 19* 24  --  23  GLUCOSE 112* 78 106* 82  --  79  BUN 39* 37* 41* 48*  --  58*  CREATININE 1.94* 2.12* 2.29* 2.80*  --  2.92*  CALCIUM 9.2 9.3 9.0 9.3  --  9.2   ------------------------------------------------------------------------------------------------------------------ No results for input(s): CHOL, HDL, LDLCALC, TRIG, CHOLHDL, LDLDIRECT in the last 72 hours.  No results found for: HGBA1C ------------------------------------------------------------------------------------------------------------------  No results for input(s): TSH, T4TOTAL, T3FREE, THYROIDAB in the last 72 hours.  Invalid input(s): FREET3 ------------------------------------------------------------------------------------------------------------------ No results for input(s): VITAMINB12, FOLATE, FERRITIN, TIBC, IRON, RETICCTPCT in the last 72 hours.  Coagulation profile  Recent Labs Lab 05/17/2015 2358  INR 2.38*    No results for input(s): DDIMER in the last 72 hours.  Cardiac Enzymes  Recent Labs Lab 06/02/2015 2358 05/25/15 0944  TROPONINI 0.14* 0.17*    ------------------------------------------------------------------------------------------------------------------    Component Value Date/Time   BNP 1407.1* 05/26/2015 2358    Inpatient Medications  Scheduled Meds: . azithromycin  250 mg Intravenous Q24H  . cefTRIAXone (ROCEPHIN)  IV  2 g Intravenous Q24H  . ezetimibe  10 mg Oral Q M,W,F  . fluticasone  2 spray Each Nare Daily  . levothyroxine  100 mcg Oral QHS  . ondansetron (ZOFRAN) IV  4 mg Intravenous Once  . pneumococcal 23 valent vaccine  0.5 mL Intramuscular Tomorrow-1000  . sodium chloride flush  3 mL Intravenous Q12H  . sodium chloride flush  3 mL Intravenous Q12H   Continuous Infusions: . DOBUTamine 4 mcg/kg/min (05/28/15 0258)  . heparin     PRN Meds:.sodium chloride, acetaminophen, albuterol, guaiFENesin-dextromethorphan, senna-docusate, simethicone, sodium chloride flush, sodium chloride flush, triamcinolone cream  Micro Results Recent Results (from the past 240 hour(s))  Blood culture (routine x 2)     Status: None (Preliminary result)   Collection Time: 06/10/2015 11:58 PM  Result Value Ref Range Status   Specimen Description BLOOD LEFT ANTECUBITAL  Final   Special Requests   Final    BOTTLES DRAWN AEROBIC AND ANAEROBIC AER 5cc ANA 5cc   Culture   Final    NO GROWTH 3 DAYS Performed at [facility]    Report Status PENDING  Incomplete  Blood culture (routine x 2)     Status: None (Preliminary result)   Collection Time: 05/25/2015 11:58 PM  Result Value Ref Range Status   Specimen Description BLOOD ARM LEFT  Final   Special Requests   Final    BOTTLES DRAWN AEROBIC AND ANAEROBIC AER 5cc ANA 5cc   Culture   Final    NO GROWTH 3 DAYS Performed at [facility]    Report Status PENDING  Incomplete  Culture, sputum-assessment     Status: None   Collection Time: 05/25/15  4:08 PM  Result Value Ref Range Status   Specimen Description SPUTUM  Final   Special Requests NONE  Final   Sputum  evaluation   Final    MICROSCOPIC FINDINGS SUGGEST THAT THIS SPECIMEN IS NOT REPRESENTATIVE OF LOWER RESPIRATORY SECRETIONS. PLEASE RECOLLECT. CALLED TO [facility] RN 05/25/15 1716 Nitro    Report Status 05/25/2015 FINAL  Final    Radiology Reports Dg Chest 2 View  05/26/2015  CLINICAL DATA:  82 year old female with the with pleuritic chest pain. Evaluate for pneumonia. EXAM: CHEST  2 VIEW COMPARISON:  Chest x-ray 05/26/2015. FINDINGS: Persistent left lower lobe airspace consolidation, concerning for pneumonia. Moderate left pleural effusion. Small right pleural effusion, some of which is noted dependently, with some of apparently loculated in the posterior aspect of the right major fissure, similar to prior examinations. Cardiomegaly. Pulmonary venous congestion, without frank pulmonary edema. Upper mediastinal contours are within normal limits. Atherosclerosis in the thoracic aorta. Left-sided biventricular pacemaker/AICD with lead tips projecting over the expected location of the right atrium, right ventricular apex and overlying the lateral wall the left ventricle via the coronary sinus and coronary veins. Right upper extremity  PICC with tip terminating at the superior cavoatrial junction. IMPRESSION: 1. Support apparatus, as above. 2. Left lower lobe pneumonia with moderate left parapneumonic pleural effusion. 3. Small right pleural effusion, partially loculated in the right major fissure, similar prior studies. 4. Cardiomegaly with pulmonary venous congestion. 5. Atherosclerosis. Electronically Signed   By: Vinnie Langton M.D.   On: 05/26/2015 12:46   Dg Chest 2 View  05/25/2015  CLINICAL DATA:  82 year old female with chest pain. EXAM: CHEST  2 VIEW COMPARISON:  Chest radiograph dated 03/12/2015 and CT dated 03/09/2015 FINDINGS: There is a focal wedge-shaped opacity in the superior segment of the right lower lobe which appears smaller compared to the prior radiograph. Left lung base atelectatic  changes/scarring noted. A small right pleural effusion may be present. There is no pneumothorax. There is moderate cardiomegaly. Left pectoral AICD device. The right sided PICC with tip at the cavoatrial junction. No acute fracture. IMPRESSION: Interval decrease in the size of the right lower lobe opacity. Moderate cardiomegaly. Electronically Signed   By: Anner Crete M.D.   On: 05/25/2015 01:27   Dg Chest Port 1 View  05/26/2015  CLINICAL DATA:  Hypoxia EXAM: PORTABLE CHEST 1 VIEW COMPARISON:  05/25/2015 FINDINGS: Cardiac enlargement with pulmonary vascular congestion which appears slightly progressive. Left lower lobe airspace disease has progressed. Small pleural effusions bilaterally. Improvement in right perihilar density. AICD unchanged in position. Right arm PICC tip in the right atrium unchanged. IMPRESSION: Progressive vascular congestion suggesting fluid overload Progressive of left lower lobe atelectasis/ infiltrate. Improvement in right perihilar density Electronically Signed   By: Franchot Gallo M.D.   On: 05/26/2015 10:38    Time Spent in minutes  30   Velvet Bathe M.D on 05/28/2015 at 3:46 PM  Between 7am to 7pm - Pager - [phone]  After 7pm go to [email or web address] - password [email or web address]  [facility] -  Office  [phone]   Addendum: There is concern for aspiration. Will change abx coverage to Zosyn

## 2015-05-28 NOTE — Consult Note (Addendum)
Advanced Heart Failure Team Consult Note   Referring Physician: Wendee [name]  Reason for Admission: PNA, respiratory failute   HPI:     Sandra [name] is a 82 y.o. female with h/o PAF, hypothyroidism, St Jude CRT-D CKD stage 4 and chronic systolic HF due to NICM with EF 25%. Tikosyn previously held with worsening renal function.   Admitted 10/29/14 after Sandra [name] with cardiogenic shock. Functional decline correlated with recurrence of A fib. Placed on milrinone and diuresed with IV lasix. Due to persistent low output she was switched from milrinone to dobutamine with improved hemodynamics. Dobutamine was unable to be weaned. Continued on dobutamine 5 mcg via [facility]HC. No ace or bb with low output/CKD.Underwent AVN ablation on 02/06/15.  Admitted 03/08/15 with a bad aspiration pneumonia.   I saw her in clinic in 5/11 and feeling relatively well. dobutamine decreased to 56mcg/kg/min. Admitted 5/12 with pleuritic CP, fever and productive cough. CXR with LLL PNA. WBC 10.3. Started on azithro and ceftriazone.   Remains on torsemide. Creatinine 1.9->2.1-> 2.9  She feels weak. C/o back pain. Mildly confused. Skin warm. UA negative    Review of Systems: [y] = yes, [ ]  = no   General: Weight gain [ ] ; Weight loss [ ] ; Anorexia [ ] ; Fatigue Sandra [name] ]; Fever Sandra [name] ]; Chills [ ] ; Weakness Sandra [name] ]  Cardiac: Chest pain/pressure [ y]; Resting SOB Sandra [name] ]; Exertional SOB Sandra [name] ]; Orthopnea [ ] ; Pedal Edema [ ] ; Palpitations [ ] ; Syncope [ ] ; Presyncope [ ] ; Paroxysmal nocturnal dyspnea[ ]   Pulmonary: Cough [ y]; Wheezing[ ] ; Hemoptysis[ ] ; Sputum Sandra [name] ]; Snoring [ ]   GI: Vomiting[ ] ; Dysphagia[ ] ; Melena[ ] ; Hematochezia [ ] ; Heartburn[ ] ; Abdominal pain [ ] ; Constipation [ ] ; Diarrhea [ ] ; BRBPR [ ]   GU: Hematuria[ ] ; Dysuria [ ] ; Nocturia[ ]   Vascular: Pain in legs with walking [ ] ; Pain in feet with lying flat [ ] ; Non-healing sores [ ] ; Stroke [ ] ; TIA [ ] ; Slurred speech [ ] ;  Neuro: Headaches[ ] ; Vertigo[ ] ; Seizures[ ] ;  Paresthesias[ ] ;Blurred vision [ ] ; Diplopia [ ] ; Vision changes [ ]   Ortho/Skin: Arthritis Sandra [name] ]; Joint pain Sandra [name] ]; Muscle pain [ y]; Joint swelling [ ] ; Back Pain [ ] ; Rash [ ]   Psych: Depression[ ] ; Anxiety[ ]   Heme: Bleeding problems [ ] ; Clotting disorders [ ] ; Anemia [ ]   Endocrine: Diabetes [ ] ; Thyroid dysfunction[ ]    Past Medical History: Past Medical History  Diagnosis Date  . Atrial fibrillation (Sandra [name]) permanent     on Tikosyn and Coumadin  . Hyperlipidemia   . Hypothyroidism   . Bradycardia   . Cardiomyopathy, hypertrophic nonobstructive (Sandra [name])     ejection fraction 25%  . Anal fissure   . Adenomatous colon polyp   . Diverticulosis   . ICD-CRT     generator change 2013  . Systolic heart failure   . Hyperlipidemia   . Hypertension   . RBBB plus LA hemiblock   . Family history of adverse reaction to anesthesia     "middle daughter couldn't take Versed"  . CHF (congestive heart failure) (Sandra [name])   . Varicose veins   . Chronic bronchitis (Sandra [name])     "get it most q spring and fall" (02/06/2015)  . Migraines     "stopped after I went thru the change"  . History of gout   . Situational anxiety   . Chronic renal insufficiency, stage III (moderate)     gd  3-4  . Basal cell carcinoma     "right foot; right leg" (02/06/2015)  . Aspiration pneumonia (Sandra [name]) 03/08/2015    Sandra [name] 03/08/2015    Past Surgical History: Past Surgical History  Procedure Laterality Date  . Cardioversion    . Cardiac defibrillator placement  2007  . Vaginal hysterectomy      uterus alone  . Ovary surgery  1989    "tumor on one of my ovaries; benign  . Biv icd genertaor change out N/A 03/23/2011    Procedure: BIV ICD GENERTAOR CHANGE OUT;  Surgeon: Sandra Sprang, MD;  Location: [facility] CATH LAB;  Service: Cardiovascular;  Laterality: N/A;  . Colonoscopy    . Cardiac catheterization N/A 10/29/2014    Procedure: Right Heart Cath;  Surgeon: Sandra Artist, MD;  Location: [place] CV LAB;  Service:  Cardiovascular;  Laterality: N/A;  . Cardioversion N/A 11/09/2014    Procedure: CARDIOVERSION;  Surgeon: Sandra Artist, MD;  Location: [facility] ENDOSCOPY;  Service: Cardiovascular;  Laterality: N/A;  . Av node ablation  02/06/2015  . Cataract extraction w/ intraocular lens  implant, bilateral Bilateral 2000s  . Colonoscopy    . Picc line place peripheral ([facility] hx) Right 10/2014    arm  . Electrophysiologic study N/A 02/06/2015    Procedure: AV Node Ablation;  Surgeon: Sandra Sprang, MD;  Location: [place] CV LAB;  Service: Cardiovascular;  Laterality: N/A;    Family History: Family History  Problem Relation Age of Onset  . Heart disease Mother   . Colon cancer Father   . COPD Father   . Heart disease Father   . Cardiomyopathy Daughter   . Breast cancer Paternal Aunt   . Colon polyps Father   . Diabetes Neg Hx   . Kidney disease Neg Hx     Social History: Social History   Social History  . Marital Status: Married    Spouse Name: N/A  . Number of Children: 3  . Years of Education: N/A   Occupational History  . Retired     Probation officer   Social History Main Topics  . Smoking status: Former Smoker -- 0.20 packs/day for 19 years    Types: Cigarettes    Quit date: 01/13/1987  . Smokeless tobacco: Never Used  . Alcohol Use: No  . Drug Use: No  . Sexual Activity: No   Other Topics Concern  . None   Social History Narrative   Married 1953 (husband Sandra [name] in our Network engineer). 3 girls (1 is a patient here-Sandra [name]). 2 grandkids.       Retired in [place]: very active, going to church and church friends, movies   Meds:  . azithromycin  250 mg Intravenous Q24H  . cefTRIAXone (ROCEPHIN)  IV  2 g Intravenous Q24H  . ezetimibe  10 mg Oral Q M,W,F  . fluticasone  2 spray Each Nare Daily  . levothyroxine  100 mcg Oral QHS  . ondansetron (ZOFRAN) IV  4 mg Intravenous Once  . pneumococcal 23 valent vaccine  0.5 mL Intramuscular Tomorrow-1000  .  sodium chloride flush  3 mL Intravenous Q12H  . sodium chloride flush  3 mL Intravenous Q12H  . sodium polystyrene  15 g Oral Once    Allergies:  Allergies  Allergen Reactions  . Levofloxacin Other (See Comments)    Due to cardiac  AF  . Procaine Hcl Other (See Comments)    REACTION: smothers  .  Spironolactone Diarrhea    Abdominal pain  . Amiodarone Nausea And Vomiting  . Tape Other (See Comments)    Tears skin off.  Please use "paper" tape only  . Amoxicillin Other (See Comments)    Rash   . Codeine Other (See Comments)    REACTION: nausea  . Niacin Other (See Comments)    REACTION: rash  . Ramipril Other (See Comments)    unknown  . Statins Other (See Comments)    REACTION: muscle aches    Objective:    Vital Signs:   Temp:  [97.5 F (36.4 C)-97.8 F (36.6 C)] 97.8 F (36.6 C) (05/16 1311) Pulse Rate:  [68-70] 70 (05/16 1311) Resp:  [17-20] 17 (05/16 1311) BP: (111-129)/(65-78) 129/78 mmHg (05/16 1311) SpO2:  [98 %-99 %] 99 % (05/16 1311) Weight:  [63.73 kg (140 lb 8 oz)] 63.73 kg (140 lb 8 oz) (05/16 0303) Last BM Date: 05/27/15 Filed Weights   05/26/15 0438 05/27/15 0623 05/28/15 0303  Weight: 62 kg (136 lb 11 oz) 63.912 kg (140 lb 14.4 oz) 63.73 kg (140 lb 8 oz)    Physical Exam: General: Lying in bed. Weak. Moaning. Confused at times. Skin warm. HEENT: normal Neck: supple. JVP 9. Carotids 2+ bilat; no bruits. No thyromegaly or nodule noted. Cor: PMI laterally displaced. regular Lungs: Crackles at bases Abdomen: soft, mildly distended, , no HSM. No bruits or masses. +BS  Extremities: no cyanosis, clubbing, rash, no edema. Multiple tophi bilateral hands. RUE PICC Neuro: alert & oriented x 3, mildly confused. cranial nerves grossly intact. moves all 4 extremities w/o difficulty. Skin: no rash.  Telemetry: AF with v-pacing  Labs: Basic Metabolic Panel:  Recent Labs Lab 05/23/2015 2358 05/26/15 0454 05/26/15 0929 05/27/15 0021 05/27/15 1026  05/28/15 0415  NA 136 135 130* 129*  --  129*  K 4.5 5.7* 5.7* 6.6* 5.8* 5.5*  CL 102 99* 98* 94*  --  91*  CO2 23 25 19* 24  --  23  GLUCOSE 112* 78 106* 82  --  79  BUN 39* 37* 41* 48*  --  58*  CREATININE 1.94* 2.12* 2.29* 2.80*  --  2.92*  CALCIUM 9.2 9.3 9.0 9.3  --  9.2    Liver Function Tests: No results for input(s): AST, ALT, ALKPHOS, BILITOT, PROT, ALBUMIN in the last 168 hours. No results for input(s): LIPASE, AMYLASE in the last 168 hours. No results for input(s): AMMONIA in the last 168 hours.  CBC:  Recent Labs Lab 05/26/2015 2358 05/26/15 0454 05/28/15 0415  WBC 10.3 16.1* 13.8*  HGB 10.9* 11.0* 10.3*  HCT 35.9* 36.6 34.8*  MCV 80.9 83.0 80.2  PLT 199 179 187    Cardiac Enzymes:  Recent Labs Lab 06/11/2015 2358 05/25/15 0944  TROPONINI 0.14* 0.17*    BNP: BNP (last 3 results)  Recent Labs  02/25/15 1115 03/08/15 0120 05/26/2015 2358  BNP 1364.1* 1414.9* 1407.1*    ProBNP (last 3 results) No results for input(s): PROBNP in the last 8760 hours.   CBG: No results for input(s): GLUCAP in the last 168 hours.  Coagulation Studies: No results for input(s): LABPROT, INR in the last 72 hours.  Other results:   Imaging:  No results found.      Assessment:   1. Acute Respiratory Failure with hypoxia 2. LLL Pneumonia 3. Chronic Systolic HF on home dobutamine 4. Chronic A fib 5. A/C CKD, stage IV 6. Hyperkalemia  7. Severe back pain  Plan/Discussion:  I suspect main issue is PNA but also seem to have a degree of HF. Given worsening renal function and recent decrease in dobutamine, I worry about possible recurrent low output. Will stop torsemide and check co-ox to make sure cardiac output is adequate. As she may have aspiration PNA might consider expanding abx coverage. I will discuss with Dr. Wendee [name]. She is quite tenuous and may need to be moved to SDU soon.   If back pain persists may need work-up for diskitis or other sources.    She is on heparin now with renal failure. Once creatinien begins to improve would resume Eliquis. Has received Kayexalate for hyperkalemia.    Length of Stay: 3 Glori Bickers MD 05/28/2015, 3:49 PM  Advanced Heart Failure Team Pager [phone] (M-F; 7a - 4p)  Please contact [facility] for night-coverage after hours (4p -7a ) and weekends on [email or web address]   Addendum:  Co-ox is 25% consistent with profound cardiogenic shock. Discussed with patient, family and Dr. Wendee [name]. Will move to ICU. Start levophed on top of dobutamine. They understand long-term options are quite limited with advanced HF despite dobutamine and multisystem organ failure. Will expand abx coverage to cover possible aspiration PNA.  The patient is critically ill with multiple organ systems failure and requires high complexity decision making for assessment and support.  Critical Care Time devoted to patient care services described in this note in addition to previous consult note is 40 Minutes.  Kennedy Brines,MD 5:43 PM

## 2015-05-29 LAB — CARBOXYHEMOGLOBIN
CARBOXYHEMOGLOBIN: 1.3 % (ref 0.5–1.5)
Carboxyhemoglobin: 1.1 % (ref 0.5–1.5)
Carboxyhemoglobin: 0.8 % (ref 0.5–1.5)
Carboxyhemoglobin: 1.8 % — ABNORMAL HIGH (ref 0.5–1.5)
Carboxyhemoglobin: 1.4 % (ref 0.5–1.5)
METHEMOGLOBIN: 0.8 % (ref 0.0–1.5)
METHEMOGLOBIN: 0.6 % (ref 0.0–1.5)
Methemoglobin: 0.7 % (ref 0.0–1.5)
Methemoglobin: 0.6 % (ref 0.0–1.5)
Methemoglobin: 0.7 % (ref 0.0–1.5)
O2 SAT: 51.9 %
O2 SAT: 32.1 %
O2 Saturation: 24.2 %
O2 Saturation: 68.6 %
O2 Saturation: 36.5 %
TOTAL HEMOGLOBIN: 11.1 g/dL — AB (ref 12.0–16.0)
TOTAL HEMOGLOBIN: 10.6 g/dL — AB (ref 12.0–16.0)
Total hemoglobin: 10.6 g/dL — ABNORMAL LOW (ref 12.0–16.0)
Total hemoglobin: 10.7 g/dL — ABNORMAL LOW (ref 12.0–16.0)
Total hemoglobin: 10.9 g/dL — ABNORMAL LOW (ref 12.0–16.0)

## 2015-05-29 LAB — CBC
HCT: 33.7 % — ABNORMAL LOW (ref 36.0–46.0)
Hemoglobin: 10.3 g/dL — ABNORMAL LOW (ref 12.0–15.0)
MCH: 24.1 pg — AB (ref 26.0–34.0)
MCHC: 30.6 g/dL (ref 30.0–36.0)
MCV: 78.7 fL (ref 78.0–100.0)
PLATELETS: 227 10*3/uL (ref 150–400)
RBC: 4.28 MIL/uL (ref 3.87–5.11)
RDW: 19.5 % — ABNORMAL HIGH (ref 11.5–15.5)
WBC: 12.2 10*3/uL — ABNORMAL HIGH (ref 4.0–10.5)

## 2015-05-29 LAB — HEPARIN LEVEL (UNFRACTIONATED)

## 2015-05-29 LAB — BASIC METABOLIC PANEL
ANION GAP: 12 (ref 5–15)
BUN: 63 mg/dL — AB (ref 6–20)
CALCIUM: 9 mg/dL (ref 8.9–10.3)
CO2: 24 mmol/L (ref 22–32)
Chloride: 92 mmol/L — ABNORMAL LOW (ref 101–111)
Creatinine, Ser: 2.63 mg/dL — ABNORMAL HIGH (ref 0.44–1.00)
GFR calc Af Amer: 19 mL/min — ABNORMAL LOW (ref >60)
GFR, EST NON AFRICAN AMERICAN: 16 mL/min — AB (ref >60)
Glucose, Bld: 92 mg/dL (ref 65–99)
POTASSIUM: 5.8 mmol/L — AB (ref 3.5–5.1)
SODIUM: 128 mmol/L — AB (ref 135–145)

## 2015-05-29 LAB — APTT: aPTT: 72 seconds — ABNORMAL HIGH (ref 24–37)

## 2015-05-29 MED ORDER — ONDANSETRON HCL 4 MG/2ML IJ SOLN
4.0000 mg | INTRAMUSCULAR | Status: DC | PRN
Start: 2015-05-29 — End: 2015-06-03
  Administered 2015-05-29: 4 mg via INTRAVENOUS
  Filled 2015-05-29: qty 2

## 2015-05-29 MED ORDER — FUROSEMIDE 10 MG/ML IJ SOLN
80.0000 mg | Freq: Once | INTRAMUSCULAR | Status: AC
Start: 2015-05-29 — End: 2015-05-29
  Administered 2015-05-29: 80 mg via INTRAVENOUS

## 2015-05-29 MED ORDER — MORPHINE SULFATE (PF) 2 MG/ML IV SOLN
1.0000 mg | INTRAVENOUS | Status: DC | PRN
  Administered 2015-06-02 (×2): 1 mg via INTRAVENOUS
  Filled 2015-05-29 (×2): qty 1

## 2015-05-29 NOTE — Progress Notes (Signed)
[place] for heparin Indication: atrial fibrillation  Allergies  Allergen Reactions  . Levofloxacin Other (See Comments)    Due to cardiac  AF  . Procaine Hcl Other (See Comments)    REACTION: smothers  . Spironolactone Diarrhea    Abdominal pain  . Amiodarone Nausea And Vomiting  . Tape Other (See Comments)    Tears skin off.  Please use "paper" tape only  . Amoxicillin Other (See Comments)    Rash   . Codeine Other (See Comments)    REACTION: nausea  . Niacin Other (See Comments)    REACTION: rash  . Ramipril Other (See Comments)    unknown  . Statins Other (See Comments)    REACTION: muscle aches    Patient Measurements: Height: 5\' 3"  (160 cm) Weight: 146 lb 9.7 oz (66.5 kg) IBW/kg (Calculated) : 52.4 Heparin Dosing Weight: 63kg  Vital Signs: Temp: 97.5 F (36.4 C) (05/17 0330) Temp Source: Oral (05/17 0330) BP: 141/92 mmHg (05/17 0600) Pulse Rate: 68 (05/17 0600)  Labs:  Recent Labs  05/27/15 0021 05/28/15 0415 05/28/15 1350 05/29/15 0534  HGB  --  10.3*  --  10.3*  HCT  --  34.8*  --  33.7*  PLT  --  187  --  227  APTT  --   --  56* 72*  HEPARINUNFRC  --   --  >2.20* >2.20*  CREATININE 2.80* 2.92*  --  2.63*    Estimated Creatinine Clearance: 15.4 mL/min (by C-G formula based on Cr of 2.63).  Assessment: 82 y.o. female with h/o Afib, Eliquis on hold, for heparin.  Initial aPTT within therapeutic range  Goal of Therapy:  Heparin level 0.3-0.7 units/ml aPTT 66-102 seconds Monitor platelets by anticoagulation protocol: Yes   Plan:  Continue Heparin at current rate  Recheck level later today to verify   Sandra [name] 05/29/2015,6:23 AM

## 2015-05-29 NOTE — Progress Notes (Addendum)
Tachy therapies on ICD deactivated by me after discussion with family and patient.  Report in paper chart.   Chanetta Marshall, NP 05/29/2015 3:07 PM

## 2015-05-29 NOTE — Progress Notes (Signed)
Advanced Heart Failure Rounding Note   Subjective:    She was moved to ICU yesterday for profound cardiogenic shock with co-ox 25 and CVP 20.   Now on norepi 10 and dobutamine 7.5. Feels better but co-ox remains ~30%.     Objective:   Weight Range:  Vital Signs:   Temp:  [97.4 F (36.3 C)-98.2 F (36.8 C)] 97.8 F (36.6 C) (05/17 1219) Pulse Rate:  [67-85] 69 (05/17 1200) Resp:  [12-32] 21 (05/17 1200) BP: (105-154)/(76-103) 129/79 mmHg (05/17 1200) SpO2:  [95 %-100 %] 100 % (05/17 1200) Weight:  [66.5 kg (146 lb 9.7 oz)] 66.5 kg (146 lb 9.7 oz) (05/17 0500) Last BM Date: 05/27/15  Weight change: Filed Weights   05/27/15 0623 05/28/15 0303 05/29/15 0500  Weight: 63.912 kg (140 lb 14.4 oz) 63.73 kg (140 lb 8 oz) 66.5 kg (146 lb 9.7 oz)    Intake/Output:   Intake/Output Summary (Last 24 hours) at 05/29/15 1330 Last data filed at 05/29/15 1100  Gross per 24 hour  Intake 1272.53 ml  Output   1380 ml  Net -107.47 ml     Physical Exam: General: Lying in bed. Weak.  HEENT: normal Neck: supple. JVP ear  Carotids 2+ bilat; no bruits. No thyromegaly or nodule noted. Cor: PMI laterally displaced. regular Lungs: Crackles at bases Abdomen: soft, mildly distended, , no HSM. No bruits or masses. +BS  Extremities: no cyanosis, clubbing, rash, no edema. Multiple tophi bilateral hands. RUE PICC Neuro: alert & oriented x 3, mildly confused. cranial nerves grossly intact. moves all 4 extremities w/o difficulty. Skin: no rash.  Telemetry: AF with v pacing  Labs: Basic Metabolic Panel:  Recent Labs Lab 05/26/15 0454 05/26/15 0929 05/27/15 0021 05/27/15 1026 05/28/15 0415 05/29/15 0534  NA 135 130* 129*  --  129* 128*  K 5.7* 5.7* 6.6* 5.8* 5.5* 5.8*  CL 99* 98* 94*  --  91* 92*  CO2 25 19* 24  --  23 24  GLUCOSE 78 106* 82  --  79 92  BUN 37* 41* 48*  --  58* 63*  CREATININE 2.12* 2.29* 2.80*  --  2.92* 2.63*  CALCIUM 9.3 9.0 9.3  --  9.2 9.0    Liver  Function Tests: No results for input(s): AST, ALT, ALKPHOS, BILITOT, PROT, ALBUMIN in the last 168 hours. No results for input(s): LIPASE, AMYLASE in the last 168 hours. No results for input(s): AMMONIA in the last 168 hours.  CBC:  Recent Labs Lab 06/02/2015 2358 05/26/15 0454 05/28/15 0415 05/29/15 0534  WBC 10.3 16.1* 13.8* 12.2*  HGB 10.9* 11.0* 10.3* 10.3*  HCT 35.9* 36.6 34.8* 33.7*  MCV 80.9 83.0 80.2 78.7  PLT 199 179 187 227    Cardiac Enzymes:  Recent Labs Lab 06/11/2015 2358 05/25/15 0944  TROPONINI 0.14* 0.17*    BNP: BNP (last 3 results)  Recent Labs  02/25/15 1115 03/08/15 0120 05/28/2015 2358  BNP 1364.1* 1414.9* 1407.1*    ProBNP (last 3 results) No results for input(s): PROBNP in the last 8760 hours.    Other results:  Imaging:  No results found.   Medications:     Scheduled Medications: . alteplase  2 mg Intracatheter Once  . alteplase  2 mg Intracatheter Once  . alteplase  2 mg Intracatheter Once  . alteplase  2 mg Intracatheter Once  . ezetimibe  10 mg Oral Q M,W,F  . fluticasone  2 spray Each Nare Daily  . furosemide  80 mg Intravenous BID  . levothyroxine  100 mcg Oral QHS  . ondansetron (ZOFRAN) IV  4 mg Intravenous Once  . piperacillin-tazobactam (ZOSYN)  IV  2.25 g Intravenous Q6H  . pneumococcal 23 valent vaccine  0.5 mL Intramuscular Tomorrow-1000  . sodium chloride flush  3 mL Intravenous Q12H  . sodium chloride flush  3 mL Intravenous Q12H  . sodium polystyrene  15 g Oral Once     Infusions: . DOBUTamine 7.5 mcg/kg/min (05/29/15 1100)  . heparin 900 Units/hr (05/29/15 1100)  . norepinephrine (LEVOPHED) Adult infusion 8 mcg/min (05/29/15 1100)     PRN Medications:  sodium chloride, acetaminophen, albuterol, guaiFENesin-dextromethorphan, morphine injection, ondansetron (ZOFRAN) IV, senna-docusate, simethicone, sodium chloride flush, sodium chloride flush, triamcinolone cream   Assessment:   1. Cardiogenic  shock 2. Acute Respiratory Failure with hypoxia  3. Acute on Chronic Systolic HF on home dobutamine 4. Chronic A fib 5. A/C CKD, stage IV 6. Hyperkalemia     Plan/Discussion:    She is actively dying from end-stage HF with poor response to dual inotropes. She is not candidate for mechanical support.   I discussed options with her and her family:  1) Continue supportive care with dual inotropes and iv diuresis for now with no escalation of care 2) Transition to comfort care  They have opted for #1 but realize that she may continue to get worse throughout the day. We have made her DNR/DNI and will deactivate her ICD (keep pacer going).   Unfortunately I think she will likely pass in next 24-48 hours.   The patient is critically ill with multiple organ systems failure and requires high complexity decision making for assessment and support, frequent evaluation and titration of therapies, application of advanced monitoring technologies and extensive interpretation of multiple databases.   Critical Care Time devoted to patient care services described in this note is 35 Minutes.   Length of Stay: 4   Cree Napoli MD 05/29/2015, 1:30 PM  Advanced Heart Failure Team Pager [phone] (M-F; [place])  Please contact [facility] for night-coverage after hours (4p -7a ) and weekends on [email or web address]

## 2015-05-29 NOTE — Progress Notes (Signed)
EP NP at bedside to turn off ICD.

## 2015-05-29 NOTE — Progress Notes (Signed)
PT Cancellation Note  Patient Details Name: Sandra [name] MRN: [id number] DOB: 18-May-1932   Cancelled Treatment:    Reason Eval/Treat Not Completed: Other (comment).  Per conversation w/ RN, pt may benefit from hospice/palliative consult and RN requests to hold PT until MD and pt/pt's family makes this decision.  PT will continue to follow acutely and will see when/if appropriate.  Collie Siad PT, DPT  Pager: [phone] Phone: [phone] 05/29/2015, 9:45 AM

## 2015-05-29 NOTE — Care Management Important Message (Signed)
Important Message  Patient Details  Name: Sandra [name] MRN: [id number] Date of Birth: 1932-01-22   Medicare Important Message Given:  Yes    Lacretia Leigh, RN 05/29/2015, 10:22 AM

## 2015-05-30 LAB — CARBOXYHEMOGLOBIN
Carboxyhemoglobin: 1.2 % (ref 0.5–1.5)
Methemoglobin: 0.6 % (ref 0.0–1.5)
O2 Saturation: 32.2 %
Total hemoglobin: 12.5 g/dL (ref 12.0–16.0)

## 2015-05-30 LAB — CBC
HEMATOCRIT: 36.9 % (ref 36.0–46.0)
Hemoglobin: 11.3 g/dL — ABNORMAL LOW (ref 12.0–15.0)
MCH: 23.9 pg — AB (ref 26.0–34.0)
MCHC: 30.6 g/dL (ref 30.0–36.0)
MCV: 78.2 fL (ref 78.0–100.0)
Platelets: 190 10*3/uL (ref 150–400)
RBC: 4.72 MIL/uL (ref 3.87–5.11)
RDW: 19.3 % — AB (ref 11.5–15.5)
WBC: 10.3 10*3/uL (ref 4.0–10.5)

## 2015-05-30 LAB — BASIC METABOLIC PANEL
ANION GAP: 15 (ref 5–15)
BUN: 56 mg/dL — ABNORMAL HIGH (ref 6–20)
CHLORIDE: 93 mmol/L — AB (ref 101–111)
CO2: 23 mmol/L (ref 22–32)
Calcium: 8.5 mg/dL — ABNORMAL LOW (ref 8.9–10.3)
Creatinine, Ser: 2.31 mg/dL — ABNORMAL HIGH (ref 0.44–1.00)
GFR calc non Af Amer: 19 mL/min — ABNORMAL LOW (ref >60)
GFR, EST AFRICAN AMERICAN: 22 mL/min — AB (ref >60)
Glucose, Bld: 101 mg/dL — ABNORMAL HIGH (ref 65–99)
POTASSIUM: 4.2 mmol/L (ref 3.5–5.1)
SODIUM: 131 mmol/L — AB (ref 135–145)

## 2015-05-30 LAB — CULTURE, BLOOD (ROUTINE X 2)
Culture: NO GROWTH
Culture: NO GROWTH

## 2015-05-30 MED ORDER — LORAZEPAM 2 MG/ML IJ SOLN
0.5000 mg | INTRAMUSCULAR | Status: DC | PRN
  Administered 2015-06-01 – 2015-06-02 (×5): 0.5 mg via INTRAVENOUS
  Filled 2015-05-30 (×5): qty 1

## 2015-05-30 MED ORDER — NOREPINEPHRINE BITARTRATE 1 MG/ML IV SOLN
8.0000 ug/min | INTRAVENOUS | Status: DC
  Administered 2015-05-31 – 2015-06-01 (×2): 8 ug/min via INTRAVENOUS
  Filled 2015-05-30 (×4): qty 16

## 2015-05-30 MED ORDER — POLYETHYLENE GLYCOL 3350 17 G PO PACK
17.0000 g | PACK | Freq: Every day | ORAL | Status: DC
  Administered 2015-05-30 – 2015-06-01 (×3): 17 g via ORAL
  Filled 2015-05-30 (×3): qty 1

## 2015-05-30 NOTE — Progress Notes (Signed)
Advanced Heart Failure Rounding Note   Subjective:    Agitated. Coughing over night. Unable to rest. Remains of norepi and milrinone.  Diuresed 4 pounds. Creatinine improved. Unable to get co-ox due to clotted picc port    Objective:   Weight Range:  Vital Signs:   Temp:  [97.5 F (36.4 C)-98.2 F (36.8 C)] 97.5 F (36.4 C) (05/18 0812) Pulse Rate:  [62-73] 70 (05/18 0700) Resp:  [13-26] 15 (05/18 0700) BP: (104-133)/(67-87) 127/82 mmHg (05/18 0700) SpO2:  [97 %-100 %] 100 % (05/18 0700) Weight:  [142 lb 10.2 oz (64.7 kg)] 142 lb 10.2 oz (64.7 kg) (05/18 0500) Last BM Date: 05/27/15  Weight change: Filed Weights   05/28/15 0303 05/29/15 0500 05/30/15 0500  Weight: 140 lb 8 oz (63.73 kg) 146 lb 9.7 oz (66.5 kg) 142 lb 10.2 oz (64.7 kg)    Intake/Output:   Intake/Output Summary (Last 24 hours) at 05/30/15 0813 Last data filed at 05/30/15 0700  Gross per 24 hour  Intake 1331.2 ml  Output   2680 ml  Net -1348.8 ml     Physical Exam: CVP 18 General: Sitting on the side of the bed. Weak.  HEENT: normal Neck: supple. JVP ear  Carotids 2+ bilat; no bruits. No thyromegaly or nodule noted. Cor: PMI laterally displaced. regular Lungs: Crackles RML RLL LLL Abdomen: soft, mildly distended, , no HSM. No bruits or masses. +BS  Extremities: no cyanosis, clubbing, rash, no edema. Multiple tophi bilateral hands. RUE PICC Neuro: alert & oriented x 3, mildly confused. cranial nerves grossly intact. moves all 4 extremities w/o difficulty. Skin: no rash.  Telemetry: AF with v pacing  Labs: Basic Metabolic Panel:  Recent Labs Lab 05/26/15 0929 05/27/15 0021 05/27/15 1026 05/28/15 0415 05/29/15 0534 05/30/15 0417  NA 130* 129*  --  129* 128* 131*  K 5.7* 6.6* 5.8* 5.5* 5.8* 4.2  CL 98* 94*  --  91* 92* 93*  CO2 19* 24  --  23 24 23   GLUCOSE 106* 82  --  79 92 101*  BUN 41* 48*  --  58* 63* 56*  CREATININE 2.29* 2.80*  --  2.92* 2.63* 2.31*  CALCIUM 9.0 9.3  --   9.2 9.0 8.5*    Liver Function Tests: No results for input(s): AST, ALT, ALKPHOS, BILITOT, PROT, ALBUMIN in the last 168 hours. No results for input(s): LIPASE, AMYLASE in the last 168 hours. No results for input(s): AMMONIA in the last 168 hours.  CBC:  Recent Labs Lab 05/13/2015 2358 05/26/15 0454 05/28/15 0415 05/29/15 0534 05/30/15 0417  WBC 10.3 16.1* 13.8* 12.2* 10.3  HGB 10.9* 11.0* 10.3* 10.3* 11.3*  HCT 35.9* 36.6 34.8* 33.7* 36.9  MCV 80.9 83.0 80.2 78.7 78.2  PLT 199 179 187 227 190    Cardiac Enzymes:  Recent Labs Lab 05/22/2015 2358 05/25/15 0944  TROPONINI 0.14* 0.17*    BNP: BNP (last 3 results)  Recent Labs  02/25/15 1115 03/08/15 0120 06/10/2015 2358  BNP 1364.1* 1414.9* 1407.1*    ProBNP (last 3 results) No results for input(s): PROBNP in the last 8760 hours.    Other results:  Imaging: No results found.   Medications:     Scheduled Medications: . alteplase  2 mg Intracatheter Once  . alteplase  2 mg Intracatheter Once  . alteplase  2 mg Intracatheter Once  . ezetimibe  10 mg Oral Q M,W,F  . fluticasone  2 spray Each Nare Daily  . furosemide  80 mg Intravenous BID  . levothyroxine  100 mcg Oral QHS  . ondansetron (ZOFRAN) IV  4 mg Intravenous Once  . piperacillin-tazobactam (ZOSYN)  IV  2.25 g Intravenous Q6H  . pneumococcal 23 valent vaccine  0.5 mL Intramuscular Tomorrow-1000  . polyethylene glycol  17 g Oral Daily  . sodium chloride flush  3 mL Intravenous Q12H  . sodium chloride flush  3 mL Intravenous Q12H  . sodium polystyrene  15 g Oral Once    Infusions: . DOBUTamine 7.5 mcg/kg/min (05/30/15 0700)  . norepinephrine (LEVOPHED) Adult infusion 8 mcg/min (05/30/15 0700)    PRN Medications: sodium chloride, acetaminophen, albuterol, guaiFENesin-dextromethorphan, morphine injection, ondansetron (ZOFRAN) IV, senna-docusate, simethicone, sodium chloride flush, sodium chloride flush, triamcinolone cream   Assessment:    1. Cardiogenic shock 2. Acute Respiratory Failure with hypoxia  3. Acute on Chronic Systolic HF on home dobutamine 4. Chronic A fib 5. A/C CKD, stage IV 6. Hyperkalemia  7. Limited Code - meds only   Plan/Discussion:    She has end-stage HF with poor response to dual inotropes. She is not candidate for mechanical support.   1) Continue supportive care with dual inotropes and iv diuresis for now with no escalation of care 2) Transition to comfort care 3) ICD has been deactivated  I talked with both of her daughters. Family ok with prn ativan. Will likely need to start morphine later today.   Length of Stay: [place] NP-C  05/30/2015, 8:13 AM  Advanced Heart Failure Team Pager [phone] (M-F; 7a - 4p)  Please contact [facility] for night-coverage after hours (4p -7a ) and weekends on [email or web address]  Patient seen and examined with Darrick Grinder, NP. We discussed all aspects of the encounter. I agree with the assessment and plan as stated above.   Clinically has responded fairly well to dual inotropes with good urine output and improving renal function but co-ox still extremely low on dual inotropes. ICD now off (CRT still on).   We had long talk and she wants to keep pushing forward for another day or two but if things get worse she would like to transition to comfort care. I   The patient is critically ill with multiple organ systems failure and requires high complexity decision making for assessment and support, frequent evaluation and titration of therapies, application of advanced monitoring technologies and extensive interpretation of multiple databases.   Critical Care Time devoted to patient care services described in this note is 35 Minutes.

## 2015-05-30 NOTE — Progress Notes (Signed)
PT Cancellation Note  Patient Details Name: MURRIEL WAYE MRN: [id number] DOB: 31-Dec-1932   Cancelled Treatment:    Reason Eval/Treat Not Completed: PT screened, no needs identified, will sign off   Noted events of 5/17.    Sanora Cunanan 05/30/2015, 7:57 AM  Pager [phone]

## 2015-05-31 LAB — CBC
HEMATOCRIT: 35.2 % — AB (ref 36.0–46.0)
Hemoglobin: 10.5 g/dL — ABNORMAL LOW (ref 12.0–15.0)
MCH: 23.4 pg — AB (ref 26.0–34.0)
MCHC: 29.8 g/dL — AB (ref 30.0–36.0)
MCV: 78.6 fL (ref 78.0–100.0)
Platelets: 205 10*3/uL (ref 150–400)
RBC: 4.48 MIL/uL (ref 3.87–5.11)
RDW: 19.3 % — AB (ref 11.5–15.5)
WBC: 9.8 10*3/uL (ref 4.0–10.5)

## 2015-05-31 LAB — CARBOXYHEMOGLOBIN
CARBOXYHEMOGLOBIN: 0.9 % (ref 0.5–1.5)
Methemoglobin: 0.6 % (ref 0.0–1.5)
O2 Saturation: 43.6 %
Total hemoglobin: 15.1 g/dL (ref 12.0–16.0)

## 2015-05-31 LAB — BASIC METABOLIC PANEL
ANION GAP: 11 (ref 5–15)
BUN: 41 mg/dL — ABNORMAL HIGH (ref 6–20)
CHLORIDE: 91 mmol/L — AB (ref 101–111)
CO2: 30 mmol/L (ref 22–32)
CREATININE: 1.9 mg/dL — AB (ref 0.44–1.00)
Calcium: 8.6 mg/dL — ABNORMAL LOW (ref 8.9–10.3)
GFR calc non Af Amer: 24 mL/min — ABNORMAL LOW (ref >60)
GFR, EST AFRICAN AMERICAN: 27 mL/min — AB (ref >60)
Glucose, Bld: 127 mg/dL — ABNORMAL HIGH (ref 65–99)
Potassium: 3.5 mmol/L (ref 3.5–5.1)
SODIUM: 132 mmol/L — AB (ref 135–145)

## 2015-05-31 MED ORDER — POTASSIUM CHLORIDE CRYS ER 20 MEQ PO TBCR
20.0000 meq | EXTENDED_RELEASE_TABLET | Freq: Once | ORAL | Status: DC
  Filled 2015-05-31: qty 1

## 2015-05-31 MED ORDER — PIPERACILLIN-TAZOBACTAM 3.375 G IVPB
3.3750 g | Freq: Three times a day (TID) | INTRAVENOUS | Status: DC
  Administered 2015-05-31 – 2015-06-02 (×7): 3.375 g via INTRAVENOUS
  Filled 2015-05-31 (×8): qty 50

## 2015-05-31 MED ORDER — POTASSIUM CHLORIDE CRYS ER 10 MEQ PO TBCR
20.0000 meq | EXTENDED_RELEASE_TABLET | Freq: Once | ORAL | Status: AC
  Administered 2015-05-31: 20 meq via ORAL
  Filled 2015-05-31: qty 2

## 2015-05-31 NOTE — Progress Notes (Signed)
Pharmacy Antibiotic Note  Sandra [name] is a 82 y.o. female who continues on zosyn for aspiration pneumonia. Renal function improving so will adjust dose.  Plan: 1) Change zosyn to 3.375g IV q8 (4 hour infusion) 2) Follow up LOT  Height: 5\' 3"  (160 cm) Weight: 143 lb 11.8 oz (65.2 kg) IBW/kg (Calculated) : 52.4  Temp (24hrs), Avg:97.4 F (36.3 C), Min:97.2 F (36.2 C), Max:97.8 F (36.6 C)   Recent Labs Lab 05/25/15 0015 05/26/15 0454  05/27/15 0021 05/28/15 0415 05/29/15 0534 05/30/15 0417 05/31/15 0417 05/31/15 0418  WBC  --  16.1*  --   --  13.8* 12.2* 10.3 9.8  --   CREATININE  --  2.12*  < > 2.80* 2.92* 2.63* 2.31*  --  1.90*  LATICACIDVEN 1.41  --   --   --   --   --   --   --   --   < > = values in this interval not displayed.  Estimated Creatinine Clearance: 21.1 mL/min (by C-G formula based on Cr of 1.9).    Antimicrobials this admission: Azithro 5/13>>5/16 CTX 5/13>>5/16 Zosyn 5/16>>  Dose adjustments this admission: n/a  Microbiology results: 5/12 blood x 2 - NGTD 5/13 sputum - suggest recollect  Thank you for allowing pharmacy to be a part of this patient's care.  Deboraha Sprang 05/31/2015 12:52 PM

## 2015-05-31 NOTE — Progress Notes (Addendum)
Advanced Heart Failure Rounding Note   Subjective:    Coox 49% on dobutamine and norepi.   Sleeping currently.  Family states pt coughing some of the night but seemed to do ago.   No increased work of breathing or complaints of pain thus far.  Seems comfortable for the most part. Has not required ativan.   Out ~ 500 cc but weight up 1 lb.  Creatinine improved and K trending down.   Objective:   Weight Range:  Vital Signs:   Temp:  [97.3 F (36.3 C)-97.5 F (36.4 C)] 97.5 F (36.4 C) (05/19 0400) Pulse Rate:  [55-73] 70 (05/19 0700) Resp:  [12-32] 13 (05/19 0700) BP: (107-139)/(66-90) 112/71 mmHg (05/19 0700) SpO2:  [98 %-100 %] 100 % (05/19 0700) Weight:  [143 lb 11.8 oz (65.2 kg)] 143 lb 11.8 oz (65.2 kg) (05/19 0500) Last BM Date: 05/27/15  Weight change: Filed Weights   05/29/15 0500 05/30/15 0500 05/31/15 0500  Weight: 146 lb 9.7 oz (66.5 kg) 142 lb 10.2 oz (64.7 kg) 143 lb 11.8 oz (65.2 kg)    Intake/Output:   Intake/Output Summary (Last 24 hours) at 05/31/15 0743 Last data filed at 05/31/15 0600  Gross per 24 hour  Intake 1623.2 ml  Output   1935 ml  Net -311.8 ml     Physical Exam: CVP 10-11 General: Sleeping, NAD HEENT: normal Neck: supple. JVP up to jaw  Carotids 2+ bilat; no bruits. No thyromegaly or nodule noted. Cor: PMI laterally displaced. regular Lungs: Crackles RML RLL LLL Abdomen: soft, mildly distended, non-tender,  no HSM. No bruits or masses. +BS  Extremities: no cyanosis, clubbing, rash, no edema. Multiple tophi bilateral hands. RUE PICC Neuro: alert & oriented x 3, mildly confused. cranial nerves grossly intact. moves all 4 extremities w/o difficulty. Skin: no rash.  Telemetry: AF with v pacing in 70s  Labs: Basic Metabolic Panel:  Recent Labs Lab 05/27/15 0021 05/27/15 1026 05/28/15 0415 05/29/15 0534 05/30/15 0417 05/31/15 0418  NA 129*  --  129* 128* 131* 132*  K 6.6* 5.8* 5.5* 5.8* 4.2 3.5  CL 94*  --  91* 92* 93*  91*  CO2 24  --  23 24 23 30   GLUCOSE 82  --  79 92 101* 127*  BUN 48*  --  58* 63* 56* 41*  CREATININE 2.80*  --  2.92* 2.63* 2.31* 1.90*  CALCIUM 9.3  --  9.2 9.0 8.5* 8.6*    Liver Function Tests: No results for input(s): AST, ALT, ALKPHOS, BILITOT, PROT, ALBUMIN in the last 168 hours. No results for input(s): LIPASE, AMYLASE in the last 168 hours. No results for input(s): AMMONIA in the last 168 hours.  CBC:  Recent Labs Lab 05/26/15 0454 05/28/15 0415 05/29/15 0534 05/30/15 0417 05/31/15 0417  WBC 16.1* 13.8* 12.2* 10.3 9.8  HGB 11.0* 10.3* 10.3* 11.3* 10.5*  HCT 36.6 34.8* 33.7* 36.9 35.2*  MCV 83.0 80.2 78.7 78.2 78.6  PLT 179 187 227 190 205    Cardiac Enzymes:  Recent Labs Lab 06/05/2015 2358 05/25/15 0944  TROPONINI 0.14* 0.17*    BNP: BNP (last 3 results)  Recent Labs  02/25/15 1115 03/08/15 0120 05/28/2015 2358  BNP 1364.1* 1414.9* 1407.1*    ProBNP (last 3 results) No results for input(s): PROBNP in the last 8760 hours.    Other results:  Imaging: No results found.   Medications:     Scheduled Medications: . alteplase  2 mg Intracatheter Once  .  alteplase  2 mg Intracatheter Once  . alteplase  2 mg Intracatheter Once  . ezetimibe  10 mg Oral Q M,W,F  . fluticasone  2 spray Each Nare Daily  . furosemide  80 mg Intravenous BID  . levothyroxine  100 mcg Oral QHS  . ondansetron (ZOFRAN) IV  4 mg Intravenous Once  . piperacillin-tazobactam (ZOSYN)  IV  2.25 g Intravenous Q6H  . pneumococcal 23 valent vaccine  0.5 mL Intramuscular Tomorrow-1000  . polyethylene glycol  17 g Oral Daily  . sodium chloride flush  3 mL Intravenous Q12H  . sodium chloride flush  3 mL Intravenous Q12H  . sodium polystyrene  15 g Oral Once    Infusions: . DOBUTamine 7.5 mcg/kg/min (05/30/15 1900)  . norepinephrine (LEVOPHED) Adult infusion      PRN Medications: sodium chloride, acetaminophen, albuterol, guaiFENesin-dextromethorphan, LORazepam, morphine  injection, ondansetron (ZOFRAN) IV, senna-docusate, simethicone, sodium chloride flush, sodium chloride flush, triamcinolone cream   Assessment:   1. Cardiogenic shock 2. Acute Respiratory Failure with hypoxia  3. Acute on Chronic Systolic HF on home dobutamine 4. Chronic A fib 5. A/C CKD, stage IV 6. Hyperkalemia/hyponatremia 7. Limited Code - meds only   Plan/Discussion:    She has end-stage HF with poor response to dual inotropes. She is not candidate for mechanical support.   Per family and pt wishes, will continue supportive care with dual inotropes and iv diuresis for now with no escalation of care.  Family understands that if she decompensates best option will be to transition to full comfort care.  Her ICD has been deactivated  Now on prn ativan and prn morphine. Suspect that she will continue to gradually decompensate and require full transition to comfort care.   Length of Stay: 6  Shirley Friar PA-C  05/31/2015, 7:43 AM  Advanced Heart Failure Team Pager [phone] (M-F; 7a - 4p)  Please contact [facility] for night-coverage after hours (4p -7a ) and weekends on [email or web address]  Patient seen and examined with Oda Kilts, PA-C. We discussed all aspects of the encounter. I agree with the assessment and plan as stated above.   Co-ox improved on dual inotropes but still in shock range. Fortunately she is comfortable. CVP coming down but still elevated. Creatinine back to baseline. Will continue supportive care for now and follow co-ox and CVP. Continue dobutamine at 7.5 and levophed at 8. Will eventually need to try and wean levophed.   Will increase pacing rate to 80-85 to see that helps.   The patient is critically ill with multiple organ systems failure and requires high complexity decision making for assessment and support, frequent evaluation and titration of therapies, application of advanced monitoring technologies and extensive interpretation of multiple  databases.   Critical Care Time devoted to patient care services described in this note is 35 Minutes.  Julies Carmickle,MD 10:41 AM

## 2015-06-01 ENCOUNTER — Inpatient Hospital Stay (HOSPITAL_COMMUNITY): Payer: Medicare Other

## 2015-06-01 LAB — HEPARIN LEVEL (UNFRACTIONATED): HEPARIN UNFRACTIONATED: 0.65 [IU]/mL (ref 0.30–0.70)

## 2015-06-01 LAB — CARBOXYHEMOGLOBIN
CARBOXYHEMOGLOBIN: 1.4 % (ref 0.5–1.5)
CARBOXYHEMOGLOBIN: 1.6 % — AB (ref 0.5–1.5)
Methemoglobin: 0.8 % (ref 0.0–1.5)
Methemoglobin: 0.8 % (ref 0.0–1.5)
O2 Saturation: 63.3 %
O2 Saturation: 50.4 %
Total hemoglobin: 10.8 g/dL — ABNORMAL LOW (ref 12.0–16.0)
Total hemoglobin: 10.9 g/dL — ABNORMAL LOW (ref 12.0–16.0)

## 2015-06-01 LAB — CBC
HEMATOCRIT: 35 % — AB (ref 36.0–46.0)
Hemoglobin: 10.3 g/dL — ABNORMAL LOW (ref 12.0–15.0)
MCH: 23.4 pg — AB (ref 26.0–34.0)
MCHC: 29.4 g/dL — AB (ref 30.0–36.0)
MCV: 79.5 fL (ref 78.0–100.0)
Platelets: 213 10*3/uL (ref 150–400)
RBC: 4.4 MIL/uL (ref 3.87–5.11)
RDW: 19.7 % — AB (ref 11.5–15.5)
WBC: 8.5 10*3/uL (ref 4.0–10.5)

## 2015-06-01 LAB — PROTIME-INR
INR: 2.76 — AB (ref 0.00–1.49)
Prothrombin Time: 28.7 seconds — ABNORMAL HIGH (ref 11.6–15.2)

## 2015-06-01 LAB — BASIC METABOLIC PANEL
ANION GAP: 10 (ref 5–15)
BUN: 30 mg/dL — ABNORMAL HIGH (ref 6–20)
CALCIUM: 8.2 mg/dL — AB (ref 8.9–10.3)
CHLORIDE: 89 mmol/L — AB (ref 101–111)
CO2: 31 mmol/L (ref 22–32)
Creatinine, Ser: 1.63 mg/dL — ABNORMAL HIGH (ref 0.44–1.00)
GFR calc non Af Amer: 28 mL/min — ABNORMAL LOW (ref >60)
GFR, EST AFRICAN AMERICAN: 33 mL/min — AB (ref >60)
Glucose, Bld: 234 mg/dL — ABNORMAL HIGH (ref 65–99)
POTASSIUM: 3.3 mmol/L — AB (ref 3.5–5.1)
Sodium: 130 mmol/L — ABNORMAL LOW (ref 135–145)

## 2015-06-01 MED ORDER — POTASSIUM CHLORIDE CRYS ER 20 MEQ PO TBCR
40.0000 meq | EXTENDED_RELEASE_TABLET | Freq: Once | ORAL | Status: AC
  Administered 2015-06-01: 40 meq via ORAL

## 2015-06-01 MED ORDER — POTASSIUM CHLORIDE CRYS ER 10 MEQ PO TBCR
40.0000 meq | EXTENDED_RELEASE_TABLET | Freq: Once | ORAL | Status: AC
  Administered 2015-06-01: 40 meq via ORAL
  Filled 2015-06-01: qty 4

## 2015-06-01 MED ORDER — HEPARIN (PORCINE) IN NACL 100-0.45 UNIT/ML-% IJ SOLN
900.0000 [IU]/h | INTRAMUSCULAR | Status: DC
Start: 2015-06-01 — End: 2015-06-03
  Administered 2015-06-01 – 2015-06-02 (×2): 900 [IU]/h via INTRAVENOUS
  Filled 2015-06-01 (×2): qty 250

## 2015-06-01 MED ORDER — POTASSIUM CHLORIDE CRYS ER 10 MEQ PO TBCR
20.0000 meq | EXTENDED_RELEASE_TABLET | Freq: Two times a day (BID) | ORAL | Status: DC
  Administered 2015-06-01 – 2015-06-02 (×3): 20 meq via ORAL
  Filled 2015-06-01 (×6): qty 2

## 2015-06-01 MED ORDER — ALTEPLASE 2 MG IJ SOLR
2.0000 mg | Freq: Once | INTRAMUSCULAR | Status: AC
  Administered 2015-06-01: 2 mg

## 2015-06-01 MED ORDER — FUROSEMIDE 10 MG/ML IJ SOLN
80.0000 mg | Freq: Two times a day (BID) | INTRAMUSCULAR | Status: DC
  Administered 2015-06-01 – 2015-06-02 (×2): 80 mg via INTRAVENOUS
  Filled 2015-06-01 (×2): qty 8

## 2015-06-01 MED ORDER — POTASSIUM CHLORIDE CRYS ER 20 MEQ PO TBCR: 40.0000 meq | EXTENDED_RELEASE_TABLET | Freq: Once | ORAL | Status: DC

## 2015-06-01 NOTE — Progress Notes (Signed)
ANTICOAGULATION CONSULT NOTE - Follow up   Pharmacy Consult for heparin Indication: atrial fibrillation  Allergies  Allergen Reactions  . Levofloxacin Other (See Comments)    Due to cardiac  AF  . Procaine Hcl Other (See Comments)    REACTION: smothers  . Spironolactone Diarrhea    Abdominal pain  . Amiodarone Nausea And Vomiting  . Tape Other (See Comments)    Tears skin off.  Please use "paper" tape only  . Amoxicillin Other (See Comments)    Rash   . Codeine Other (See Comments)    REACTION: nausea  . Niacin Other (See Comments)    REACTION: rash  . Ramipril Other (See Comments)    unknown  . Statins Other (See Comments)    REACTION: muscle aches    Patient Measurements: Height: 5\' 3"  (160 cm) Weight: 143 lb 4.8 oz (65 kg) IBW/kg (Calculated) : 52.4 Heparin Dosing Weight: 63  Vital Signs: Temp: 97.5 F (36.4 C) (05/20 2000) Temp Source: Axillary (05/20 2000) BP: 115/72 mmHg (05/20 1900) Pulse Rate: 84 (05/20 1900)  Labs:  Recent Labs  05/30/15 0417 05/31/15 0417 05/31/15 0418 06/01/15 0410 06/01/15 0419 06/01/15 1752 06/01/15 2115  HGB 11.3* 10.5*  --  10.3*  --   --   --   HCT 36.9 35.2*  --  35.0*  --   --   --   PLT 190 205  --  213  --   --   --   LABPROT  --   --   --   --   --  28.7*  --   INR  --   --   --   --   --  2.76*  --   HEPARINUNFRC  --   --   --   --   --   --  0.65  CREATININE 2.31*  --  1.90*  --  1.63*  --   --     Estimated Creatinine Clearance: 24.5 mL/min (by C-G formula based on Cr of 1.63).   Medical History: Past Medical History  Diagnosis Date  . Atrial fibrillation ([place]) permanent     on Tikosyn and Coumadin  . Hyperlipidemia   . Hypothyroidism   . Bradycardia   . Cardiomyopathy, hypertrophic nonobstructive ([facility])     ejection fraction 25%  . Anal fissure   . Adenomatous colon polyp   . Diverticulosis   . ICD-CRT     generator change 2013  . Systolic heart failure   . Hyperlipidemia   . Hypertension   .  RBBB plus LA hemiblock   . Family history of adverse reaction to anesthesia     "middle daughter couldn't take Versed"  . CHF (congestive heart failure) ([place])   . Varicose veins   . Chronic bronchitis ([place])     "get it most q spring and fall" (02/06/2015)  . Migraines     "stopped after I went thru the change"  . History of gout   . Situational anxiety   . Chronic renal insufficiency, stage III (moderate)     gd 3-4  . Basal cell carcinoma     "right foot; right leg" (02/06/2015)  . Aspiration pneumonia ([place]) 03/08/2015    Archie Endo 03/08/2015    Medications:  Scheduled:  . alteplase  2 mg Intracatheter Once  . alteplase  2 mg Intracatheter Once  . alteplase  2 mg Intracatheter Once  . ezetimibe  10 mg Oral Q M,W,F  .  fluticasone  2 spray Each Nare Daily  . furosemide  80 mg Intravenous BID  . levothyroxine  100 mcg Oral QHS  . ondansetron (ZOFRAN) IV  4 mg Intravenous Once  . piperacillin-tazobactam (ZOSYN)  IV  3.375 g Intravenous Q8H  . pneumococcal 23 valent vaccine  0.5 mL Intramuscular Tomorrow-1000  . polyethylene glycol  17 g Oral Daily  . potassium chloride  20 mEq Oral BID  . sodium chloride flush  3 mL Intravenous Q12H  . sodium chloride flush  3 mL Intravenous Q12H    Assessment: 82 yo female with hx of Afib. Was on Eliquis PTA but was initially transitioned to heparin this admission, where she was therapeutic on 900 units/hr. Now to resume heparin. Hgb 10.3, PLT 213.  Initial HL after restart is 0.6; however, it appears drip was not started to around 17:30 this afternoon so only ~ 4 hour level. No other acute issues.   Goal of Therapy:  Heparin level 0.3-0.7 units/ml Monitor platelets by anticoagulation protocol: Yes   Plan:  1. Continue heparin at 900 units/hr 2. Reassess HL with am labs 3. Daily HL and CBC   Vincenza Hews, PharmD, BCPS 06/01/2015, 9:53 PM Pager: [phone]

## 2015-06-01 NOTE — Progress Notes (Signed)
Spoke with Dr. Haroldine Laws regarding PICC adjustment.  Recommend to withdraw PICC 2 cm at this time.

## 2015-06-01 NOTE — Progress Notes (Signed)
RT notified RN that CVP replacement transducer and set up is ready for placement.

## 2015-06-01 NOTE — Progress Notes (Addendum)
Advanced Heart Failure Rounding Note   Subjective:    Feels weak. Tired. No dyspnea.   Remains on norepi 8 and dobutamine 7.5. Co-ox 63% but not sure if accurate. PICC line no longer working   Weight unchanged. CVP 14   Objective:   Weight Range:  Vital Signs:   Temp:  [97.5 F (36.4 C)-98.2 F (36.8 C)] 98.2 F (36.8 C) (05/20 0800) Pulse Rate:  [69-84] 80 (05/20 1200) Resp:  [15-24] 22 (05/20 1200) BP: (96-126)/(55-85) 120/85 mmHg (05/20 1200) SpO2:  [96 %-100 %] 100 % (05/20 1200) Weight:  [65 kg (143 lb 4.8 oz)] 65 kg (143 lb 4.8 oz) (05/20 0500) Last BM Date: 05/27/15  Weight change: Filed Weights   05/30/15 0500 05/31/15 0500 06/01/15 0500  Weight: 64.7 kg (142 lb 10.2 oz) 65.2 kg (143 lb 11.8 oz) 65 kg (143 lb 4.8 oz)    Intake/Output:   Intake/Output Summary (Last 24 hours) at 06/01/15 1238 Last data filed at 06/01/15 1200  Gross per 24 hour  Intake 454.14 ml  Output   1335 ml  Net -880.86 ml     Physical Exam: CVP 14 General: sitting up in bed NAD HEENT: normal Neck: supple. JVP up to jaw  Carotids 2+ bilat; no bruits. No thyromegaly or nodule noted. Cor: PMI laterally displaced. regular Lungs: Crackles RML RLL LLL Abdomen: soft, mildly distended, non-tender,  no HSM. No bruits or masses. +BS  Extremities: no cyanosis, clubbing, rash, trace edema. Multiple tophi bilateral hands. RUE PICC Neuro: alert & oriented x 3, mildly confused. cranial nerves grossly intact. moves all 4 extremities w/o difficulty. Skin: no rash.  Telemetry: AF with v pacing in 80s  Labs: Basic Metabolic Panel:  Recent Labs Lab 05/28/15 0415 05/29/15 0534 05/30/15 0417 05/31/15 0418 06/01/15 0419  NA 129* 128* 131* 132* 130*  K 5.5* 5.8* 4.2 3.5 3.3*  CL 91* 92* 93* 91* 89*  CO2 23 24 23 30 31   GLUCOSE 79 92 101* 127* 234*  BUN 58* 63* 56* 41* 30*  CREATININE 2.92* 2.63* 2.31* 1.90* 1.63*  CALCIUM 9.2 9.0 8.5* 8.6* 8.2*    Liver Function Tests: No  results for input(s): AST, ALT, ALKPHOS, BILITOT, PROT, ALBUMIN in the last 168 hours. No results for input(s): LIPASE, AMYLASE in the last 168 hours. No results for input(s): AMMONIA in the last 168 hours.  CBC:  Recent Labs Lab 05/28/15 0415 05/29/15 0534 05/30/15 0417 05/31/15 0417 06/01/15 0410  WBC 13.8* 12.2* 10.3 9.8 8.5  HGB 10.3* 10.3* 11.3* 10.5* 10.3*  HCT 34.8* 33.7* 36.9 35.2* 35.0*  MCV 80.2 78.7 78.2 78.6 79.5  PLT 187 227 190 205 213    Cardiac Enzymes: No results for input(s): CKTOTAL, CKMB, CKMBINDEX, TROPONINI in the last 168 hours.  BNP: BNP (last 3 results)  Recent Labs  02/25/15 1115 03/08/15 0120 05/31/2015 2358  BNP 1364.1* 1414.9* 1407.1*    ProBNP (last 3 results) No results for input(s): PROBNP in the last 8760 hours.    Other results:  Imaging: No results found.   Medications:     Scheduled Medications: . alteplase  2 mg Intracatheter Once  . alteplase  2 mg Intracatheter Once  . alteplase  2 mg Intracatheter Once  . ezetimibe  10 mg Oral Q M,W,F  . fluticasone  2 spray Each Nare Daily  . furosemide  80 mg Intravenous BID  . levothyroxine  100 mcg Oral QHS  . ondansetron (ZOFRAN) IV  4 mg  Intravenous Once  . piperacillin-tazobactam (ZOSYN)  IV  3.375 g Intravenous Q8H  . pneumococcal 23 valent vaccine  0.5 mL Intramuscular Tomorrow-1000  . polyethylene glycol  17 g Oral Daily  . sodium chloride flush  3 mL Intravenous Q12H  . sodium chloride flush  3 mL Intravenous Q12H  . sodium polystyrene  15 g Oral Once    Infusions: . DOBUTamine 7.5 mcg/kg/min (06/01/15 0800)  . norepinephrine (LEVOPHED) Adult infusion 8 mcg/min (06/01/15 0800)    PRN Medications: sodium chloride, acetaminophen, albuterol, guaiFENesin-dextromethorphan, LORazepam, morphine injection, ondansetron (ZOFRAN) IV, senna-docusate, simethicone, sodium chloride flush, sodium chloride flush, triamcinolone cream   Assessment:   1. Cardiogenic shock 2.  Acute Respiratory Failure with hypoxia  3. Acute on Chronic Systolic HF on home dobutamine 4. Chronic A fib 5. A/C CKD, stage IV 6. Hyperkalemia/hyponatremia 7. Limited Code - meds only   Plan/Discussion:    Co-ox improved this am on dual inotropes and increased pacer range but unsure if it is accurate. Unable to repeat as PICC line not working. CVP remains markedly elevated.   Will continue inotropes. Resume IV lasix. Switch out PICC line to better follow co-ox.    Will continue supportive care for now. Continue dobutamine at 7.5 and levophed at 8. Will eventually need to try and wean levophed. Family realizes that if no improvement over next 2-3 days best option is to switch to Palliative care.   Continue abx for now. Stop soon. Resume hepatin for AF.   She is limited code. ICD is off.   The patient is critically ill with multiple organ systems failure and requires high complexity decision making for assessment and support, frequent evaluation and titration of therapies, application of advanced monitoring technologies and extensive interpretation of multiple databases.   Critical Care Time devoted to patient care services described in this note is 35 Minutes.  [name],MD 12:38 PM Advanced Heart Failure Team Pager [phone] (M-F; 7a - 4p)  Please contact [facility] for night-coverage after hours (4p -7a ) and weekends on [email or web address]

## 2015-06-01 NOTE — Progress Notes (Addendum)
ANTICOAGULATION CONSULT NOTE - Initial Consult  Pharmacy Consult for heparin Indication: atrial fibrillation  Allergies  Allergen Reactions  . Levofloxacin Other (See Comments)    Due to cardiac  AF  . Procaine Hcl Other (See Comments)    REACTION: smothers  . Spironolactone Diarrhea    Abdominal pain  . Amiodarone Nausea And Vomiting  . Tape Other (See Comments)    Tears skin off.  Please use "paper" tape only  . Amoxicillin Other (See Comments)    Rash   . Codeine Other (See Comments)    REACTION: nausea  . Niacin Other (See Comments)    REACTION: rash  . Ramipril Other (See Comments)    unknown  . Statins Other (See Comments)    REACTION: muscle aches    Patient Measurements: Height: 5\' 3"  (160 cm) Weight: 143 lb 4.8 oz (65 kg) IBW/kg (Calculated) : 52.4 Heparin Dosing Weight: 63  Vital Signs: Temp: 98.2 F (36.8 C) (05/20 0800) Temp Source: Oral (05/20 0800) BP: 120/85 mmHg (05/20 1200) Pulse Rate: 80 (05/20 1200)  Labs:  Recent Labs  05/30/15 0417 05/31/15 0417 05/31/15 0418 06/01/15 0410 06/01/15 0419  HGB 11.3* 10.5*  --  10.3*  --   HCT 36.9 35.2*  --  35.0*  --   PLT 190 205  --  213  --   CREATININE 2.31*  --  1.90*  --  1.63*    Estimated Creatinine Clearance: 24.5 mL/min (by C-G formula based on Cr of 1.63).   Medical History: Past Medical History  Diagnosis Date  . Atrial fibrillation ([place]) permanent     on Tikosyn and Coumadin  . Hyperlipidemia   . Hypothyroidism   . Bradycardia   . Cardiomyopathy, hypertrophic nonobstructive (HCC)     ejection fraction 25%  . Anal fissure   . Adenomatous colon polyp   . Diverticulosis   . ICD-CRT     generator change 2013  . Systolic heart failure   . Hyperlipidemia   . Hypertension   . RBBB plus LA hemiblock   . Family history of adverse reaction to anesthesia     "middle daughter couldn't take Versed"  . CHF (congestive heart failure) ([place])   . Varicose veins   . Chronic bronchitis  ([place])     "get it most q spring and fall" (02/06/2015)  . Migraines     "stopped after I went thru the change"  . History of gout   . Situational anxiety   . Chronic renal insufficiency, stage III (moderate)     gd 3-4  . Basal cell carcinoma     "right foot; right leg" (02/06/2015)  . Aspiration pneumonia ([place]) 03/08/2015    Archie Endo 03/08/2015    Medications:  Scheduled:  . alteplase  2 mg Intracatheter Once  . alteplase  2 mg Intracatheter Once  . alteplase  2 mg Intracatheter Once  . ezetimibe  10 mg Oral Q M,W,F  . fluticasone  2 spray Each Nare Daily  . furosemide  80 mg Intravenous BID  . levothyroxine  100 mcg Oral QHS  . ondansetron (ZOFRAN) IV  4 mg Intravenous Once  . piperacillin-tazobactam (ZOSYN)  IV  3.375 g Intravenous Q8H  . pneumococcal 23 valent vaccine  0.5 mL Intramuscular Tomorrow-1000  . polyethylene glycol  17 g Oral Daily  . potassium chloride  20 mEq Oral BID  . potassium chloride  40 mEq Oral Once  . sodium chloride flush  3 mL Intravenous Q12H  .  sodium chloride flush  3 mL Intravenous Q12H  . sodium polystyrene  15 g Oral Once    Assessment: 82 yo female with hx of Afib. Was on Eliquis PTA but was initially transitioned to heparin this admission where she was therapeutic on 900 units/hr. Now to resume heparin. Hgb 10.3, PLT 213.  Goal of Therapy:  Heparin level 0.3-0.7 units/ml Monitor platelets by anticoagulation protocol: Yes   Plan:  Start heparin infusion at 900 units/hr Check anti-Xa level in 8 hours and daily while on heparin Continue to monitor H&H and platelets   Melburn Popper, PharmD Clinical Pharmacy Resident Pager: [phone] 06/01/2015 1:29 PM

## 2015-06-01 NOTE — Progress Notes (Signed)
Peripherally Inserted Central Catheter exchange  The IV Nurse has discussed with the patient and/or persons authorized to consent for the patient, the purpose of this procedure and the potential benefits and risks involved with this procedure.  The benefits include less needle sticks, lab draws from the catheter and patient may be discharged home with the catheter.  Risks include, but not limited to, infection, bleeding, blood clot (thrombus formation), and puncture of an artery; nerve damage and irregular heat beat.  Alternatives to this procedure were also discussed.  Consent signed by daughter Rukaya Booz  PICC/Midline Placement Documentation  PICC Single Lumen PICC Right (Active)     PICC Double Lumen [id number] PICC Right Basilic 5 cm 0 cm (Active)  Indication for Insertion or Continuance of Line Home intravenous therapies (PICC only) 06/01/2015  2:50 PM  Exposed Catheter (cm) 0 cm 06/01/2015  2:50 PM  Site Assessment Clean;Dry;Intact 06/01/2015  2:50 PM  Lumen #1 Status Flushed;Saline locked;Blood return noted 06/01/2015  2:50 PM  Lumen #2 Status Flushed;Saline locked;Blood return noted 06/01/2015  2:50 PM  Dressing Type Transparent 06/01/2015  2:50 PM  Dressing Status Clean;Dry;Intact 06/01/2015  2:50 PM  Dressing Change Due 06/08/15 06/01/2015  2:50 PM       Gordan Payment 06/01/2015, 2:51 PM

## 2015-06-02 ENCOUNTER — Inpatient Hospital Stay (HOSPITAL_COMMUNITY): Payer: Medicare Other

## 2015-06-02 DIAGNOSIS — J690 Pneumonitis due to inhalation of food and vomit: Secondary | ICD-10-CM | POA: Diagnosis not present

## 2015-06-02 LAB — BASIC METABOLIC PANEL
ANION GAP: 12 (ref 5–15)
BUN: 25 mg/dL — ABNORMAL HIGH (ref 6–20)
CALCIUM: 8.1 mg/dL — AB (ref 8.9–10.3)
CO2: 30 mmol/L (ref 22–32)
Chloride: 91 mmol/L — ABNORMAL LOW (ref 101–111)
Creatinine, Ser: 1.62 mg/dL — ABNORMAL HIGH (ref 0.44–1.00)
GFR, EST AFRICAN AMERICAN: 33 mL/min — AB (ref >60)
GFR, EST NON AFRICAN AMERICAN: 29 mL/min — AB (ref >60)
Glucose, Bld: 143 mg/dL — ABNORMAL HIGH (ref 65–99)
Potassium: 4.4 mmol/L (ref 3.5–5.1)
SODIUM: 133 mmol/L — AB (ref 135–145)

## 2015-06-02 LAB — CBC
HCT: 36.4 % (ref 36.0–46.0)
Hemoglobin: 10.5 g/dL — ABNORMAL LOW (ref 12.0–15.0)
MCH: 23.4 pg — ABNORMAL LOW (ref 26.0–34.0)
MCHC: 28.8 g/dL — ABNORMAL LOW (ref 30.0–36.0)
MCV: 81.1 fL (ref 78.0–100.0)
Platelets: 237 10*3/uL (ref 150–400)
RBC: 4.49 MIL/uL (ref 3.87–5.11)
RDW: 20.2 % — AB (ref 11.5–15.5)
WBC: 8.4 10*3/uL (ref 4.0–10.5)

## 2015-06-02 LAB — CARBOXYHEMOGLOBIN
CARBOXYHEMOGLOBIN: 1.4 % (ref 0.5–1.5)
CARBOXYHEMOGLOBIN: 1.8 % — AB (ref 0.5–1.5)
Methemoglobin: 0.7 % (ref 0.0–1.5)
Methemoglobin: 0.7 % (ref 0.0–1.5)
O2 SAT: 53 %
O2 Saturation: 57.8 %
TOTAL HEMOGLOBIN: 10.9 g/dL — AB (ref 12.0–16.0)
Total hemoglobin: 10.9 g/dL — ABNORMAL LOW (ref 12.0–16.0)

## 2015-06-02 LAB — HEPARIN LEVEL (UNFRACTIONATED): Heparin Unfractionated: 0.35 IU/mL (ref 0.30–0.70)

## 2015-06-02 MED ORDER — POTASSIUM CHLORIDE CRYS ER 20 MEQ PO TBCR
40.0000 meq | EXTENDED_RELEASE_TABLET | Freq: Once | ORAL | Status: AC
  Administered 2015-06-02: 40 meq via ORAL
  Filled 2015-06-02: qty 2

## 2015-06-02 MED ORDER — FUROSEMIDE 10 MG/ML IJ SOLN
80.0000 mg | Freq: Two times a day (BID) | INTRAMUSCULAR | Status: DC
  Administered 2015-06-02 – 2015-06-04 (×4): 80 mg via INTRAVENOUS
  Filled 2015-06-02 (×4): qty 8

## 2015-06-02 MED ORDER — METOLAZONE 5 MG PO TABS: 5.0000 mg | ORAL_TABLET | Freq: Every day | ORAL | Status: DC

## 2015-06-02 MED ORDER — METOLAZONE 5 MG PO TABS
5.0000 mg | ORAL_TABLET | Freq: Every day | ORAL | Status: DC
  Administered 2015-06-02: 5 mg via ORAL
  Filled 2015-06-02: qty 1

## 2015-06-02 MED ORDER — GUAIFENESIN 200 MG PO TABS
200.0000 mg | ORAL_TABLET | ORAL | Status: DC | PRN
  Administered 2015-06-02 (×2): 200 mg via ORAL
  Filled 2015-06-02 (×3): qty 1

## 2015-06-02 MED ORDER — SODIUM CHLORIDE 0.9 % IV SOLN
125.0000 ug/h | INTRAVENOUS | Status: DC
  Filled 2015-06-02: qty 50

## 2015-06-02 NOTE — Progress Notes (Signed)
ANTICOAGULATION CONSULT NOTE - Follow up   Pharmacy Consult for heparin Indication: atrial fibrillation  Patient Measurements: Height: 5\' 3"  (160 cm) Weight: 143 lb 4.8 oz (65 kg) IBW/kg (Calculated) : 52.4 Heparin Dosing Weight: 63  Vital Signs: Temp: 97.5 F (36.4 C) (05/20 2000) Temp Source: Axillary (05/20 2000) BP: 115/72 mmHg (05/20 1900) Pulse Rate: 84 (05/20 1900)  Labs:  Recent Labs  05/30/15 0417 05/31/15 0417 05/31/15 0418 06/01/15 0410 06/01/15 0419 06/01/15 1752 06/01/15 2115  HGB 11.3* 10.5*  --  10.3*  --   --   --   HCT 36.9 35.2*  --  35.0*  --   --   --   PLT 190 205  --  213  --   --   --   LABPROT  --   --   --   --   --  28.7*  --   INR  --   --   --   --   --  2.76*  --   HEPARINUNFRC  --   --   --   --   --   --  0.65  CREATININE 2.31*  --  1.90*  --  1.63*  --   --     Estimated Creatinine Clearance: 24.5 mL/min (by C-G formula based on Cr of 1.63).   Medical History: Past Medical History  Diagnosis Date  . Atrial fibrillation ([place]) permanent     on Tikosyn and Coumadin  . Hyperlipidemia   . Hypothyroidism   . Bradycardia   . Cardiomyopathy, hypertrophic nonobstructive (HCC)     ejection fraction 25%  . Anal fissure   . Adenomatous colon polyp   . Diverticulosis   . ICD-CRT     generator change 2013  . Systolic heart failure   . Hyperlipidemia   . Hypertension   . RBBB plus LA hemiblock   . Family history of adverse reaction to anesthesia     "middle daughter couldn't take Versed"  . CHF (congestive heart failure) ([place])   . Varicose veins   . Chronic bronchitis ([place])     "get it most q spring and fall" (02/06/2015)  . Migraines     "stopped after I went thru the change"  . History of gout   . Situational anxiety   . Chronic renal insufficiency, stage III (moderate)     gd 3-4  . Basal cell carcinoma     "right foot; right leg" (02/06/2015)  . Aspiration pneumonia ([place]) 03/08/2015    Archie Endo 03/08/2015    Medications:   Scheduled:  . alteplase  2 mg Intracatheter Once  . alteplase  2 mg Intracatheter Once  . alteplase  2 mg Intracatheter Once  . ezetimibe  10 mg Oral Q M,W,F  . fluticasone  2 spray Each Nare Daily  . furosemide  80 mg Intravenous BID  . levothyroxine  100 mcg Oral QHS  . ondansetron (ZOFRAN) IV  4 mg Intravenous Once  . piperacillin-tazobactam (ZOSYN)  IV  3.375 g Intravenous Q8H  . pneumococcal 23 valent vaccine  0.5 mL Intramuscular Tomorrow-1000  . polyethylene glycol  17 g Oral Daily  . potassium chloride  20 mEq Oral BID  . sodium chloride flush  3 mL Intravenous Q12H  . sodium chloride flush  3 mL Intravenous Q12H    Assessment: 82 yo female with hx of Afib. Was on Eliquis PTA but was initially transitioned to heparin this admission. At that time she was  therapeutic on 900 units/hr. Now to resume heparin. Hgb 10.5, PLT 237.  Initial HL after restart was 0.6; however, it appears the drip was started late (only a 4 hr level). This am HL is again therapeutic*2. Will transition to daily HL.  Goal of Therapy:  Heparin level 0.3-0.7 units/ml Monitor platelets by anticoagulation protocol: Yes   Plan:  1. Continue heparin at 900 units/hr 2. Daily HL and CBC   Melburn Popper, PharmD Clinical Pharmacy Resident Pager: [phone] 06/02/2015 9:11 AM

## 2015-06-02 NOTE — Progress Notes (Signed)
Advanced Heart Failure Rounding Note   Subjective:    PICC replaced yesterday. Denies dyspnea.   Remains on norepi 8 and dobutamine 7.5. IV lasix restarted yesterday. Co-ox 58%    Weight down 1 pound. CVP 15. No BMET today yet.    Objective:   Weight Range:  Vital Signs:   Temp:  [97.1 F (36.2 C)-98.3 F (36.8 C)] 97.4 F (36.3 C) (05/21 0900) Pulse Rate:  [79-84] 83 (05/21 1200) Resp:  [20-32] 25 (05/21 1200) BP: (92-123)/(55-84) 112/76 mmHg (05/21 1200) SpO2:  [99 %-100 %] 100 % (05/21 1200) Weight:  [64.8 kg (142 lb 13.7 oz)] 64.8 kg (142 lb 13.7 oz) (05/21 0500) Last BM Date: 06/02/15  Weight change: Filed Weights   05/31/15 0500 06/01/15 0500 06/02/15 0500  Weight: 65.2 kg (143 lb 11.8 oz) 65 kg (143 lb 4.8 oz) 64.8 kg (142 lb 13.7 oz)    Intake/Output:   Intake/Output Summary (Last 24 hours) at 06/02/15 1237 Last data filed at 06/02/15 1000  Gross per 24 hour  Intake 1083.82 ml  Output    990 ml  Net  93.82 ml     Physical Exam: CVP 15 General: sitting up in bed. Lethargic but awake  HEENT: normal Neck: supple. JVP up to jaw  Carotids 2+ bilat; no bruits. No thyromegaly or nodule noted. Cor: PMI laterally displaced. regular Lungs: fine crackles  Abdomen: soft, mildly distended, non-tender,  no HSM. No bruits or masses. +BS  Extremities: no cyanosis, clubbing, rash, trace edema. Multiple tophi bilateral hands. RUE PICC Neuro: alert & oriented x 3, mildly confused. cranial nerves grossly intact. moves all 4 extremities w/o difficulty. Skin: no rash.  Telemetry: AF with v pacing in 80s  Labs: Basic Metabolic Panel:  Recent Labs Lab 05/28/15 0415 05/29/15 0534 05/30/15 0417 05/31/15 0418 06/01/15 0419  NA 129* 128* 131* 132* 130*  K 5.5* 5.8* 4.2 3.5 3.3*  CL 91* 92* 93* 91* 89*  CO2 23 24 23 30 31   GLUCOSE 79 92 101* 127* 234*  BUN 58* 63* 56* 41* 30*  CREATININE 2.92* 2.63* 2.31* 1.90* 1.63*  CALCIUM 9.2 9.0 8.5* 8.6* 8.2*     Liver Function Tests: No results for input(s): AST, ALT, ALKPHOS, BILITOT, PROT, ALBUMIN in the last 168 hours. No results for input(s): LIPASE, AMYLASE in the last 168 hours. No results for input(s): AMMONIA in the last 168 hours.  CBC:  Recent Labs Lab 05/29/15 0534 05/30/15 0417 05/31/15 0417 06/01/15 0410 06/02/15 0320  WBC 12.2* 10.3 9.8 8.5 8.4  HGB 10.3* 11.3* 10.5* 10.3* 10.5*  HCT 33.7* 36.9 35.2* 35.0* 36.4  MCV 78.7 78.2 78.6 79.5 81.1  PLT 227 190 205 213 237    Cardiac Enzymes: No results for input(s): CKTOTAL, CKMB, CKMBINDEX, TROPONINI in the last 168 hours.  BNP: BNP (last 3 results)  Recent Labs  02/25/15 1115 03/08/15 0120 05/23/2015 2358  BNP 1364.1* 1414.9* 1407.1*    ProBNP (last 3 results) No results for input(s): PROBNP in the last 8760 hours.    Other results:  Imaging: Dg Chest Port 1 View  06/02/2015  CLINICAL DATA:  Pulmonary edema.  Congestive heart failure. EXAM: PORTABLE CHEST 1 VIEW COMPARISON:  Jun 01, 2015 FINDINGS: Cardiomegaly is identified. Probable effusion and opacity on the left obscures the left hemidiaphragm. Pulmonary edema is also identified, similar in the interval. The right PICC line continues to terminate in the right atrium. IMPRESSION: 1. Cardiomegaly, left effusion and opacity, and mild  edema. 2. The tip of the right PICC line continues to terminate in the right atrium. Electronically Signed   By: Dorise Bullion III M.D   On: 06/02/2015 09:46   Dg Chest Port 1 View  06/01/2015  CLINICAL DATA:  Confirm central line placement. EXAM: PORTABLE CHEST 1 VIEW COMPARISON:  May 26, 2015 FINDINGS: A right PICC line terminates within the right side of the heart 4 cm below the caval atrial junction. No other venous access is identified. Mild opacity in the region of the right fissure has decreased in the interval. Persistent effusion and opacity in left lung base is stable. The lung apices were not imaged. A 3 lead AICD device  remains. Cardiomegaly is unchanged. IMPRESSION: 1. The distal tip of the right PICC line terminates in the right atrium. Recommend withdrawing 4 cm. No other venous access identified. Recommend clinical correlation. 2. Persistent effusion and underlying opacity on the left. Decreasing fluid in the right fissure. Electronically Signed   By: Dorise Bullion III M.D   On: 06/01/2015 15:25     Medications:     Scheduled Medications: . alteplase  2 mg Intracatheter Once  . ezetimibe  10 mg Oral Q M,W,F  . fluticasone  2 spray Each Nare Daily  . furosemide  80 mg Intravenous BID  . levothyroxine  100 mcg Oral QHS  . ondansetron (ZOFRAN) IV  4 mg Intravenous Once  . piperacillin-tazobactam (ZOSYN)  IV  3.375 g Intravenous Q8H  . polyethylene glycol  17 g Oral Daily  . potassium chloride  20 mEq Oral BID    Infusions: . DOBUTamine 7.5 mcg/kg/min (06/01/15 0800)  . heparin 900 Units/hr (06/02/15 1156)  . norepinephrine (LEVOPHED) Adult infusion 8 mcg/min (06/01/15 0800)    PRN Medications: acetaminophen, albuterol, guaiFENesin-dextromethorphan, LORazepam, morphine injection, ondansetron (ZOFRAN) IV, senna-docusate, simethicone, sodium chloride flush, triamcinolone cream   Assessment:   1. Cardiogenic shock 2. Acute Respiratory Failure with hypoxia  3. Acute on Chronic Systolic HF on home dobutamine 4. Chronic A fib 5. A/C CKD, stage IV 6. Hyperkalemia/hyponatremia 7. Limited Code - meds only   Plan/Discussion:    Co-ox improved on dual inotropes but CVP remains very high despite IV lasix.   Long talk with her and her family about her situation and the fact that she has end-stage HF and very few, if any, options left. Will continue dobutamine at 7.5 cut norepi to 4 today and then stop soon (with no plans to titrate back up). If co-ox drops again will plan switch to comfort care. We will involve Palliative Care (daughter knows Dr. Hilma Favors well). I have contacted Dr. Hilma Favors who will  see her tomorrow.   Continue lasix 80iv bid. Add metolazone.   Will stop abx.  She is limited code. ICD is off.   The patient is critically ill with multiple organ systems failure and requires high complexity decision making for assessment and support, frequent evaluation and titration of therapies, application of advanced monitoring technologies and extensive interpretation of multiple databases.   Critical Care Time devoted to patient care services described in this note is 35 Minutes.  Bensimhon, Daniel,MD 12:37 PM Advanced Heart Failure Team Pager [phone] (M-F; 7a - 4p)  Please contact [facility] for night-coverage after hours (4p -7a ) and weekends on [email or web address]

## 2015-06-03 LAB — BASIC METABOLIC PANEL
ANION GAP: 9 (ref 5–15)
BUN: 22 mg/dL — ABNORMAL HIGH (ref 6–20)
CALCIUM: 9.1 mg/dL (ref 8.9–10.3)
CO2: 34 mmol/L — ABNORMAL HIGH (ref 22–32)
Chloride: 94 mmol/L — ABNORMAL LOW (ref 101–111)
Creatinine, Ser: 1.79 mg/dL — ABNORMAL HIGH (ref 0.44–1.00)
GFR, EST AFRICAN AMERICAN: 29 mL/min — AB (ref >60)
GFR, EST NON AFRICAN AMERICAN: 25 mL/min — AB (ref >60)
GLUCOSE: 95 mg/dL (ref 65–99)
POTASSIUM: 4.7 mmol/L (ref 3.5–5.1)
SODIUM: 137 mmol/L (ref 135–145)

## 2015-06-03 LAB — CARBOXYHEMOGLOBIN
CARBOXYHEMOGLOBIN: 1.2 % (ref 0.5–1.5)
METHEMOGLOBIN: 0.8 % (ref 0.0–1.5)
O2 Saturation: 27.1 %
TOTAL HEMOGLOBIN: 11.5 g/dL — AB (ref 12.0–16.0)

## 2015-06-03 LAB — CBC
HCT: 36.8 % (ref 36.0–46.0)
HEMOGLOBIN: 10.6 g/dL — AB (ref 12.0–15.0)
MCH: 23.9 pg — AB (ref 26.0–34.0)
MCHC: 28.8 g/dL — ABNORMAL LOW (ref 30.0–36.0)
MCV: 82.9 fL (ref 78.0–100.0)
PLATELETS: 249 10*3/uL (ref 150–400)
RBC: 4.44 MIL/uL (ref 3.87–5.11)
RDW: 20.5 % — ABNORMAL HIGH (ref 11.5–15.5)
WBC: 9.1 10*3/uL (ref 4.0–10.5)

## 2015-06-03 LAB — HEPARIN LEVEL (UNFRACTIONATED): HEPARIN UNFRACTIONATED: 0.55 [IU]/mL (ref 0.30–0.70)

## 2015-06-03 MED ORDER — BISACODYL 5 MG PO TBEC: 5.0000 mg | DELAYED_RELEASE_TABLET | Freq: Every day | ORAL | Status: DC

## 2015-06-03 MED ORDER — SODIUM CHLORIDE 0.9 % IV SOLN: INTRAVENOUS | Status: DC

## 2015-06-03 MED ORDER — POTASSIUM CHLORIDE 10 MEQ/50ML IV SOLN
10.0000 meq | INTRAVENOUS | Status: AC
  Administered 2015-06-03 (×2): 10 meq via INTRAVENOUS
  Filled 2015-06-03: qty 50

## 2015-06-03 MED ORDER — BENZONATATE 100 MG PO CAPS: 200.0000 mg | ORAL_CAPSULE | Freq: Three times a day (TID) | ORAL | Status: DC | PRN

## 2015-06-03 MED ORDER — ONDANSETRON HCL 4 MG/2ML IJ SOLN
4.0000 mg | Freq: Three times a day (TID) | INTRAMUSCULAR | Status: DC
  Administered 2015-06-03 – 2015-06-04 (×3): 4 mg via INTRAVENOUS
  Filled 2015-06-03 (×4): qty 2

## 2015-06-03 MED ORDER — MORPHINE SULFATE (PF) 2 MG/ML IV SOLN
1.0000 mg | INTRAVENOUS | Status: DC | PRN
  Administered 2015-06-03 (×3): 2 mg via INTRAVENOUS
  Administered 2015-06-03: 1 mg via INTRAVENOUS
  Administered 2015-06-04 (×2): 2 mg via INTRAVENOUS
  Administered 2015-06-04: 1 mg via INTRAVENOUS
  Administered 2015-06-04: 2 mg via INTRAVENOUS
  Filled 2015-06-03 (×8): qty 1

## 2015-06-03 NOTE — Progress Notes (Signed)
Advanced Heart Failure Rounding Note   Subjective:    PICC replaced 06/01/15.   Lethargic and minimally responsive this am. Family states she was coughing and had some alert periods through the night but mostly resting.   Remains on dobutamine 7.5. IV lasix restarted 06/01/15. Co-ox 27.1% with decrease of norepi to 2.   Weight down 3 pound. CVP 16-17. Creatinine 1.62 -> 1.79   Objective:   Weight Range:  Vital Signs:   Temp:  [97.4 F (36.3 C)-99.5 F (37.5 C)] 98.5 F (36.9 C) (05/22 0100) Pulse Rate:  [79-84] 80 (05/22 0700) Resp:  [20-40] 20 (05/22 0700) BP: (97-131)/(60-86) 101/64 mmHg (05/22 0700) SpO2:  [99 %-100 %] 100 % (05/22 0700) Weight:  [139 lb 12.4 oz (63.4 kg)] 139 lb 12.4 oz (63.4 kg) (05/22 0500) Last BM Date: 06/02/15  Weight change: Filed Weights   06/01/15 0500 06/02/15 0500 06/03/15 0500  Weight: 143 lb 4.8 oz (65 kg) 142 lb 13.7 oz (64.8 kg) 139 lb 12.4 oz (63.4 kg)    Intake/Output:   Intake/Output Summary (Last 24 hours) at 06/03/15 0716 Last data filed at 06/03/15 0500  Gross per 24 hour  Intake  658.1 ml  Output   2545 ml  Net -1886.9 ml     Physical Exam: CVP 16-17 General:In bed, chronically ill appearing.  Lethargic  HEENT: normal Neck: supple. JVP up to jaw  Carotids 2+ bilat; no bruits. No thyromegaly or nodule noted. Cor: PMI laterally displaced. regular Lungs: fine crackles  Abdomen: soft, mildly distended, non-tender,  no HSM. No bruits or masses. +BS  Extremities: no cyanosis, clubbing, rash, trace edema. Multiple tophi bilateral hands. RUE PICC Neuro: alert & oriented x 3, mildly confused. cranial nerves grossly intact. moves all 4 extremities w/o difficulty. Skin: no rash.  Telemetry: AF with v pacing in 80s  Labs: Basic Metabolic Panel:  Recent Labs Lab 05/30/15 0417 05/31/15 0418 06/01/15 0419 06/02/15 1530 06/03/15 0458  NA 131* 132* 130* 133* 137  K 4.2 3.5 3.3* 4.4 4.7  CL 93* 91* 89* 91* 94*  CO2  23 30 31 30  34*  GLUCOSE 101* 127* 234* 143* 95  BUN 56* 41* 30* 25* 22*  CREATININE 2.31* 1.90* 1.63* 1.62* 1.79*  CALCIUM 8.5* 8.6* 8.2* 8.1* 9.1    Liver Function Tests: No results for input(s): AST, ALT, ALKPHOS, BILITOT, PROT, ALBUMIN in the last 168 hours. No results for input(s): LIPASE, AMYLASE in the last 168 hours. No results for input(s): AMMONIA in the last 168 hours.  CBC:  Recent Labs Lab 05/30/15 0417 05/31/15 0417 06/01/15 0410 06/02/15 0320 06/03/15 0458  WBC 10.3 9.8 8.5 8.4 9.1  HGB 11.3* 10.5* 10.3* 10.5* 10.6*  HCT 36.9 35.2* 35.0* 36.4 36.8  MCV 78.2 78.6 79.5 81.1 82.9  PLT 190 205 213 237 249    Cardiac Enzymes: No results for input(s): CKTOTAL, CKMB, CKMBINDEX, TROPONINI in the last 168 hours.  BNP: BNP (last 3 results)  Recent Labs  02/25/15 1115 03/08/15 0120 05/16/2015 2358  BNP 1364.1* 1414.9* 1407.1*    ProBNP (last 3 results) No results for input(s): PROBNP in the last 8760 hours.    Other results:  Imaging: Dg Chest Port 1 View  06/02/2015  CLINICAL DATA:  Pulmonary edema.  Congestive heart failure. EXAM: PORTABLE CHEST 1 VIEW COMPARISON:  Jun 01, 2015 FINDINGS: Cardiomegaly is identified. Probable effusion and opacity on the left obscures the left hemidiaphragm. Pulmonary edema is also identified, similar in the  interval. The right PICC line continues to terminate in the right atrium. IMPRESSION: 1. Cardiomegaly, left effusion and opacity, and mild edema. 2. The tip of the right PICC line continues to terminate in the right atrium. Electronically Signed   By: Dorise Bullion III M.D   On: 06/02/2015 09:46   Dg Chest Port 1 View  06/01/2015  CLINICAL DATA:  Confirm central line placement. EXAM: PORTABLE CHEST 1 VIEW COMPARISON:  May 26, 2015 FINDINGS: A right PICC line terminates within the right side of the heart 4 cm below the caval atrial junction. No other venous access is identified. Mild opacity in the region of the right  fissure has decreased in the interval. Persistent effusion and opacity in left lung base is stable. The lung apices were not imaged. A 3 lead AICD device remains. Cardiomegaly is unchanged. IMPRESSION: 1. The distal tip of the right PICC line terminates in the right atrium. Recommend withdrawing 4 cm. No other venous access identified. Recommend clinical correlation. 2. Persistent effusion and underlying opacity on the left. Decreasing fluid in the right fissure. Electronically Signed   By: Dorise Bullion III M.D   On: 06/01/2015 15:25     Medications:     Scheduled Medications: . alteplase  2 mg Intracatheter Once  . fluticasone  2 spray Each Nare Daily  . furosemide  80 mg Intravenous BID  . levothyroxine  100 mcg Oral QHS  . metolazone  5 mg Oral Daily  . ondansetron (ZOFRAN) IV  4 mg Intravenous Once  . polyethylene glycol  17 g Oral Daily  . potassium chloride  20 mEq Oral BID    Infusions: . DOBUTamine 7.5 mcg/kg/min (06/01/15 0800)  . heparin 900 Units/hr (06/02/15 1156)  . norepinephrine (LEVOPHED) Adult infusion 2 mcg/min (06/02/15 1900)    PRN Medications: acetaminophen, albuterol, guaiFENesin, guaiFENesin-dextromethorphan, LORazepam, morphine injection, ondansetron (ZOFRAN) IV, senna-docusate, simethicone, sodium chloride flush, triamcinolone cream   Assessment:   1. Cardiogenic shock 2. Acute Respiratory Failure with hypoxia  3. Acute on Chronic Systolic HF on home dobutamine 4. Chronic A fib 5. A/C CKD, stage IV 6. Hyperkalemia/hyponatremia 7. Limited Code - meds only   Plan/Discussion:    Co-ox markedly low this morning with decrease of norepi.  She is pressor dependent, and unfortunately will need transition to full comfort care. Continue dobutamine 7.5 for now.     Plan on stopping nor-epi and starting on morphine drip and can increase as needed for comfort. Family wishes to wait until other family member arrives around 8 o clock to make this change and to  further discuss with Dr Hilma Favors this am. They understand that she could have hours to days once final transition to full comfort made.   Can stop non-essential meds.  Keep lasix for now. Will await palliative for further.   She is limited code. ICD is off.  Dr Hilma Favors to see today to help finalize goals of care. Knows daughter well.  Shirley Friar, PA-C 7:16 AM   Advanced Heart Failure Team Pager [phone] (M-F; 7a - 4p)  Please contact [facility] for night-coverage after hours (4p -7a ) and weekends on [email or web address]  Patient seen with PA, agree with the above note.  We are transitioning gradually to comfort measures.  We talked with patient and 2 of her daughters today and Dr Hilma Favors has been involved as well.   - Stop norepinephrine today.  - Will aim towards stopping dobutamine tomorrow.  - Stop po meds today,  she is coughing a lot with attempts to take pills.  - Morphine as needed for air hunger.   Loralie Champagne 06/03/2015 10:58 AM

## 2015-06-03 NOTE — Consult Note (Signed)
   [facility] CM Inpatient Consult   06/03/2015  ADABEL SWORDS Oct 05, 1932 [id number]   Patient screened for potential [facility] Care Management services. Chart reviewed and noted Palliative Medicine MD notes. [facility] Care Management not appropriate at this time.  Marthenia Rolling, MSN-Ed, RN,BSN [facility] Liaison [phone]

## 2015-06-03 NOTE — Consult Note (Signed)
Consultation Note Date: 06/03/2015   Patient Name: Sandra [name]  DOB: Aug 05, 1932  MRN: [id number]  Age / Sex: 82 y.o., female  PCP: Marin Olp, MD Referring Physician: Jolaine Artist, MD  Reason for Consultation: Establishing goals of care, Hospice Evaluation, Non pain symptom management and Terminal Care  HPI/Patient Profile: 82 y.o. female  with past medical history of CHF s/p ICD placement and A. Fib on home dobutamine admitted on 05/13/2015 with acute respiratory failure and worsening PNA .   Palliative care consulted for goals of care and assistance with transition to comfort care.  Clinical Assessment and Goals of Care: Met with Jocelyn Lamer and Tammy re: goals of care. Ms. Lockner is lethargic this AM, but is able to awaken to greet me - She is unable to hold detailed conversation but can answer basic questions- she tells me she is "feeling better". She tolerated morphine last PM for increased WOB along with some Ativan.  Family realistic and needing guidance through EOL process. They know she is likely facing EOL and will not survive hospitalization.   Goals: COMFORT IS PRIMARY GOAL- they desire a stepwise approach to comfort care transition They are struggling with how to handle their father(patient's spouse) he has significant vascular dementia issues with behavioral problems and is not handling this situation very well.   SUMMARY OF RECOMMENDATIONS    1. Discontinue Norepinephrine completely- major goal for [place] today- will address other interventions as we progress through the terminal process- it is important for this family to feel like they are not just "cutting their mom off" 2. We discussed timing of Dobutamine discontinuation- will maintain as long as Ms. Eilers is able to interact with her family and discontinue when death is immanent. 3. Continue to diureses as an EOL comfort measure  and long as she has urine output- ok to draw labs from PICC line only for prognostication but once organ failure evident and she is declining can stop labs completely- I encouraged family to take this one day at a time. 4. Ativan PRN for agitation 5. Itching- multifactorial: scheduled Zofran low dose TID for 5HT blocking- should be helpful. Topical [place] 6. I discontinued as many PO meds as possible- she is having a hard time swallowing larger pills especially- this will get worse as she become more lethargic.  7. Morphine PRN for now q2 hours- may or may not need a continuous infusion-for now she is very comfortable- discuss plan with RN Zenovia Jordan.   I discussed code status- DNR/comfort measures  HCPOA daughter Cyani Kallstrom   Palliative Prophylaxis:   Aspiration, Bowel Regimen, Delirium Protocol and Frequent Pain Assessment  Additional Recommendations (Limitations, Scope, Preferences):  Full Comfort Care  Psycho-social/Spiritual:   Desire for further Chaplaincy support:yes  Additional Recommendations: Grief/Bereavement Support  Prognosis:  < 2 weeks, may be much less once norepi is discontinued.  Discharge Planning: To Be Determined      Primary Diagnoses: Present on Admission:  . Hypothyroidism . Biventricular implantable cardioverter-defibrillator -St. Jude .  Chronic systolic heart failure (HCC) . Persistent atrial fibrillation (HCC) . Aspiration pneumonia (HCC) . Acute respiratory failure with hypoxia (HCC) . CKD (chronic kidney disease), stage IV (HCC) . Anxiety . Back pain . Chronic systolic CHF (congestive heart failure) (HCC) . NICM (nonischemic cardiomyopathy) (HCC) . Other specified hypothyroidism . CKD (chronic kidney disease), stage III  I have reviewed the medical record, interviewed the patient and family, and examined the patient. The following aspects are pertinent.  Past Medical History  Diagnosis Date  . Atrial fibrillation (HCC) permanent      on Tikosyn and Coumadin  . Hyperlipidemia   . Hypothyroidism   . Bradycardia   . Cardiomyopathy, hypertrophic nonobstructive (HCC)     ejection fraction 25%  . Anal fissure   . Adenomatous colon polyp   . Diverticulosis   . ICD-CRT     generator change 2013  . Systolic heart failure   . Hyperlipidemia   . Hypertension   . RBBB plus LA hemiblock   . Family history of adverse reaction to anesthesia     "middle daughter couldn't take Versed"  . CHF (congestive heart failure) (HCC)   . Varicose veins   . Chronic bronchitis (HCC)     "get it most q spring and fall" (02/06/2015)  . Migraines     "stopped after I went thru the change"  . History of gout   . Situational anxiety   . Chronic renal insufficiency, stage III (moderate)     gd 3-4  . Basal cell carcinoma     "right foot; right leg" (02/06/2015)  . Aspiration pneumonia (HCC) 03/08/2015    Hattie Perch 03/08/2015   Social History   Social History  . Marital Status: Married    Spouse Name: N/A  . Number of Children: 3  . Years of Education: N/A   Occupational History  . Retired     Engineer, petroleum   Social History Main Topics  . Smoking status: Former Smoker -- 0.20 packs/day for 19 years    Types: Cigarettes    Quit date: 01/13/1987  . Smokeless tobacco: Never Used  . Alcohol Use: No  . Drug Use: No  . Sexual Activity: No   Other Topics Concern  . None   Social History Narrative   Married 1953 (husband Felicity Pellegrini in our Financial risk analyst). 3 girls (1 is a patient here-[name]). 2 grandkids.       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 Heart disease Mother   . Colon cancer Father   . COPD Father   . Heart disease Father   . Cardiomyopathy Daughter   . Breast cancer Paternal Aunt   . Colon polyps Father   . Diabetes Neg Hx   . Kidney disease Neg Hx    Scheduled Meds: . alteplase  2 mg Intracatheter Once  .  fluticasone  2 spray Each Nare Daily  . furosemide  80 mg Intravenous BID  . levothyroxine  100 mcg Oral QHS  . ondansetron (ZOFRAN) IV  4 mg Intravenous Once  . polyethylene glycol  17 g Oral Daily  . potassium chloride  20 mEq Oral BID   Continuous Infusions: . DOBUTamine 7.5 mcg/kg/min (06/01/15 0800)  . norepinephrine (LEVOPHED) Adult infusion 2 mcg/min (06/02/15 1900)   PRN Meds:.acetaminophen, albuterol, guaiFENesin, guaiFENesin-dextromethorphan, LORazepam, morphine injection, ondansetron (ZOFRAN) IV, senna-docusate, simethicone, sodium  chloride flush, triamcinolone cream Medications Prior to Admission:  Prior to Admission medications   Medication Sig Start Date End Date Taking? Authorizing Provider  acetaminophen (TYLENOL) 325 MG tablet Take 650 mg by mouth every 6 (six) hours as needed for mild pain or headache. Reported on 03/07/2015   Yes Historical Provider, MD  apixaban (ELIQUIS) 2.5 MG TABS tablet Take 1 tablet (2.5 mg total) by mouth 2 (two) times daily. 05/23/15  Yes Shaune Pascal Bensimhon, MD  DOBUTamine (DOBUTREX) 4-5 MG/ML-% infusion Inject 264 mcg/min into the vein continuous. 05/23/15  Yes Jolaine Artist, MD  ezetimibe (ZETIA) 10 MG tablet Take 10 mg by mouth every Monday, Wednesday, and Friday.    Yes Historical Provider, MD  febuxostat (ULORIC) 40 MG tablet Take 40 mg by mouth daily.   Yes Historical Provider, MD  fluticasone (FLONASE) 50 MCG/ACT nasal spray Place 2 sprays into both nostrils daily. 09/27/14  Yes Marin Olp, MD  levothyroxine (SYNTHROID, LEVOTHROID) 100 MCG tablet Take 100 mcg by mouth at bedtime.   Yes Historical Provider, MD  potassium chloride (K-DUR,KLOR-CON) 10 MEQ tablet Take 20 mEq by mouth 2 (two) times daily.   Yes Historical Provider, MD  PROAIR HFA 108 (90 Base) MCG/ACT inhaler INHALE TWO PUFFS INTO LUNGS EVERY 6 HOURS AS NEEDED FOR WHEEZING OR  SHORTNESS OF BREATH 04/16/15  Yes Marin Olp, MD  simethicone (MYLICON) 80 MG chewable tablet  Chew 1 tablet (80 mg total) by mouth every 6 (six) hours as needed for flatulence. 03/14/15  Yes Reyne Dumas, MD  torsemide (DEMADEX) 20 MG tablet Take 2 tablets (40 mg total) by mouth 2 (two) times daily. May take an extra 9m as needed for fluid/edema. 05/23/15  Yes DJolaine Artist MD  triamcinolone cream (KENALOG) 0.1 % Apply 1 application topically 2 (two) times daily as needed (itching/rash).   Yes Historical Provider, MD  VITAMIN E PO Take 1 tablet by mouth daily.   Yes Historical Provider, MD   Allergies  Allergen Reactions  . Levofloxacin Other (See Comments)    Due to cardiac  AF  . Procaine Hcl Other (See Comments)    REACTION: smothers  . Spironolactone Diarrhea    Abdominal pain  . Amiodarone Nausea And Vomiting  . Tape Other (See Comments)    Tears skin off.  Please use "paper" tape only  . Amoxicillin Other (See Comments)    Rash   . Codeine Other (See Comments)    REACTION: nausea  . Niacin Other (See Comments)    REACTION: rash  . Ramipril Other (See Comments)    unknown  . Statins Other (See Comments)    REACTION: muscle aches   Review of Systems  Constitutional: Positive for activity change and fatigue.  Respiratory: Positive for cough, choking and shortness of breath.   Cardiovascular: Positive for chest pain.  Skin: Positive for color change and pallor.    Physical Exam  Constitutional: She appears lethargic. She appears ill. No distress.  Cardiovascular: Normal rate, S1 normal and S2 normal.  Frequent extrasystoles are present.  Pulmonary/Chest: Tachypnea noted.  Neurological: She appears lethargic.  Skin: Skin is dry. She is not diaphoretic. There is pallor.    Vital Signs: BP 101/64 mmHg  Pulse 80  Temp(Src) 98.9 F (37.2 C) (Oral)  Resp 20  Ht _0  (1.6 m)  Wt 63.4 kg (139 lb 12.4 oz)  BMI 24.77 kg/m2  SpO2 100% Pain Assessment: PAINAD   Pain Score: Asleep  SpO2: SpO2: 100 % O2 Device:SpO2: 100 % O2 Flow Rate: .O2 Flow Rate  (L/min): 3 L/min  IO: Intake/output summary:  Intake/Output Summary (Last 24 hours) at 06/03/15 0847 Last data filed at 06/03/15 0800  Gross per 24 hour  Intake  694.7 ml  Output   2545 ml  Net -1850.3 ml    LBM: Last BM Date: 06/02/15 Baseline Weight: Weight: 60.328 kg (133 lb) Most recent weight: Weight: 63.4 kg (139 lb 12.4 oz)     Palliative Assessment/Data:   30% PPS   Time In: 9AM Time Out: 10:20 Time Total: 80 min Greater than 50%  of this time was spent counseling and coordinating care related to the above assessment and plan.  Signed by: Lane Hacker, DO   Please contact Palliative Medicine Team phone at [phone] for questions and concerns.  For individual provider: See Shea Evans

## 2015-06-03 NOTE — Progress Notes (Signed)
ANTICOAGULATION CONSULT NOTE - Follow up   Pharmacy Consult for heparin Indication: atrial fibrillation  Patient Measurements: Height: 5\' 3"  (160 cm) Weight: 143 lb 4.8 oz (65 kg) IBW/kg (Calculated) : 52.4 Heparin Dosing Weight: 63  Vital Signs: Temp: 97.5 F (36.4 C) (05/20 2000) Temp Source: Axillary (05/20 2000) BP: 115/72 mmHg (05/20 1900) Pulse Rate: 84 (05/20 1900)  Labs:  Recent Labs  05/30/15 0417 05/31/15 0417 05/31/15 0418 06/01/15 0410 06/01/15 0419 06/01/15 1752 06/01/15 2115  HGB 11.3* 10.5*  --  10.3*  --   --   --   HCT 36.9 35.2*  --  35.0*  --   --   --   PLT 190 205  --  213  --   --   --   LABPROT  --   --   --   --   --  28.7*  --   INR  --   --   --   --   --  2.76*  --   HEPARINUNFRC  --   --   --   --   --   --  0.65  CREATININE 2.31*  --  1.90*  --  1.63*  --   --     Estimated Creatinine Clearance: 24.5 mL/min (by C-G formula based on Cr of 1.63).   Medical History: Past Medical History  Diagnosis Date  . Atrial fibrillation ([place]) permanent     on Tikosyn and Coumadin  . Hyperlipidemia   . Hypothyroidism   . Bradycardia   . Cardiomyopathy, hypertrophic nonobstructive (HCC)     ejection fraction 25%  . Anal fissure   . Adenomatous colon polyp   . Diverticulosis   . ICD-CRT     generator change 2013  . Systolic heart failure   . Hyperlipidemia   . Hypertension   . RBBB plus LA hemiblock   . Family history of adverse reaction to anesthesia     "middle daughter couldn't take Versed"  . CHF (congestive heart failure) ([place])   . Varicose veins   . Chronic bronchitis ([place])     "get it most q spring and fall" (02/06/2015)  . Migraines     "stopped after I went thru the change"  . History of gout   . Situational anxiety   . Chronic renal insufficiency, stage III (moderate)     gd 3-4  . Basal cell carcinoma     "right foot; right leg" (02/06/2015)  . Aspiration pneumonia ([place]) 03/08/2015    Archie Endo 03/08/2015    Medications:   Scheduled:  . alteplase  2 mg Intracatheter Once  . alteplase  2 mg Intracatheter Once  . alteplase  2 mg Intracatheter Once  . ezetimibe  10 mg Oral Q M,W,F  . fluticasone  2 spray Each Nare Daily  . furosemide  80 mg Intravenous BID  . levothyroxine  100 mcg Oral QHS  . ondansetron (ZOFRAN) IV  4 mg Intravenous Once  . piperacillin-tazobactam (ZOSYN)  IV  3.375 g Intravenous Q8H  . pneumococcal 23 valent vaccine  0.5 mL Intramuscular Tomorrow-1000  . polyethylene glycol  17 g Oral Daily  . potassium chloride  20 mEq Oral BID  . sodium chloride flush  3 mL Intravenous Q12H  . sodium chloride flush  3 mL Intravenous Q12H    Assessment: 82 yo female with hx of Afib. Was on Eliquis PTA but was initially transitioned to heparin this admission. At that time she was  therapeutic on 900 units/hr. Now to resume heparin. Hgb 10.5, PLT 237.  Heparin level was therapeutic this AM.  No bleeding or complications noted.  Goal of Therapy:  Heparin level 0.3-0.7 units/ml Monitor platelets by anticoagulation protocol: Yes   Plan:  D/c heparin for transition to comfort care.  Uvaldo Rising, BCPS  Clinical Pharmacist Pager [phone]  06/03/2015 11:10 AM

## 2015-06-04 LAB — BASIC METABOLIC PANEL
Anion gap: 11 (ref 5–15)
BUN: 33 mg/dL — ABNORMAL HIGH (ref 6–20)
CALCIUM: 9.1 mg/dL (ref 8.9–10.3)
CO2: 35 mmol/L — AB (ref 22–32)
CREATININE: 1.8 mg/dL — AB (ref 0.44–1.00)
Chloride: 88 mmol/L — ABNORMAL LOW (ref 101–111)
GFR calc non Af Amer: 25 mL/min — ABNORMAL LOW (ref >60)
GFR, EST AFRICAN AMERICAN: 29 mL/min — AB (ref >60)
Glucose, Bld: 97 mg/dL (ref 65–99)
Potassium: 4.4 mmol/L (ref 3.5–5.1)
SODIUM: 134 mmol/L — AB (ref 135–145)

## 2015-06-04 LAB — CBC
HEMATOCRIT: 35.6 % — AB (ref 36.0–46.0)
Hemoglobin: 10.1 g/dL — ABNORMAL LOW (ref 12.0–15.0)
MCH: 23.1 pg — ABNORMAL LOW (ref 26.0–34.0)
MCHC: 28.4 g/dL — AB (ref 30.0–36.0)
MCV: 81.5 fL (ref 78.0–100.0)
PLATELETS: 273 10*3/uL (ref 150–400)
RBC: 4.37 MIL/uL (ref 3.87–5.11)
RDW: 20.4 % — AB (ref 11.5–15.5)
WBC: 8.7 10*3/uL (ref 4.0–10.5)

## 2015-06-04 MED ORDER — GLYCOPYRROLATE 0.2 MG/ML IJ SOLN
0.2000 mg | INTRAMUSCULAR | Status: DC | PRN
  Administered 2015-06-04: 0.2 mg via INTRAVENOUS
  Filled 2015-06-04 (×2): qty 1

## 2015-06-04 MED ORDER — BIOTENE DRY MOUTH MT LIQD: 15.0000 mL | OROMUCOSAL | Status: DC | PRN

## 2015-06-04 MED ORDER — GLYCOPYRROLATE 1 MG PO TABS
1.0000 mg | ORAL_TABLET | ORAL | Status: DC | PRN
  Filled 2015-06-04: qty 1

## 2015-06-04 MED ORDER — MORPHINE BOLUS VIA INFUSION
2.0000 mg | INTRAVENOUS | Status: DC | PRN
  Filled 2015-06-04: qty 2

## 2015-06-04 MED ORDER — ACETAMINOPHEN 650 MG RE SUPP: 650.0000 mg | Freq: Four times a day (QID) | RECTAL | Status: DC | PRN

## 2015-06-04 MED ORDER — ACETAMINOPHEN 325 MG PO TABS: 650.0000 mg | ORAL_TABLET | Freq: Four times a day (QID) | ORAL | Status: DC | PRN

## 2015-06-04 MED ORDER — SODIUM CHLORIDE 0.9 % IV SOLN
INTRAVENOUS | Status: DC
  Administered 2015-06-04: 11:00:00 via INTRAVENOUS

## 2015-06-04 MED ORDER — MORPHINE BOLUS VIA INFUSION
4.0000 mg | INTRAVENOUS | Status: DC | PRN
  Filled 2015-06-04 (×2): qty 4

## 2015-06-04 MED ORDER — SODIUM CHLORIDE 0.9 % IV SOLN
2.0000 mg/h | INTRAVENOUS | Status: DC
  Administered 2015-06-04: 2 mg/h via INTRAVENOUS
  Filled 2015-06-04: qty 10

## 2015-06-04 MED ORDER — GLYCOPYRROLATE 0.2 MG/ML IJ SOLN
0.2000 mg | INTRAMUSCULAR | Status: DC | PRN
  Filled 2015-06-04: qty 1

## 2015-06-04 MED ORDER — POLYVINYL ALCOHOL 1.4 % OP SOLN
1.0000 [drp] | Freq: Four times a day (QID) | OPHTHALMIC | Status: DC | PRN
  Filled 2015-06-04: qty 15

## 2015-06-11 ENCOUNTER — Encounter (HOSPITAL_COMMUNITY): Payer: Self-pay

## 2015-06-11 NOTE — Progress Notes (Signed)
Death certificate received on behalf of patient from [place] and Barbarann Ehlers. Will have Dr. Haroldine Laws complete and send back tomrorow.  Renee Pain

## 2015-06-12 ENCOUNTER — Telehealth (HOSPITAL_COMMUNITY): Payer: Self-pay | Admitting: *Deleted

## 2015-06-12 NOTE — Telephone Encounter (Signed)
DC completed and signed by Dr Haroldine Laws, mailed to [facility] Dept

## 2015-06-13 ENCOUNTER — Ambulatory Visit: Payer: Medicare Other | Admitting: Family Medicine

## 2015-06-13 NOTE — Discharge Summary (Signed)
  Advanced Heart Failure Discharge Note  Death Summary   Patient ID: Sandra [name] MRN: [id number], DOB/AGE: 1932/07/22 82 y.o. Admit date: 05/27/2015 D/C date:     2015-06-09   Primary Discharge Diagnoses:  1. Cardiogenic shock 2. Acute Respiratory Failure with hypoxia  3. Acute on Chronic Systolic HF due to NICM on home dobutamine 4. Chronic A fib 5. A/C CKD, stage IV 6. Hyperkalemia/hyponatremia  Consulting: IM, Palliative Care, [facility]  Hospital Course:    Sandra [name] was a 82 y.o. female with h/o PAF, hypothyroidism, St Jude CRT-D CKD stage 4 and chronic systolic HF due to NICM with EF 25%.  Found to have cardiogenic shock physiology on RHC in 10/16 in setting of permanent AF. Initially treated with milrinone but had poor response. Switched to dobutamine. Also underwent AVN ablation on 02/06/15 with BiV pacing to try to optimize HF. Was relatively stable NYHA III on home dobutamine,   Admitted 06/08/2015 with pleuritic chest pain, fever, and productive cough. CXR with LLL PNA so she was started on azithro and ceftriaxone. Developed worsening fatigue and progressive renal failure so AHF team consulted on 05/28/15. Found to have coox 25%, consistent with profound cardiogenic shock.  Pt moved to ICU and started on levophed. ABX coverage expanded to cover possible aspiration PNA.  Coox transiently improved with norepi 10 and dobutamine 7.5. Slow wean of norepi begun and co-ox dropped dramatically into the 20-30s consistent with end-stage HF Goals of care were discussed with her family. Made DNR and ICD deactivated. Dr. Hilma Favors from [facility] consulted.  On the am of 06/03/15, patient was more lethargic and uncomfortable. Low-dose morphine started. Patient with continue deterioration on 5/23 and morphine titrated for comfort. She passed without incident within several hours of being placed on morphine gtt with family at bedside.   Disposition   The patient has passed away.         Duration of Discharge Encounter: Greater than 35 minutes   Signed, Shirley Friar PA-C 06/05/2015, 3:53 PM  Agree with above.   Taite Baldassari,MD 7:34 PM

## 2015-06-13 NOTE — Progress Notes (Signed)
Administered 4 mg morphine at this time per Dr Haroldine Laws.

## 2015-06-13 NOTE — Progress Notes (Signed)
   2015/06/22 1200  Clinical Encounter Type  Visited With Family  Visit Type Initial;Spiritual support;Psychological support;Social support;Patient actively dying  Referral From Palliative care team  Spiritual Encounters  Spiritual Needs Grief support;Emotional  On request from Dr. Hilma Favors, [facility] visited with pt family at bedside, introducing [place] and functions; [place] offered social, emotional and spiritual support.  [place] available as needed.  Gwynn Burly 12:04 PM

## 2015-06-13 NOTE — Progress Notes (Signed)
   2015/07/02 1200  Clinical Encounter Type  Visited With Family  Visit Type Initial;Spiritual support;Psychological support;Social support;Patient actively dying  Referral From Palliative care team  Spiritual Encounters  Spiritual Needs Grief support;Emotional  [place] and intern visited with family and MD at bedside offering support.  [place] available as needed. Gwynn Burly 4:17 PM

## 2015-06-13 NOTE — Progress Notes (Signed)
Advanced Heart Failure Rounding Note   Subjective:    PICC replaced 06/01/15.   More coughing and in pain this morning. Back pain bothering her mostly. More uncomfortable this morning.   Remains on dobutamine 7.5. IV lasix restarted 06/01/15. No coox this morning. Norepi stopped yesterday.  Weight down 1 pound.  No CVP hooked up. Creatinine 1.62 -> 1.79 -> 1.80.   Objective:   Weight Range:  Vital Signs:   Temp:  [97.2 F (36.2 C)-98.9 F (37.2 C)] 97.6 F (36.4 C) (05/23 0530) Pulse Rate:  [47-83] 83 (05/23 0600) Resp:  [10-33] 18 (05/23 0600) BP: (102-124)/(70-86) 110/80 mmHg (05/23 0530) SpO2:  [98 %-100 %] 100 % (05/23 0600) Weight:  [138 lb 14.2 oz (63 kg)] 138 lb 14.2 oz (63 kg) (05/23 0500) Last BM Date: 06/02/15  Weight change: Filed Weights   06/02/15 0500 06/03/15 0500 2015-07-03 0500  Weight: 142 lb 13.7 oz (64.8 kg) 139 lb 12.4 oz (63.4 kg) 138 lb 14.2 oz (63 kg)    Intake/Output:   Intake/Output Summary (Last 24 hours) at 07-03-15 0737 Last data filed at Jul 03, 2015 0700  Gross per 24 hour  Intake  415.5 ml  Output   2000 ml  Net -1584.5 ml     Physical Exam: General:In bed, chronically ill appearing.  Lethargic , Uncomfortable HEENT: normal Neck: supple. JVP up to jaw  Carotids 2+ bilat; no bruits. No thyromegaly or nodule noted. Cor: PMI laterally displaced. regular Lungs: fine crackles, diminished throughout Abdomen: soft, mildly distended, non-tender,  no HSM. No bruits or masses. +BS  Extremities: no cyanosis, clubbing, rash, trace - 1+ edema. Multiple tophi bilateral hands. RUE PICC Neuro: alert & oriented x 3, mildly confused. cranial nerves grossly intact. moves all 4 extremities w/o difficulty. Skin: no rash.  Telemetry: Reviewed, AF with v pacing in 80s  Labs: Basic Metabolic Panel:  Recent Labs Lab 05/31/15 0418 06/01/15 0419 06/02/15 1530 06/03/15 0458 07-03-2015 0519  NA 132* 130* 133* 137 134*  K 3.5 3.3* 4.4 4.7 4.4  CL  91* 89* 91* 94* 88*  CO2 30 31 30  34* 35*  GLUCOSE 127* 234* 143* 95 97  BUN 41* 30* 25* 22* 33*  CREATININE 1.90* 1.63* 1.62* 1.79* 1.80*  CALCIUM 8.6* 8.2* 8.1* 9.1 9.1    Liver Function Tests: No results for input(s): AST, ALT, ALKPHOS, BILITOT, PROT, ALBUMIN in the last 168 hours. No results for input(s): LIPASE, AMYLASE in the last 168 hours. No results for input(s): AMMONIA in the last 168 hours.  CBC:  Recent Labs Lab 05/31/15 0417 06/01/15 0410 06/02/15 0320 06/03/15 0458 2015-07-03 0519  WBC 9.8 8.5 8.4 9.1 8.7  HGB 10.5* 10.3* 10.5* 10.6* 10.1*  HCT 35.2* 35.0* 36.4 36.8 35.6*  MCV 78.6 79.5 81.1 82.9 81.5  PLT 205 213 237 249 273    Cardiac Enzymes: No results for input(s): CKTOTAL, CKMB, CKMBINDEX, TROPONINI in the last 168 hours.  BNP: BNP (last 3 results)  Recent Labs  02/25/15 1115 03/08/15 0120 06/03/2015 2358  BNP 1364.1* 1414.9* 1407.1*    ProBNP (last 3 results) No results for input(s): PROBNP in the last 8760 hours.    Other results:  Imaging: No results found.   Medications:     Scheduled Medications: . alteplase  2 mg Intracatheter Once  . fluticasone  2 spray Each Nare Daily  . furosemide  80 mg Intravenous BID  . ondansetron (ZOFRAN) IV  4 mg Intravenous Q8H    Infusions: .  sodium chloride    . DOBUTamine 7.5 mcg/kg/min (06/01/15 0800)    PRN Medications: acetaminophen, albuterol, benzonatate, LORazepam, morphine injection, sodium chloride flush, triamcinolone cream   Assessment:   1. Cardiogenic shock 2. Acute Respiratory Failure with hypoxia  3. Acute on Chronic Systolic HF on home dobutamine 4. Chronic A fib 5. A/C CKD, stage IV 6. Hyperkalemia/hyponatremia 7. Limited Code - meds only   Plan/Discussion:    Co-ox pending. Off norepi. Will decrease dobutamine to 5 and plan on stopping today.      Off non-essential meds.  Keep lasix for now as palliative measure.   Morphine current q2 hrs, and she is  requesting it around or sooner than these times.  She is more uncomfortable this morning.   Increase morphine as needed. Have low threshold to transition to morphine gtt with her level of discomfort.   With coming down on dobutamine and increasing morphine, suspect she will pass in the next 24-48 hours.   She is now DNR. ICD is off. We appreciate palliative input.   Shirley Friar, PA-C 7:37 AM   Advanced Heart Failure Team Pager [phone] (M-F; 7a - 4p)  Please contact [facility] for night-coverage after hours (4p -7a ) and weekends on [email or web address]  Patient seen and examined with Oda Kilts, PA-C. We discussed all aspects of the encounter. I agree with the assessment and plan as stated above.   Sandra [name] is actively dying from HF. Long discussion with her and her family. She is very uncomfortable. We have started a morphine gtt and will titrate to comfort. Her family is aware that she will likely pass today.  Cap Massi,MD 2:39 PM

## 2015-06-13 DEATH — deceased

## 2015-06-18 ENCOUNTER — Ambulatory Visit: Payer: Medicare Other | Admitting: Family Medicine

## 2015-07-02 ENCOUNTER — Encounter (HOSPITAL_COMMUNITY): Payer: Medicare Other | Admitting: Internal Medicine

## 2015-07-15 ENCOUNTER — Encounter: Payer: Medicare Other | Admitting: Internal Medicine

## 2017-09-04 IMAGING — CT CT MAXILLOFACIAL W/O CM
1 series · 15 of 30 positions shown, 19 images · non-contrast
Comparison: No priors.

CLINICAL DATA: 82-year-old female with history of trauma from a
fall this morning. Injury to her head on a door, with bruising to
the right supraorbital region.

EXAM:
CT HEAD WITHOUT CONTRAST
CT MAXILLOFACIAL WITHOUT CONTRAST
TECHNIQUE: Multidetector CT imaging of the head and maxillofacial structures
were performed using the standard protocol without intravenous
contrast. Multiplanar CT image reconstructions of the maxillofacial
structures were also generated.

[Series 2: head 4.8 h37s · axial · 0.44mm/px · z∈[-186,-34]mm · 15 of 36 slices shown, 19 images]
[im 2/36  brain]
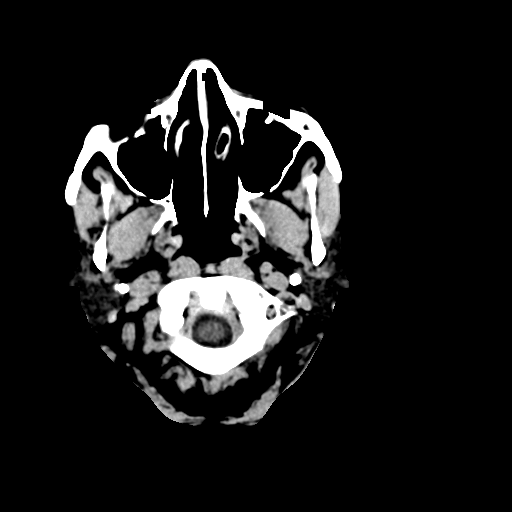
[im 2/36  bone]
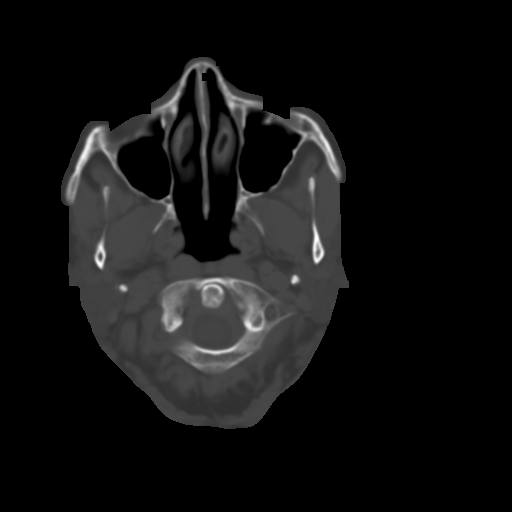
[im 4/36  bone]
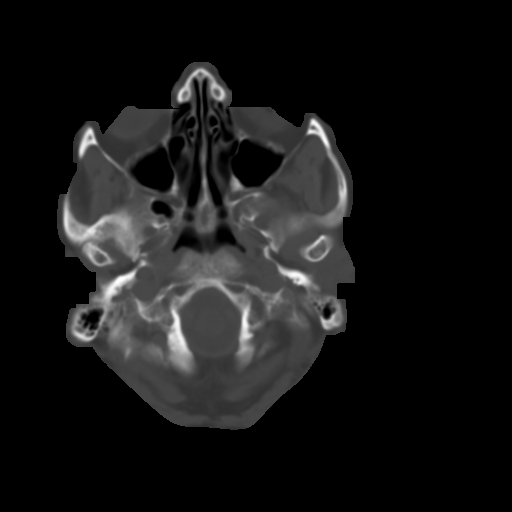
[im 7/36  bone]
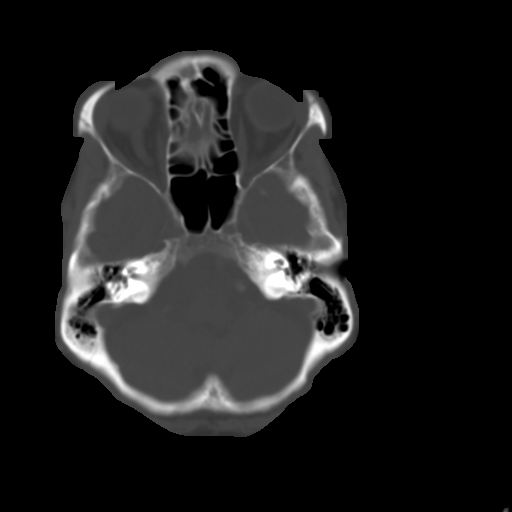
[im 9/36  bone]
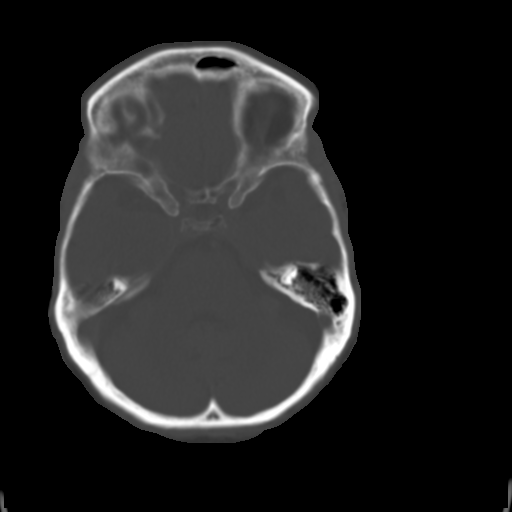
[im 11/36  brain]
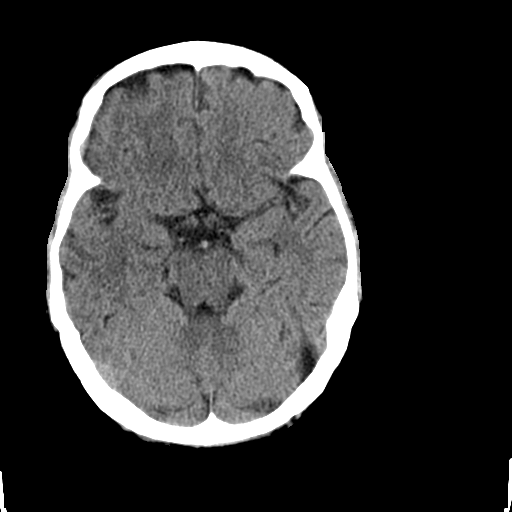
[im 11/36  bone]
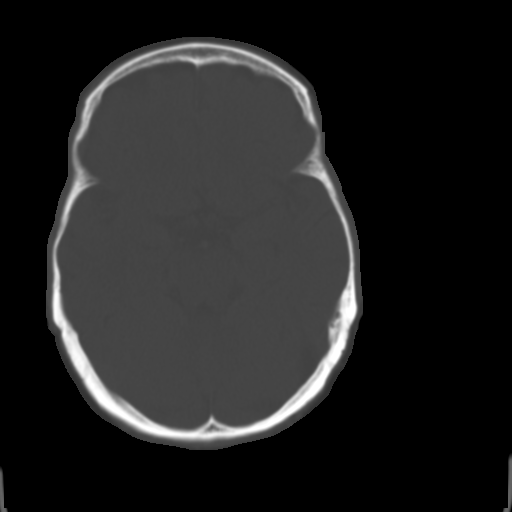
[im 14/36  bone]
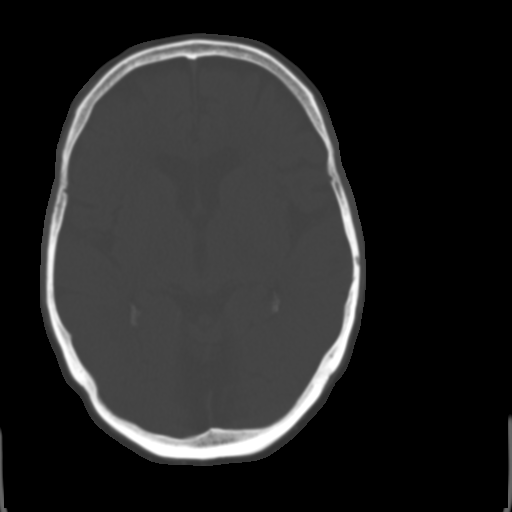
[im 16/36  bone]
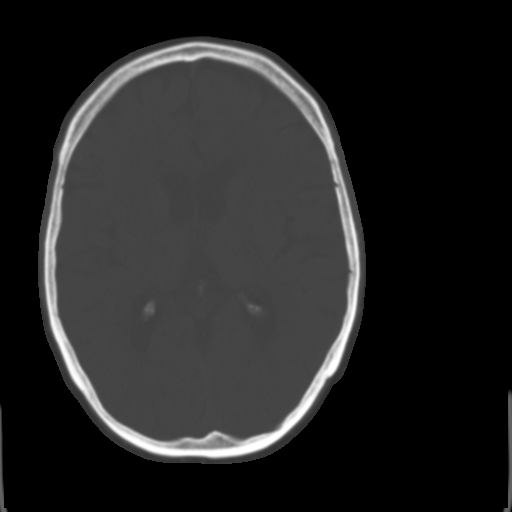
[im 19/36  bone]
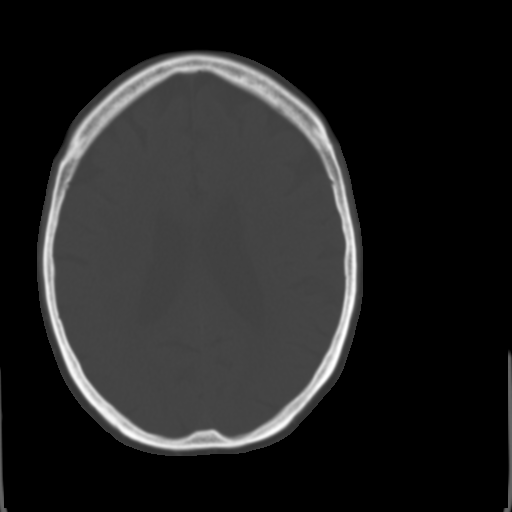
[im 20/36  brain]
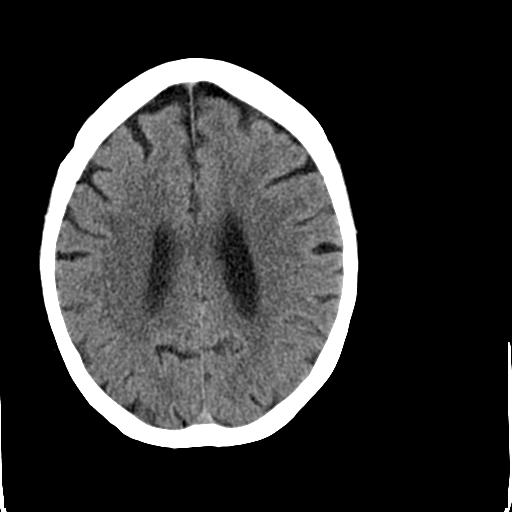
[im 20/36  bone]
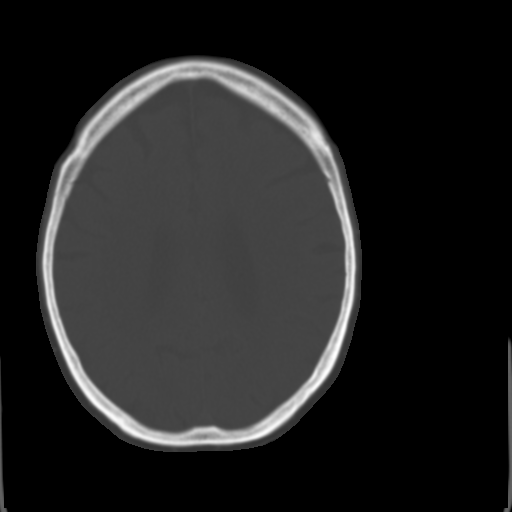
[im 22/36  bone]
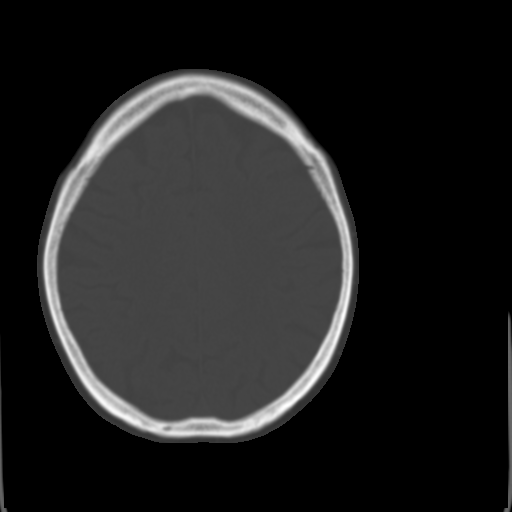
[im 25/36  bone]
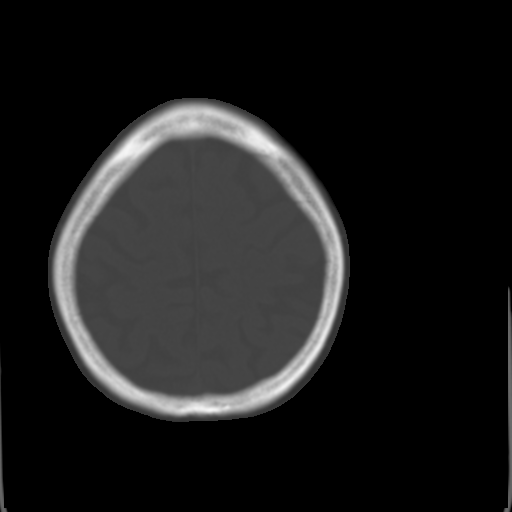
[im 27/36  bone]
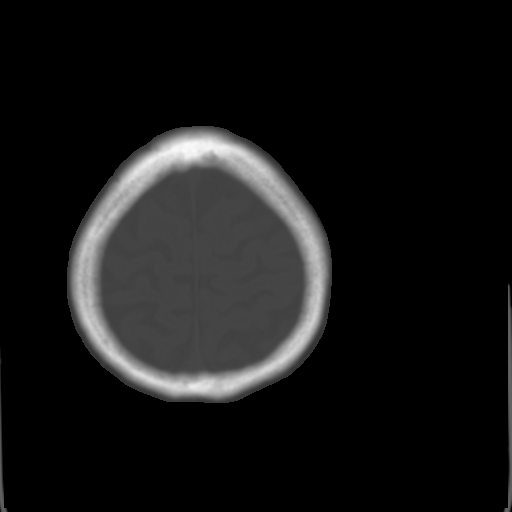
[im 29/36  brain]
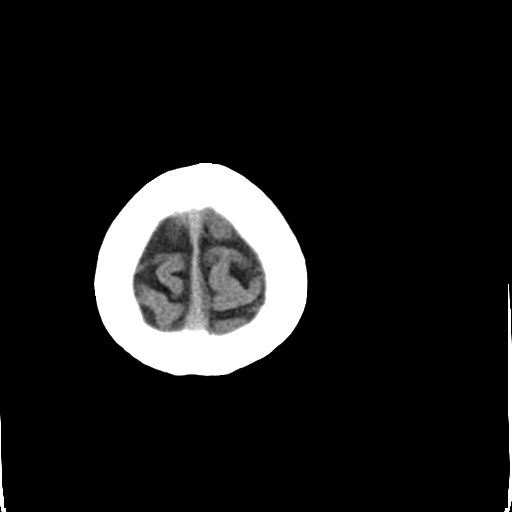
[im 29/36  bone]
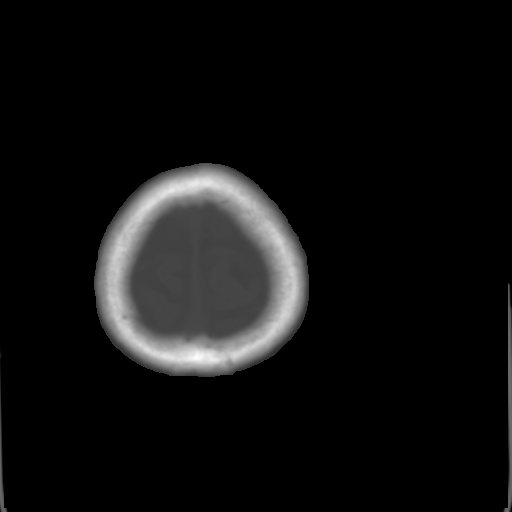
[im 32/36  bone]
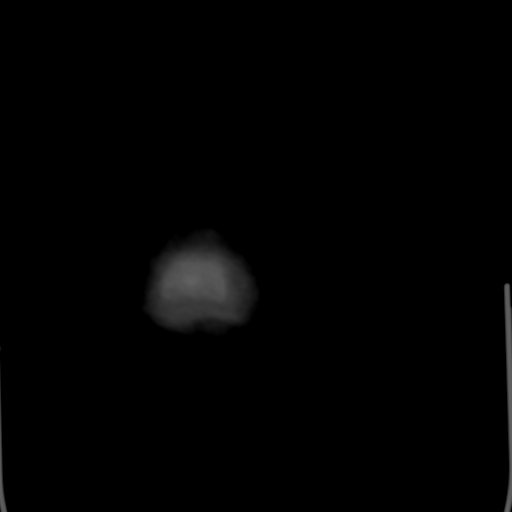
[im 34/36  bone]
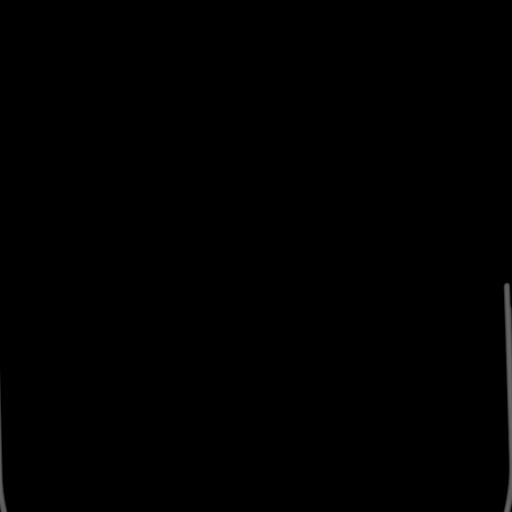

[15 of 30 positions shown; findings below may reference images not displayed]

FINDINGS: CT HEAD FINDINGS

Mild cerebral atrophy. Patchy and confluent areas of decreased
attenuation are noted throughout the deep and periventricular white
matter of the cerebral hemispheres bilaterally, compatible with
chronic microvascular ischemic disease. No acute displaced skull
fractures are identified. No acute intracranial abnormality.
Specifically, no evidence of acute post-traumatic intracranial
hemorrhage, no definite regions of acute/subacute cerebral ischemia,
no focal mass, mass effect, hydrocephalus or abnormal intra or
extra-axial fluid collections. The visualized paranasal sinuses and
mastoids are well pneumatized.

CT MAXILLOFACIAL FINDINGS

No acute displaced facial bone fractures. Specifically, pterygoid
plates are intact. The mandible is intact, and mandibular condyles
are located bilaterally. Bilateral globes and retro-orbital soft
tissues are grossly normal.
IMPRESSION: 1. No evidence of significant acute traumatic skull, brain or facial
bones.
2. Mild cerebral atrophy with mild chronic microvascular ischemic
changes in the cerebral white matter.
